# Patient Record
Sex: Female | Born: 1960
Health system: Southern US, Community
[De-identification: ages and names within clinical notes are randomized; demographics above are authoritative.]

## PROBLEM LIST (undated history)

## (undated) DIAGNOSIS — R569 Unspecified convulsions: Secondary | ICD-10-CM

## (undated) DIAGNOSIS — M419 Scoliosis, unspecified: Secondary | ICD-10-CM

## (undated) DIAGNOSIS — I639 Cerebral infarction, unspecified: Secondary | ICD-10-CM

## (undated) DIAGNOSIS — T7840XA Allergy, unspecified, initial encounter: Secondary | ICD-10-CM

## (undated) DIAGNOSIS — E039 Hypothyroidism, unspecified: Secondary | ICD-10-CM

## (undated) DIAGNOSIS — S92901A Unspecified fracture of right foot, initial encounter for closed fracture: Secondary | ICD-10-CM

## (undated) DIAGNOSIS — F329 Major depressive disorder, single episode, unspecified: Secondary | ICD-10-CM

## (undated) DIAGNOSIS — R011 Cardiac murmur, unspecified: Secondary | ICD-10-CM

## (undated) DIAGNOSIS — I77810 Thoracic aortic ectasia: Secondary | ICD-10-CM

## (undated) DIAGNOSIS — F419 Anxiety disorder, unspecified: Secondary | ICD-10-CM

## (undated) DIAGNOSIS — F32A Depression, unspecified: Secondary | ICD-10-CM

## (undated) DIAGNOSIS — G8929 Other chronic pain: Secondary | ICD-10-CM

## (undated) DIAGNOSIS — M1990 Unspecified osteoarthritis, unspecified site: Secondary | ICD-10-CM

## (undated) DIAGNOSIS — J45909 Unspecified asthma, uncomplicated: Secondary | ICD-10-CM

## (undated) DIAGNOSIS — E7800 Pure hypercholesterolemia, unspecified: Secondary | ICD-10-CM

## (undated) DIAGNOSIS — I10 Essential (primary) hypertension: Secondary | ICD-10-CM

## (undated) DIAGNOSIS — IMO0002 Reserved for concepts with insufficient information to code with codable children: Secondary | ICD-10-CM

## (undated) DIAGNOSIS — M719 Bursopathy, unspecified: Secondary | ICD-10-CM

## (undated) DIAGNOSIS — N289 Disorder of kidney and ureter, unspecified: Secondary | ICD-10-CM

## (undated) DIAGNOSIS — M549 Dorsalgia, unspecified: Secondary | ICD-10-CM

## (undated) DIAGNOSIS — D649 Anemia, unspecified: Secondary | ICD-10-CM

## (undated) DIAGNOSIS — I209 Angina pectoris, unspecified: Secondary | ICD-10-CM

## (undated) HISTORY — DX: Unspecified osteoarthritis, unspecified site: M19.90

## (undated) HISTORY — DX: Bursopathy, unspecified: M71.9

## (undated) HISTORY — DX: Cardiac murmur, unspecified: R01.1

## (undated) HISTORY — DX: Anxiety disorder, unspecified: F41.9

## (undated) HISTORY — PX: ABDOMINAL HYSTERECTOMY: SHX81

## (undated) HISTORY — DX: Allergy, unspecified, initial encounter: T78.40XA

## (undated) HISTORY — DX: Anemia, unspecified: D64.9

## (undated) HISTORY — DX: Thoracic aortic ectasia: I77.810

## (undated) HISTORY — PX: HAND SURGERY: SHX662

## (undated) HISTORY — PX: OTHER SURGICAL HISTORY: SHX169

---

## 1998-02-15 ENCOUNTER — Encounter: Payer: Self-pay | Admitting: Emergency Medicine

## 1998-02-15 ENCOUNTER — Emergency Department (HOSPITAL_COMMUNITY): Admission: EM | Admit: 1998-02-15 | Discharge: 1998-02-15 | Payer: Self-pay | Admitting: Emergency Medicine

## 1998-03-14 ENCOUNTER — Emergency Department (HOSPITAL_COMMUNITY): Admission: EM | Admit: 1998-03-14 | Discharge: 1998-03-14 | Payer: Self-pay | Admitting: Emergency Medicine

## 1998-03-18 ENCOUNTER — Emergency Department (HOSPITAL_COMMUNITY): Admission: EM | Admit: 1998-03-18 | Discharge: 1998-03-18 | Payer: Self-pay

## 1998-08-25 ENCOUNTER — Emergency Department (HOSPITAL_COMMUNITY): Admission: EM | Admit: 1998-08-25 | Discharge: 1998-08-25 | Payer: Self-pay | Admitting: Emergency Medicine

## 1998-08-25 ENCOUNTER — Encounter: Payer: Self-pay | Admitting: Emergency Medicine

## 1998-11-10 ENCOUNTER — Ambulatory Visit (HOSPITAL_COMMUNITY): Admission: RE | Admit: 1998-11-10 | Discharge: 1998-11-10 | Payer: Self-pay | Admitting: Gastroenterology

## 1998-11-16 ENCOUNTER — Other Ambulatory Visit: Admission: RE | Admit: 1998-11-16 | Discharge: 1998-11-16 | Payer: Self-pay | Admitting: *Deleted

## 1999-02-05 ENCOUNTER — Encounter: Payer: Self-pay | Admitting: Emergency Medicine

## 1999-02-05 ENCOUNTER — Emergency Department (HOSPITAL_COMMUNITY): Admission: EM | Admit: 1999-02-05 | Discharge: 1999-02-05 | Payer: Self-pay | Admitting: Emergency Medicine

## 1999-12-17 ENCOUNTER — Other Ambulatory Visit: Admission: RE | Admit: 1999-12-17 | Discharge: 1999-12-17 | Payer: Self-pay | Admitting: Obstetrics

## 1999-12-17 ENCOUNTER — Encounter (HOSPITAL_COMMUNITY): Admission: AD | Admit: 1999-12-17 | Discharge: 2000-03-16 | Payer: Self-pay | Admitting: Obstetrics

## 1999-12-18 ENCOUNTER — Other Ambulatory Visit: Admission: RE | Admit: 1999-12-18 | Discharge: 1999-12-18 | Payer: Self-pay | Admitting: Obstetrics

## 1999-12-18 ENCOUNTER — Encounter (INDEPENDENT_AMBULATORY_CARE_PROVIDER_SITE_OTHER): Payer: Self-pay | Admitting: Specialist

## 1999-12-20 ENCOUNTER — Encounter: Payer: Self-pay | Admitting: Obstetrics

## 1999-12-20 ENCOUNTER — Ambulatory Visit (HOSPITAL_COMMUNITY): Admission: RE | Admit: 1999-12-20 | Discharge: 1999-12-20 | Payer: Self-pay | Admitting: Obstetrics

## 2000-03-17 ENCOUNTER — Encounter (HOSPITAL_COMMUNITY): Admission: AD | Admit: 2000-03-17 | Discharge: 2000-06-15 | Payer: Self-pay | Admitting: Obstetrics

## 2000-07-29 ENCOUNTER — Emergency Department (HOSPITAL_COMMUNITY): Admission: EM | Admit: 2000-07-29 | Discharge: 2000-07-29 | Payer: Self-pay | Admitting: Emergency Medicine

## 2000-07-29 ENCOUNTER — Encounter: Payer: Self-pay | Admitting: Emergency Medicine

## 2000-11-11 ENCOUNTER — Encounter: Payer: Self-pay | Admitting: Family Medicine

## 2000-11-11 ENCOUNTER — Encounter: Admission: RE | Admit: 2000-11-11 | Discharge: 2000-11-11 | Payer: Self-pay | Admitting: Family Medicine

## 2000-12-24 ENCOUNTER — Encounter: Admission: RE | Admit: 2000-12-24 | Discharge: 2001-02-06 | Payer: Self-pay | Admitting: Orthopedic Surgery

## 2001-01-05 ENCOUNTER — Other Ambulatory Visit: Admission: RE | Admit: 2001-01-05 | Discharge: 2001-01-05 | Payer: Self-pay | Admitting: Obstetrics and Gynecology

## 2001-02-06 ENCOUNTER — Encounter: Payer: Self-pay | Admitting: *Deleted

## 2001-02-06 ENCOUNTER — Ambulatory Visit (HOSPITAL_COMMUNITY): Admission: RE | Admit: 2001-02-06 | Discharge: 2001-02-06 | Payer: Self-pay | Admitting: *Deleted

## 2001-03-05 ENCOUNTER — Encounter: Payer: Self-pay | Admitting: Emergency Medicine

## 2001-03-05 ENCOUNTER — Emergency Department (HOSPITAL_COMMUNITY): Admission: EM | Admit: 2001-03-05 | Discharge: 2001-03-05 | Payer: Self-pay | Admitting: Emergency Medicine

## 2001-09-25 ENCOUNTER — Emergency Department (HOSPITAL_COMMUNITY): Admission: EM | Admit: 2001-09-25 | Discharge: 2001-09-26 | Payer: Self-pay | Admitting: Emergency Medicine

## 2001-11-20 ENCOUNTER — Emergency Department (HOSPITAL_COMMUNITY): Admission: EM | Admit: 2001-11-20 | Discharge: 2001-11-20 | Payer: Self-pay | Admitting: Emergency Medicine

## 2001-11-26 ENCOUNTER — Emergency Department (HOSPITAL_COMMUNITY): Admission: EM | Admit: 2001-11-26 | Discharge: 2001-11-26 | Payer: Self-pay | Admitting: Emergency Medicine

## 2001-12-10 ENCOUNTER — Encounter: Admission: RE | Admit: 2001-12-10 | Discharge: 2001-12-23 | Payer: Self-pay | Admitting: Orthopedic Surgery

## 2002-01-23 ENCOUNTER — Encounter: Payer: Self-pay | Admitting: Emergency Medicine

## 2002-01-23 ENCOUNTER — Emergency Department (HOSPITAL_COMMUNITY): Admission: EM | Admit: 2002-01-23 | Discharge: 2002-01-24 | Payer: Self-pay | Admitting: Emergency Medicine

## 2002-02-04 ENCOUNTER — Other Ambulatory Visit: Admission: RE | Admit: 2002-02-04 | Discharge: 2002-02-04 | Payer: Self-pay | Admitting: Obstetrics and Gynecology

## 2002-03-04 ENCOUNTER — Encounter: Payer: Self-pay | Admitting: Internal Medicine

## 2002-03-04 ENCOUNTER — Ambulatory Visit (HOSPITAL_COMMUNITY): Admission: RE | Admit: 2002-03-04 | Discharge: 2002-03-04 | Payer: Self-pay | Admitting: Internal Medicine

## 2002-04-05 ENCOUNTER — Emergency Department (HOSPITAL_COMMUNITY): Admission: EM | Admit: 2002-04-05 | Discharge: 2002-04-06 | Payer: Self-pay | Admitting: Emergency Medicine

## 2002-04-07 ENCOUNTER — Inpatient Hospital Stay (HOSPITAL_COMMUNITY): Admission: AD | Admit: 2002-04-07 | Discharge: 2002-04-07 | Payer: Self-pay | Admitting: Obstetrics and Gynecology

## 2002-04-09 ENCOUNTER — Emergency Department (HOSPITAL_COMMUNITY): Admission: EM | Admit: 2002-04-09 | Discharge: 2002-04-09 | Payer: Self-pay | Admitting: Emergency Medicine

## 2002-04-12 ENCOUNTER — Ambulatory Visit (HOSPITAL_COMMUNITY): Admission: RE | Admit: 2002-04-12 | Discharge: 2002-04-12 | Payer: Self-pay | Admitting: Obstetrics and Gynecology

## 2002-04-12 ENCOUNTER — Encounter: Payer: Self-pay | Admitting: Obstetrics and Gynecology

## 2002-05-11 ENCOUNTER — Encounter (INDEPENDENT_AMBULATORY_CARE_PROVIDER_SITE_OTHER): Payer: Self-pay | Admitting: *Deleted

## 2002-05-11 ENCOUNTER — Inpatient Hospital Stay (HOSPITAL_COMMUNITY): Admission: RE | Admit: 2002-05-11 | Discharge: 2002-05-14 | Payer: Self-pay | Admitting: Obstetrics and Gynecology

## 2002-05-11 ENCOUNTER — Encounter: Payer: Self-pay | Admitting: Obstetrics and Gynecology

## 2002-08-24 ENCOUNTER — Ambulatory Visit (HOSPITAL_COMMUNITY): Admission: RE | Admit: 2002-08-24 | Discharge: 2002-08-24 | Payer: Self-pay | Admitting: Obstetrics and Gynecology

## 2002-08-24 ENCOUNTER — Encounter: Payer: Self-pay | Admitting: Obstetrics and Gynecology

## 2003-06-02 ENCOUNTER — Other Ambulatory Visit: Admission: RE | Admit: 2003-06-02 | Discharge: 2003-06-02 | Payer: Self-pay | Admitting: Obstetrics and Gynecology

## 2008-09-29 ENCOUNTER — Emergency Department (HOSPITAL_COMMUNITY): Admission: EM | Admit: 2008-09-29 | Discharge: 2008-09-29 | Payer: Self-pay | Admitting: Emergency Medicine

## 2008-10-10 ENCOUNTER — Emergency Department (HOSPITAL_COMMUNITY): Admission: EM | Admit: 2008-10-10 | Discharge: 2008-10-10 | Payer: Self-pay | Admitting: Family Medicine

## 2009-02-18 ENCOUNTER — Emergency Department (HOSPITAL_COMMUNITY): Admission: EM | Admit: 2009-02-18 | Discharge: 2009-02-18 | Payer: Self-pay | Admitting: Family Medicine

## 2010-07-08 LAB — GLUCOSE, CAPILLARY

## 2010-08-17 NOTE — Op Note (Signed)
NAME:  Lori Jones, Lori Jones                         ACCOUNT NO.:  [id number]   MEDICAL RECORD NO.:  [id number]                   PATIENT TYPE:  OBV   LOCATION:  9303                                 [facility]   PHYSICIAN:  Hal Morales, M.D.             DATE OF BIRTH:  December 31, 1960   DATE OF PROCEDURE:  05/11/2002  DATE OF DISCHARGE:                                 OPERATIVE REPORT   PREOPERATIVE DIAGNOSES:  1. Uterine fibroids.  2. Menorrhagia.  3. Pelvic pain.  4. Anemia.  5. Bilateral pelvic kidneys.   POSTOPERATIVE DIAGNOSES:  1. Uterine fibroids.  2. Pelvic pain.  3. Menorrhagia.  4. Anemia.  5. Endometriosis.  6. Bilateral pelvic kidneys.   OPERATION:  Laparoscopy and total abdominal hysterectomy after placement of  ureteral stents, then removal of ureteral stents.   SURGEON:  Hal Morales, M.D.   FIRST ASSISTANT:  Elmira J. Lowell Guitar, P.A.   UROLOGIC CONSULTANT:  Lindaann Slough, M.D.   ANESTHESIA:  General orotracheal.   ESTIMATED BLOOD LOSS:  400 mL.   COMPLICATIONS:  None.   FINDINGS:  The uterus was enlarged to approximately 14 weeks size with  multiple myomata.  The ovaries were normal sized bilaterally but contained  powder burn lesions consistent with endometriosis.  The anterior cul-de-sac  likewise contained powder burn lesions consistent with endometriosis.   DESCRIPTION OF PROCEDURE:  The patient was taken to the operating room after  appropriate identification and placed on the operating table.  While she was  awake she was placed in the modified lithotomy position because of a history  of significant back pain.  She was then placed under general anesthesia and  the abdomen, perineum, and vagina prepped with multiple layers of Betadine.  The perineum was then draped as a sterile field and Dr. Su Grand undertook  cystoscopy and cystoscopic placement of ureteral catheters.  Once the  ureteral catheters had been placed a Foley catheter was  placed and a Hulka  tenaculum placed on the cervix.  The abdomen and perineum were then  re-draped in an LAVH drape and subumbilical injection of 0.25% Marcaine as  well as suprapubic injection on either side of midline undertaken for a  total of 10 mL.  A subumbilical incision was made and a Veress cannula  placed through that incision into the peritoneal cavity.  A pneumoperitoneum  was created with 3 liters of CO2.  The Veress cannula was removed and the  laparoscopic trocar placed through that incision into the peritoneal cavity  and the laparoscope placed through the trocar sleeve.  A suprapubic incision  was made to the left of midline and laparoscopic probe trocar placed through  that incision into the peritoneal cavity under direct visualization.  The  above-noted findings were made and documented.  The decision was made to  proceed with abdominal hysterectomy.  The laparoscope and trocar were  removed under  direct visualization as the CO2 was allowed to escape and the  subumbilical incision closed with a subcuticular suture of 3-0 Vicryl.   The patient's legs were put down and she was placed in the supine position  and re-draped, then a suprapubic incision encompassing the left suprapubic  incision from laparoscopy was made and the abdomen opened in layers.  The  peritoneum was entered.  A self-retaining O'Connor-O'Sullivan retractor was  placed in the incision and the bowel packed cephalad.  The left round  ligament was identified, suture ligated, then incised and that incision  taken on the anterior leaf of the broad ligament.  The uteroovarian ligament  was isolated, clamped, cut, and suture ligated.  A similar procedure was  carried out with the round ligament and the uteroovarian ligaments on the  right side.  The anterior peritoneum overlying the anterior uterus and  cervix was then incised to meet on either side and the bladder deflected off  the anterior cervix with a  combination of blunt and sharp dissection.  The  uterine arteries, parametrial, and paracervical tissues were then  successively clamped, cut, and suture ligated.  The uterus and fibroids were  then excised from the cervix and removed from the operative field.  The  paracervical tissues were then clamped, cut, and suture ligated down to the  level of the uterosacral ligaments where the sutures were held.  The vaginal  angles were then clamped, cut, and suture ligated and the cervix removed  from the operative field.  The vaginal angle sutures were likewise held.  The remainder of the vaginal cuff was closed with a figure-of-eight suture,  with all sutures to this point being 0 Vicryl.  Copious irrigation was  carried out and a hemostatic suture placed in the right vaginal cuff area to  allow for adequate hemostasis.  Copious irrigation was carried out and all  instruments removed from the peritoneal cavity as well as sponges.  The  powder burn lesions on the right and left ovaries were then cauterized.  The  abdominal peritoneum was then closed with a running suture of 2-0 Vicryl.  The rectus muscles were made hemostatic with Bovie cautery and irrigated.  The rectus fascia was closed with running sutures of 0 Vicryl from each apex  to the midline and tied in the midline.  The subcutaneous tissue was  irrigated and made hemostatic with Bovie cautery.  Skin staples were applied  to the skin incision.  A sterile dressing was applied.   SPECIMENS TO PATHOLOGY:  Uterus with fibroids and cervix as a separate  specimen.  Also, prior to removing the instruments from the peritoneal  cavity at laparoscopy, a peritoneal biopsy for endometriosis had been  obtained and that was likewise sent to pathology.                                               Hal Morales, M.D.    VPH/MEDQ  D:  05/11/2002  T:  05/11/2002  Job:  [id number]

## 2010-08-17 NOTE — Op Note (Signed)
   NAME:  Lori Jones, Lori Jones                         ACCOUNT NO.:  [id number]   MEDICAL RECORD NO.:  [id number]                   PATIENT TYPE:  OBV   LOCATION:  9303                                 [facility]   PHYSICIAN:  Lindaann Slough, M.D.               DATE OF BIRTH:  [birth year]   DATE OF PROCEDURE:  05/11/2002  DATE OF DISCHARGE:                                 OPERATIVE REPORT   PREOPERATIVE DIAGNOSIS:  Bilateral pelvic kidneys.   POSTOPERATIVE DIAGNOSIS:  Bilateral pelvic kidneys.   PROCEDURE:  Cystoscopy, bilateral retrograde pyelogram and insertion of  ureteral catheters.   SURGEON:  Lindaann Slough, M.D.   ANESTHESIA:  General anesthesia.   INDICATIONS FOR PROCEDURE:  The patient is a 50 year old female who is  scheduled for laparoscopic assisted vaginal hysterectomy.  She was found on  CT scan to have bilateral pelvic kidneys.  She is scheduled for cystoscopy,  retrogram pyelogram and insertion of bilateral ureteral catheters.   DESCRIPTION OF PROCEDURE:  Under general anesthesia, the patient was prepped  and draped and placed in the dorsal lithotomy position.  A #23 cystoscope  was inserted in the bladder.  The bladder mucosa is normal.  There is no  stone or tumor in the bladder.  The ureteral orifices are in normal position  and shape with clear efflux.  A cone tipped catheter was then passed through  the left ureteral orifice and contrast was injected through the ureteral  catheter.  The ureter is normal and the renal pelvis is in the bony pelvis  at the SI joint.  The cone tipped catheter was then removed.  A #6 French  ureteral catheter was passed through the cystoscope and into the left ureter  up into the renal pelvis.  Then the same procedure was done on the right  side.  The renal pelvis on the right side is in the lower position in the  pelvis.  The cone tipped catheter was removed.  A #6 French ureteral  catheter was passed through the cystoscope and  into the ureter.  The  cystoscope was then removed.  A #16 French Foley catheter was then inserted  in the bladder.   The patient was then left in the dorsal lithotomy position for the procedure  by Dr. Pennie Rushing.                                               Lindaann Slough, M.D.    MN/MEDQ  D:  05/11/2002  T:  05/11/2002  Job:  [id number]

## 2010-08-17 NOTE — Discharge Summary (Signed)
NAME:  Lori Jones, Lori Jones                         ACCOUNT NO.:  [id number]   MEDICAL RECORD NO.:  [id number]                   PATIENT TYPE:  INP   LOCATION:  9303                                 [facility]   PHYSICIAN:  Hal Morales, M.D.             DATE OF BIRTH:  Mar 04, 1961   DATE OF ADMISSION:  05/11/2002  DATE OF DISCHARGE:  05/14/2002                                 DISCHARGE SUMMARY   DISCHARGE DIAGNOSES:  1. Menorrhagia.  2. History of anemia (hemoglobin as low as 6.0).  3. Fibroid uterus.  4. Uterine enlargement out of proportion to size of fibroids.  5. Pelvic kidneys.  6. Endometriosis.   OPERATION:  On the date of admission the patient underwent laparoscopy, a  total abdominal hysterectomy, ureteral catheterization with stent placement,  and peritoneal biopsy, tolerating all procedures well.  The patient was  found to have a fibroid uterus approximately 14 week size weighing 413 grams  accompanied by pelvic implants which were consistent with endometriosis;  normal-appearing tubes and ovaries; and patent ureters.   HISTORY OF PRESENT ILLNESS:  The patient is a 50 year old African-American  female para 1-0-0-1 with a history of uterine fibroids who presents for  hysterectomy because of increasing menorrhagia, severe dysmenorrhea, and  anemia with hemoglobin as low as 6.0.  Please see the patient's dictated  History and Physical Examination for details.   PHYSICAL EXAMINATION:  VITAL SIGNS:  Blood pressure 110/80, weight 179.5,  height 5 feet 8 inches tall.  GENERAL:  Within normal limits; however, do note that abdomen showed  positive bowel sounds.  It was soft with diffuse tenderness but no guarding,  rebound, or organomegaly.  PELVIC:  EG/BUS was within normal limits.  Vagina was rugous.  Cervix was  nontender without lesions.  Uterus was 12-14 week size, tender, and mobile.  Adnexa without tenderness or masses.  Rectovaginal exam was without  tenderness  or masses.   HOSPITAL COURSE:  On the date of admission the patient underwent the  aforementioned procedures, tolerating them all well.  The patient's  postoperative course was marked by a maximum temperature of 100.8 degrees on  postoperative day #1; however, this quickly defervesced.  The patient also  complained of left lower extremity numbness; however, the patient does have  a history of degenerative disk disease in her lumbar spine and has had this  same complaint/symptom since being diagnosed with this problem.  By  postoperative day #3 the patient had resumed bowel and bladder function as  well as a defervescence of her fever, and was therefore deemed ready for  discharge home.  Postoperative hemoglobin 10.2 (preoperative hemoglobin  13.1).   DISCHARGE MEDICATIONS:  1. Iron 325 mg one tablet twice daily for six weeks.  2. Colace 100 mg one tablet twice daily until bowel movements are regular.  3. Ibuprofen 600 mg one tablet with food q.6h. for five days then  as needed     for pain.  4. Tylox one to two tablets q.4-6h. as needed for pain.  5. Phenergan 12.5 mg one tablet q.6h. as needed for nausea.    FOLLOW-UP:  1. The patient is scheduled for a staple removal at [facility]     on April 16, 2002 at 10:30 a.m.  2. She has a six weeks postoperative exam with Dr. Pennie Rushing on June 23, 2002     at 2:30 p.m.   DISCHARGE INSTRUCTIONS:  1. The patient was given a copy of [facility] postoperative     instruction sheet.  2. She was further advised to avoid driving for two weeks, heavy lifting for     four weeks, intercourse for six weeks.  3. Diet was without restrictions.   FINAL PATHOLOGY:  Peritoneal biopsy:  Findings consistent with  endometriosis; no evidence of malignancy.  Uterus excision:  Benign  endometrium without hyperplasia or evidence of malignancy.  Cellular  leiomyoma at least 2 cm.  Benign leiomyomas with focal infarction and   fibrosis.  Serosa with no pathologic abnormalities.  Benign cervix with  nabothian cyst and mild cervicitis; no dysplasia identified.     Lori Jones.                    Hal Morales, M.D.    EJP/MEDQ  D:  06/04/2002  T:  06/05/2002  Job:  [id number]

## 2010-08-17 NOTE — H&P (Signed)
NAME:  Lori Jones, Lori Jones                         ACCOUNT NO.:  [id number]   MEDICAL RECORD NO.:  [id number]                   PATIENT TYPE:  AMB   LOCATION:  SDC                                  [facility]   PHYSICIAN:  Hal Morales, M.D.             DATE OF BIRTH:  Jul 27, 1960   DATE OF ADMISSION:  DATE OF DISCHARGE:                                HISTORY & PHYSICAL   HISTORY OF PRESENT ILLNESS:  This patient is a 50 year old, single, African-  American female, para 1-0-0-1 with a history of uterine fibroids who  presents for hysterectomy, because of increasing menorrhagia, severe  dysmenorrhea and anemia (hemoglobin as low as 6.0).  Over the past 2 years  the patient's menses have increased from 7 to 10 days; in which she changes  her super tampons plus a super pad every 45 minutes.  Additionally she  endures a 10/10 crampy pain (on a 10-point scale) which initially responded  to nonsteroidal antiinflammatory medications, but after a few months did  not.  In October of 2002 the patient had an endometrial biopsy which showed  benign, early secretory phase endometrium and in August 2003 a normal TSH.  The patient was given a trial of birth control pills for her symptoms, but  discontinued this due to weight gain.  In October 2003 Depo-Provera was  given to the patient to manage her heavy bleeding.  However, as expected it  caused intermittent spotting which finally culminated in a heavy vaginal  bleed with severe cramping.  As a result of this in January 2004, the  patient was seen at [facility] and was placed on  Provera 40 mg/day which stopped her bleeding and tramadol for pain.  An MRI  of the patient's pelvis April 12, 2002 revealed a globular enlargement of  her uterus with multiple intramural fibroids distorting the endometrium.  The fibroid measured up to 4 cm in diameter.  The patient also has bilateral  pelvic kidneys the more superior kidney  being positioned in the left side of  the false pelvis anterior to the lumbosacral junction and the left psoas  muscle.  The more inferior kidney is positioned posteriorly in the lower  right aspect of the pelvis between the enlarged uterus and the sacrum  extending toward the sciatic notch.   The patient has considered all of the medical along with surgical management  options made available to her and has decided to undergo a hysterectomy for  definitive management of her symptoms.   OBSTETRICAL HISTORY:  Gravida 1 para 1; patient had a cesarean section for  this pregnancy and also experienced postpartum depression.   GYNECOLOGICAL HISTORY:  Menarche 15 years ago.  This patient's menstrual  periods are monthly--see history of present illness.  She uses abstinence as  her method of contraception. She denies any history of sexually transmitted  diseases or abnormal Pap smears.  The patient had a normal Pap smear and a  normal mammogram in November 2003.   PAST MEDICAL HISTORY:  Degenerative disk disease in both neck and lower  back.  Depression, pelvic kidneys, ovarian cysts, asthma, epilepsy,  scoliosis (lumbar), and a positive ANA (no diagnosis, however, of lupus).   PAST SURGICAL HISTORY:  In 1984 cesarean section, 1987 D&C.  The patient had  a blood transfusion in 1987 secondary to menorrhagia.  She denies any  problems with anesthesia.   FAMILY HISTORY:  Family history is positive for heart disease, hypertension,  insulin-dependent diabetic, and depression.   SOCIAL HISTORY:  The patient is single.  She formerly worked as a Armed forces operational officer; however, she is currently out of work due to her neck and back  disability.   CURRENT MEDICATIONS:  1. Tramadol 50 mg daily.  2. Norflex 100 mg at bedtime.  3. Flexeril 10 mg 3 times daily as needed.  4. Provera 10 mg 4 tablets daily.  5. Paxil 37.5 mg daily.  6. Doxepin 50 mg daily.  7. Iron 1 tablet daily.  8. Tylenol No. 31-2  tablets q.4-6h. as needed.  9. Motrin 800 mg 1 tablet q.8h. with food as needed.   ALLERGIES:  [name] which causes rash and hallucinations and Imitrex give  palpitations with chest pain.   HABITS:  The patient does not use tobacco or alcohol.   REVIEW OF SYSTEMS:  The patient occasionally has headaches, occasional  allergies.  Otherwise they are negative except as mentioned in history of  present illness.   PHYSICAL EXAMINATION:  VITAL SIGNS:  Blood pressure is 110/80.  Weight is  179-1/2.  Height 5 feet 8 inches tall.  NECK:  Supple without masses.  There is no thyromegaly or adenopathy.  HEART:  Regular rate and rhythm.  There are no murmurs.  LUNGS:  Clear to auscultation.  There are no wheezes, rales or rhonchi.  BACK:  No CVA tenderness.  ABDOMEN:  Bowel sounds are present.  It is soft with diffuse tenderness, but  no guarding, rebound or organomegaly.  EXTREMITIES:  Without clubbing, cyanosis, or edema.  PELVIC:  EGBUS is within normal limits.  Vagina is rugous.  Cervix is  nontender without lesions.  Uterus is 12-14 weeks size, tender, and mobile.  Adnexa without tenderness or masses.  RECTOVAGINAL:  Without tenderness or masses.   IMPRESSION:  1. Menorrhagia.  2. History of anemia, hemoglobin as low as 6.0.  3. Fibroid uterus.  4. Uterine enlargement out of proportion to size of fibroid.   DISPOSITION:  A long conversation was held with patient regarding her  options for management to include uterine artery embolization, Lupron-Depo  myomectomy and hysterectomy.  Additionally the risks and benefits of each  were reviewed and that since the a possibility of  adenomyosis exists a  myomectomy procedure would not necessarily alleviate her bleeding symptoms  or pain.  The patient has consented to undergo a laparoscopically assisted  vaginal hysterectomy and understands the implications for this procedure along with its risks include but are not limited to reaction to  anesthesia,  damage to adjacent organs, bleeding, infection, the possibility that  hysterectomy cannot be  done vaginally and, therefore, the need for an abdominal incision may be a  possibility.  The patient also agrees and understands that her ovaries may  need to be removed if they appear abnormal.  The patient is scheduled to  undergo a laparoscopically assisted vaginal hysterectomy at [facility], [place], on May 11, 2002 at 9:15 a.m.     Lori Jones.                    Hal Morales, M.D.    EJP/MEDQ  D:  05/06/2002  T:  05/06/2002  Job:  [id number]

## 2011-04-03 DIAGNOSIS — M629 Disorder of muscle, unspecified: Secondary | ICD-10-CM | POA: Diagnosis not present

## 2011-04-03 DIAGNOSIS — M545 Low back pain, unspecified: Secondary | ICD-10-CM | POA: Diagnosis not present

## 2011-04-03 DIAGNOSIS — M5030 Other cervical disc degeneration, unspecified cervical region: Secondary | ICD-10-CM | POA: Diagnosis not present

## 2011-04-03 DIAGNOSIS — M542 Cervicalgia: Secondary | ICD-10-CM | POA: Diagnosis not present

## 2011-04-03 DIAGNOSIS — M4120 Other idiopathic scoliosis, site unspecified: Secondary | ICD-10-CM | POA: Diagnosis not present

## 2011-04-03 DIAGNOSIS — M2420 Disorder of ligament, unspecified site: Secondary | ICD-10-CM | POA: Diagnosis not present

## 2011-04-03 DIAGNOSIS — R293 Abnormal posture: Secondary | ICD-10-CM | POA: Diagnosis not present

## 2011-04-03 DIAGNOSIS — IMO0001 Reserved for inherently not codable concepts without codable children: Secondary | ICD-10-CM | POA: Diagnosis not present

## 2011-04-03 DIAGNOSIS — F259 Schizoaffective disorder, unspecified: Secondary | ICD-10-CM | POA: Diagnosis not present

## 2011-04-03 DIAGNOSIS — M549 Dorsalgia, unspecified: Secondary | ICD-10-CM | POA: Diagnosis not present

## 2011-04-05 DIAGNOSIS — M545 Low back pain, unspecified: Secondary | ICD-10-CM | POA: Diagnosis not present

## 2011-04-05 DIAGNOSIS — M542 Cervicalgia: Secondary | ICD-10-CM | POA: Diagnosis not present

## 2011-04-05 DIAGNOSIS — M629 Disorder of muscle, unspecified: Secondary | ICD-10-CM | POA: Diagnosis not present

## 2011-04-05 DIAGNOSIS — R293 Abnormal posture: Secondary | ICD-10-CM | POA: Diagnosis not present

## 2011-04-05 DIAGNOSIS — M2420 Disorder of ligament, unspecified site: Secondary | ICD-10-CM | POA: Diagnosis not present

## 2011-04-05 DIAGNOSIS — M549 Dorsalgia, unspecified: Secondary | ICD-10-CM | POA: Diagnosis not present

## 2011-04-05 DIAGNOSIS — IMO0001 Reserved for inherently not codable concepts without codable children: Secondary | ICD-10-CM | POA: Diagnosis not present

## 2011-04-08 DIAGNOSIS — Z1231 Encounter for screening mammogram for malignant neoplasm of breast: Secondary | ICD-10-CM | POA: Diagnosis not present

## 2011-04-09 DIAGNOSIS — M629 Disorder of muscle, unspecified: Secondary | ICD-10-CM | POA: Diagnosis not present

## 2011-04-09 DIAGNOSIS — R293 Abnormal posture: Secondary | ICD-10-CM | POA: Diagnosis not present

## 2011-04-09 DIAGNOSIS — IMO0001 Reserved for inherently not codable concepts without codable children: Secondary | ICD-10-CM | POA: Diagnosis not present

## 2011-04-09 DIAGNOSIS — M2420 Disorder of ligament, unspecified site: Secondary | ICD-10-CM | POA: Diagnosis not present

## 2011-04-09 DIAGNOSIS — M542 Cervicalgia: Secondary | ICD-10-CM | POA: Diagnosis not present

## 2011-04-09 DIAGNOSIS — M549 Dorsalgia, unspecified: Secondary | ICD-10-CM | POA: Diagnosis not present

## 2011-04-09 DIAGNOSIS — M545 Low back pain, unspecified: Secondary | ICD-10-CM | POA: Diagnosis not present

## 2011-04-11 DIAGNOSIS — IMO0001 Reserved for inherently not codable concepts without codable children: Secondary | ICD-10-CM | POA: Diagnosis not present

## 2011-04-11 DIAGNOSIS — M545 Low back pain, unspecified: Secondary | ICD-10-CM | POA: Diagnosis not present

## 2011-04-11 DIAGNOSIS — M629 Disorder of muscle, unspecified: Secondary | ICD-10-CM | POA: Diagnosis not present

## 2011-04-11 DIAGNOSIS — M2420 Disorder of ligament, unspecified site: Secondary | ICD-10-CM | POA: Diagnosis not present

## 2011-04-11 DIAGNOSIS — R293 Abnormal posture: Secondary | ICD-10-CM | POA: Diagnosis not present

## 2011-04-11 DIAGNOSIS — M549 Dorsalgia, unspecified: Secondary | ICD-10-CM | POA: Diagnosis not present

## 2011-04-11 DIAGNOSIS — M542 Cervicalgia: Secondary | ICD-10-CM | POA: Diagnosis not present

## 2011-04-15 DIAGNOSIS — F259 Schizoaffective disorder, unspecified: Secondary | ICD-10-CM | POA: Diagnosis not present

## 2011-04-16 DIAGNOSIS — R293 Abnormal posture: Secondary | ICD-10-CM | POA: Diagnosis not present

## 2011-04-16 DIAGNOSIS — M542 Cervicalgia: Secondary | ICD-10-CM | POA: Diagnosis not present

## 2011-04-16 DIAGNOSIS — M545 Low back pain, unspecified: Secondary | ICD-10-CM | POA: Diagnosis not present

## 2011-04-16 DIAGNOSIS — M549 Dorsalgia, unspecified: Secondary | ICD-10-CM | POA: Diagnosis not present

## 2011-04-16 DIAGNOSIS — IMO0001 Reserved for inherently not codable concepts without codable children: Secondary | ICD-10-CM | POA: Diagnosis not present

## 2011-04-16 DIAGNOSIS — M629 Disorder of muscle, unspecified: Secondary | ICD-10-CM | POA: Diagnosis not present

## 2011-04-16 DIAGNOSIS — M2420 Disorder of ligament, unspecified site: Secondary | ICD-10-CM | POA: Diagnosis not present

## 2011-04-25 DIAGNOSIS — F259 Schizoaffective disorder, unspecified: Secondary | ICD-10-CM | POA: Diagnosis not present

## 2011-04-30 DIAGNOSIS — F259 Schizoaffective disorder, unspecified: Secondary | ICD-10-CM | POA: Diagnosis not present

## 2011-05-09 DIAGNOSIS — F259 Schizoaffective disorder, unspecified: Secondary | ICD-10-CM | POA: Diagnosis not present

## 2011-05-16 DIAGNOSIS — F259 Schizoaffective disorder, unspecified: Secondary | ICD-10-CM | POA: Diagnosis not present

## 2011-05-23 DIAGNOSIS — F259 Schizoaffective disorder, unspecified: Secondary | ICD-10-CM | POA: Diagnosis not present

## 2011-05-24 DIAGNOSIS — K5900 Constipation, unspecified: Secondary | ICD-10-CM | POA: Diagnosis not present

## 2011-05-24 DIAGNOSIS — E119 Type 2 diabetes mellitus without complications: Secondary | ICD-10-CM | POA: Diagnosis not present

## 2011-05-24 DIAGNOSIS — E039 Hypothyroidism, unspecified: Secondary | ICD-10-CM | POA: Diagnosis not present

## 2011-05-24 DIAGNOSIS — K529 Noninfective gastroenteritis and colitis, unspecified: Secondary | ICD-10-CM | POA: Diagnosis not present

## 2011-05-24 DIAGNOSIS — R109 Unspecified abdominal pain: Secondary | ICD-10-CM | POA: Diagnosis not present

## 2011-05-24 DIAGNOSIS — R112 Nausea with vomiting, unspecified: Secondary | ICD-10-CM | POA: Diagnosis not present

## 2011-05-27 DIAGNOSIS — F259 Schizoaffective disorder, unspecified: Secondary | ICD-10-CM | POA: Diagnosis not present

## 2011-05-27 DIAGNOSIS — I259 Chronic ischemic heart disease, unspecified: Secondary | ICD-10-CM | POA: Diagnosis not present

## 2011-05-27 DIAGNOSIS — E119 Type 2 diabetes mellitus without complications: Secondary | ICD-10-CM | POA: Diagnosis not present

## 2011-05-27 DIAGNOSIS — E785 Hyperlipidemia, unspecified: Secondary | ICD-10-CM | POA: Diagnosis not present

## 2011-05-27 DIAGNOSIS — E669 Obesity, unspecified: Secondary | ICD-10-CM | POA: Diagnosis not present

## 2011-05-27 DIAGNOSIS — E559 Vitamin D deficiency, unspecified: Secondary | ICD-10-CM | POA: Diagnosis not present

## 2011-05-27 DIAGNOSIS — I10 Essential (primary) hypertension: Secondary | ICD-10-CM | POA: Diagnosis not present

## 2011-05-28 DIAGNOSIS — R109 Unspecified abdominal pain: Secondary | ICD-10-CM | POA: Diagnosis not present

## 2011-05-28 DIAGNOSIS — I10 Essential (primary) hypertension: Secondary | ICD-10-CM | POA: Diagnosis not present

## 2011-05-28 DIAGNOSIS — E119 Type 2 diabetes mellitus without complications: Secondary | ICD-10-CM | POA: Diagnosis not present

## 2011-05-28 DIAGNOSIS — E785 Hyperlipidemia, unspecified: Secondary | ICD-10-CM | POA: Diagnosis not present

## 2011-06-10 DIAGNOSIS — F259 Schizoaffective disorder, unspecified: Secondary | ICD-10-CM | POA: Diagnosis not present

## 2011-06-13 DIAGNOSIS — F259 Schizoaffective disorder, unspecified: Secondary | ICD-10-CM | POA: Diagnosis not present

## 2011-06-17 DIAGNOSIS — F259 Schizoaffective disorder, unspecified: Secondary | ICD-10-CM | POA: Diagnosis not present

## 2011-06-19 DIAGNOSIS — M542 Cervicalgia: Secondary | ICD-10-CM | POA: Diagnosis not present

## 2011-06-19 DIAGNOSIS — M545 Low back pain, unspecified: Secondary | ICD-10-CM | POA: Diagnosis not present

## 2011-06-19 DIAGNOSIS — M5030 Other cervical disc degeneration, unspecified cervical region: Secondary | ICD-10-CM | POA: Diagnosis not present

## 2011-06-19 DIAGNOSIS — M5430 Sciatica, unspecified side: Secondary | ICD-10-CM | POA: Diagnosis not present

## 2011-06-19 DIAGNOSIS — M5412 Radiculopathy, cervical region: Secondary | ICD-10-CM | POA: Diagnosis not present

## 2011-06-26 DIAGNOSIS — F259 Schizoaffective disorder, unspecified: Secondary | ICD-10-CM | POA: Diagnosis not present

## 2011-07-05 DIAGNOSIS — M5137 Other intervertebral disc degeneration, lumbosacral region: Secondary | ICD-10-CM | POA: Diagnosis not present

## 2011-07-05 DIAGNOSIS — M4120 Other idiopathic scoliosis, site unspecified: Secondary | ICD-10-CM | POA: Diagnosis not present

## 2011-07-05 DIAGNOSIS — M545 Low back pain, unspecified: Secondary | ICD-10-CM | POA: Diagnosis not present

## 2011-07-05 DIAGNOSIS — M542 Cervicalgia: Secondary | ICD-10-CM | POA: Diagnosis not present

## 2011-07-05 DIAGNOSIS — M5430 Sciatica, unspecified side: Secondary | ICD-10-CM | POA: Diagnosis not present

## 2011-07-05 DIAGNOSIS — M5412 Radiculopathy, cervical region: Secondary | ICD-10-CM | POA: Diagnosis not present

## 2011-07-24 DIAGNOSIS — F259 Schizoaffective disorder, unspecified: Secondary | ICD-10-CM | POA: Diagnosis not present

## 2011-07-31 ENCOUNTER — Encounter (HOSPITAL_COMMUNITY): Payer: Self-pay | Admitting: *Deleted

## 2011-07-31 ENCOUNTER — Emergency Department (INDEPENDENT_AMBULATORY_CARE_PROVIDER_SITE_OTHER)
Admission: EM | Admit: 2011-07-31 | Discharge: 2011-07-31 | Disposition: A | Discharge status: Home / Self Care | Payer: Medicare Other | Source: Home / Self Care | Attending: Emergency Medicine | Admitting: Emergency Medicine

## 2011-07-31 DIAGNOSIS — M65849 Other synovitis and tenosynovitis, unspecified hand: Secondary | ICD-10-CM | POA: Diagnosis not present

## 2011-07-31 DIAGNOSIS — M65839 Other synovitis and tenosynovitis, unspecified forearm: Secondary | ICD-10-CM | POA: Diagnosis not present

## 2011-07-31 DIAGNOSIS — G589 Mononeuropathy, unspecified: Secondary | ICD-10-CM | POA: Diagnosis not present

## 2011-07-31 DIAGNOSIS — M779 Enthesopathy, unspecified: Secondary | ICD-10-CM

## 2011-07-31 DIAGNOSIS — G629 Polyneuropathy, unspecified: Secondary | ICD-10-CM

## 2011-07-31 HISTORY — DX: Disorder of kidney and ureter, unspecified: N28.9

## 2011-07-31 HISTORY — DX: Hypothyroidism, unspecified: E03.9

## 2011-07-31 HISTORY — DX: Pure hypercholesterolemia, unspecified: E78.00

## 2011-07-31 HISTORY — DX: Other chronic pain: G89.29

## 2011-07-31 HISTORY — DX: Scoliosis, unspecified: M41.9

## 2011-07-31 HISTORY — DX: Dorsalgia, unspecified: M54.9

## 2011-07-31 HISTORY — DX: Reserved for concepts with insufficient information to code with codable children: IMO0002

## 2011-07-31 MED ORDER — HYDROCODONE-ACETAMINOPHEN 5-325 MG PO TABS: 2.0000 | ORAL_TABLET | ORAL | Status: DC | PRN

## 2011-07-31 MED ORDER — FREESTYLE SYSTEM KIT: 1.0000 | PACK | Freq: Every day | Status: AC

## 2011-07-31 MED ORDER — PREDNISONE 20 MG PO TABS: ORAL_TABLET | ORAL | Status: AC

## 2011-07-31 MED ORDER — GLUCOSE BLOOD VI STRP: ORAL_STRIP | Status: AC

## 2011-07-31 MED ORDER — MELOXICAM 15 MG PO TABS: 15.0000 mg | ORAL_TABLET | Freq: Every day | ORAL | Status: AC

## 2011-07-31 NOTE — Discharge Instructions (Signed)
Keep track of your blood sugars. They will be elevated while you take the steriods. take the meloxicam as written. continue the ice and tramadol if needed for severe pain. Take your wrist out of the splint several times a day and move your wrist through all range of motion to prevent it from stiffening up.   Go to www.goodrx.com to look up your medications. This will give you a list of where you can find your prescriptions at the most affordable prices.   RESOURCE GUIDE  Chronic Pain Problems Contact [facility]  [phone] Patients need to be referred by their primary care doctor.  Insufficient Money for Medicine Contact [employer]:  call "211" or [facility] [phone].  No Primary Care Doctor Call [facility]  [phone] Other agencies that provide inexpensive medical care    [facility]  [phone]    [facility]  [phone]    [facility]  [phone]    [facility]  [phone] [address] [place] [address]    [facility]  [phone]    [facility]  [phone] [facility] [phone]   [address] [place] [address] [place], [place] [address]   [facility] Resources  [facility]     [employer]                          [facility] Dept. [address] [place]                       [address]      [address] [place] [address]   [phone] (After Hours)

## 2011-07-31 NOTE — ED Provider Notes (Signed)
History     CSN: [id number]  Arrival date & time 07/31/11  1814   First MD Initiated Contact with Patient 07/31/11 1816      Chief Complaint  Patient presents with  . Hand Pain    (Consider location/radiation/quality/duration/timing/severity/associated sxs/prior treatment) HPI Comments: Patient is a right-handed female who reports sharp, tingling, pain in her right index finger and thumb for the past month. Pain is worse with pinching movements, use of her hand. Not affected with wrist movements.  No alleviating factors. States it is waking her up at night. No redness, swelling, fevers. No recent injury to her hand or arm. States she was bitten by a dog on her right arm last year, but that seal the incident. She also has had a nontender "lump" at the radial aspect of her right wrist that has been present for "years". Was told that it was a cyst of some kind, but did not require intervention, as it was not bothersome to her. Patient is diabetic, but states she keeps her sugars under control. She denies any tingling, paresthesias, pins and needles sensations in the rest of her right hand, left hand, or feet.  ROS as noted in HPI. All other ROS negative.   Patient is a 51 y.o. female presenting with hand injury. The history is provided by the patient.  Hand Injury  The incident occurred more than 1 week ago. There was no injury mechanism. The symptoms are aggravated by movement, use and palpation.    Past Medical History  Diagnosis Date  . Diabetes mellitus   . Kidney disease   . Hypothyroid   . Hypercholesteremia   . Back pain, chronic   . Scoliosis   . Herniated disc     Past Surgical History  Procedure Date  . Abdominal hysterectomy   . Cesarean section     History reviewed. No pertinent family history.  History  Substance Use Topics  . Smoking status: Never Smoker   . Smokeless tobacco: Not on file  . Alcohol Use: No    OB History    Grav Para Term Preterm  Abortions TAB SAB Ect Mult Living                  Review of Systems  Allergies  [name] and Imitrex  Home Medications   Current Outpatient Rx  Name Route Sig Dispense Refill  . CARISOPRODOL 350 MG PO TABS Oral Take 350 mg by mouth 4 (four) times daily as needed.    Marland Kitchen LEVOTHYROXINE SODIUM 100 MCG PO TABS Oral Take 100 mcg by mouth daily.    Marland Kitchen METFORMIN HCL 1000 MG PO TABS Oral Take 1,000 mg by mouth 2 (two) times daily with a meal.    . SIMVASTATIN 10 MG PO TABS Oral Take 10 mg by mouth at bedtime.    . TRAMADOL HCL 50 MG PO TABS Oral Take 50 mg by mouth every 6 (six) hours as needed.    Marland Kitchen GLUCOSE BLOOD VI STRP  Check your sugar in the morning before you eat breakfast, and one hour after a meal. 100 each 12  . FREESTYLE SYSTEM KIT Does not apply 1 each by Does not apply route daily. Check glucose once in the morning before breakfast and 1 hour after a meal 1 each 0  . MELOXICAM 15 MG PO TABS Oral Take 1 tablet (15 mg total) by mouth daily. 14 tablet 0  . PREDNISONE 20 MG PO TABS  Take 3 tabs  po on first day, 2 tabs second day, 2 tabs third day, 1 tab fourth day, 1 tab 5th day. Take with food. 9 tablet 0    BP 116/77  Pulse 79  Temp(Src) 98.5 F (36.9 C) (Oral)  Resp 16  SpO2 95%  Physical Exam  Nursing note and vitals reviewed. Constitutional: She is oriented to person, place, and time. She appears well-developed and well-nourished. No distress.  HENT:  Head: Normocephalic and atraumatic.  Eyes: Conjunctivae and EOM are normal.  Neck: Normal range of motion.  Cardiovascular: Normal rate.   Pulmonary/Chest: Effort normal.  Abdominal: She exhibits no distension.  Musculoskeletal: Normal range of motion.       Hands:      Tenderness, hyperesthesias, see drawing. Tenderness along the flexor tendons of the index and thumb. No tenderness on the APL/EPL. .  Hand with intact motor strength 5/5 flexion / extension / FDP/ FDS  against resistance and 2-point discrimination intact at  5mm in all fingers. Finkelstein negative. Skin intact. No redness, rash. No signs of trauma. Nontender, large ganglion radial aspect of wrist.  Neurological: She is alert and oriented to person, place, and time.  Skin: Skin is warm and dry.  Psychiatric: She has a normal mood and affect. Her behavior is normal. Judgment and thought content normal.    ED Course  Procedures (including critical care time)  Labs Reviewed  GLUCOSE, CAPILLARY - Abnormal; Notable for the following:    Glucose-Capillary 128 (*)    All other components within normal limits  LAB REPORT - SCANNED   No results found.   1. Tendonitis   2. Neuropathy      MDM  Patient's pain is primarily in the index finger and thumb. She has a large, nontender ganglion the radial aspect of her right wrist. Suspect impingement from this. Does not appear to be diabetic neuropathy, given the localized area of her symptoms, and the absence of any other paresthesias in her hands or feet. No bony tenderness, history of trauma. Imaging not indicated. Placing in a radial gutter splint, home with NSAIDs, short course of prednisone. Advised her that this will elevate her blood sugars while taking it. Patient also does not have a working glucometer at home. Prescribing another one. She can use her tramadol for severe pain. Will refer her to hand on call. Patient agrees with plan.  Luiz Blare, MD 08/01/11 [phone]

## 2011-07-31 NOTE — ED Notes (Signed)
Pt  Has  Symptoms  Of  Tingling in  Her  r  Hand     X  1  Month  She  Reports occasonally  Has       Pain     -  She  Attributes  This  Pain   From  Previous  Dog bites  By  [employer] a  Year  Ago     -  She  Is  Awake  As  Well as  Alert and  oriented    Skin is  Warm  /  Dry

## 2011-07-31 NOTE — Progress Notes (Signed)
Orthopedic Tech Progress Note Patient Details:  [name] May 18, 1960 [id number]  Type of Splint: Rad Gutter Splint Location: right hand    Nikki Dom 07/31/2011, 9:47 PM

## 2011-08-01 ENCOUNTER — Encounter (HOSPITAL_COMMUNITY): Payer: Self-pay | Admitting: *Deleted

## 2011-08-01 ENCOUNTER — Emergency Department (HOSPITAL_COMMUNITY)
Admission: EM | Admit: 2011-08-01 | Discharge: 2011-08-01 | Disposition: A | Discharge status: Home / Self Care | Payer: Medicare Other | Source: Home / Self Care | Attending: Family Medicine | Admitting: Family Medicine

## 2011-08-01 DIAGNOSIS — M6740 Ganglion, unspecified site: Secondary | ICD-10-CM | POA: Diagnosis not present

## 2011-08-01 DIAGNOSIS — M67431 Ganglion, right wrist: Secondary | ICD-10-CM

## 2011-08-01 NOTE — Discharge Instructions (Signed)
See [place] as planned, wear splint for comfort.

## 2011-08-01 NOTE — ED Provider Notes (Signed)
History     CSN: [id number]  Arrival date & time 08/01/11  1036   First MD Initiated Contact with Patient 08/01/11 1310      Chief Complaint  Patient presents with  . Hand Pain    (Consider location/radiation/quality/duration/timing/severity/associated sxs/prior treatment) Patient is a 51 y.o. female presenting with hand pain. The history is provided by the patient.  Hand Pain This is a new problem. The current episode started yesterday. The problem has been gradually worsening (seen last eve and had right hand splinted, , unhappy with splint, immobilizing fingers, also got wet in shower.).    Past Medical History  Diagnosis Date  . Diabetes mellitus   . Kidney disease   . Hypothyroid   . Hypercholesteremia   . Back pain, chronic   . Scoliosis   . Herniated disc     Past Surgical History  Procedure Date  . Abdominal hysterectomy   . Cesarean section     History reviewed. No pertinent family history.  History  Substance Use Topics  . Smoking status: Never Smoker   . Smokeless tobacco: Not on file  . Alcohol Use: No    OB History    Grav Para Term Preterm Abortions TAB SAB Ect Mult Living                  Review of Systems  Constitutional: Negative.   Musculoskeletal: Negative.     Allergies  [name] and Imitrex  Home Medications   Current Outpatient Rx  Name Route Sig Dispense Refill  . CARISOPRODOL 350 MG PO TABS Oral Take 350 mg by mouth 4 (four) times daily as needed.    Marland Kitchen GLUCOSE BLOOD VI STRP  Check your sugar in the morning before you eat breakfast, and one hour after a meal. 100 each 12  . FREESTYLE SYSTEM KIT Does not apply 1 each by Does not apply route daily. Check glucose once in the morning before breakfast and 1 hour after a meal 1 each 0  . LEVOTHYROXINE SODIUM 100 MCG PO TABS Oral Take 100 mcg by mouth daily.    . MELOXICAM 15 MG PO TABS Oral Take 1 tablet (15 mg total) by mouth daily. 14 tablet 0  . METFORMIN HCL 1000 MG PO TABS Oral  Take 1,000 mg by mouth 2 (two) times daily with a meal.    . PREDNISONE 20 MG PO TABS  Take 3 tabs po on first day, 2 tabs second day, 2 tabs third day, 1 tab fourth day, 1 tab 5th day. Take with food. 9 tablet 0  . SIMVASTATIN 10 MG PO TABS Oral Take 10 mg by mouth at bedtime.    . TRAMADOL HCL 50 MG PO TABS Oral Take 50 mg by mouth every 6 (six) hours as needed.      BP 138/84  Pulse 97  Temp(Src) 98.1 F (36.7 C) (Oral)  Resp 18  SpO2 100%  Physical Exam  Nursing note and vitals reviewed. Constitutional: She appears well-developed and well-nourished.  Musculoskeletal:       Splint is wet and therefore removed, pain in thumb is resolved, rom is intact. Sensation nl, skin beginning maceration from wet splint.   Neurological: She is alert.    ED Course  Procedures (including critical care time)  Labs Reviewed - No data to display No results found.   1. Ganglion cyst of wrist, right       MDM  Sx improved with velcro thumb spica splint.  Linna Hoff, MD 08/01/11 1330

## 2011-08-01 NOTE — ED Notes (Signed)
Pt    Notified   Of     Her  Status     Waiting  For the  Dr

## 2011-08-01 NOTE — ED Notes (Signed)
MED  THUMB  SPICA  APPLIED   - OLD  SPLINT REMOVED  BY  DR  Artis Flock

## 2011-08-01 NOTE — ED Notes (Signed)
Pt  Seen  Yesterday     For   Cyst  r  Hand  Area      Had  Splint  Placed  Yesterday        By  Milon Dikes     -     Here  For recheck  Of the  Area      -        Cap  Refill is  Brisk         Skin  Color  Normal

## 2011-08-14 DIAGNOSIS — F259 Schizoaffective disorder, unspecified: Secondary | ICD-10-CM | POA: Diagnosis not present

## 2011-08-20 DIAGNOSIS — F259 Schizoaffective disorder, unspecified: Secondary | ICD-10-CM | POA: Diagnosis not present

## 2011-08-30 DIAGNOSIS — G5600 Carpal tunnel syndrome, unspecified upper limb: Secondary | ICD-10-CM | POA: Diagnosis not present

## 2011-08-30 DIAGNOSIS — M6530 Trigger finger, unspecified finger: Secondary | ICD-10-CM | POA: Diagnosis not present

## 2011-09-09 DIAGNOSIS — G5600 Carpal tunnel syndrome, unspecified upper limb: Secondary | ICD-10-CM | POA: Diagnosis not present

## 2011-09-09 DIAGNOSIS — R209 Unspecified disturbances of skin sensation: Secondary | ICD-10-CM | POA: Diagnosis not present

## 2011-09-19 DIAGNOSIS — R209 Unspecified disturbances of skin sensation: Secondary | ICD-10-CM | POA: Diagnosis not present

## 2011-10-31 DIAGNOSIS — R209 Unspecified disturbances of skin sensation: Secondary | ICD-10-CM | POA: Diagnosis not present

## 2011-10-31 DIAGNOSIS — G5600 Carpal tunnel syndrome, unspecified upper limb: Secondary | ICD-10-CM | POA: Diagnosis not present

## 2011-10-31 DIAGNOSIS — M6740 Ganglion, unspecified site: Secondary | ICD-10-CM | POA: Diagnosis not present

## 2011-11-07 DIAGNOSIS — E7800 Pure hypercholesterolemia, unspecified: Secondary | ICD-10-CM | POA: Diagnosis not present

## 2011-11-07 DIAGNOSIS — R7309 Other abnormal glucose: Secondary | ICD-10-CM | POA: Diagnosis not present

## 2011-11-07 DIAGNOSIS — E119 Type 2 diabetes mellitus without complications: Secondary | ICD-10-CM | POA: Diagnosis not present

## 2011-11-07 DIAGNOSIS — E039 Hypothyroidism, unspecified: Secondary | ICD-10-CM | POA: Diagnosis not present

## 2011-11-21 DIAGNOSIS — E7800 Pure hypercholesterolemia, unspecified: Secondary | ICD-10-CM | POA: Diagnosis not present

## 2011-11-21 DIAGNOSIS — R7309 Other abnormal glucose: Secondary | ICD-10-CM | POA: Diagnosis not present

## 2011-11-21 DIAGNOSIS — E039 Hypothyroidism, unspecified: Secondary | ICD-10-CM | POA: Diagnosis not present

## 2012-01-20 DIAGNOSIS — E7800 Pure hypercholesterolemia, unspecified: Secondary | ICD-10-CM | POA: Diagnosis not present

## 2012-01-20 DIAGNOSIS — E119 Type 2 diabetes mellitus without complications: Secondary | ICD-10-CM | POA: Diagnosis not present

## 2012-02-04 DIAGNOSIS — Z1231 Encounter for screening mammogram for malignant neoplasm of breast: Secondary | ICD-10-CM | POA: Diagnosis not present

## 2012-03-16 DIAGNOSIS — M6740 Ganglion, unspecified site: Secondary | ICD-10-CM | POA: Diagnosis not present

## 2012-03-16 DIAGNOSIS — M6749 Ganglion, multiple sites: Secondary | ICD-10-CM | POA: Diagnosis not present

## 2012-03-16 DIAGNOSIS — G5600 Carpal tunnel syndrome, unspecified upper limb: Secondary | ICD-10-CM | POA: Diagnosis not present

## 2012-03-16 DIAGNOSIS — M7130 Other bursal cyst, unspecified site: Secondary | ICD-10-CM | POA: Diagnosis not present

## 2012-03-26 DIAGNOSIS — E119 Type 2 diabetes mellitus without complications: Secondary | ICD-10-CM | POA: Diagnosis not present

## 2012-03-26 DIAGNOSIS — E039 Hypothyroidism, unspecified: Secondary | ICD-10-CM | POA: Diagnosis not present

## 2012-03-26 DIAGNOSIS — E7800 Pure hypercholesterolemia, unspecified: Secondary | ICD-10-CM | POA: Diagnosis not present

## 2012-04-03 ENCOUNTER — Ambulatory Visit: Payer: Medicare Other | Attending: Orthopedic Surgery | Admitting: Occupational Therapy

## 2012-04-03 DIAGNOSIS — M6281 Muscle weakness (generalized): Secondary | ICD-10-CM | POA: Insufficient documentation

## 2012-04-03 DIAGNOSIS — M25549 Pain in joints of unspecified hand: Secondary | ICD-10-CM | POA: Insufficient documentation

## 2012-04-03 DIAGNOSIS — M2569 Stiffness of other specified joint, not elsewhere classified: Secondary | ICD-10-CM | POA: Diagnosis not present

## 2012-04-03 DIAGNOSIS — IMO0001 Reserved for inherently not codable concepts without codable children: Secondary | ICD-10-CM | POA: Insufficient documentation

## 2012-04-06 DIAGNOSIS — E039 Hypothyroidism, unspecified: Secondary | ICD-10-CM | POA: Diagnosis not present

## 2012-04-06 DIAGNOSIS — E7800 Pure hypercholesterolemia, unspecified: Secondary | ICD-10-CM | POA: Diagnosis not present

## 2012-04-06 DIAGNOSIS — E119 Type 2 diabetes mellitus without complications: Secondary | ICD-10-CM | POA: Diagnosis not present

## 2012-04-06 DIAGNOSIS — R7309 Other abnormal glucose: Secondary | ICD-10-CM | POA: Diagnosis not present

## 2012-04-07 ENCOUNTER — Ambulatory Visit: Payer: Medicare Other | Admitting: Occupational Therapy

## 2012-04-08 ENCOUNTER — Ambulatory Visit: Payer: Medicare Other | Admitting: Occupational Therapy

## 2012-04-15 ENCOUNTER — Ambulatory Visit: Payer: Medicare Other | Admitting: Occupational Therapy

## 2012-04-16 ENCOUNTER — Ambulatory Visit: Payer: Medicare Other | Admitting: Occupational Therapy

## 2012-04-20 ENCOUNTER — Ambulatory Visit: Payer: Medicare Other | Admitting: Occupational Therapy

## 2012-04-22 ENCOUNTER — Ambulatory Visit: Payer: Medicare Other | Admitting: Occupational Therapy

## 2012-04-29 ENCOUNTER — Ambulatory Visit: Payer: Medicare Other | Admitting: Occupational Therapy

## 2012-05-01 ENCOUNTER — Ambulatory Visit: Payer: Medicare Other | Admitting: Occupational Therapy

## 2012-05-06 ENCOUNTER — Ambulatory Visit: Payer: Medicare Other | Attending: Orthopedic Surgery | Admitting: Occupational Therapy

## 2012-05-06 DIAGNOSIS — M2569 Stiffness of other specified joint, not elsewhere classified: Secondary | ICD-10-CM | POA: Diagnosis not present

## 2012-05-06 DIAGNOSIS — M6281 Muscle weakness (generalized): Secondary | ICD-10-CM | POA: Insufficient documentation

## 2012-05-06 DIAGNOSIS — M25549 Pain in joints of unspecified hand: Secondary | ICD-10-CM | POA: Insufficient documentation

## 2012-05-06 DIAGNOSIS — IMO0001 Reserved for inherently not codable concepts without codable children: Secondary | ICD-10-CM | POA: Diagnosis not present

## 2012-05-08 ENCOUNTER — Ambulatory Visit: Payer: Medicare Other | Admitting: Occupational Therapy

## 2012-05-13 ENCOUNTER — Encounter: Payer: Medicare Other | Admitting: Occupational Therapy

## 2012-05-15 ENCOUNTER — Ambulatory Visit: Payer: Medicare Other | Admitting: Occupational Therapy

## 2012-05-21 ENCOUNTER — Ambulatory Visit: Payer: Medicare Other | Admitting: Occupational Therapy

## 2012-05-27 ENCOUNTER — Ambulatory Visit: Payer: Medicare Other | Admitting: Occupational Therapy

## 2012-05-29 ENCOUNTER — Encounter: Payer: Medicare Other | Admitting: Occupational Therapy

## 2012-06-01 DIAGNOSIS — S6990XA Unspecified injury of unspecified wrist, hand and finger(s), initial encounter: Secondary | ICD-10-CM | POA: Diagnosis not present

## 2012-06-01 DIAGNOSIS — W010XXA Fall on same level from slipping, tripping and stumbling without subsequent striking against object, initial encounter: Secondary | ICD-10-CM | POA: Diagnosis not present

## 2012-06-01 DIAGNOSIS — R6889 Other general symptoms and signs: Secondary | ICD-10-CM | POA: Diagnosis not present

## 2012-06-01 DIAGNOSIS — IMO0002 Reserved for concepts with insufficient information to code with codable children: Secondary | ICD-10-CM | POA: Diagnosis not present

## 2012-06-01 DIAGNOSIS — M79609 Pain in unspecified limb: Secondary | ICD-10-CM | POA: Diagnosis not present

## 2012-06-01 DIAGNOSIS — M25549 Pain in joints of unspecified hand: Secondary | ICD-10-CM | POA: Diagnosis not present

## 2012-06-01 DIAGNOSIS — S59909A Unspecified injury of unspecified elbow, initial encounter: Secondary | ICD-10-CM | POA: Diagnosis not present

## 2012-06-01 DIAGNOSIS — S59919A Unspecified injury of unspecified forearm, initial encounter: Secondary | ICD-10-CM | POA: Diagnosis not present

## 2012-06-04 DIAGNOSIS — S5010XA Contusion of unspecified forearm, initial encounter: Secondary | ICD-10-CM | POA: Diagnosis not present

## 2012-06-04 DIAGNOSIS — E119 Type 2 diabetes mellitus without complications: Secondary | ICD-10-CM | POA: Diagnosis not present

## 2012-06-04 DIAGNOSIS — S20229A Contusion of unspecified back wall of thorax, initial encounter: Secondary | ICD-10-CM | POA: Diagnosis not present

## 2012-06-04 DIAGNOSIS — S40019A Contusion of unspecified shoulder, initial encounter: Secondary | ICD-10-CM | POA: Diagnosis not present

## 2012-06-18 DIAGNOSIS — E039 Hypothyroidism, unspecified: Secondary | ICD-10-CM | POA: Diagnosis not present

## 2012-06-18 DIAGNOSIS — S5010XA Contusion of unspecified forearm, initial encounter: Secondary | ICD-10-CM | POA: Diagnosis not present

## 2012-06-18 DIAGNOSIS — R7309 Other abnormal glucose: Secondary | ICD-10-CM | POA: Diagnosis not present

## 2012-06-18 DIAGNOSIS — S40019A Contusion of unspecified shoulder, initial encounter: Secondary | ICD-10-CM | POA: Diagnosis not present

## 2012-06-22 DIAGNOSIS — M6530 Trigger finger, unspecified finger: Secondary | ICD-10-CM | POA: Diagnosis not present

## 2012-07-06 DIAGNOSIS — M6530 Trigger finger, unspecified finger: Secondary | ICD-10-CM | POA: Diagnosis not present

## 2012-07-30 DIAGNOSIS — E7800 Pure hypercholesterolemia, unspecified: Secondary | ICD-10-CM | POA: Diagnosis not present

## 2012-07-30 DIAGNOSIS — E119 Type 2 diabetes mellitus without complications: Secondary | ICD-10-CM | POA: Diagnosis not present

## 2012-07-30 DIAGNOSIS — E039 Hypothyroidism, unspecified: Secondary | ICD-10-CM | POA: Diagnosis not present

## 2012-08-03 DIAGNOSIS — Z4789 Encounter for other orthopedic aftercare: Secondary | ICD-10-CM | POA: Diagnosis not present

## 2012-08-05 DIAGNOSIS — E119 Type 2 diabetes mellitus without complications: Secondary | ICD-10-CM | POA: Diagnosis not present

## 2012-08-05 DIAGNOSIS — E7800 Pure hypercholesterolemia, unspecified: Secondary | ICD-10-CM | POA: Diagnosis not present

## 2012-08-05 DIAGNOSIS — I10 Essential (primary) hypertension: Secondary | ICD-10-CM | POA: Diagnosis not present

## 2012-08-18 DIAGNOSIS — E669 Obesity, unspecified: Secondary | ICD-10-CM | POA: Diagnosis not present

## 2012-08-18 DIAGNOSIS — K8020 Calculus of gallbladder without cholecystitis without obstruction: Secondary | ICD-10-CM | POA: Diagnosis not present

## 2012-08-18 DIAGNOSIS — Z1211 Encounter for screening for malignant neoplasm of colon: Secondary | ICD-10-CM | POA: Diagnosis not present

## 2012-08-18 DIAGNOSIS — Z800 Family history of malignant neoplasm of digestive organs: Secondary | ICD-10-CM | POA: Diagnosis not present

## 2012-09-03 ENCOUNTER — Encounter (HOSPITAL_COMMUNITY): Payer: Self-pay | Admitting: Emergency Medicine

## 2012-09-03 ENCOUNTER — Emergency Department (HOSPITAL_COMMUNITY)
Admission: EM | Admit: 2012-09-03 | Discharge: 2012-09-03 | Disposition: A | Discharge status: Home / Self Care | Payer: Medicare Other | Attending: Emergency Medicine | Admitting: Emergency Medicine

## 2012-09-03 DIAGNOSIS — IMO0002 Reserved for concepts with insufficient information to code with codable children: Secondary | ICD-10-CM | POA: Insufficient documentation

## 2012-09-03 DIAGNOSIS — Z79899 Other long term (current) drug therapy: Secondary | ICD-10-CM | POA: Insufficient documentation

## 2012-09-03 DIAGNOSIS — T23201A Burn of second degree of right hand, unspecified site, initial encounter: Secondary | ICD-10-CM

## 2012-09-03 DIAGNOSIS — E039 Hypothyroidism, unspecified: Secondary | ICD-10-CM | POA: Insufficient documentation

## 2012-09-03 DIAGNOSIS — T23209A Burn of second degree of unspecified hand, unspecified site, initial encounter: Secondary | ICD-10-CM | POA: Insufficient documentation

## 2012-09-03 DIAGNOSIS — T23229A Burn of second degree of unspecified single finger (nail) except thumb, initial encounter: Secondary | ICD-10-CM | POA: Diagnosis not present

## 2012-09-03 DIAGNOSIS — Z8739 Personal history of other diseases of the musculoskeletal system and connective tissue: Secondary | ICD-10-CM | POA: Insufficient documentation

## 2012-09-03 DIAGNOSIS — Z87448 Personal history of other diseases of urinary system: Secondary | ICD-10-CM | POA: Diagnosis not present

## 2012-09-03 DIAGNOSIS — T310 Burns involving less than 10% of body surface: Secondary | ICD-10-CM | POA: Diagnosis not present

## 2012-09-03 DIAGNOSIS — G8929 Other chronic pain: Secondary | ICD-10-CM | POA: Diagnosis not present

## 2012-09-03 DIAGNOSIS — E7800 Pure hypercholesterolemia, unspecified: Secondary | ICD-10-CM | POA: Insufficient documentation

## 2012-09-03 DIAGNOSIS — Y9289 Other specified places as the place of occurrence of the external cause: Secondary | ICD-10-CM | POA: Insufficient documentation

## 2012-09-03 DIAGNOSIS — T23259A Burn of second degree of unspecified palm, initial encounter: Secondary | ICD-10-CM | POA: Diagnosis not present

## 2012-09-03 DIAGNOSIS — X19XXXA Contact with other heat and hot substances, initial encounter: Secondary | ICD-10-CM | POA: Insufficient documentation

## 2012-09-03 DIAGNOSIS — E119 Type 2 diabetes mellitus without complications: Secondary | ICD-10-CM | POA: Insufficient documentation

## 2012-09-03 DIAGNOSIS — Y9389 Activity, other specified: Secondary | ICD-10-CM | POA: Insufficient documentation

## 2012-09-03 MED ORDER — SILVER SULFADIAZINE 1 % EX CREA
TOPICAL_CREAM | Freq: Once | CUTANEOUS | Status: AC
  Administered 2012-09-03: 13:00:00 via TOPICAL
  Filled 2012-09-03: qty 85

## 2012-09-03 NOTE — ED Notes (Signed)
Patient requested ice pack.  Ice-pack given.

## 2012-09-03 NOTE — ED Notes (Signed)
Pt with right hand burn from lawnmower today; small blister noted

## 2012-09-03 NOTE — ED Notes (Signed)
Given ice pack at pt's request.

## 2012-09-03 NOTE — ED Provider Notes (Signed)
History     CSN: [id number]  Arrival date & time 09/03/12  1142   First MD Initiated Contact with Patient 09/03/12 1220      Chief Complaint  Patient presents with  . Burn    (Consider location/radiation/quality/duration/timing/severity/associated sxs/prior treatment) HPI Comments: Patient reports she reached underneath  [place] that was turned on yesterday and burned her hand on the motor.  Indicates pain over her thenar eminence, the ulnar side of her hand, and the distal fat pad of her index finger (palmar aspect).  Has used SOMA, tramadol, and ice with some improvement.  Pain is constant, 7/10, 10/10 at its worst.  Has noted intact blisters.  Has tried no other treatment.  Denies weakness or numbness of the hand, denies other injury, denies difficulty moving hand or fingers.  Denies fevers.   Patient is a 52 y.o. female presenting with burn. The history is provided by the patient.  Burn   Past Medical History  Diagnosis Date  . Diabetes mellitus   . Kidney disease   . Hypothyroid   . Hypercholesteremia   . Back pain, chronic   . Scoliosis   . Herniated disc     Past Surgical History  Procedure Laterality Date  . Abdominal hysterectomy    . Cesarean section      History reviewed. No pertinent family history.  History  Substance Use Topics  . Smoking status: Never Smoker   . Smokeless tobacco: Not on file  . Alcohol Use: No    OB History   Grav Para Term Preterm Abortions TAB SAB Ect Mult Living                  Review of Systems  Constitutional: Negative for fever and chills.  Skin: Positive for wound. Negative for color change and pallor.  Neurological: Negative for weakness and numbness.    Allergies  Imitrex and [name]  Home Medications   Current Outpatient Rx  Name  Route  Sig  Dispense  Refill  . Biotin 10 MG CAPS   Oral   Take 2 capsules by mouth at bedtime.         . carisoprodol (SOMA) 350 MG tablet   Oral   Take 350 mg by mouth 4  (four) times daily as needed for muscle spasms.          . fluticasone (FLONASE) 50 MCG/ACT nasal spray   Nasal   Place 2 sprays into the nose daily as needed for rhinitis or allergies.         Marland Kitchen levothyroxine (SYNTHROID, LEVOTHROID) 200 MCG tablet   Oral   Take 200 mcg by mouth daily before breakfast.         . simvastatin (ZOCOR) 10 MG tablet   Oral   Take 10 mg by mouth at bedtime.         . SitaGLIPtin-MetFORMIN HCl (JANUMET XR) [phone] MG TB24   Oral   Take 1 tablet by mouth at bedtime.         . traMADol (ULTRAM) 50 MG tablet   Oral   Take 50 mg by mouth every 6 (six) hours as needed for pain.            BP 151/92  Pulse 80  Temp(Src) 97.4 F (36.3 C) (Oral)  Resp 18  SpO2 98%  Physical Exam  Nursing note and vitals reviewed. Constitutional: She appears well-developed and well-nourished. No distress.  HENT:  Head: Normocephalic and atraumatic.  Neck: Neck supple.  Pulmonary/Chest: Effort normal.  Musculoskeletal:       Right wrist: Normal.       Right hand: She exhibits tenderness. She exhibits normal range of motion, no bony tenderness, normal capillary refill, no laceration and no swelling. Normal sensation noted. Normal strength noted.       Hands: Neurological: She is alert.  Skin: She is not diaphoretic.    ED Course  Procedures (including critical care time)  Labs Reviewed - No data to display No results found.   1. Partial thickness burn of hand, right, initial encounter     MDM  Pt with very small area of second degree burn of right hand (pt is right handed).  The burned area covers approximately 1-2 cm total.  Discussed burn care and follow up, return precautions with pt.  Strict return precautions given.  Pt advised to follow closely with PCP.  Pt verbalizes understanding and agrees with plan.         Trixie Dredge, PA-C 09/03/12 1344

## 2012-09-05 NOTE — ED Provider Notes (Signed)
Medical screening examination/treatment/procedure(s) were performed by non-physician practitioner and as supervising physician I was immediately available for consultation/collaboration.    Mehtaab Mayeda D Autry Prust, MD 09/05/12 0925 

## 2012-09-08 DIAGNOSIS — E119 Type 2 diabetes mellitus without complications: Secondary | ICD-10-CM | POA: Diagnosis not present

## 2012-09-08 DIAGNOSIS — H40029 Open angle with borderline findings, high risk, unspecified eye: Secondary | ICD-10-CM | POA: Diagnosis not present

## 2012-09-16 DIAGNOSIS — L089 Local infection of the skin and subcutaneous tissue, unspecified: Secondary | ICD-10-CM | POA: Diagnosis not present

## 2012-09-16 DIAGNOSIS — L658 Other specified nonscarring hair loss: Secondary | ICD-10-CM | POA: Diagnosis not present

## 2012-10-21 DIAGNOSIS — Z1211 Encounter for screening for malignant neoplasm of colon: Secondary | ICD-10-CM | POA: Diagnosis not present

## 2012-10-21 DIAGNOSIS — Z800 Family history of malignant neoplasm of digestive organs: Secondary | ICD-10-CM | POA: Diagnosis not present

## 2012-11-03 DIAGNOSIS — I10 Essential (primary) hypertension: Secondary | ICD-10-CM | POA: Diagnosis not present

## 2012-11-03 DIAGNOSIS — M6530 Trigger finger, unspecified finger: Secondary | ICD-10-CM | POA: Diagnosis not present

## 2012-11-03 DIAGNOSIS — E7800 Pure hypercholesterolemia, unspecified: Secondary | ICD-10-CM | POA: Diagnosis not present

## 2012-11-03 DIAGNOSIS — E119 Type 2 diabetes mellitus without complications: Secondary | ICD-10-CM | POA: Diagnosis not present

## 2012-11-05 ENCOUNTER — Emergency Department (HOSPITAL_COMMUNITY): Payer: Medicare Other

## 2012-11-05 ENCOUNTER — Encounter (HOSPITAL_COMMUNITY): Payer: Self-pay | Admitting: Emergency Medicine

## 2012-11-05 DIAGNOSIS — Z79899 Other long term (current) drug therapy: Secondary | ICD-10-CM | POA: Insufficient documentation

## 2012-11-05 DIAGNOSIS — F341 Dysthymic disorder: Secondary | ICD-10-CM | POA: Diagnosis not present

## 2012-11-05 DIAGNOSIS — G8929 Other chronic pain: Secondary | ICD-10-CM | POA: Insufficient documentation

## 2012-11-05 DIAGNOSIS — M549 Dorsalgia, unspecified: Secondary | ICD-10-CM | POA: Diagnosis not present

## 2012-11-05 DIAGNOSIS — Z8739 Personal history of other diseases of the musculoskeletal system and connective tissue: Secondary | ICD-10-CM | POA: Diagnosis not present

## 2012-11-05 DIAGNOSIS — E039 Hypothyroidism, unspecified: Secondary | ICD-10-CM | POA: Insufficient documentation

## 2012-11-05 DIAGNOSIS — Z87448 Personal history of other diseases of urinary system: Secondary | ICD-10-CM | POA: Diagnosis not present

## 2012-11-05 DIAGNOSIS — E119 Type 2 diabetes mellitus without complications: Secondary | ICD-10-CM | POA: Insufficient documentation

## 2012-11-05 DIAGNOSIS — F411 Generalized anxiety disorder: Secondary | ICD-10-CM | POA: Diagnosis not present

## 2012-11-05 DIAGNOSIS — E7800 Pure hypercholesterolemia, unspecified: Secondary | ICD-10-CM | POA: Diagnosis not present

## 2012-11-05 DIAGNOSIS — R079 Chest pain, unspecified: Secondary | ICD-10-CM | POA: Diagnosis not present

## 2012-11-05 DIAGNOSIS — R0789 Other chest pain: Secondary | ICD-10-CM | POA: Diagnosis not present

## 2012-11-05 LAB — BASIC METABOLIC PANEL
BUN: 17 mg/dL (ref 6–23)
CO2: 22 mEq/L (ref 19–32)
Chloride: 105 mEq/L (ref 96–112)
Creatinine, Ser: 0.9 mg/dL (ref 0.50–1.10)
GFR calc Af Amer: 83 mL/min — ABNORMAL LOW (ref >90)
Glucose, Bld: 125 mg/dL — ABNORMAL HIGH (ref 70–99)
Potassium: 3.7 mEq/L (ref 3.5–5.1)

## 2012-11-05 LAB — CBC
HCT: 37.4 % (ref 36.0–46.0)
Hemoglobin: 12.7 g/dL (ref 12.0–15.0)
MCH: 26.8 pg (ref 26.0–34.0)
MCHC: 34 g/dL (ref 30.0–36.0)
MCV: 78.9 fL (ref 78.0–100.0)
RDW: 13.4 % (ref 11.5–15.5)

## 2012-11-05 NOTE — ED Notes (Signed)
PT. REPORTS LEFT CHEST PAIN " TIGHT" WITH NAUSEA ONSET THIS AFTERNOON , DENIES SOB OR COUGH , NO EMESIS OR DIAPHORESIS.

## 2012-11-06 ENCOUNTER — Emergency Department (HOSPITAL_COMMUNITY)
Admission: EM | Admit: 2012-11-06 | Discharge: 2012-11-06 | Disposition: A | Discharge status: Home / Self Care | Payer: Medicare Other | Attending: Emergency Medicine | Admitting: Emergency Medicine

## 2012-11-06 DIAGNOSIS — R079 Chest pain, unspecified: Secondary | ICD-10-CM

## 2012-11-06 DIAGNOSIS — F419 Anxiety disorder, unspecified: Secondary | ICD-10-CM

## 2012-11-06 LAB — POCT I-STAT TROPONIN I

## 2012-11-06 MED ORDER — LORAZEPAM 1 MG PO TABS: 1.0000 mg | ORAL_TABLET | Freq: Three times a day (TID) | ORAL | Status: DC | PRN

## 2012-11-06 MED ORDER — LORAZEPAM 1 MG PO TABS
1.0000 mg | ORAL_TABLET | Freq: Once | ORAL | Status: AC
  Administered 2012-11-06: 1 mg via ORAL
  Filled 2012-11-06: qty 1

## 2012-11-06 NOTE — ED Provider Notes (Signed)
CSN: [id number]     Arrival date & time 11/05/12  2220 History     None    Chief Complaint  Patient presents with  . Chest Pain   HPI Lori Jones is a 52 y.o. female history of diabetes, hypercholesterolemia, anxiety and depression currently not being treated for anxiety or depression presents with left-sided chest pain it started at 2:00. She says this is intermittent, it lasts 30-40 minutes, it is felt as a tightness, they can be as bad as 8/10, no radiation, not associated with shortness of breath, diaphoresis, nausea, vomiting, abdominal pain.  No prior history of coronary artery disease. Patient says when she thinks about her pain and gets anxious it hurts worse. Patient currently has mild pain.   Past Medical History  Diagnosis Date  . Diabetes mellitus   . Kidney disease   . Hypothyroid   . Hypercholesteremia   . Back pain, chronic   . Scoliosis   . Herniated disc    Past Surgical History  Procedure Laterality Date  . Abdominal hysterectomy    . Cesarean section     No family history on file. History  Substance Use Topics  . Smoking status: Never Smoker   . Smokeless tobacco: Not on file  . Alcohol Use: No   OB History   Grav Para Term Preterm Abortions TAB SAB Ect Mult Living                 Review of Systems At least 10pt or greater review of systems completed and are negative except where specified in the HPI.  Allergies  [name] and Imitrex  Home Medications   Current Outpatient Rx  Name  Route  Sig  Dispense  Refill  . Biotin 10 MG CAPS   Oral   Take 1 capsule by mouth 2 (two) times daily.          Marland Kitchen levothyroxine (SYNTHROID, LEVOTHROID) 200 MCG tablet   Oral   Take 200 mcg by mouth daily before breakfast.         . simvastatin (ZOCOR) 10 MG tablet   Oral   Take 10 mg by mouth at bedtime.         . SitaGLIPtin-MetFORMIN HCl (JANUMET XR) [phone] MG TB24   Oral   Take 1 tablet by mouth at bedtime.         . traMADol (ULTRAM) 50 MG  tablet   Oral   Take 50 mg by mouth every 6 (six) hours as needed for pain.           BP 129/79  Pulse 71  Temp(Src) 98.3 F (36.8 C) (Oral)  Resp 16  SpO2 100% Physical Exam  Nursing notes reviewed.  Electronic medical record reviewed. VITAL SIGNS:   Filed Vitals:   11/06/12 0530 11/06/12 0545 11/06/12 0600 11/06/12 0615  BP: 131/90 122/83 136/84 136/84  Pulse: 59 66 62 64  Temp:      TempSrc:      Resp: 18 22 17 16   SpO2: 99% 97% 100% 100%   CONSTITUTIONAL: Awake, oriented, appears non-toxic, anxious HENT: Atraumatic, normocephalic, oral mucosa pink and moist, airway patent. Nares patent without drainage. External ears normal. EYES: Conjunctiva clear, EOMI, PERRLA NECK: Trachea midline, non-tender, supple CARDIOVASCULAR: Normal heart rate, Normal rhythm, No murmurs, rubs, gallops PULMONARY/CHEST: Clear to auscultation, no rhonchi, wheezes, or rales. Symmetrical breath sounds. Non-tender. ABDOMINAL: Non-distended, soft, non-tender - no rebound or guarding.  BS normal. NEUROLOGIC: Non-focal, moving all  four extremities, no gross sensory or motor deficits. EXTREMITIES: No clubbing, cyanosis, or edema SKIN: Warm, Dry, No erythema, No rash  ED Course   Procedures (including critical care time)  Date: 11/06/2012  Rate: 71  Rhythm: normal sinus rhythm  QRS Axis: normal  Intervals: normal  ST/T Wave abnormalities: normal  Conduction Disutrbances: none  Narrative Interpretation: No acute ischemia or infarction, normal sinus rhythm.    Date: 11/06/2012 at 0417  Rate: 70  Rhythm: normal sinus rhythm  QRS Axis: normal  Intervals: normal  ST/T Wave abnormalities: normal  Conduction Disutrbances: none  Narrative Interpretation: unremarkable, no ischemia or infarction. No change from earlier EKG     Labs Reviewed  BASIC METABOLIC PANEL - Abnormal; Notable for the following:    Glucose, Bld 125 (*)    GFR calc non Af Amer 72 (*)    GFR calc Af Amer 83 (*)    All  other components within normal limits  CBC  POCT I-STAT TROPONIN I  POCT I-STAT TROPONIN I   Dg Chest 2 View  11/05/2012   *RADIOLOGY REPORT*  Clinical Data: Chest pain.  CHEST - 2 VIEW  Comparison: Plain films thoracic spine 10/10/2008.  Findings: The lungs are clear.  Heart size is upper normal.  No pneumothorax or pleural effusion.  Thoracolumbar scoliosis again seen.  IMPRESSION: No acute finding.   Original Report Authenticated By: Holley Dexter, M.D.   1. Chest pain   2. Anxiety    Medications  LORazepam (ATIVAN) tablet 1 mg (1 mg Oral Given 11/06/12 0134)    MDM  Lori Jones is a 52 y.o. female presenting with chest pain it's been present since 2:00 this afternoon, it's waxing and waning in tightness, no associated symptoms or radiation, patient is diabetic however I do not think this is an atypical presentation of acute coronary syndrome. I do not think this patient is having a cardiac event at this time I do not think this is an anginal equivalent, she is PERC negative, do not think she has any intrathoracic emergencies at this time however we'll obtain 2 EKGs to troponins to rule patient out in the emergency department. Patient's first troponin is unremarkable, a second one performed almost 15 hours after the onset of chest pain is still negative. No change in EKG. Patient's pain is almost gone, this got better with Ativan and no other pain medicine. If the patient is having problems with her anxiety, previously she was on Lexapro however prescription ran out she has not seen her primary care physician. She is originally from [place], she then moved to [place] and has not seen a local primary care provider. Will [facility] the [facility] to followup within the next 3 days. Give her a small amount of Ativan to get her through until then, patient is told to return to the emergency department for any worsening symptoms.  Discussed with patient, she  understands and accepts the medical plan as it's been dictated, she agrees with it, I have answered her questions to her satisfaction.   Jones Skene, MD 11/06/12 [phone]

## 2012-11-06 NOTE — ED Notes (Signed)
Pt st's she had chest pain like she is having now in 2006/09/04 after her mother died.  St's she was told it was due to being upset.  Pt st's chest pain started earlier today and st's she has been under a lot of stress.  Skin warm and dry color appropriate. Cardiac monitor NSR at this time.

## 2012-12-29 DIAGNOSIS — Z0000 Encounter for general adult medical examination without abnormal findings: Secondary | ICD-10-CM | POA: Diagnosis not present

## 2013-01-07 DIAGNOSIS — F259 Schizoaffective disorder, unspecified: Secondary | ICD-10-CM | POA: Diagnosis not present

## 2013-02-02 DIAGNOSIS — E039 Hypothyroidism, unspecified: Secondary | ICD-10-CM | POA: Diagnosis not present

## 2013-02-02 DIAGNOSIS — I10 Essential (primary) hypertension: Secondary | ICD-10-CM | POA: Diagnosis not present

## 2013-02-02 DIAGNOSIS — E7800 Pure hypercholesterolemia, unspecified: Secondary | ICD-10-CM | POA: Diagnosis not present

## 2013-02-02 DIAGNOSIS — E119 Type 2 diabetes mellitus without complications: Secondary | ICD-10-CM | POA: Diagnosis not present

## 2013-02-03 DIAGNOSIS — Z1231 Encounter for screening mammogram for malignant neoplasm of breast: Secondary | ICD-10-CM | POA: Diagnosis not present

## 2013-02-08 DIAGNOSIS — M2010 Hallux valgus (acquired), unspecified foot: Secondary | ICD-10-CM | POA: Diagnosis not present

## 2013-02-08 DIAGNOSIS — B351 Tinea unguium: Secondary | ICD-10-CM | POA: Diagnosis not present

## 2013-02-08 DIAGNOSIS — M79609 Pain in unspecified limb: Secondary | ICD-10-CM | POA: Diagnosis not present

## 2013-02-15 DIAGNOSIS — M25579 Pain in unspecified ankle and joints of unspecified foot: Secondary | ICD-10-CM | POA: Diagnosis not present

## 2013-02-15 DIAGNOSIS — M12279 Villonodular synovitis (pigmented), unspecified ankle and foot: Secondary | ICD-10-CM | POA: Diagnosis not present

## 2013-02-15 DIAGNOSIS — M722 Plantar fascial fibromatosis: Secondary | ICD-10-CM | POA: Diagnosis not present

## 2013-02-18 DIAGNOSIS — Z124 Encounter for screening for malignant neoplasm of cervix: Secondary | ICD-10-CM | POA: Diagnosis not present

## 2013-02-18 DIAGNOSIS — R928 Other abnormal and inconclusive findings on diagnostic imaging of breast: Secondary | ICD-10-CM | POA: Diagnosis not present

## 2013-02-18 DIAGNOSIS — N63 Unspecified lump in unspecified breast: Secondary | ICD-10-CM | POA: Diagnosis not present

## 2013-02-19 DIAGNOSIS — F4310 Post-traumatic stress disorder, unspecified: Secondary | ICD-10-CM | POA: Diagnosis not present

## 2013-02-19 DIAGNOSIS — E119 Type 2 diabetes mellitus without complications: Secondary | ICD-10-CM | POA: Diagnosis not present

## 2013-02-19 DIAGNOSIS — J029 Acute pharyngitis, unspecified: Secondary | ICD-10-CM | POA: Diagnosis not present

## 2013-02-19 DIAGNOSIS — R070 Pain in throat: Secondary | ICD-10-CM | POA: Diagnosis not present

## 2013-02-23 ENCOUNTER — Other Ambulatory Visit: Payer: Self-pay | Admitting: Radiology

## 2013-02-23 DIAGNOSIS — M6530 Trigger finger, unspecified finger: Secondary | ICD-10-CM | POA: Diagnosis not present

## 2013-02-23 DIAGNOSIS — D249 Benign neoplasm of unspecified breast: Secondary | ICD-10-CM | POA: Diagnosis not present

## 2013-03-08 DIAGNOSIS — B351 Tinea unguium: Secondary | ICD-10-CM | POA: Diagnosis not present

## 2013-03-08 DIAGNOSIS — M79609 Pain in unspecified limb: Secondary | ICD-10-CM | POA: Diagnosis not present

## 2013-03-30 DIAGNOSIS — E785 Hyperlipidemia, unspecified: Secondary | ICD-10-CM | POA: Diagnosis not present

## 2013-03-30 DIAGNOSIS — I10 Essential (primary) hypertension: Secondary | ICD-10-CM | POA: Diagnosis not present

## 2013-03-30 DIAGNOSIS — F329 Major depressive disorder, single episode, unspecified: Secondary | ICD-10-CM | POA: Diagnosis not present

## 2013-03-30 DIAGNOSIS — F3289 Other specified depressive episodes: Secondary | ICD-10-CM | POA: Diagnosis not present

## 2013-03-30 DIAGNOSIS — F070 Personality change due to known physiological condition: Secondary | ICD-10-CM | POA: Diagnosis not present

## 2013-03-30 DIAGNOSIS — E119 Type 2 diabetes mellitus without complications: Secondary | ICD-10-CM | POA: Diagnosis not present

## 2013-03-30 DIAGNOSIS — E7800 Pure hypercholesterolemia, unspecified: Secondary | ICD-10-CM | POA: Diagnosis not present

## 2013-04-05 DIAGNOSIS — M79609 Pain in unspecified limb: Secondary | ICD-10-CM | POA: Diagnosis not present

## 2013-04-05 DIAGNOSIS — R070 Pain in throat: Secondary | ICD-10-CM | POA: Diagnosis not present

## 2013-04-05 DIAGNOSIS — B351 Tinea unguium: Secondary | ICD-10-CM | POA: Diagnosis not present

## 2013-04-05 DIAGNOSIS — H60399 Other infective otitis externa, unspecified ear: Secondary | ICD-10-CM | POA: Diagnosis not present

## 2013-04-07 DIAGNOSIS — M6530 Trigger finger, unspecified finger: Secondary | ICD-10-CM | POA: Diagnosis not present

## 2013-04-19 DIAGNOSIS — N760 Acute vaginitis: Secondary | ICD-10-CM | POA: Diagnosis not present

## 2013-04-23 DIAGNOSIS — Z113 Encounter for screening for infections with a predominantly sexual mode of transmission: Secondary | ICD-10-CM | POA: Diagnosis not present

## 2013-04-23 DIAGNOSIS — N952 Postmenopausal atrophic vaginitis: Secondary | ICD-10-CM | POA: Diagnosis not present

## 2013-04-23 DIAGNOSIS — N390 Urinary tract infection, site not specified: Secondary | ICD-10-CM | POA: Diagnosis not present

## 2013-04-28 DIAGNOSIS — A6004 Herpesviral vulvovaginitis: Secondary | ICD-10-CM | POA: Diagnosis not present

## 2013-05-07 DIAGNOSIS — M79609 Pain in unspecified limb: Secondary | ICD-10-CM | POA: Diagnosis not present

## 2013-05-07 DIAGNOSIS — B351 Tinea unguium: Secondary | ICD-10-CM | POA: Diagnosis not present

## 2013-05-12 DIAGNOSIS — E119 Type 2 diabetes mellitus without complications: Secondary | ICD-10-CM | POA: Diagnosis not present

## 2013-05-12 DIAGNOSIS — E7800 Pure hypercholesterolemia, unspecified: Secondary | ICD-10-CM | POA: Diagnosis not present

## 2013-05-12 DIAGNOSIS — I10 Essential (primary) hypertension: Secondary | ICD-10-CM | POA: Diagnosis not present

## 2013-05-26 DIAGNOSIS — R109 Unspecified abdominal pain: Secondary | ICD-10-CM | POA: Diagnosis not present

## 2013-07-08 ENCOUNTER — Ambulatory Visit (INDEPENDENT_AMBULATORY_CARE_PROVIDER_SITE_OTHER): Payer: Medicare Other | Admitting: Family Medicine

## 2013-07-08 VITALS — BP 137/68 | HR 86 | Temp 99.1°F | Wt 199.0 lb

## 2013-07-08 DIAGNOSIS — F259 Schizoaffective disorder, unspecified: Secondary | ICD-10-CM

## 2013-07-08 NOTE — Progress Notes (Signed)
Patient ID: Lori Jones, female   DOB: [birth year], 53 y.o.   MRN: [id number] Subjective:   CC: Patient presents today to establish care. Additionally, patient would like to discuss depression.  HPI:   Psych - Diagnosed when child with depression. At some point, diagnosed with schizoaffective disorder and anxiety. Has not seen psychiatrist in 2-3 years. She wants a female psychiatrist. Hospitalized 7 days in [place] due to [place]. No SI or HI since. Replaced suicidality with going to church, and that has been a positive change for her. Does not take medication, but ws on something in [place] and is unsure what it was.  Review of Systems - Per HPI. Additionally, left arm pain for 1 mo that is gone. Has part of abdomen that "sticks out" but is not doing that today.  PMH: - Allergies - Anemia 2004 - Arthritis - Chronic pain  -Diabetes -Schizoaffective disorder -Scoliosis -Pelvic kidney x 2  - had seen nephrologist in the past -C5-C6 supposed to have surgery due to inujury on the job - still has burning if sits up for long time -Murmur -Right hand dog bite and surgery  Prior providers: - Dr Johnsie Kindred (PCP) - [place] - Dr Henrietta Hoover, [place] - Dr Harmon Pier, [place] - Dr Collier Flowers  [facility], [place]  Surgeries: - Dec 2013 and Jan 2015 2 hand surgeries - c-section 1984 - Partial hysterectomy 2004 2/2 bleeding  Screenings/tests: - Colonoscopy: 03/2013 - Mammogram 03/2013 - Pap 04/2013 - DEXA 2002 - Tetanus shot 2010 - Cardiac stress test 2012 - Xrays 2014 - CT or MRI 2013  Medications - reviewed  FH: Reviewed  SH: - Female sexual partner - On disability - Forensic psychologist - Lives alone - Walks/Bikes but fears dog attacks after her hx. - Never smoker - No drugs - No alcohol     Objective:  Physical Exam BP 137/68  Pulse 86  Temp(Src) 99.1 F (37.3 C) (Oral)  Wt 199 lb (90.266 kg) GEN: NAD, pleasant HEENT: Atraumatic, normocephalic, neck  supple, EOMI, sclera clear, scar across bridge of nose CV: RRR, II/VI systolic murmur PULM: CTAB, normal effort ABD: Soft, nontender, nondistended, NABS, no organomegaly SKIN: No rash or cyanosis; warm and well-perfused; scar right hand EXTR: No lower extremity edema or calf tenderness PSYCH: Mood and affect euthymic, normal rate and volume of speech NEURO: Awake, alert, no focal deficits grossly, normal speech    Assessment:     Lori Jones is a 53 y.o. female with h/o anxiety and depression here to establish care.    Plan:   Schizoaffective disorder, anxiety: Stable, no SI/HI. PHQ-9 score 4 (minimal depression) "somewhat difficult." GAD score 6 (reports feeling afraid something awful will happen after attacked by 3 pit bulls - afraid to walk). - Will find local female psychiatrist list and refer patient to one of these. - Would like to restart medication and therapy. Patient will look up prior medication and call me with this inforamtion. - Requesting records from previous providers, as well ([facility]).   # Health Maintenance - Return for Health Maintenance visit. - Would like to be checked for herpes, though not sexually active in 10 years.  Follow-up: Follow up in when available for health maintenance visit.   [name], MD [place]

## 2013-07-08 NOTE — Patient Instructions (Signed)
Good to meet you to day.  Come back and see me when you are available for a health maintenance visit. In the meantime, I will try to find a psychiatrist for you to see and give you a call. Let me know what medication you used to be on and we can restart it since it worked well.  Best,  [name], MD

## 2013-07-10 ENCOUNTER — Encounter: Payer: Self-pay | Admitting: Family Medicine

## 2013-07-10 DIAGNOSIS — E089 Diabetes mellitus due to underlying condition without complications: Secondary | ICD-10-CM | POA: Insufficient documentation

## 2013-07-10 DIAGNOSIS — E119 Type 2 diabetes mellitus without complications: Secondary | ICD-10-CM | POA: Insufficient documentation

## 2013-07-10 DIAGNOSIS — F259 Schizoaffective disorder, unspecified: Secondary | ICD-10-CM | POA: Insufficient documentation

## 2013-07-10 DIAGNOSIS — R011 Cardiac murmur, unspecified: Secondary | ICD-10-CM | POA: Insufficient documentation

## 2013-07-10 NOTE — Assessment & Plan Note (Signed)
Stable, no SI/HI. PHQ-9 score 4 (minimal depression) "somewhat difficult." GAD score 6 (reports feeling afraid something awful will happen after attacked by 3 pit bulls - afraid to walk). - Will find local female psychiatrist list and refer patient to one of these. - Would like to restart medication and therapy. Patient will look up prior medication and call me with this inforamtion. - Requesting records from previous providers, as well ([facility]).

## 2013-07-30 DIAGNOSIS — Z5189 Encounter for other specified aftercare: Secondary | ICD-10-CM | POA: Diagnosis not present

## 2013-08-06 ENCOUNTER — Encounter: Payer: Self-pay | Admitting: Family Medicine

## 2013-08-06 ENCOUNTER — Ambulatory Visit (HOSPITAL_COMMUNITY)
Admission: RE | Admit: 2013-08-06 | Discharge: 2013-08-06 | Disposition: A | Discharge status: Home / Self Care | Payer: Medicare Other | Source: Ambulatory Visit | Attending: Family Medicine | Admitting: Family Medicine

## 2013-08-06 ENCOUNTER — Ambulatory Visit (INDEPENDENT_AMBULATORY_CARE_PROVIDER_SITE_OTHER): Payer: Medicare Other | Admitting: Family Medicine

## 2013-08-06 VITALS — BP 135/84 | HR 75 | Temp 98.6°F | Ht 67.5 in | Wt 197.0 lb

## 2013-08-06 DIAGNOSIS — E039 Hypothyroidism, unspecified: Secondary | ICD-10-CM | POA: Diagnosis not present

## 2013-08-06 DIAGNOSIS — R209 Unspecified disturbances of skin sensation: Secondary | ICD-10-CM | POA: Insufficient documentation

## 2013-08-06 DIAGNOSIS — R079 Chest pain, unspecified: Secondary | ICD-10-CM

## 2013-08-06 DIAGNOSIS — E119 Type 2 diabetes mellitus without complications: Secondary | ICD-10-CM

## 2013-08-06 DIAGNOSIS — F259 Schizoaffective disorder, unspecified: Secondary | ICD-10-CM

## 2013-08-06 LAB — LIPID PANEL
Cholesterol: 148 mg/dL (ref 0–200)
HDL: 40 mg/dL (ref >39)
LDL Cholesterol: 89 mg/dL (ref 0–99)
Total CHOL/HDL Ratio: 3.7 Ratio
Triglycerides: 94 mg/dL (ref <150)
VLDL: 19 mg/dL (ref 0–40)

## 2013-08-06 LAB — BASIC METABOLIC PANEL
BUN: 10 mg/dL (ref 6–23)
CALCIUM: 8.8 mg/dL (ref 8.4–10.5)
CO2: 22 meq/L (ref 19–32)
CREATININE: 0.75 mg/dL (ref 0.50–1.10)
Chloride: 107 mEq/L (ref 96–112)
GLUCOSE: 154 mg/dL — AB (ref 70–99)
Potassium: 4.2 mEq/L (ref 3.5–5.3)
SODIUM: 140 meq/L (ref 135–145)

## 2013-08-06 LAB — TSH: TSH: 3.568 u[IU]/mL (ref 0.350–4.500)

## 2013-08-06 LAB — POCT GLYCOSYLATED HEMOGLOBIN (HGB A1C): HEMOGLOBIN A1C: 7.5

## 2013-08-06 MED ORDER — METFORMIN HCL 500 MG PO TABS: 500.0000 mg | ORAL_TABLET | Freq: Two times a day (BID) | ORAL | Status: DC

## 2013-08-06 MED ORDER — SIMVASTATIN 20 MG PO TABS: 20.0000 mg | ORAL_TABLET | Freq: Every day | ORAL | Status: DC

## 2013-08-06 MED ORDER — LEVOTHYROXINE SODIUM 25 MCG PO TABS: 25.0000 ug | ORAL_TABLET | Freq: Every day | ORAL | Status: DC

## 2013-08-06 MED ORDER — ESCITALOPRAM OXALATE 10 MG PO TABS: 10.0000 mg | ORAL_TABLET | Freq: Every day | ORAL | Status: DC

## 2013-08-06 MED ORDER — LINAGLIPTIN 5 MG PO TABS: 5.0000 mg | ORAL_TABLET | Freq: Every day | ORAL | Status: DC

## 2013-08-06 NOTE — Assessment & Plan Note (Signed)
DDx with 53 year old diabetic patient includes CAD, though more likely msk or GI given stable vital signs. EKG 10/2012 with no signs of ischemia. No active chest pain. - EKG today>>stable  - Cardiology referral placed. - Return precautions reviewed.

## 2013-08-06 NOTE — Assessment & Plan Note (Signed)
No TSH in system. Does not complain of symptoms of thyroid disease. - Refilled synthroid. - TSH today

## 2013-08-06 NOTE — Patient Instructions (Signed)
We are getting labs today and I will call if any are NOT normal.  We are starting lexapro at 10mg  daily. Call if there are any complications or side effects with this. Follow up with me in 4 weeks to see how the lexapro is doing.  We refilled medications today as well.  I want you to see cardiology in 2-3 weeks.  [name], MD

## 2013-08-06 NOTE — Assessment & Plan Note (Signed)
No A1c in system. Updated medications pt is taking. - Refill metformin and tradjenta. - A1c today>>7.5. - Fasting lipid panel and refilled simvastatin. - Recheck A1c in 3 months after ensure compliance with these medications.

## 2013-08-06 NOTE — Progress Notes (Signed)
Patient ID: Lori Jones, female   DOB: 05/08/1960, 53 y.o.   MRN: [id number] Subjective:   CC: F/u psych and diabetes   HPI:   Schizoaffective disorder Patient reports previously being seen at "[facility]" which she did not like. She was previously taking ativan for anxiety along with lexapro. She needs both refilled. The last time she took lexapro was Sep 2013 and would like to restart it.  Diabetes Takes metformin 500mg  BID and tradjenta 5mg  daily. She needs refills on these. Does not report chest pain, dyspnea, polyuria, polydipsia, or other concerns.  Hypothyroidism Takes synthroid 26mcg daily. Needs refill. Does not complain of symptoms of thyroid disease.  Chest pain Pt reports chest and left hand pain and chest pressure 2 weeks ago that had been present for 1 month. She thought it was related to her neck due to previous neck issues. Denies dizziness, syncope, dyspnea, or nausea. Symptoms worsened with excitement. No longer has these symptoms. Saw a cardiologist (Dr Isaias Sakai) 3 years ago for heart murmur, per pt.  Review of Systems - Per HPI.  Meds: Reviewed. Pt reports she takes tramadol every other day for years, rx'ed by hand surgeon. Smoking status: Never smoker    Objective:  Physical Exam BP 135/84  Pulse 75  Temp(Src) 98.6 F (37 C) (Oral)  Ht 5' 7.5" (1.715 m)  Wt 197 lb (89.359 kg)  BMI 30.38 kg/m2 GEN: NAD HEENT: Atraumatic, normocephalic, neck supple, EOMI, sclera clear, PERRL CV: RRR, no murmurs, rubs, or gallops, 2+ bilateral radial pulses PULM: CTAB, normal effort ABD: Soft, nontender, nondistended, NABS, no organomegaly SKIN: No rash or cyanosis; warm and well-perfused EXTR: No lower extremity edema or calf tenderness PSYCH: Mood and affect euthymic, normal rate and volume of speech NEURO: Awake, alert, no focal deficits grossly, normal speech    Assessment:     Lori Jones is a 53 y.o. female with h/o schizoaffective disorder here for f/u and  discussion of chest pain.    Plan:     Schizoaffective disorder No longer on medication due to changing PCPs and running out. Also would like [place] to help her find a mental health provider. - Will restart lexapro 10mg  daily. Plan to increase to 20mg  daily in 1 week if ineffective. - Provide pt with mental health provider options.  - As pt has been off ativan since Sep 2013 and it is not a good long-term medication, discussed not restarting this. Pt amenable. - F/u in 1 month.  Diabetes No A1c in system. Updated medications pt is taking. - Refill metformin and tradjenta. - A1c today>>7.5. - Fasting lipid panel and refilled simvastatin. - Recheck A1c in 3 months after ensure compliance with these medications.  Hypothyroidism No TSH in system. Does not complain of symptoms of thyroid disease. - Refilled synthroid. - TSH today  Chest pain DDx with 53 year old diabetic patient includes CAD, though more likely msk or GI given stable vital signs. EKG 10/2012 with no signs of ischemia. No active chest pain. - EKG today>>stable  - Cardiology referral placed. - Return precautions reviewed.  Follow-up: Follow up in 1 month for f/u lexapro.   [name], MD [facility]

## 2013-08-06 NOTE — Assessment & Plan Note (Signed)
No longer on medication due to changing PCPs and running out. Also would like [place] to help her find a mental health provider. - Will restart lexapro 10mg  daily. Plan to increase to 20mg  daily in 1 week if ineffective. - Provide pt with mental health provider options.  - As pt has been off ativan since Sep 2013 and it is not a good long-term medication, discussed not restarting this. Pt amenable. - F/u in 1 month.

## 2013-08-24 ENCOUNTER — Telehealth (HOSPITAL_COMMUNITY): Payer: Self-pay | Admitting: Family Medicine

## 2013-08-24 NOTE — Telephone Encounter (Signed)
Lori Jones had asked me for suggestions for psychiatric providers. Please call her with the following:   1. I have spoken with our behavioral psychologist at clinic, Dr Gwenlyn Saran, who has a mood disorders clinic with a psychiatrist ~2 times monthly. She can possibly get patient in in around 1 month, but would need patient to call to discuss / see if she still has openings. Please provide her contact info.  2. The other option is [facility]. Contact info: [phone]  She can decide who she would like to see and let me know.  Thanks!  [name], MD

## 2013-08-24 NOTE — Telephone Encounter (Signed)
Pt informed and agreeable. Lori Jones

## 2013-08-25 ENCOUNTER — Telehealth: Payer: Self-pay | Admitting: Psychology

## 2013-08-25 NOTE — Telephone Encounter (Signed)
[name] called to schedule a [facility] appointment.  Scheduled first available for a new patient which was July 1st at 10:00.  She reported she was seen for seven years in [place] for mental health reasons.  Asked if she were able to get those records.  She said she would come in to sign a ROI so that we might have them for her initial appointment.  The Release of Information should be a request for records that includes behavioral health records.    I told her the following: -  If she is unable to make the appointment she needs to call me. -  If she misses the appointment without a phone call, I won't be able to schedule her back in my clinic. She voiced an understanding and was able to repeat the appointment date and time back to me.

## 2013-09-07 DIAGNOSIS — E119 Type 2 diabetes mellitus without complications: Secondary | ICD-10-CM | POA: Diagnosis not present

## 2013-09-07 DIAGNOSIS — W208XXA Other cause of strike by thrown, projected or falling object, initial encounter: Secondary | ICD-10-CM | POA: Diagnosis not present

## 2013-09-07 DIAGNOSIS — E785 Hyperlipidemia, unspecified: Secondary | ICD-10-CM | POA: Diagnosis not present

## 2013-09-07 DIAGNOSIS — Z888 Allergy status to other drugs, medicaments and biological substances status: Secondary | ICD-10-CM | POA: Diagnosis not present

## 2013-09-07 DIAGNOSIS — R55 Syncope and collapse: Secondary | ICD-10-CM | POA: Diagnosis not present

## 2013-09-07 DIAGNOSIS — Z79899 Other long term (current) drug therapy: Secondary | ICD-10-CM | POA: Diagnosis not present

## 2013-09-07 DIAGNOSIS — IMO0001 Reserved for inherently not codable concepts without codable children: Secondary | ICD-10-CM | POA: Diagnosis not present

## 2013-09-07 DIAGNOSIS — G44319 Acute post-traumatic headache, not intractable: Secondary | ICD-10-CM | POA: Diagnosis not present

## 2013-09-07 DIAGNOSIS — S0990XA Unspecified injury of head, initial encounter: Secondary | ICD-10-CM | POA: Diagnosis not present

## 2013-09-23 ENCOUNTER — Ambulatory Visit (INDEPENDENT_AMBULATORY_CARE_PROVIDER_SITE_OTHER): Payer: Medicare Other | Admitting: Cardiology

## 2013-09-23 ENCOUNTER — Encounter: Payer: Self-pay | Admitting: Cardiology

## 2013-09-23 VITALS — BP 120/70 | HR 89 | Ht 68.0 in | Wt 199.0 lb

## 2013-09-23 DIAGNOSIS — R079 Chest pain, unspecified: Secondary | ICD-10-CM | POA: Diagnosis not present

## 2013-09-23 NOTE — Addendum Note (Signed)
Addended by: Cristopher Estimable on: 09/23/2013 04:55 PM   Modules accepted: Orders

## 2013-09-23 NOTE — Patient Instructions (Signed)
1. We are scheduling A treadmill stress test to be done  On a day that is good for you  2. Your physician recommends that you schedule a follow-up appointment in: as needed

## 2013-09-23 NOTE — Progress Notes (Signed)
HPI The patient presents for evaluation of arm pain. She has no past cardiac history although there is a mention of a heart murmur.  She does also seem to describe a previous stress perfusion study. She has cardiovascular risk factors. She said that about a month ago she was having some left arm pain. This was somewhat constant. She described as a heaviness. However, she hasn't had this in about 2 weeks. She's been able to do such things as push a mower without having any discomfort. She does not describe any new shortness of breath, PND or orthopnea. He does not have palpitations, presyncope or syncope. She has had a chronic history of getting some chest discomfort when she is emotionally stressed but says that this has been a stable pattern. He doesn't describe associated symptoms such as nausea vomiting or diaphoresis.  Allergies  Allergen Reactions  . [name] [Clarithromycin] Palpitations and Other (See Comments)    Also causes Hallucinations  . Imitrex [Sumatriptan] Shortness Of Breath and Palpitations    Feels like having a heart attack.  Claudine Mouton [Canagliflozin-Metformin Hcl]     Current Outpatient Prescriptions  Medication Sig Dispense Refill  . aspirin 81 MG tablet Take 81 mg by mouth daily.      . Biotin 10 MG CAPS Take 1 capsule by mouth 2 (two) times daily.       Marland Kitchen escitalopram (LEXAPRO) 10 MG tablet Take 1 tablet (10 mg total) by mouth daily.  90 tablet  1  . levothyroxine (SYNTHROID, LEVOTHROID) 25 MCG tablet Take 1 tablet (25 mcg total) by mouth daily before breakfast.  90 tablet  1  . linagliptin (TRADJENTA) 5 MG TABS tablet Take 1 tablet (5 mg total) by mouth daily.  90 tablet  1  . metFORMIN (GLUCOPHAGE) 500 MG tablet Take 1 tablet (500 mg total) by mouth 2 (two) times daily with a meal.  180 tablet  1  . simvastatin (ZOCOR) 20 MG tablet Take 1 tablet (20 mg total) by mouth at bedtime.  90 tablet  1  . traMADol (ULTRAM) 50 MG tablet Take 50 mg by mouth every 6 (six) hours  as needed for pain.        No current facility-administered medications for this visit.    Past Medical History  Diagnosis Date  . Diabetes mellitus   . Kidney disease   . Hypothyroid   . Hypercholesteremia   . Back pain, chronic   . Scoliosis   . Herniated disc   . Allergy   . Anemia   . Anxiety   . Arthritis   . Heart murmur     Past Surgical History  Procedure Laterality Date  . Abdominal hysterectomy    . Cesarean section      Family History  Problem Relation Age of Onset  . Cancer Mother   . Depression Mother   . Diabetes Mother   . Heart disease Mother   . Hypertension Mother   . Hyperlipidemia Mother   . Thyroid disease Mother   . Cancer Father   . Sickle cell anemia Brother     History   Social History  . Marital Status: Single    Spouse Name: N/A    Number of Children: N/A  . Years of Education: N/A   Occupational History  . Not on file.   Social History Main Topics  . Smoking status: Never Smoker   . Smokeless tobacco: Not on file  . Alcohol Use: No  .  Drug Use: No  . Sexual Activity: Not on file   Other Topics Concern  . Not on file   Social History Narrative  . No narrative on file    ROS:  Positive for decreased hearing in the left ear, urine frequency, swelling and cramping in her legs, chronic back pain. Otherwise as stated in the HPI and negative for all other systems.  PHYSICAL EXAM BP 120/70  Pulse 89  Ht 5\' 8"  (5.003 m)  Wt 199 lb (90.266 kg)  BMI 30.26 kg/m2 GENERAL:  Well appearing HEENT:  Pupils equal round and reactive, fundi not visualized, oral mucosa unremarkable NECK:  No jugular venous distention, waveform within normal limits, carotid upstroke brisk and symmetric, no bruits, no thyromegaly LYMPHATICS:  No cervical, inguinal adenopathy LUNGS:  Clear to auscultation bilaterally BACK:  No CVA tenderness CHEST:  Unremarkable HEART:  PMI not displaced or sustained,S1 and S2 within normal limits, no S3, no S4, no  clicks, no rubs, no murmurs ABD:  Flat, positive bowel sounds normal in frequency in pitch, no bruits, no rebound, no guarding, no midline pulsatile mass, no hepatomegaly, no splenomegaly EXT:  2 plus pulses throughout, no edema, no cyanosis no clubbing SKIN:  No rashes no nodules NEURO:  Cranial nerves II through XII grossly intact, motor grossly intact throughout PSYCH:  Cognitively intact, oriented to person place and time  EKG:  Sinus rhythm, rate 89, axis within normal limits, intervals within normal limits, no acute ST-T wave changes. 09/23/2013  ASSESSMENT AND PLAN  ARM PAIN:  This is atypical but she does have risk factors. I will bring the patient back for a POET (Plain Old Exercise Test). This will allow me to screen for obstructive coronary disease, risk stratify and very importantly provide a prescription for exercise.  DYSLIPIDEMIA:  Her last LDL was 89 with an HDL of 40. I think this is reasonable with her history of diabetes and she will continue the meds as listed.  DIABETES:  Her A1c was 7.5. I will defer to Conni Slipper, MD

## 2013-09-24 ENCOUNTER — Telehealth (HOSPITAL_COMMUNITY): Payer: Self-pay

## 2013-09-24 DIAGNOSIS — M5090 Cervical disc disorder, unspecified, unspecified cervical region: Secondary | ICD-10-CM | POA: Diagnosis not present

## 2013-09-24 DIAGNOSIS — M4120 Other idiopathic scoliosis, site unspecified: Secondary | ICD-10-CM | POA: Diagnosis not present

## 2013-09-24 DIAGNOSIS — M5126 Other intervertebral disc displacement, lumbar region: Secondary | ICD-10-CM | POA: Diagnosis not present

## 2013-09-28 ENCOUNTER — Encounter (HOSPITAL_COMMUNITY): Payer: Medicare Other

## 2013-09-29 ENCOUNTER — Encounter: Payer: Self-pay | Admitting: Psychology

## 2013-09-29 ENCOUNTER — Ambulatory Visit (INDEPENDENT_AMBULATORY_CARE_PROVIDER_SITE_OTHER): Payer: Medicare Other | Admitting: Psychology

## 2013-09-29 DIAGNOSIS — F259 Schizoaffective disorder, unspecified: Secondary | ICD-10-CM | POA: Diagnosis not present
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
## 2013-09-29 NOTE — Assessment & Plan Note (Signed)
Nabiha is neatly groomed and appropriately dressed.  She maintains good eye contact and is cooperative and attentive.  Speech is normal in tone, rate and rhythm.  Mood is reported as good with a consistent affect.  Thought process is mildly circumstantial.  Denied suicidal or homicidal ideation.  Does not appear to be responding to any internal stimuli.  Able to maintain train of thought and concentrate on the questions.  Specifically: denies thought broadcasting and visual and auditory hallucinations that are NOT religious based.  Reports hearing God's voice.  Does believe she has paranoia.  Given history of traumatic experiences, would also put PTSD on the list.  Did not explore completely today.  Treatment team focused on symptom that appeared to cause the most concern (paranoia).  Per patient report, has never been treated pharmacologically with a medication that might help with this symptom.  Patient is interested in trying a medication.  Treatment discussed Latuda and Abilify - wanting to use a medicine that minimized possible metabolic effects.  Elected to start [place].  Wanted to start at 40 mg but patient expressed concern about side effects.  Will start at 20 mg.  OF NOTE:  Patient will be out of town when she should be followed in [facility].  Asked that she follow with PCP to check on tolerability, safety and efficacy as soon as patient's schedule allows.  Will follow in [place] after that.  Patient interested in therapy.  Medicare insurance may make this difficult.  Provided information to [facility] and [facility].  See patient instructions for further plan.

## 2013-09-29 NOTE — Patient Instructions (Signed)
It was so great meeting you today.  Thanks for coming in and sharing your story with [place].  Please schedule a follow-up for:  July 15th at 9:30. We talked about options to help with your most bothersome symptom.  Dr. Tammi Klippel recommended a medicine called Latuda.  This medicine should not make you feel worse in any way.  If it does, please call me at [phone].   You will need to take this medicine with an evening meal.  This medicine needs food in order for your body to use it.   Side effects MAY include headache, nausea and sense of restlessness.  This medicine is usually a really well tolerated medicine and we don't expect you to have trouble with it.   Take one pill (20 mg) for two weeks and then take two pills (40 mg).   You can go to [email or web address] and read all about it.  Therapy would be a really great option for you as well.  I will give you two handouts for you to explore.

## 2013-10-03 ENCOUNTER — Encounter: Payer: Self-pay | Admitting: Family Medicine

## 2013-10-05 DIAGNOSIS — M542 Cervicalgia: Secondary | ICD-10-CM | POA: Diagnosis not present

## 2013-10-06 ENCOUNTER — Ambulatory Visit (INDEPENDENT_AMBULATORY_CARE_PROVIDER_SITE_OTHER): Payer: Medicare Other | Admitting: Family Medicine

## 2013-10-06 ENCOUNTER — Encounter: Payer: Self-pay | Admitting: Family Medicine

## 2013-10-06 VITALS — BP 146/86 | HR 91 | Temp 98.3°F | Wt 196.5 lb

## 2013-10-06 DIAGNOSIS — F259 Schizoaffective disorder, unspecified: Secondary | ICD-10-CM

## 2013-10-06 DIAGNOSIS — R079 Chest pain, unspecified: Secondary | ICD-10-CM | POA: Diagnosis not present

## 2013-10-06 DIAGNOSIS — E785 Hyperlipidemia, unspecified: Secondary | ICD-10-CM

## 2013-10-06 NOTE — Assessment & Plan Note (Signed)
Actual cholesterol numbers are not bad, but risk is around 8%, signaling that patient with diabetes would benefit from high-intensity statin. -She is resistant to change at this time. -Will continue simvastatin for now. -Work on dietary changes. Gave patient general guidelines to improve diabetes and she is to follow up in one month.

## 2013-10-06 NOTE — Patient Instructions (Signed)
Your A1c is 7.5.  Follow up with me in 1 month to discuss this. In the meantime, continue your medications (metformin and tradjenta) and take a look at the diabetic diet below.  Call Dr Gwenlyn Saran to make a folllow up appointment. Strongly recommend starting latuda 20mg  daily. If you have worsening paranoia or visual or auditory hallucinations, seek immediate care.    Diet Recommendations for Diabetes   Starchy (carb) foods include: Bread, rice, pasta, potatoes, corn, crackers, bagels, muffins, all baked goods.  (Fruits, milk, and yogurt also have carbohydrate, but most of these foods will not spike your blood sugar as the starchy foods will.)  A few fruits do cause high blood sugars; use small portions of bananas (limit to 1/2 at a time), grapes, and most tropical fruits.    Protein foods include: Meat, fish, poultry, eggs, dairy foods, and beans such as pinto and kidney beans (beans also provide carbohydrate).   1. Eat at least 3 meals and 1-2 snacks per day. Never go more than 4-5 hours while awake without eating.  2. Limit starchy foods to TWO per meal and ONE per snack. ONE portion of a starchy  food is equal to the following:   - ONE slice of bread (or its equivalent, such as half of a hamburger bun).   - 1/2 cup of a "scoopable" starchy food such as potatoes or rice.   - 15 grams of carbohydrate as shown on food label.  3. Both lunch and dinner should include a protein food, a carb food, and vegetables.   - Obtain twice as many veg's as protein or carbohydrate foods for both lunch and dinner.   - Fresh or frozen veg's are best.   - Try to keep frozen veg's on hand for a quick vegetable serving.    4. Breakfast should always include protein.

## 2013-10-06 NOTE — Assessment & Plan Note (Signed)
Pain has resolved. Patient is following up with cardiology. -Stress test scheduled for tomorrow.

## 2013-10-06 NOTE — Assessment & Plan Note (Signed)
A1c 7.5, which is reportedly worse than prior 6.2. -Continue tradjenta and metformin. -diabetic diet provided. Encouraged patient to take note of what she eats, and how she can make changes.  -followup in one month for further discussion and planning.

## 2013-10-06 NOTE — Assessment & Plan Note (Addendum)
Per patient, well controlled. Her her report of paranoia, it is unclear if this is warranted by true events or not. She is very resistant to start latuda this time. She does seem functional at my visit with her. -Continue Lexapro 10 mg daily. -Encouraged her to start latuda per mood disorder clinic recommendations. -Patient is to call Dr. Gwenlyn Saran for scheduling close followup. -Follow up with me in one month or sooner as needed. -Encouraged her to call one of the 2 facilities recommended by Dr. Gwenlyn Saran for therapy. -return precautions reviewed.
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
## 2013-10-07 ENCOUNTER — Ambulatory Visit (HOSPITAL_COMMUNITY)
Admission: RE | Admit: 2013-10-07 | Discharge: 2013-10-07 | Disposition: A | Discharge status: Home / Self Care | Payer: Medicare Other | Source: Ambulatory Visit | Attending: Internal Medicine | Admitting: Internal Medicine

## 2013-10-07 DIAGNOSIS — R079 Chest pain, unspecified: Secondary | ICD-10-CM | POA: Diagnosis not present

## 2013-10-07 NOTE — Procedures (Signed)
Exercise Treadmill Test   Test  Exercise Tolerance Test Ordering MD: Marijo File, MD    Unique Test No: 1   Treadmill:  1  Indication for ETT: chest pain - rule out ischemia  Contraindication to ETT: No   Stress Modality: exercise - treadmill  Cardiac Imaging Performed: non   Protocol: standard Bruce - maximal  Max BP:  196/114  Max MPHR (bpm):  166 85% MPR (bpm):  141  MPHR obtained (bpm):  157 % MPHR obtained:  94  Reached 85% MPHR (min:sec):  5:15 Total Exercise Time (min-sec):  8  Workload in METS:  10. Borg Scale: 14  Reason ETT Terminated:  Right shoulder pain    ST Segment Analysis At Rest: NSR, no ST changes   With Exercise: No diagnostic ST changes  Other Information Arrhythmia:  No Angina during ETT:  absent (0) Quality of ETT:  diagnostic  ETT Interpretation:  normal - no evidence of ischemia by ST analysis  Comments: ETT with no chest pain; good exercise capacity; normal BP response; no ST changes; negative adequate ETT. Lori Jones

## 2013-10-07 NOTE — Telephone Encounter (Signed)
Encounter complete. 

## 2013-10-19 DIAGNOSIS — M542 Cervicalgia: Secondary | ICD-10-CM | POA: Diagnosis not present

## 2013-10-19 DIAGNOSIS — M5020 Other cervical disc displacement, unspecified cervical region: Secondary | ICD-10-CM | POA: Diagnosis not present

## 2013-10-28 DIAGNOSIS — M542 Cervicalgia: Secondary | ICD-10-CM | POA: Diagnosis not present

## 2013-11-02 DIAGNOSIS — M542 Cervicalgia: Secondary | ICD-10-CM | POA: Diagnosis not present

## 2013-11-04 ENCOUNTER — Encounter: Payer: Self-pay | Admitting: Family Medicine

## 2013-11-04 ENCOUNTER — Ambulatory Visit (INDEPENDENT_AMBULATORY_CARE_PROVIDER_SITE_OTHER): Payer: Medicare Other | Admitting: Family Medicine

## 2013-11-04 VITALS — BP 130/81 | HR 69 | Temp 98.3°F | Wt 195.0 lb

## 2013-11-04 DIAGNOSIS — E099 Drug or chemical induced diabetes mellitus without complications: Secondary | ICD-10-CM

## 2013-11-04 DIAGNOSIS — R109 Unspecified abdominal pain: Secondary | ICD-10-CM

## 2013-11-04 DIAGNOSIS — E139 Other specified diabetes mellitus without complications: Secondary | ICD-10-CM | POA: Diagnosis not present

## 2013-11-04 DIAGNOSIS — F259 Schizoaffective disorder, unspecified: Secondary | ICD-10-CM

## 2013-11-04 LAB — POCT GLYCOSYLATED HEMOGLOBIN (HGB A1C): HEMOGLOBIN A1C: 7.2

## 2013-11-04 MED ORDER — ESCITALOPRAM OXALATE 10 MG PO TABS: 10.0000 mg | ORAL_TABLET | Freq: Every day | ORAL | Status: DC

## 2013-11-04 NOTE — Patient Instructions (Signed)
Good to see you.  Diabetes Please follow up with me in 1.5 months to discuss diet. In the meantime, we discussed working on your diet by cutting back on sweets. See the general diabetic diet guideline below. Also, [facility] so you can add to your 30 min per day 5x per week walking schedule.  Schizoaffective disorder Continue lexapro and follow up as scheduled with Dr Gwenlyn Saran. If you have worsening paranoias or other symptoms, please seek immediate care.  Abdominal pain This may be irritable bowel syndrome. You can try pepto bismol, tums, or rolaids before eating cabbage if you know you are going to eat it, since this helped last night. If pain worsens or you have fevers, chills, unintentional weight loss, diarrhea, vomiting, or blood in your stool, seek immediate care.   Diet Recommendations for Diabetes   Starchy (carb) foods include: Bread, rice, pasta, potatoes, corn, crackers, bagels, muffins, all baked goods.  (Fruits, milk, and yogurt also have carbohydrate, but most of these foods will not spike your blood sugar as the starchy foods will.)  A few fruits do cause high blood sugars; use small portions of bananas (limit to 1/2 at a time), grapes, and most tropical fruits.    Protein foods include: Meat, fish, poultry, eggs, dairy foods, and beans such as pinto and kidney beans (beans also provide carbohydrate).   1. Eat at least 3 meals and 1-2 snacks per day. Never go more than 4-5 hours while awake without eating.  2. Limit starchy foods to TWO per meal and ONE per snack. ONE portion of a starchy  food is equal to the following:   - ONE slice of bread (or its equivalent, such as half of a hamburger bun).   - 1/2 cup of a "scoopable" starchy food such as potatoes or rice.   - 15 grams of carbohydrate as shown on food label.  3. Both lunch and dinner should include a protein food, a carb food, and vegetables.   - Obtain twice as many veg's as protein or carbohydrate  foods for both lunch and dinner.   - Fresh or frozen veg's are best.   - Try to keep frozen veg's on hand for a quick vegetable serving.    4. Breakfast should always include protein.    Irritable Bowel Syndrome Irritable bowel syndrome (IBS) is caused by a disturbance of normal bowel function and is a common digestive disorder. You may also hear this condition called spastic colon, mucous colitis, and irritable colon. There is no cure for IBS. However, symptoms often gradually improve or disappear with a good diet, stress management, and medicine. This condition usually appears in late adolescence or early adulthood. Women develop it twice as often as men. CAUSES  After food has been digested and absorbed in the small intestine, waste material is moved into the large intestine, or colon. In the colon, water and salts are absorbed from the undigested products coming from the small intestine. The remaining residue, or fecal material, is held for elimination. Under normal circumstances, gentle, rhythmic contractions of the bowel walls push the fecal material along the colon toward the rectum. In IBS, however, these contractions are irregular and poorly coordinated. The fecal material is either retained too long, resulting in constipation, or expelled too soon, producing diarrhea. SIGNS AND SYMPTOMS  The most common symptom of IBS is abdominal pain. It is often in the lower left side of the abdomen, but it may  occur anywhere in the abdomen. The pain comes from spasms of the bowel muscles happening too much and from the buildup of gas and fecal material in the colon. This pain:  Can range from sharp abdominal cramps to a dull, continuous ache.  Often worsens soon after eating.  Is often relieved by having a bowel movement or passing gas. Abdominal pain is usually accompanied by constipation, but it may also produce diarrhea. The diarrhea often occurs right after a meal or upon waking up in the morning.  The stools are often soft, watery, and flecked with mucus. Other symptoms of IBS include:  Bloating.  Loss of appetite.  Heartburn.  Backache.  Dull pain in the arms or shoulders.  Nausea.  Burping.  Vomiting.  Gas. IBS may also cause symptoms that are unrelated to the digestive system, such as:  Fatigue.  Headaches.  Anxiety.  Shortness of breath.  Trouble concentrating.  Dizziness. These symptoms tend to come and go. DIAGNOSIS  The symptoms of IBS may seem like symptoms of other, more serious digestive disorders. Your health care provider may want to perform tests to exclude these disorders.  TREATMENT Many medicines are available to help correct bowel function or relieve bowel spasms and abdominal pain. Among the medicines available are:  Laxatives for severe constipation and to help restore normal bowel habits.  Specific antidiarrheal medicines to treat severe or lasting diarrhea.  Antispasmodic agents to relieve intestinal cramps. Your health care provider may also decide to treat you with a mild tranquilizer or sedative during unusually stressful periods in your life. Your health care provider may also prescribe antidepressant medicine. The use of this medicine has been shown to reduce pain and other symptoms of IBS. Remember that if any medicine is prescribed for you, you should take it exactly as directed. Make sure your health care provider knows how well it worked for you. HOME CARE INSTRUCTIONS   Take all medicines as directed by your health care provider.  Avoid foods that are high in fat or oils, such as heavy cream, butter, frankfurters, sausage, and other fatty meats.  Avoid foods that make you go to the bathroom, such as fruit, fruit juice, and dairy products.  Cut out carbonated drinks, chewing gum, and "gassy" foods such as beans and cabbage. This may help relieve bloating and burping.  Eat foods with bran, and drink plenty of liquids with the  bran foods. This helps relieve constipation.  Keep track of what foods seem to bring on your symptoms.  Avoid emotionally charged situations or circumstances that produce anxiety.  Start or continue exercising.  Get plenty of rest and sleep. Document Released: 03/18/2005 Document Revised: 03/23/2013 Document Reviewed: 11/06/2007 [facility] Patient Information 2015 [place], [place]. This information is not intended to replace advice given to you by your health care provider. Make sure you discuss any questions you have with your health care provider.

## 2013-11-04 NOTE — Progress Notes (Signed)
Patient ID: Lori Jones, female   DOB: 25-Nov-1960, 53 y.o.   MRN: [id number] Subjective:   CC:Follow up diabetes and schizoaffective disorder. Also discuss abdominal pain.  HPI:   Abdominal pain Patient has had chronic abdominal pain that she has lately been noticing. This only occurs when she eats cabbage. It has been painful to the point of making her cry herself to sleep but is gone when she wakes. She has had some nausea but denies vomiting, diarrhea, bloody stool, constipation, fevers, chills. Pain is worse in upper abdomen and crampy. She tried pepto bismol last night which greatly helped. No undesired weight loss or night sweats. No dysuria.  Diabetes follow up Taking metformin BID and tradjenta at night. Complains of polyuria and polydypsia. Reports intermittent left foot pain but no numbness/tingling or vision changes. Denies chest pain or dyspnea. Checks feet occasionally and reports no foot ulcers or skin breakdown. Exercise: walks 30 min daily 5x/week. Diet: last 24 hours:  Breakfast - oatmeal, milk, boiled egg, toast 1 slice, 1 glass OJ Snack: Oatmeal cream pie Lunch: PB/jelly, Koolaid (1x/wek) Snack: Frosty from [employer] Dinner: Cabbage, rice, mac cheese, cornbread, water  Schizoaffective disorder Takes lexapro 10mg  daily Did not start latuda Denies AH/VH No further episodes of the paranoia of someone following her No worse depression in the last month. She does not wish to start latuda since she feels her symptoms are well controlled.   Review of Systems - Per HPI. Additionally, patient had stress test 7/7 due to report at last visit of chest pain. She has not had any further episodes of CP or dyspnea.  SH: Diet reviewed above    Objective:  Physical Exam BP 130/81  Pulse 69  Temp(Src) 98.3 F (36.8 C) (Oral)  Wt 195 lb (88.451 kg) GEN: NAD CV: RRR, no m/r/g PULM: CTAB, normal effort, no wheezes or crackles ABD: Mild LLQ and epigastric tenderness, no rebound  or guarding, no CVA tenderness EXTR: No LE edema PSYCH: Mood and affect mildly flat though euthymic, no SI/HI, no AH/VH, no paranoias reported today, goal-directed though, speech normal rate and volume    Assessment:     Lori Jones is a 53 y.o. female with h/o schizoaffective disorder and diabetes here for follow-up and with abdominal pain.    Plan:     Abdominal pain Likely indigestion, gas, or IBS with one food being the culprit (cabbage). Improved greatly with pepto bismol. No fevers, chills, vomiting, or diarrhea to raise concern for infection. Consider gastritis, ulcer, or hepatobiliary cause if symptoms do not resolve. - Recommended continuing pepto bismol, tums, or rolaids before eating cabbage if she cannot avoid cabbage. - F/u if worse or any red flag symptoms. If persistent, would check CMET, lipase, CBC.  Diabetes follow up A1c slightly improved to 7.2 from 7.5. For her age, we can still get better control. Patient identifies areas of her diet that she could improve. - [facility] Continue walking 30 min 5 days per week. - she had 6 carb foods yesterday. She plans to cut back, especially on the high sugar foods. - printed diabetic diet - Discuss diettitan at f/u in 1.5 mo. - Plans to call for diabetic ophthalmology visit and have them fax [place] - Mentioned burning foot pain. If worsens, we can trial gabapentin. - Diabetic foot exam at f/u if not done  Schizoaffective disorder follow up Appears well-controlled with no further episodes of paranoia that she admits to. I feel she  could have even better control on latuda which was recommended by mood disorders clinic (Drs Lesle Reek and Gwenlyn Saran) however she is resistant. - Refill lexapro today. - Has f/u with Dr Gwenlyn Saran 12th for Mood D/o clinic recs - Return precautions reviewed.  # Health Maintenance: Not discussed.  - Prior episode of CP: resolved. F/u stress test performed last month.  Follow-up: Follow up in 1.5 months  for f/u of diet control.   [name], MD [place]

## 2013-11-05 DIAGNOSIS — R109 Unspecified abdominal pain: Secondary | ICD-10-CM | POA: Insufficient documentation

## 2013-11-05 DIAGNOSIS — M542 Cervicalgia: Secondary | ICD-10-CM | POA: Diagnosis not present

## 2013-11-05 NOTE — Assessment & Plan Note (Signed)
Appears well-controlled with no further episodes of paranoia that she admits to. I feel she could have even better control on latuda which was recommended by mood disorders clinic (Drs Lesle Reek and Gwenlyn Saran) however she is resistant. - Refill lexapro today. - Has f/u with Dr Gwenlyn Saran 12th for Mood D/o clinic recs - Return precautions reviewed.

## 2013-11-05 NOTE — Assessment & Plan Note (Signed)
Likely indigestion, gas, or IBS with one food being the culprit (cabbage). Improved greatly with pepto bismol. No fevers, chills, vomiting, or diarrhea to raise concern for infection. Consider gastritis, ulcer, or hepatobiliary cause if symptoms do not resolve. - Recommended continuing pepto bismol, tums, or rolaids before eating cabbage if she cannot avoid cabbage. - F/u if worse or any red flag symptoms. If persistent, would check CMET, lipase, CBC.

## 2013-11-05 NOTE — Assessment & Plan Note (Signed)
A1c slightly improved to 7.2 from 7.5. For her age, we can still get better control. Patient identifies areas of her diet that she could improve. - [facility] Continue walking 30 min 5 days per week. - she had 6 carb foods yesterday. She plans to cut back, especially on the high sugar foods. - printed diabetic diet - Discuss diettitan at f/u in 1.5 mo. - Plans to call for diabetic ophthalmology visit and have them fax [place] - Mentioned burning foot pain. If worsens, we can trial gabapentin. - Diabetic foot exam at f/u if not done

## 2013-11-09 DIAGNOSIS — M542 Cervicalgia: Secondary | ICD-10-CM | POA: Diagnosis not present

## 2013-11-09 DIAGNOSIS — G894 Chronic pain syndrome: Secondary | ICD-10-CM | POA: Diagnosis not present

## 2013-11-09 DIAGNOSIS — Z79899 Other long term (current) drug therapy: Secondary | ICD-10-CM | POA: Diagnosis not present

## 2013-11-09 DIAGNOSIS — M545 Low back pain, unspecified: Secondary | ICD-10-CM | POA: Diagnosis not present

## 2013-11-10 ENCOUNTER — Ambulatory Visit (INDEPENDENT_AMBULATORY_CARE_PROVIDER_SITE_OTHER): Payer: Medicare Other | Admitting: Psychology

## 2013-11-10 DIAGNOSIS — F259 Schizoaffective disorder, unspecified: Secondary | ICD-10-CM
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
## 2013-11-10 NOTE — Patient Instructions (Signed)
It was good to see you today Lori Jones.  I am glad you are doing well on the medicine.  Dr. Darene Lamer can continue to work with you on your medications.  If you or she ever feels like you need our help in the future, we would be happy to see you.  Therapy could be very helpful for you.  Two resources that might be helpful include:   [facility] [place]:  [facility]:  [phone]

## 2013-11-10 NOTE — Assessment & Plan Note (Addendum)
Mood report is euthymic.  Affect is consistent.  Less circumstantial today.  Treatment team has concerns that there is paranoid ideation that interferes with relationships however, the patient does not share this view nor does she wish any changes in her medication.    At this point, Lori Jones can follow with Dr. Darene Lamer unless something else surfaces.  Therapy resources were reviewed.  She was confused last time as to whether she should call or they would call her.  Told her she should call to schedule an appointment.  May need a referral from Dr. Darene Lamer but they can request that.    See patient instructions for further plan.    30 minutes of face to face contact / coordination of care.

## 2013-11-11 DIAGNOSIS — M542 Cervicalgia: Secondary | ICD-10-CM | POA: Diagnosis not present

## 2013-11-18 DIAGNOSIS — M542 Cervicalgia: Secondary | ICD-10-CM | POA: Diagnosis not present

## 2013-12-07 DIAGNOSIS — M5030 Other cervical disc degeneration, unspecified cervical region: Secondary | ICD-10-CM | POA: Diagnosis not present

## 2013-12-07 DIAGNOSIS — M545 Low back pain, unspecified: Secondary | ICD-10-CM | POA: Diagnosis not present

## 2013-12-07 DIAGNOSIS — M47812 Spondylosis without myelopathy or radiculopathy, cervical region: Secondary | ICD-10-CM | POA: Diagnosis not present

## 2013-12-07 DIAGNOSIS — Z79899 Other long term (current) drug therapy: Secondary | ICD-10-CM | POA: Diagnosis not present

## 2013-12-07 DIAGNOSIS — G894 Chronic pain syndrome: Secondary | ICD-10-CM | POA: Diagnosis not present

## 2013-12-07 DIAGNOSIS — M79609 Pain in unspecified limb: Secondary | ICD-10-CM | POA: Diagnosis not present

## 2013-12-17 ENCOUNTER — Encounter: Payer: Self-pay | Admitting: Family Medicine

## 2013-12-17 ENCOUNTER — Ambulatory Visit (INDEPENDENT_AMBULATORY_CARE_PROVIDER_SITE_OTHER): Payer: Medicare Other | Admitting: Family Medicine

## 2013-12-17 VITALS — BP 133/86 | HR 83 | Temp 98.4°F | Wt 191.0 lb

## 2013-12-17 DIAGNOSIS — R109 Unspecified abdominal pain: Secondary | ICD-10-CM | POA: Diagnosis not present

## 2013-12-17 DIAGNOSIS — F259 Schizoaffective disorder, unspecified: Secondary | ICD-10-CM | POA: Diagnosis not present

## 2013-12-17 DIAGNOSIS — M25539 Pain in unspecified wrist: Secondary | ICD-10-CM | POA: Diagnosis not present

## 2013-12-17 DIAGNOSIS — M25532 Pain in left wrist: Secondary | ICD-10-CM | POA: Insufficient documentation

## 2013-12-17 LAB — COMPREHENSIVE METABOLIC PANEL
ALBUMIN: 4.3 g/dL (ref 3.5–5.2)
ALT: 28 U/L (ref 0–35)
AST: 25 U/L (ref 0–37)
Alkaline Phosphatase: 75 U/L (ref 39–117)
BUN: 13 mg/dL (ref 6–23)
CALCIUM: 9.5 mg/dL (ref 8.4–10.5)
CHLORIDE: 107 meq/L (ref 96–112)
CO2: 25 meq/L (ref 19–32)
Creat: 0.83 mg/dL (ref 0.50–1.10)
Glucose, Bld: 122 mg/dL — ABNORMAL HIGH (ref 70–99)
POTASSIUM: 4 meq/L (ref 3.5–5.3)
Sodium: 141 mEq/L (ref 135–145)
TOTAL PROTEIN: 6.8 g/dL (ref 6.0–8.3)
Total Bilirubin: 0.8 mg/dL (ref 0.2–1.2)

## 2013-12-17 LAB — CBC
HCT: 37.3 % (ref 36.0–46.0)
Hemoglobin: 12.5 g/dL (ref 12.0–15.0)
MCH: 25.4 pg — AB (ref 26.0–34.0)
MCHC: 33.5 g/dL (ref 30.0–36.0)
MCV: 75.7 fL — ABNORMAL LOW (ref 78.0–100.0)
PLATELETS: 232 10*3/uL (ref 150–400)
RBC: 4.93 MIL/uL (ref 3.87–5.11)
RDW: 14.1 % (ref 11.5–15.5)
WBC: 5.1 10*3/uL (ref 4.0–10.5)

## 2013-12-17 LAB — LIPASE: LIPASE: 20 U/L (ref 0–75)
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
## 2013-12-17 NOTE — Assessment & Plan Note (Signed)
Persistent, worse with fatty foods, told she had gallstone int he past. Afebrile with normal vitals. "Knot" in stomach occurs when she sits up and is likely diastasis recti. - CBC, CMET, Lipase today. - Avoid fatty foods, continue pepto bismol which helps. - Return prn severe symptoms like fevers or severe abdominal pain. - Asked pt to bring paperwork with information from EGD done years ago.

## 2013-12-17 NOTE — Assessment & Plan Note (Signed)
Pt has not yet scheduled therapy. - Call to schedule therapy with Dr Gwenlyn Saran.
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
## 2014-01-19 ENCOUNTER — Ambulatory Visit (INDEPENDENT_AMBULATORY_CARE_PROVIDER_SITE_OTHER): Payer: Medicare Other | Admitting: Family Medicine

## 2014-01-19 ENCOUNTER — Encounter: Payer: Self-pay | Admitting: Family Medicine

## 2014-01-19 VITALS — BP 136/87 | HR 80 | Wt 193.0 lb

## 2014-01-19 DIAGNOSIS — R0789 Other chest pain: Secondary | ICD-10-CM

## 2014-01-19 DIAGNOSIS — E119 Type 2 diabetes mellitus without complications: Secondary | ICD-10-CM

## 2014-01-19 NOTE — Progress Notes (Signed)
Patient ID: Lori Jones, female   DOB: 1961/03/16, 53 y.o.   MRN: [id number] Subjective:   CC: Chest pain  HPI:   Chest pain Saturday she felt like she was having a stroke because she had chest, left neck, and left arm pain and chest started to get tight. She thinks this is related to anxiety because the more anxious she got, the worse she felt, and once she calmed herself down, the chest pain resolved (after 30 minutes). This morning at 2:30 AM she again had chest pain and feels this was due to her getting annoyed at herself. She calmed herself down again and felt better, so she went to bed. She has had similar chest pain in the past and had a stress test recently (July) that was normal. Denied diaphoresis, nausea, dyspnea, changes in vision, hearing, gait, speech, or current pain. She did feel slightly dizzy but again thinks it was her anxiety. She also takes simvastatin daily. For her anxiety, she takes lexapro but reports only taking it 2-3 times weekly. BP is always normal when she checks at [employer] (120/80), when she is calm.  Review of Systems - Per HPI.  Smoking status: Never smoker    Objective:  Physical Exam BP 136/87  Pulse 80  Wt 193 lb (87.544 kg) GEN: NAD, seated in exam room CV: RRR, no m/r/g heard today PULM: CTAB, normal effort ABD: S/NT/ND EXTR: No LE edema or calf tenderness CHEST: Mild reproducibility of chest pain on palpation PSYCH: Mood and affect euthymic today    Assessment:     Lori Jones is a 53 y.o. female with h/o schizoaffective disorder, anxiety and depression per patient, hypothyroidism, HLD, diabetes, and chest pain here for discussion of chest pain.    Plan:     # See problem list and after visit summary for problem-specific plans.  # Health Maintenance: Declined flu shot  Follow-up: Follow up in 1-2 months for f/u of chest pain if not resolved.   [name], MD [place]

## 2014-01-19 NOTE — Patient Instructions (Signed)
I think your chest pain is most likely related to anxiety. You had a normal stress test by Dr Percival Spanish. - Call Dr Gwenlyn Saran to follow up for therapy. - Follow up also with the psychiatrist she wanted you to see. - Take your lexapro as prescribed. - I am referring you for dietary counseling with Dr Jenne Campus. Call her for appointment. - Follow up with Dr Percival Spanish if you are still concerned about your chest pain. - Work on anxiety per above.  If you have worsened symptoms, trouble breathing, dizziness, fainting, or other concerns, seek immediate care.  [name], MD

## 2014-01-20 NOTE — Assessment & Plan Note (Addendum)
Very likely related to anxiety or MSK with reproducibility on palpation. She has had negative stress test July 2015. Sees cardiology. BP per pt usually normal.  - Discussed working on anxiety. Patient would like to see therapist. Recommended she call Dr Gwenlyn Saran again and follow up with provider Dr Gwenlyn Saran recommended. - Increase lexapro to daily. - Call to f/u with Dr Percival Spanish (cardiology). - Referral placed for dietary education to work on reducing cardiovascular risk and asked pt to call Dr Jenne Campus. Continue statin. BP controlled at home, likely white coat HTN today. - Return precautions reviewed.

## 2014-01-28 NOTE — Addendum Note (Signed)
Addended by: Conni Slipper T on: 01/28/2014 08:27 AM   Modules accepted: Orders

## 2014-01-30 ENCOUNTER — Other Ambulatory Visit: Payer: Self-pay | Admitting: Family Medicine

## 2014-02-01 ENCOUNTER — Ambulatory Visit (INDEPENDENT_AMBULATORY_CARE_PROVIDER_SITE_OTHER): Payer: Medicare Other | Admitting: Family Medicine

## 2014-02-01 ENCOUNTER — Encounter: Payer: Self-pay | Admitting: Family Medicine

## 2014-02-01 VITALS — Ht 68.0 in | Wt 193.9 lb

## 2014-02-01 DIAGNOSIS — E119 Type 2 diabetes mellitus without complications: Secondary | ICD-10-CM

## 2014-02-01 DIAGNOSIS — E669 Obesity, unspecified: Secondary | ICD-10-CM | POA: Diagnosis not present
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
## 2014-02-01 NOTE — Patient Instructions (Addendum)
  -   You can make an appt with our PharmD Dr. Valentina Lucks to go over the use of your meter.  - I would strongly recommend that you start checking at least your fasting blood sugar.    Diet Recommendations for Diabetes  Starchy (carb) foods include: Bread, rice, pasta, potatoes, corn, crackers, bagels, muffins, all baked goods.  (Fruits, milk, and yogurt also have carbohydrate, but most of these foods will not spike your blood sugar as the starchy foods will.)  A few fruits do cause high blood sugars; use small portions of bananas (limit to 1/2 at a time), grapes, and most tropical fruits, and watermelon.    Protein foods include: Meat, fish, poultry, eggs, dairy foods, and beans such as pinto and kidney beans (beans also provide carbohydrate).   1. Eat at least 3 meals and 1-2 snacks per day. Never go more than 4-5 hours while awake without eating. Eat breakfast within the first hour of getting up.   2. Limit starchy foods to TWO per meal and ONE per snack. ONE portion of a starchy  food is equal to the following:   - ONE slice of bread (or its equivalent, such as half of a hamburger bun).   - 1/2 cup of a "scoopable" starchy food such as potatoes or rice.   - 15 grams of carbohydrate as shown on food label.  3. Include at every meal: a protein food, a carb food, and vegetables and/or fruit.   - Obtain twice as many veg's as protein or carbohydrate foods for both lunch and dinner.   - Fresh or frozen veg's are best.   - Try to keep frozen veg's on hand for a quick vegetable serving.    - If you use canned soup, ADD frozen veg's for a more nutritious soup.  Choose the soup with the least sodium you can find.   4. Check blood sugars daily, at least fasting.  Record your blood sugars on form provided.   5. Minimize o.j. to no more than 4 oz at a time.    - If you have Qs, call Dr. Jenne Campus at [phone].

## 2014-02-03 ENCOUNTER — Encounter: Payer: Self-pay | Admitting: Pharmacist

## 2014-02-03 ENCOUNTER — Ambulatory Visit (INDEPENDENT_AMBULATORY_CARE_PROVIDER_SITE_OTHER): Payer: Medicare Other | Admitting: Pharmacist

## 2014-02-03 VITALS — BP 134/87 | HR 59 | Ht 68.0 in | Wt 195.5 lb

## 2014-02-03 DIAGNOSIS — E089 Diabetes mellitus due to underlying condition without complications: Secondary | ICD-10-CM | POA: Diagnosis not present

## 2014-02-03 MED ORDER — METFORMIN HCL 500 MG PO TABS: 1000.0000 mg | ORAL_TABLET | Freq: Two times a day (BID) | ORAL | Status: DC

## 2014-02-03 NOTE — Assessment & Plan Note (Addendum)
Patient is meeting goals for blood glucose control at this time. Reports adherence with medication.  Denies hypoglycemic events and is able to verbalize appropriate hypoglycemia management plan.  Provided detailed education on current drug therapy and discussed short-term goals including blood sugar and weight.  No change in drug therapy at this time. Next A1C anticipated at next follow up January 2016.  Written patient instructions provided.  Follow up in Pharmacist Clinic Visit early 2016.   Total time in face to face counseling 15 minutes.  Patient seen with Angela Burke, PharmD Candidate and Hassie Bruce,  PharmD Resident.   Agree

## 2014-02-03 NOTE — Progress Notes (Signed)
S:    Patient arrives walking without assistance with a good mood.  Presents for diabetes currently well controlled on metformin and Trajenta.  Patient has taken medication for several years.  Patient is well controlled on current regimen with recent A1cs of 7.5 and 7.1, however, presents for education and to further lower A1c and control diabetes by modifying her diet and increasing exercise.   O:  Lab Results  Component Value Date   HGBA1C 7.2 11/04/2013    home CBG readings consistently between 80-150  A/P: Patient is meeting goals for blood glucose control at this time. Reports adherence with medication.  Denies hypoglycemic events and is able to verbalize appropriate hypoglycemia management plan.  Provided detailed education on current drug therapy and discussed short-term goals including blood sugar and weight.  No change in drug therapy at this time. Next A1C anticipated at next follow up January 2016.  Written patient instructions provided.  Follow up in Pharmacist Clinic Visit early 2016.   Total time in face to face counseling 15 minutes.  Patient seen with Angela Burke, PharmD Candidate and Hassie Bruce,  PharmD Resident.

## 2014-02-03 NOTE — Patient Instructions (Addendum)
Thank you for visiting [place]!  Increase your exercise by walking and biking more.  Diet and exercise will greatly contribute to helping your blood sugars and A1c.  No adjustment of your medications are necessary at this time. Continue taking Metformin 1000mg  twice daily AND Trajenta 5mg  daily.

## 2014-02-03 NOTE — Progress Notes (Signed)
Patient ID: Lori Jones, female   DOB: 1960/04/03, 53 y.o.   MRN: [id number] Reviewed: Agree with Dr. Graylin Shiver documentation and management.

## 2014-02-13 ENCOUNTER — Other Ambulatory Visit: Payer: Self-pay | Admitting: Family Medicine

## 2014-02-28 DIAGNOSIS — Z01419 Encounter for gynecological examination (general) (routine) without abnormal findings: Secondary | ICD-10-CM | POA: Diagnosis not present

## 2014-03-01 DIAGNOSIS — Z1231 Encounter for screening mammogram for malignant neoplasm of breast: Secondary | ICD-10-CM | POA: Diagnosis not present

## 2014-03-15 ENCOUNTER — Encounter: Payer: Self-pay | Admitting: Family Medicine

## 2014-03-15 ENCOUNTER — Ambulatory Visit (INDEPENDENT_AMBULATORY_CARE_PROVIDER_SITE_OTHER): Payer: Medicare Other | Admitting: Family Medicine

## 2014-03-15 VITALS — Ht 68.0 in | Wt 195.9 lb

## 2014-03-15 DIAGNOSIS — E669 Obesity, unspecified: Secondary | ICD-10-CM

## 2014-03-15 DIAGNOSIS — E089 Diabetes mellitus due to underlying condition without complications: Secondary | ICD-10-CM | POA: Diagnosis not present

## 2014-03-15 NOTE — Patient Instructions (Signed)
-   LOOK FOR YOUR GLUCOMETER TODAY!  START CHECKING YOUR FASTING BLOOD SUGARS AGAIN TOMORROW.    - Check and record your fasting blood sugars each day.    Same goals as before:  Diet Recommendations for Diabetes   Starchy (carb) foods include: Bread, rice, pasta, potatoes, corn, crackers, bagels, muffins, all baked goods.  (Fruits, milk, and yogurt also have carbohydrate, but most of these foods will not spike your blood sugar as the starchy foods will.)  A few fruits do cause high blood sugars; use small portions of bananas (limit to 1/2 at a time), grapes, watermelon, and most tropical fruits.    Protein foods include: Meat, fish, poultry, eggs, dairy foods, and beans such as pinto and kidney beans (beans also provide carbohydrate).   1. Eat at least 3 meals and 1-2 snacks per day. Never go more than 4-5 hours while awake without eating. Eat breakfast within the first hour of getting up.   2. Limit starchy foods to TWO per meal and ONE per snack. ONE portion of a starchy  food is equal to the following:   - ONE slice of bread (or its equivalent, such as half of a hamburger bun).   - 1/2 cup of a "scoopable" starchy food such as potatoes or rice.   - 15 grams of carbohydrate as shown on food label.  3. Include at every lunch and dinner: a protein food, a carb food, and vegetables.   - Obtain twice as many veg's as protein or carbohydrate foods for both lunch and dinner.   - Fresh or frozen veg's are best.   - Try to keep frozen veg's on hand for a quick vegetable serving.      - Exercise goal:  At least 30 min 4 X wk.

## 2014-03-15 NOTE — Progress Notes (Signed)
Medical Nutrition Therapy:  Appt start time: 1100 end time:  1200.  Assessment:  Primary concerns today: Weight management and Blood sugar control.   Learning Readiness: Change in progress.  Barriers to learning/adherence to lifestyle change: financial constraints.  Yuette has misplaced her glucometer, so has not checked her BG since visiting her daughter over Th'giving.  Prior to that, they were in the 140s.  Cheryal said her stress levels are high, possibly helping to elevate BG.  She is dealing with some conflicts at her church.    She has been trying to eat more veg's and is walking 30 min ~3 X wk.  Mckynna is only taking 500 mg Metformin bid, not the 1000 as prescribed.  She did not understand that she should be taking TWO pills each time.  I recommended she make an appt with Dr. Dianah Field to discuss this as well as her desire to discontinue simvastatin.  I told her her cholesterol is currently low probably B/C she is taking a statin, but that it is also reasonable to try to lower the dose, and eventually get off the med IF she can control her lipids without it.    24-hr recall:  (Up at 8 AM) B (10 AM)-  1 pkt inst flavored oatmeal, 1 boiled egg, 1 slc toast, 1/2 tsp jelly, 3 oz frt juice Snk ( AM)-  water L (4 PM)-  2 oz [place], 1/2 c dressing, 1/2 c mashed pot's, 1 tbsp gravy, 1/2 c grn beans, water Snk ( PM)-   D (8 PM)-  3 oz [place], 1/2 c dressing, 1/2 c mashed pot's, 1 tbsp gravy, 1/2 c grn beans, water Snk ( PM)-  1/2 c ice cream Typical day? Yes.   except that lunch is usually ~12 to 2 PM, and dinner no later than 7.  Gets dessert ~5-6 X wk.    Progress Towards Goal(s):  In progress.   Nutritional Diagnosis:  NI-5.8.3 Inappropriate intake of types of carbohydrates (specify):   As related to orange juice and inadequate vegetables.  As evidenced by daily 1 cup of juice and small amts of vegetables only once a day.    Intervention:  Nutrition education.  Handouts given during  visit include:  AVS  BG log   Demonstrated degree of understanding via:  Teach Back   Monitoring/Evaluation:  Dietary intake, exercise, BG, and body weight in 6 week(s).

## 2014-03-21 ENCOUNTER — Ambulatory Visit (INDEPENDENT_AMBULATORY_CARE_PROVIDER_SITE_OTHER): Payer: Medicare Other | Admitting: Family Medicine

## 2014-03-21 VITALS — BP 154/93 | HR 86 | Temp 98.8°F | Ht 68.0 in | Wt 195.8 lb

## 2014-03-21 DIAGNOSIS — E089 Diabetes mellitus due to underlying condition without complications: Secondary | ICD-10-CM

## 2014-03-21 MED ORDER — METFORMIN HCL 500 MG PO TABS: 500.0000 mg | ORAL_TABLET | Freq: Two times a day (BID) | ORAL | Status: DC

## 2014-03-21 NOTE — Patient Instructions (Signed)
Good to see you.  For your metformin, keep taking 1 tablet twice daily. Follow up in January for your diabetes.  For your cholesterol, take 2 tablets of the simvastatin daily. When you run out, let me know and I will represcribe the higher strength medication.  Work on diet and exercise regularly.  Best,  [name], MD  DASH Eating Plan DASH stands for "Dietary Approaches to Stop Hypertension." The DASH eating plan is a healthy eating plan that has been shown to reduce high blood pressure (hypertension). Additional health benefits may include reducing the risk of type 2 diabetes mellitus, heart disease, and stroke. The DASH eating plan may also help with weight loss. WHAT DO I NEED TO KNOW ABOUT THE DASH EATING PLAN? For the DASH eating plan, you will follow these general guidelines:  Choose foods with a percent daily value for sodium of less than 5% (as listed on the food label).  Use salt-free seasonings or herbs instead of table salt or sea salt.  Check with your health care provider or pharmacist before using salt substitutes.  Eat lower-sodium products, often labeled as "lower sodium" or "no salt added."  Eat fresh foods.  Eat more vegetables, fruits, and low-fat dairy products.  Choose whole grains. Look for the word "whole" as the first word in the ingredient list.  Choose fish and skinless chicken or [place] more often than red meat. Limit fish, poultry, and meat to 6 oz (170 g) each day.  Limit sweets, desserts, sugars, and sugary drinks.  Choose heart-healthy fats.  Limit cheese to 1 oz (28 g) per day.  Eat more home-cooked food and less restaurant, buffet, and fast food.  Limit fried foods.  Cook foods using methods other than frying.  Limit canned vegetables. If you do use them, rinse them well to decrease the sodium.  When eating at a restaurant, ask that your food be prepared with less salt, or no salt if possible. WHAT FOODS CAN I EAT? Seek  help from a dietitian for individual calorie needs. Grains Whole grain or whole wheat bread. Brown rice. Whole grain or whole wheat pasta. Quinoa, bulgur, and whole grain cereals. Low-sodium cereals. Corn or whole wheat flour tortillas. Whole grain cornbread. Whole grain crackers. Low-sodium crackers. Vegetables Fresh or frozen vegetables (raw, steamed, roasted, or grilled). Low-sodium or reduced-sodium tomato and vegetable juices. Low-sodium or reduced-sodium tomato sauce and paste. Low-sodium or reduced-sodium canned vegetables.  Fruits All fresh, canned (in natural juice), or frozen fruits. Meat and Other Protein Products Ground beef (85% or leaner), grass-fed beef, or beef trimmed of fat. Skinless chicken or [place]. Ground chicken or [place]. Pork trimmed of fat. All fish and seafood. Eggs. Dried beans, peas, or lentils. Unsalted nuts and seeds. Unsalted canned beans. Dairy Low-fat dairy products, such as skim or 1% milk, 2% or reduced-fat cheeses, low-fat ricotta or cottage cheese, or plain low-fat yogurt. Low-sodium or reduced-sodium cheeses. Fats and Oils Tub margarines without trans fats. Light or reduced-fat mayonnaise and salad dressings (reduced sodium). Avocado. Safflower, olive, or canola oils. Natural peanut or almond butter. Other Unsalted popcorn and pretzels. The items listed above may not be a complete list of recommended foods or beverages. Contact your dietitian for more options. WHAT FOODS ARE NOT RECOMMENDED? Grains White bread. White pasta. White rice. Refined cornbread. Bagels and croissants. Crackers that contain trans fat. Vegetables Creamed or fried vegetables. Vegetables in a cheese sauce. Regular canned vegetables. Regular canned tomato sauce and paste. Regular tomato and  vegetable juices. Fruits Dried fruits. Canned fruit in light or heavy syrup. Fruit juice. Meat and Other Protein Products Fatty cuts of meat. Ribs, chicken wings, bacon, sausage, bologna, salami,  chitterlings, fatback, hot dogs, bratwurst, and packaged luncheon meats. Salted nuts and seeds. Canned beans with salt. Dairy Whole or 2% milk, cream, half-and-half, and cream cheese. Whole-fat or sweetened yogurt. Full-fat cheeses or blue cheese. Nondairy creamers and whipped toppings. Processed cheese, cheese spreads, or cheese curds. Condiments Onion and garlic salt, seasoned salt, table salt, and sea salt. Canned and packaged gravies. Worcestershire sauce. Tartar sauce. Barbecue sauce. Teriyaki sauce. Soy sauce, including reduced sodium. Steak sauce. Fish sauce. Oyster sauce. Cocktail sauce. Horseradish. Ketchup and mustard. Meat flavorings and tenderizers. Bouillon cubes. Hot sauce. Tabasco sauce. Marinades. Taco seasonings. Relishes. Fats and Oils Butter, stick margarine, lard, shortening, ghee, and bacon fat. Coconut, palm kernel, or palm oils. Regular salad dressings. Other Pickles and olives. Salted popcorn and pretzels. The items listed above may not be a complete list of foods and beverages to avoid. Contact your dietitian for more information. WHERE CAN I FIND MORE INFORMATION? National Heart, Lung, and Blood Institute: travelstabloid.com Document Released: 03/07/2011 Document Revised: 08/02/2013 Document Reviewed: 01/20/2013 [facility] Patient Information 2015 [place], [place]. This information is not intended to replace advice given to you by your health care provider. Make sure you discuss any questions you have with your health care provider.

## 2014-03-21 NOTE — Assessment & Plan Note (Signed)
Reviewed A1c and medications today.  - Switched medication to metformin 500mg  BID. - F/u in January for A1c. - Pt wants to work on diet and exercise. DASH diet printed. - Simvastatin increase to 40 mg daily and will switch to high intensity statin once finishes these 90 tabs.

## 2014-03-21 NOTE — Progress Notes (Signed)
Patient ID: Lori Jones, female   DOB: Jan 08, 1961, 53 y.o.   MRN: [id number] Subjective:   CC: Medication review  HPI:   Patient presents today for medication review. She was seen in nutrition clinic and it was found she was taking metformin 500mg  BID but 1000mg  BID was written. She reports taking 500mg  BID for years and most recent A1c was 7.2. She would like to remain on this dose for now. She does not want to check A1c today.  Reviewed other medications as well. All others are as listed.  Review of Systems - Per HPI.     Objective:  Physical Exam BP 154/93 mmHg  Pulse 86  Temp(Src) 98.8 F (37.1 C) (Oral)  Ht 5\' 8"  (1.727 m)  Wt 195 lb 12.8 oz (88.814 kg)  BMI 29.78 kg/m2 GEN: NAD PSYCH: Mood and affect euthymic SKIN: No rash or cyanosis    Assessment:     Lori Jones is a 53 y.o. female here for medication review.    Plan:     # See problem list and after visit summary for problem-specific plans.   # Health Maintenance: Not discussed   Follow-up: Follow up in 1 month for DM f/u.   [name], MD [place]

## 2014-04-25 ENCOUNTER — Ambulatory Visit: Payer: Medicare Other | Admitting: Family Medicine

## 2014-05-03 ENCOUNTER — Encounter: Payer: Self-pay | Admitting: Family Medicine

## 2014-05-03 ENCOUNTER — Ambulatory Visit (INDEPENDENT_AMBULATORY_CARE_PROVIDER_SITE_OTHER): Payer: Medicare Other | Admitting: Family Medicine

## 2014-05-03 VITALS — Ht 68.0 in | Wt 194.5 lb

## 2014-05-03 DIAGNOSIS — E089 Diabetes mellitus due to underlying condition without complications: Secondary | ICD-10-CM
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
## 2014-05-03 NOTE — Patient Instructions (Addendum)
Diet Recommendations for Diabetes   Starchy (carb) foods include: Bread, rice, pasta, potatoes, corn, crackers, bagels, muffins, all baked goods. (Fruits, milk, and yogurt also have carbohydrate, but most of these foods will not spike your blood sugar as the starchy foods will.) A few fruits do cause high blood sugars; use small portions of bananas (limit to 1/2 at a time), grapes, watermelon, and most tropical fruits.   Protein foods include: Meat, fish, poultry, eggs, dairy foods, and beans such as pinto and kidney beans (beans also provide carbohydrate).   1. Eat at least 3 meals and 1-2 snacks per day. Never go more than 4-5 hours while awake without eating. Eat breakfast within the first hour of getting up.  2. Limit starchy foods to TWO per meal and ONE per snack. ONE portion of a starchy food is equal to the following:  - ONE slice of bread (or its equivalent, such as half of a hamburger bun).  - 1/2 cup of a "scoopable" starchy food such as potatoes or rice.  - 15 grams of carbohydrate as shown on food label.  3. Include at every lunch and dinner: a protein food, a carb food, and vegetables.  - Obtain twice as many veg's as protein or carbohydrate foods for both lunch and dinner.  - Fresh or frozen veg's are best.  - Try to keep frozen veg's on hand for a quick vegetable serving.  - What did you learn from yesterday's [employer] meal?  - It wasn't good for me.    - It didn't taste great.   - Too many carb's at one time. (Restaurants often offer a lot of carb choices b/c these tend to be the cheap foods.)  - Felt dissatisfied when finished the meal.    - What else did you learn about your appetite yesterday?  - I need to eat more frequently during the day, i.e., eat every 4-5 hours.    Lori Jones clear of buffets/all-you-can-eat.   - I need to allow enough time for a good breakfast.   - I need to  meet my own needs before taking care of someone else.

## 2014-05-05 ENCOUNTER — Other Ambulatory Visit: Payer: Self-pay | Admitting: Family Medicine

## 2014-05-23 DIAGNOSIS — N760 Acute vaginitis: Secondary | ICD-10-CM | POA: Diagnosis not present

## 2014-05-25 DIAGNOSIS — E119 Type 2 diabetes mellitus without complications: Secondary | ICD-10-CM | POA: Diagnosis not present

## 2014-05-25 DIAGNOSIS — H4011X1 Primary open-angle glaucoma, mild stage: Secondary | ICD-10-CM | POA: Diagnosis not present

## 2014-05-25 DIAGNOSIS — H25813 Combined forms of age-related cataract, bilateral: Secondary | ICD-10-CM | POA: Diagnosis not present

## 2014-06-06 ENCOUNTER — Ambulatory Visit (INDEPENDENT_AMBULATORY_CARE_PROVIDER_SITE_OTHER): Payer: Medicare Other | Admitting: Family Medicine

## 2014-06-06 ENCOUNTER — Encounter: Payer: Self-pay | Admitting: Family Medicine

## 2014-06-06 VITALS — Ht 68.0 in | Wt 198.1 lb

## 2014-06-06 DIAGNOSIS — E089 Diabetes mellitus due to underlying condition without complications: Secondary | ICD-10-CM | POA: Diagnosis not present

## 2014-06-06 DIAGNOSIS — E669 Obesity, unspecified: Secondary | ICD-10-CM

## 2014-06-06 NOTE — Patient Instructions (Signed)
Diet Recommendations for Diabetes   Starchy (carb) foods include: Bread, rice, pasta, potatoes, corn, crackers, bagels, muffins, all baked goods.  (Fruits, milk, and yogurt also have carbohydrate, but most of these foods will not spike your blood sugar as the starchy foods will.)  A few fruits do cause high blood sugars; use small portions of bananas (limit to 1/2 at a time), grapes, watermelon, and most tropical fruits.    Protein foods include: Meat, fish, poultry, eggs, dairy foods, and beans such as pinto and kidney beans (beans also provide carbohydrate).   1. Eat at least 3 meals and 1-2 snacks per day. Never go more than 4-5 hours while awake without eating. Eat breakfast within the first hour of getting up.   2. Limit starchy foods to TWO per meal and ONE per snack. ONE portion of a starchy  food is equal to the following:   - ONE slice of bread (or its equivalent, such as half of a hamburger bun).   - 1/2 cup of a "scoopable" starchy food such as potatoes or rice.   - 15 grams of carbohydrate as shown on food label.  3. Include at every meal: a protein food, a carb food, and vegetables and/or fruit.   - Obtain twice as many veg's as protein or carbohydrate foods for both lunch and dinner.   - Fresh or frozen veg's are best.   - Try to keep frozen veg's on hand for a quick vegetable serving.     - PLEASE PAY CLOSER ATTENTION TO LIMITING CARB'S AT MEALS, AND TO GETTING VEG'S 2 X DAY.

## 2014-06-06 NOTE — Progress Notes (Signed)
Medical Nutrition Therapy:  Appt start time: 1000 end time:  1100.  Assessment:  Primary concerns today: Weight management and Blood sugar control. (BMI >30, H70.2; DM w/out complica, O37.8)  [place] has been walking ~30 minutes 5 X wk for the past couple of weeks.  She said she is feeling much better since starting the walking, and her BG have been in better control.   FBG today:  148; most have been under 160, although before starting she had some around 200.  Although Lamyiah feels her clothes are fitting better, her weight has not gone down, and yesterday's intake suggests she is not carefully limiting dietary carb.  We reviewed her recommendations again, and emphasized the importance of limiting carb's at each meal/snack.  Sh is hoping to join the [employer] to be able to do water exercise in addn to her walking.    24-hr recall suggests intake of ~1315 kcal:  (Up at 7 AM; to church at 8; had water & med's before church)    Kcal Carb, g       Carb portions B (9:30 AM)-  2 1/2 pancakes, 1 scrmld egg, 1 patty [place] sausage, 1 c choc 1% milk 410 56(+12 from milk) 4 Snk ( AM)-   L (2 PM)-  1 hot dog, 1 tbsp chili, must, relish, 2 slc bread, water   400 30(+)   2 Snk (5 PM)-  1 small bag chips, water       160 15   1 D ( PM)-  2 c Sugar Smacks & Froot Loops, 1 c 1% milk    345 58   4 Snk ( PM)-   Typical day? No. Usually has a real meal for dinner; usually gets veg's once a day.     Progress Towards Goal(s):  In progress.   Nutritional Diagnosis:  NI-5.8.2 Excessive carbohydrate intake As related to pancakes, syrup, cereal.  As evidenced by yesterday's intake of >60 g of carb at 2 of 3 meals.    Intervention:  Nutrition education.  Handouts given during visit include:  AVS  Demonstrated degree of understanding via:  Teach Back   Monitoring/Evaluation:  Dietary intake, exercise, BG, and body weight in 5 week(s).

## 2014-06-07 ENCOUNTER — Ambulatory Visit (INDEPENDENT_AMBULATORY_CARE_PROVIDER_SITE_OTHER): Payer: Medicare Other | Admitting: Family Medicine

## 2014-06-07 ENCOUNTER — Encounter: Payer: Self-pay | Admitting: Family Medicine

## 2014-06-07 VITALS — BP 131/89 | HR 75 | Temp 98.3°F | Ht 68.0 in | Wt 198.0 lb

## 2014-06-07 DIAGNOSIS — E089 Diabetes mellitus due to underlying condition without complications: Secondary | ICD-10-CM | POA: Diagnosis present

## 2014-06-07 DIAGNOSIS — F259 Schizoaffective disorder, unspecified: Secondary | ICD-10-CM

## 2014-06-07 DIAGNOSIS — E785 Hyperlipidemia, unspecified: Secondary | ICD-10-CM | POA: Diagnosis not present

## 2014-06-07 LAB — POCT GLYCOSYLATED HEMOGLOBIN (HGB A1C): Hemoglobin A1C: 8

## 2014-06-07 MED ORDER — METFORMIN HCL 1000 MG PO TABS: 1000.0000 mg | ORAL_TABLET | Freq: Two times a day (BID) | ORAL | Status: DC

## 2014-06-07 MED ORDER — ATORVASTATIN CALCIUM 40 MG PO TABS: 40.0000 mg | ORAL_TABLET | Freq: Every day | ORAL | Status: DC

## 2014-06-07 NOTE — Assessment & Plan Note (Signed)
Discussed importance of taking medication daily. Patient is amenable. - Increase Lexapro to daily. - At follow up, if it seems paranoias are continuing to worsen, we'll rediscuss Latuda which patient had declined in the past.

## 2014-06-07 NOTE — Progress Notes (Signed)
Patient ID: Lori Jones, female   DOB: 28-Aug-1960, 54 y.o.   MRN: [id number] Subjective:   CC: Follow-up diabetes  HPI:   Follow-up diabetes A1c 8 today up from 7.2.  Meds: Take metformin 500mg  BID, tradjenta 5mg  daily. Has not missed any doses. Dec and Jan were stressful months more than normal so blood sugars went up to 200s. Has not had any lows under 60 Symptoms: No symptoms of hyperglycemia (blurred vision, polyuria or polydypsia); no symptoms of hypoglycemia (anxious, diaphoresis) and no blood sugars <60. Occ mild chest pain when she gets upset. Saw Dr Lori Jones 08/2013, stress test per pt normal (cannot find record).  BP well controlled Heart - lipids checked 07/2013, aspirin 81mg , simvastatin. Would benefit from incr to high intensity and is amenable. Kidneys - Cr 0.83 11/2013 Eyes - checked 1 week ago. No issues per patient. F/u 1 year. Exercise: plans to now that weather better. Has been wakling 4x/week. Plans on Walking 5-6d/week. 8min/walk. Diet: Saw sykes yesterday. For stress level, Started walking, riding bike, today blood sugar 138. Left church that was making her stressed. Stress-free now.  Hyperlipidemia Patient is amenable to switching to high intensity statin. She still has some simvastatin and will continue this until it runs out.  Schizoaffective disorder A few months ago, patient reports feeling increasingly paranoid that people at church were talking about her. She has been taking Lexapro but only 3 days per week. She is not taking daily, because she feels like leaving the church and getting away from stressor has improved symptoms enough. She does endorse having some paranoias that people were following her at that time.  Review of Systems - Per HPI.  PMH - Schizoaffective disorder, DM, heart murmur, hypothyroidism, HLD, abdominal pain     Objective:  Physical Exam BP 131/89 mmHg  Pulse 75  Temp(Src) 98.3 F (36.8 C) (Oral)  Ht 5\' 8"  (1.727 m)  Wt 198 lb  (89.812 kg)  BMI 30.11 kg/m2 GEN: NAD CV: RRR, no m/r/g, no JVD EXTR: No LE edema or calf tenderness PULM: CTAB, normal effort PSYCH: Mood and affect euthymic, prior paranoias about being followed and church members talking about her, none now.  Assessment:     Lori Jones is a 54 y.o. female here for f/u DM, HLD, schizoaffective d/o.    Plan:     # See problem list and after visit summary for problem-specific plans.   # Health Maintenance: Not discussed  Follow-up: Follow up in 3 months for DM and schizoaffective d/o f/u.   Lori Sinclair, MD [facility]

## 2014-06-07 NOTE — Assessment & Plan Note (Signed)
Patient would benefit from high-intensity statin. -Switch from simvastatin to atorvastatin 40 mg daily once simvastatin runs out.

## 2014-06-07 NOTE — Patient Instructions (Signed)
Increase metformin to 1000mg  twice daily. Call me if you have any side effects with this. Finish your simvastatin 40 mg nightly. When done with this, start atorvastatin 40mg  nightly.  Take lexapro daily. Let me know if feeling paranoias worse than usual so we can increase medication.  Continue working ons tress relief.  Follow up in 3 months (early June).  Best,  [name], MD

## 2014-06-07 NOTE — Assessment & Plan Note (Signed)
A1c worsened from 7.2-8. Some contribution from increased stress level. - Work on stress relief. Increasing exercises a great idea. -Recently met with Dr. Jenne Campus for dietary guidance. - We'll go ahead and increase from simvastatin to atorvastatin $RemoveBeforeD'40mg'JvMemxKNfIEDZE$  daily once patient runs out of her simvastatin. -increase metformin to $RemoveBefo'1000mg'mBliCdNcxaD$  BID (1000am and 500pm until run out of current tabs, then 1000bid). - Follow-up A1c in 3 months. Diabetic foot exam at that time. - Had been seen in June 2015 for occasional mild chest pain with a reportedly normal stress test. If chest pain worsenes, we will have her see dr Warren Lacy again.

## 2014-06-21 ENCOUNTER — Other Ambulatory Visit: Payer: Self-pay | Admitting: Family Medicine

## 2014-06-29 ENCOUNTER — Encounter: Payer: Self-pay | Admitting: Family Medicine

## 2014-07-12 ENCOUNTER — Ambulatory Visit (INDEPENDENT_AMBULATORY_CARE_PROVIDER_SITE_OTHER): Payer: Medicare Other | Admitting: Family Medicine

## 2014-07-12 ENCOUNTER — Encounter: Payer: Self-pay | Admitting: Family Medicine

## 2014-07-12 VITALS — Ht 68.0 in | Wt 195.9 lb

## 2014-07-12 DIAGNOSIS — E669 Obesity, unspecified: Secondary | ICD-10-CM

## 2014-07-12 DIAGNOSIS — E089 Diabetes mellitus due to underlying condition without complications: Secondary | ICD-10-CM

## 2014-07-12 NOTE — Patient Instructions (Signed)
Diet Recommendations for Diabetes   Starchy (carb) foods include: Bread, rice, pasta, potatoes, corn, crackers, bagels, muffins, all baked goods. (Fruits, milk, and yogurt also have carbohydrate, but most of these foods will not spike your blood sugar as the starchy foods will.) A few fruits do cause high blood sugars; use small portions of bananas (limit to 1/2 at a time), grapes, watermelon, and most tropical fruits.   Protein foods include: Meat, fish, poultry, eggs, dairy foods, and beans such as pinto and kidney beans (beans also provide carbohydrate).   1. Eat at least 3 meals and 1-2 snacks per day. Never go more than 4-5 hours while awake without eating. Eat breakfast within the first hour of getting up.  2. Limit starchy foods to TWO per meal and ONE per snack. ONE portion of a starchy food is equal to the following:  - ONE slice of bread (or its equivalent, such as half of a hamburger bun).  - 1/2 cup of a "scoopable" starchy food such as potatoes or rice.  - 15 grams of carbohydrate as shown on food label.  3. Include at every meal: a protein food, a carb food, and vegetables and/or fruit.  - Obtain twice as many veg's as protein or carbohydrate foods for both lunch and dinner.  - Fresh or frozen veg's are best.  - Try to keep frozen veg's on hand for a quick vegetable serving.   - FOR THE NEXT WEEK OR TWO, MEASURE YOUR CARB FOODS SO YOU KNOW EXACTLY WHAT ONE PORTION IS.   - ALSO, READ THE FOOD LABELS ON CARB YOU USE, SO YOU KNOW WHAT IS ONE PORTION.

## 2014-07-12 NOTE — Progress Notes (Signed)
Medical Nutrition Therapy:  Appt start time: 1000 end time:  1100.  Assessment:  Primary concerns today: Weight management and Blood sugar control. (BMI >30, T36.4; DM w/out complica, W80.3)  Lori Jones took herself off her Tradjenta last Thursday, thinking that her abdominal pain was related to that.  She plans to make an appt with Lori Jones to discuss.    She has still been walking (with a partner now) 40-50 min 3-4 X wk.  It is clear, however, from yesterday's intake that she is not limiting or tracking carb intake.  We reviewed al the sources of CHO in her foods yesterday, and she was very surprised to see that she'd consumed more than 6 carb portions at dinner.    FBG have been running 109-119, with occasional higher ones, up to 150 one time.  She are checking daily.    24-hr recall suggests intake of >13 carb portions:  (Up at 9 AM)            Carb portions B (10 AM)-  1 pkt flvrd inst oatmeal  2  1 slc toast    1 1/2 tsp jelly    -- 1 boiled egg     Snk (12 PM)-  1 small bag Cheetos   1 L (1 PM)-  1 pb&j sandwich   2 Snk ( PM)-  --- D ( PM)-  1 c baked beans (48 g)  3 1 c brn rice    2 4 oz chx 1 roll     1 1 1/2 tbsp candied swt pot's  <1 Snk ( PM)-  4 Lorna Doone cookies  1  Progress Towards Goal(s):  In progress.   Nutritional Diagnosis:  NI-5.8.2 Excessive carbohydrate intake As related to meals.  As evidenced by yesterday's intake of >100 g of carb at dinner.    Intervention:  Nutrition education.  Handouts given during visit include:  AVS   Copy of yesterday's intake along with breakdown of carb portions  Demonstrated degree of understanding via:  Teach Back   Monitoring/Evaluation:  Dietary intake, exercise, BG, and body weight in 6 week(s).  No appts available sooner.

## 2014-07-13 ENCOUNTER — Ambulatory Visit (INDEPENDENT_AMBULATORY_CARE_PROVIDER_SITE_OTHER): Payer: Medicare Other | Admitting: Family Medicine

## 2014-07-13 ENCOUNTER — Encounter: Payer: Self-pay | Admitting: Family Medicine

## 2014-07-13 VITALS — BP 124/78 | HR 75 | Temp 98.7°F | Ht 68.0 in | Wt 196.6 lb

## 2014-07-13 DIAGNOSIS — R0789 Other chest pain: Secondary | ICD-10-CM | POA: Diagnosis not present

## 2014-07-13 NOTE — Patient Instructions (Signed)
Thank you for coming in, today!  Everything sounds okay, today. I think your pain is probably due to the muscles in your chest wall. I would recommend low-dose ibuprofen, for now -- 200 or 400 mg, two or three times per day. If you have any questions or concerns, call us or come back right away. If things get worse or if you have sweating, lightheadedness, worse pain / pressure in your chest, and so on, go to the emergency room, right away.  Come back to see Dr. Dianah Field in a couple of weeks to talk about your diabetes medicines. Please feel free to call with any questions or concerns at any time, at [phone]. --Dr. Venetia Maxon

## 2014-07-13 NOTE — Progress Notes (Signed)
   Subjective:    Patient ID: Lori Jones, female    DOB: Feb 02, 1961, 54 y.o.   MRN: [id number]  HPI: Pt presents to clinic for SDA for chest pains for about 5 days. Pain is described as a 3/10 currently and when it started, but 5/10 last night. Pain is described as a soreness and a "pulling" sensation at times, worse with movement, on her left side. She has not had sweating or lightheadedness, headaches, or LE swelling. Rest does make her feel some better. She has not taken anything for pain, and reports compliance with her regular medications; she did stop her Trajenda last week due to abdominal discomfort with it. She denies heavy lifting or new exertion. She has no sick contacts. She denies fevers / chills, nausea / vomiting, abdominal pain, currently.  Of note, pt has had similar pain in the past and states it happens "when she gets worried," but this is not as bad and she does not have any specific "worries" right now. Her mother's birthday was on 09-Aug-2023 (she passed away in 2006-07-30), and she feels like "maybe subconsciously" this might have bothered her.  Review of Systems: As above.     Objective:   Physical Exam BP 124/78 mmHg  Pulse 75  Temp(Src) 98.7 F (37.1 C) (Oral)  Ht 5\' 8"  (1.727 m)  Wt 196 lb 9.6 oz (89.177 kg)  BMI 29.90 kg/m2 Gen: adult female in NAD, very pleasant HEENT: [place]/AT, EOMI, PERRLA, MMM, TM's clear bilaterally  Nasal mucosae and posterior oropharynx clear Neck: supple, normal ROM, no lymphadenopathy Cardio: RRR, no murmur appreciated Chest wall: reproducibly tender to deep parasternal palpation, especially in the left Pulm: CTAB, no wheezes, normal WOB Abd: soft, nontender, BS+ Ext: warm, well-perfused, no LE edema     Assessment & Plan:  53yo female with history / exam consistent with musculoskeletal chest pain - defer imaging, labs, or EKG given duration and description of pain with no true cardiac-type symptoms and frank reproducibility on exam -  recommended low-dose ibuprofen, 200-400 mg BID or TID for 1-2 weeks, then PRN - recommended rest, gentle arm stretches, etc, as needed - counseled on frank red flags that would prompt immediate return to clinic or presentation to the ED (diaphoresis, frank SOB, chest pressure, LE swelling, etc) - f/u with PCP Dr. Jule Economy as needed, otherwise  Note FYI to Dr. Darene Lamer; strongly recommended close f/u for further discussion of DM medications  Emmaline Kluver, MD PGY-3, [facility] 07/13/2014, 1:37 PM

## 2014-07-26 ENCOUNTER — Encounter: Payer: Self-pay | Admitting: Family Medicine

## 2014-07-26 ENCOUNTER — Ambulatory Visit (INDEPENDENT_AMBULATORY_CARE_PROVIDER_SITE_OTHER): Payer: Medicare Other | Admitting: Family Medicine

## 2014-07-26 VITALS — BP 124/79 | HR 86 | Temp 98.2°F | Ht 68.0 in | Wt 194.8 lb

## 2014-07-26 DIAGNOSIS — R1010 Upper abdominal pain, unspecified: Secondary | ICD-10-CM

## 2014-07-26 DIAGNOSIS — R0789 Other chest pain: Secondary | ICD-10-CM | POA: Diagnosis not present

## 2014-07-26 MED ORDER — OMEPRAZOLE 40 MG PO CPDR: 40.0000 mg | DELAYED_RELEASE_CAPSULE | Freq: Every day | ORAL | Status: DC

## 2014-07-26 NOTE — Assessment & Plan Note (Signed)
Likely MSK, resolved.

## 2014-07-26 NOTE — Assessment & Plan Note (Signed)
Intermittent epigastric abdominal pain possibly related to reported h/o cholecystitis. Abdomen nonacute, VSS. Frustrated and does not want to keep needing pepto bismol. Prior to starting metfomin. Also consider gastroparesis. - Referral to GI placed due to persistent symptoms. Consider EGD. - In the meantime, continue to avoid fatty foods and prescribed PPI.

## 2014-07-26 NOTE — Progress Notes (Signed)
Patient ID: Lori Jones, female   DOB: 1961/02/10, 54 y.o.   MRN: [id number] Subjective:   CC: Follow-up  HPI:   Patient was seen in clinic 07/13/2014 for chest pain that was thought to be musculoskeletal. Treated with low-dose ibuprofen twice a day for 1-2 weeks. States that this pain is resolved. She thinks it was due to lifting something very heavy.  Patient is also here for persistent crampy abdominal pain she has had for about 2 years. She states that it is intermittent, last week progressed to every night. It is an upper abdomen and is 9/10. Seen for this Sept, persistent, worse with fatty foods, h/o gallstones in the past. CMET, CBC, and lipase were normal in Sept. She also mentions that EGD done years ago. Pain does not feel like reflux that she has had in the past. She denies vaginal discharge, dysuria, or pelvic pain. She has had some diarrhea that resolved 3 days ago. She denies nausea or vomiting, burning epigastric pain, burping, bad taste, fevers, chills, medication change, or relationship to food. She has not had any obvious weight change or night sweats.    Review of Systems - Per HPI.   PMH - diabetes, heart murmur, HLD, schizoaffective disorder, hypothyroidism    Objective:  Physical Exam BP 124/79 mmHg  Pulse 86  Temp(Src) 98.2 F (36.8 C) (Oral)  Ht 5\' 8"  (1.727 m)  Wt 194 lb 12.8 oz (88.361 kg)  BMI 29.63 kg/m2 GEN: NAD CV: RRR, no m/r/g PULM: CTAB, normal effort ABD: S/NT/ND, NABS    Assessment:     Lori Jones is a 54 y.o. female here for follow-up of abdominal pain.    Plan:     # See problem list and after visit summary for problem-specific plans. - Follow-up in June for schizoaffective disorder and diabetes follow-up  Follow-up: Follow up when necessary.   [name], MD [place]

## 2014-07-27 NOTE — Progress Notes (Signed)
I was the preceptor on the day of this visit.   Avamarie Crossley MD  

## 2014-08-04 DIAGNOSIS — R1033 Periumbilical pain: Secondary | ICD-10-CM | POA: Diagnosis not present

## 2014-08-04 DIAGNOSIS — K8080 Other cholelithiasis without obstruction: Secondary | ICD-10-CM | POA: Diagnosis not present

## 2014-08-04 DIAGNOSIS — K435 Parastomal hernia without obstruction or  gangrene: Secondary | ICD-10-CM | POA: Diagnosis not present

## 2014-08-04 DIAGNOSIS — K436 Other and unspecified ventral hernia with obstruction, without gangrene: Secondary | ICD-10-CM | POA: Diagnosis not present

## 2014-08-04 DIAGNOSIS — K219 Gastro-esophageal reflux disease without esophagitis: Secondary | ICD-10-CM | POA: Diagnosis not present

## 2014-08-05 ENCOUNTER — Other Ambulatory Visit: Payer: Self-pay | Admitting: Gastroenterology

## 2014-08-05 DIAGNOSIS — R1033 Periumbilical pain: Secondary | ICD-10-CM

## 2014-08-08 ENCOUNTER — Other Ambulatory Visit: Payer: Self-pay | Admitting: Family Medicine

## 2014-08-10 ENCOUNTER — Encounter (HOSPITAL_COMMUNITY): Payer: Self-pay

## 2014-08-10 ENCOUNTER — Ambulatory Visit (HOSPITAL_COMMUNITY)
Admission: RE | Admit: 2014-08-10 | Discharge: 2014-08-10 | Disposition: A | Discharge status: Home / Self Care | Payer: Medicare Other | Source: Ambulatory Visit | Attending: Gastroenterology | Admitting: Gastroenterology

## 2014-08-10 DIAGNOSIS — R110 Nausea: Secondary | ICD-10-CM | POA: Diagnosis not present

## 2014-08-10 DIAGNOSIS — R197 Diarrhea, unspecified: Secondary | ICD-10-CM | POA: Insufficient documentation

## 2014-08-10 DIAGNOSIS — K439 Ventral hernia without obstruction or gangrene: Secondary | ICD-10-CM | POA: Diagnosis not present

## 2014-08-10 DIAGNOSIS — R1033 Periumbilical pain: Secondary | ICD-10-CM | POA: Insufficient documentation

## 2014-08-10 MED ORDER — IOHEXOL 300 MG/ML  SOLN
100.0000 mL | Freq: Once | INTRAMUSCULAR | Status: AC | PRN
  Administered 2014-08-10: 100 mL via INTRAVENOUS

## 2014-08-10 NOTE — Telephone Encounter (Signed)
Patient has an appt on 09-05-14. Jazmin Hartsell,CMA

## 2014-08-10 NOTE — Telephone Encounter (Signed)
Please notify patient that she is overdue for thyroid follow up to make sure she is on the right dose. Thank you,  [name], MD

## 2014-08-14 ENCOUNTER — Encounter: Payer: Self-pay | Admitting: Family Medicine

## 2014-08-14 DIAGNOSIS — K439 Ventral hernia without obstruction or gangrene: Secondary | ICD-10-CM | POA: Insufficient documentation

## 2014-08-22 ENCOUNTER — Ambulatory Visit (INDEPENDENT_AMBULATORY_CARE_PROVIDER_SITE_OTHER): Payer: Medicare Other | Admitting: Family Medicine

## 2014-08-22 ENCOUNTER — Encounter: Payer: Self-pay | Admitting: Family Medicine

## 2014-08-22 VITALS — Ht 68.0 in | Wt 189.8 lb

## 2014-08-22 DIAGNOSIS — E089 Diabetes mellitus due to underlying condition without complications: Secondary | ICD-10-CM

## 2014-08-22 DIAGNOSIS — E669 Obesity, unspecified: Secondary | ICD-10-CM | POA: Diagnosis not present

## 2014-08-22 NOTE — Patient Instructions (Signed)
-   Your blood glucose control is great!  Keep up the careful choices.  Most importantly:  ANY time you have a starchy food, check out the label to know how much of that food = 1 starch portion.  Limit your starches to two per meal and one per snack.   - Keep up the great job walking, and get back to it as you're able following surgery!  - We can follow-up with a nutrition appt after your surgery.  Call for appt at your convenience:  [phone].

## 2014-08-22 NOTE — Progress Notes (Signed)
Medical Nutrition Therapy:  Appt start time: 1000 end time:  1100.  Assessment:  Primary concerns today: Weight management and Blood sugar control. (BMI >30, L85.9; DM w/out complica, M93.1)  Lori Jones will be having a cholecystectomy and hernia repair; not sure of date yet.  She meets with her surgeon on Friday.   FBG have been running low-100s to 110.  She has been avoiding a lot of foods b/c of her gall bladder problems.  FBG this AM was 118 (b'day cake and late dinner last night?). Lori Jones has been walking 2.4-3 miles (~1 hr) 3-4 X wk and additional 40 min 1-3 X wk.    24-hr recall:  (Up at 7 AM) Went to 7:45 AM church service B (9:30 AM)-  1 scrmbld egg, 1/2 c grits, 1/4 tsp butter, 1 sausage biscuit, 1/2 tsp jelly, water Snk ( AM)-  --- L (2:30 PM)-  1 oz [place], 1 oz Am cheese, 10 Ritz crackers, water Snk (6 PM)-  small pc cake (b'day party) Went to 4 PM church service D (7:30 PM)-  4 oz  breaded chx brst (no skin eaten), 3/4 c baked beans, 1 c mixed veg's, 2 tbsp mac&chs Snk ( PM)-  --- Typical day? No. Usually has bread with lunch; dinner was a birthday party.    Progress Towards Goal(s):  In progress.   Nutritional Diagnosis:  Progress noted on NI-5.8.2 Excessive carbohydrate intake As related to meals.  As evidenced by yesterday's intake of carb intake within recommended limits except for dinner of ~60 g (and cake).    Intervention:  Nutrition education.  Handouts given during visit include:  AVS   Demonstrated degree of understanding via:  Teach Back   Monitoring/Evaluation:  Dietary intake, exercise, BG, and body weight prn.  Marland Kitchen

## 2014-08-26 ENCOUNTER — Other Ambulatory Visit: Payer: Self-pay | Admitting: Surgery

## 2014-08-26 DIAGNOSIS — K8020 Calculus of gallbladder without cholecystitis without obstruction: Secondary | ICD-10-CM | POA: Diagnosis not present

## 2014-08-26 DIAGNOSIS — K439 Ventral hernia without obstruction or gangrene: Secondary | ICD-10-CM | POA: Diagnosis not present

## 2014-08-30 ENCOUNTER — Encounter: Payer: Self-pay | Admitting: Family Medicine

## 2014-08-30 ENCOUNTER — Ambulatory Visit (INDEPENDENT_AMBULATORY_CARE_PROVIDER_SITE_OTHER): Payer: Medicare Other | Admitting: Family Medicine

## 2014-08-30 VITALS — BP 140/84 | HR 75 | Temp 98.1°F | Ht 68.0 in | Wt 191.0 lb

## 2014-08-30 DIAGNOSIS — F259 Schizoaffective disorder, unspecified: Secondary | ICD-10-CM | POA: Diagnosis not present

## 2014-08-30 DIAGNOSIS — E089 Diabetes mellitus due to underlying condition without complications: Secondary | ICD-10-CM | POA: Diagnosis not present

## 2014-08-30 DIAGNOSIS — I10 Essential (primary) hypertension: Secondary | ICD-10-CM

## 2014-08-30 DIAGNOSIS — R221 Localized swelling, mass and lump, neck: Secondary | ICD-10-CM

## 2014-08-30 DIAGNOSIS — K432 Incisional hernia without obstruction or gangrene: Secondary | ICD-10-CM

## 2014-08-30 DIAGNOSIS — R109 Unspecified abdominal pain: Secondary | ICD-10-CM

## 2014-08-30 LAB — POCT GLYCOSYLATED HEMOGLOBIN (HGB A1C): Hemoglobin A1C: 7

## 2014-08-30 MED ORDER — LISINOPRIL 5 MG PO TABS: 5.0000 mg | ORAL_TABLET | Freq: Every day | ORAL | Status: DC

## 2014-08-30 MED ORDER — LISINOPRIL 2.5 MG PO TABS: 2.5000 mg | ORAL_TABLET | Freq: Every day | ORAL | Status: DC

## 2014-08-30 NOTE — Patient Instructions (Addendum)
  Continue taking medications daily. I encourage you to start Lexapro at a half tablet every day. Let me know which medications need refills. Start taking lisinopril 1 tablet every day for blood pressure control and kidney protection due to diabetes. Please come back for a lab visit in 2-4 weeks to recheck kidney function and lipids. Follow-up in 3 months for next diabetes checkup and to meet your new doctor. Follow-up as needed.  [name], MD   Diet Recommendations for Diabetes   Starchy (carb) foods include: Bread, rice, pasta, potatoes, corn, crackers, bagels, muffins, all baked goods.  (Fruits, milk, and yogurt also have carbohydrate, but most of these foods will not spike your blood sugar as the starchy foods will.)  A few fruits do cause high blood sugars; use small portions of bananas (limit to 1/2 at a time), grapes, and most tropical fruits.    Protein foods include: Meat, fish, poultry, eggs, dairy foods, and beans such as pinto and kidney beans (beans also provide carbohydrate).   1. Eat at least 3 meals and 1-2 snacks per day. Never go more than 4-5 hours while awake without eating.  2. Limit starchy foods to TWO per meal and ONE per snack. ONE portion of a starchy  food is equal to the following:   - ONE slice of bread (or its equivalent, such as half of a hamburger bun).   - 1/2 cup of a "scoopable" starchy food such as potatoes or rice.   - 15 grams of carbohydrate as shown on food label.  3. Both lunch and dinner should include a protein food, a carb food, and vegetables.   - Obtain twice as many veg's as protein or carbohydrate foods for both lunch and dinner.   - Fresh or frozen veg's are best.   - Try to keep frozen veg's on hand for a quick vegetable serving.    4. Breakfast should always include protein.

## 2014-08-30 NOTE — Progress Notes (Addendum)
Patient ID: Lori Jones, female   DOB: 10/01/1960, 53 y.o.   MRN: [id number] Subjective:   CC: Follow up  HPI:   Schizoaffective disorder Went to a support session at church, and has been exercising and eating well, which she states makes her mentally feel good. Again refers to pitbull attack a few years ago as cause of all her symptoms. Takes lexapro 1-2 days / week because feels good. Denies AH/VH. Denies SI/HI or depressive symptoms.  F/u Diabetes Mellitus Medications: metformin, tradjenta. Denies side effects. Blood sugar control: 86-135 fasting. Sx of hypoglycemia: no anxiety or diaphoresis. Sx of hyperglycemia: no polyuria, polydypsia, headaches, or blurred vision. End-organ symptoms: No CP/dyspnea. Voiding well.  Heart: Is pt taking aspirin, statin, ACE/ARB: yes to aspirin and atorvastatin. No ACE/ARB. Hesitant to start another medication. Lipids last checked 07/2013.  Kidneys: Creatinine last checked Sep 2015. Feet: Due for check Eyes: Diabetic eye exam 2 mo ago Dr Groat. Per patient, normal  Chronic abdominal pain Referral last visit made to GI to consider EGD, GI saw patient and performed CT scan instead. Consistent with 3 small hernias. Patient has also met with surgery who plans to repair her hernia and cholecystectomy. She has been avoiding greasy foods and feels much better. She again alludes to EGD 7 years ago that showed gallstones.  Review of Systems - Per HPI. Also, pt states small nodules anterior neck have been there since she can remember and have never grown/changed.  PMH - ventral hernia, hypothyroidism, schizoaffective disorder, left wrist pain, hyperlipidemia, heart murmur, diabetes, chronic abdominal pain    Objective:  Physical Exam BP 140/84 mmHg  Pulse 75  Temp(Src) 98.1 F (36.7 C) (Oral)  Ht 5' 8" (1.727 m)  Wt 191 lb (86.637 kg)  BMI 29.05 kg/m2 GEN: NAD Cardiovascular: Regular rate and rhythm, 2/6 systolic murmur, 2+ bilateral radial and dorsalis  pedis pulses Pulmonary: Clear to auscultation bilaterally, normal effort Abdomen: Soft, nontender, nondistended Extremities: No lower extremity edema or calf tenderness Diabetic foot exam with no deformity, erythema, or swelling. 2+ bilateral dorsalis pedis pulses. Monofilament testing normal except decreased sensation left heel and right heel and lateral plantar foot Psych: No depressive symptoms/SI/HI/AH or VH Neuro: Awake, alert, no focal deficits, normal speech and gait HEENT: AT/[place], sclera clear, EOMI, no thyromegaly; superficial 3mm nodule left side of thyroid and 1-2mm nodule right inferior side of thyroid, nontender with no erythema.  Assessment:     Lori Jones is a 53 y.o. female here for f/u diabetes and schizoaffective d/o.    Plan:     # See problem list and after visit summary for problem-specific plans. -Patient to check if she needs refills on anything and let me know through pharmacy.  # Health Maintenance: Not discussed  Follow-up: Follow up in 1-2 mo for f/u schizoaffective d/o and 3 mo for f/u DM.    T , MD [facility]   

## 2014-08-30 NOTE — Assessment & Plan Note (Signed)
Evaluated by GI and surgery, thought due to history of gallstones. No fevers, chills, or severe symptoms. Symptoms have greatly resolved with decrease in fried foods -Surgery planning cholecystectomy by [facility]

## 2014-08-30 NOTE — Assessment & Plan Note (Signed)
Well-controlled at this time per patient, with minimal symptoms and no AH or VH. However, again discussed importance of daily medication.   -Urged complaince. Take 1/2 tab daily if this will make patient more likely to take daily. -Continue eating right, exercising, and finding supports to help her stay positive -F/u every 1-2 months; if paranoias are worsening, rediscuss Latuda which patient has declined in the past

## 2014-08-30 NOTE — Assessment & Plan Note (Signed)
Seen on CT scan 07/2014. Patient would like to get this repaired electively with no signs/symptoms of incarceration. Has met with surgery. -Planning for surgery with [facility]

## 2014-08-30 NOTE — Assessment & Plan Note (Signed)
Bilateral tiny masses<51mm on each side of anterior neck in area of thyroid. No change since birth per pt. No warmth/tenderness one xam.  -Consider thyroid US in future, especially if any change/growth. -Monitor for now. Pt voiced understanding.

## 2014-08-30 NOTE — Assessment & Plan Note (Addendum)
Diabetes control improved with A1c 7 today, from 8 three mo ago. Last creatinine normal on 11/2013. -Medications: continue metformin and tradjenta -Heart:  Continue aspirin, atorvastatin, and start lisinopril 2.5mg  daily. Patient was amenable to this low dose. Potential side effects discussed. Diet/exercise discussed (continue walking daily and watching what you're eating). -lipid panel when she returns in 2-4 weeks for BMET -Kidneys: BMET in 2-4 weeks due to starting lisinopril. -Feet: Repeat foot exam today normal except mild decreased sensation bilateral heels and right lateral dorsum of foot. Recommended pt check feet regularly. -Eyes: Annual Diabetic eye exam, reportedly 2 mo ago with Dr Katy Fitch and normal -Follow-up: 3 mo for next A1c.

## 2014-09-02 ENCOUNTER — Other Ambulatory Visit: Payer: Self-pay | Admitting: *Deleted

## 2014-09-02 DIAGNOSIS — E119 Type 2 diabetes mellitus without complications: Secondary | ICD-10-CM

## 2014-09-04 MED ORDER — LINAGLIPTIN 5 MG PO TABS: 5.0000 mg | ORAL_TABLET | Freq: Every day | ORAL | Status: DC

## 2014-09-05 ENCOUNTER — Ambulatory Visit: Payer: Medicare Other | Admitting: Family Medicine

## 2014-09-05 NOTE — Patient Instructions (Addendum)
WREATHA STURGEON  09/05/2014   Your procedure is scheduled on: Thursday 09/08/14  Report to [facility] Main  Entrance and follow signs to               [place] at 07:00 AM.  Call this number if you have problems the morning of surgery [phone]   Remember: ONLY 1 PERSON MAY GO WITH YOU TO SHORT STAY TO GET  READY MORNING OF [place].  Do not eat food or drink liquids :After Midnight.    Take these medicines the morning of surgery with A SIP OF WATER: levothyroxine (synthroid)                               You may not have any metal on your body including hair pins and              piercings  Do not wear jewelry, make-up, lotions, powders or perfumes, deodorant             Do not wear nail polish.  Do not shave  48 hours prior to surgery.                Do not bring valuables to the hospital. [place].  Contacts, dentures or bridgework may not be worn into surgery.  Leave suitcase in the car. After surgery it may be brought to your room.     _____________________________________________________________________           [facility] - Preparing for Surgery Before surgery, you can play an important role.  Because skin is not sterile, your skin needs to be as free of germs as possible.  You can reduce the number of germs on your skin by washing with CHG (chlorahexidine gluconate) soap before surgery.  CHG is an antiseptic cleaner which kills germs and bonds with the skin to continue killing germs even after washing. Please DO NOT use if you have an allergy to CHG or antibacterial soaps.  If your skin becomes reddened/irritated stop using the CHG and inform your nurse when you arrive at Short Stay. Do not shave (including legs and underarms) for at least 48 hours prior to the first CHG shower.  You may shave your face/neck. Please follow these instructions carefully:  1.  Shower with CHG Soap the night  before surgery and the  morning of Surgery.  2.  If you choose to wash your hair, wash your hair first as usual with your  normal  shampoo.  3.  After you shampoo, rinse your hair and body thoroughly to remove the  shampoo.                            4.  Use CHG as you would any other liquid soap.  You can apply chg directly  to the skin and wash                       Gently with a scrungie or clean washcloth.  5.  Apply the CHG Soap to your body ONLY FROM THE NECK DOWN.   Do not use on face/ open  Wound or open sores. Avoid contact with eyes, ears mouth and genitals (private parts).                       Wash face,  Genitals (private parts) with your normal soap.             6.  Wash thoroughly, paying special attention to the area where your surgery  will be performed.  7.  Thoroughly rinse your body with warm water from the neck down.  8.  DO NOT shower/wash with your normal soap after using and rinsing off  the CHG Soap.                9.  Pat yourself dry with a clean towel.            10.  Wear clean pajamas.            11.  Place clean sheets on your bed the night of your first shower and do not  sleep with pets. Day of Surgery : Do not apply any lotions/deodorants the morning of surgery.  Please wear clean clothes to the hospital/surgery center.  FAILURE TO FOLLOW THESE INSTRUCTIONS MAY RESULT IN THE CANCELLATION OF YOUR SURGERY PATIENT SIGNATURE_________________________________  NURSE SIGNATURE__________________________________  ________________________________________________________________________

## 2014-09-05 NOTE — Progress Notes (Signed)
Stress test 10/07/13 on EPIC, CT abd 08/10/14 on EPIC, EKG 09/23/13 with interpretation on LOV note 09/23/13 on EPIC

## 2014-09-06 ENCOUNTER — Encounter (HOSPITAL_COMMUNITY)
Admission: RE | Admit: 2014-09-06 | Discharge: 2014-09-06 | Disposition: A | Discharge status: Home / Self Care | Payer: Medicare Other | Source: Ambulatory Visit | Attending: Surgery | Admitting: Surgery

## 2014-09-06 ENCOUNTER — Encounter (HOSPITAL_COMMUNITY): Payer: Self-pay

## 2014-09-06 DIAGNOSIS — Z79899 Other long term (current) drug therapy: Secondary | ICD-10-CM | POA: Diagnosis not present

## 2014-09-06 DIAGNOSIS — Z8673 Personal history of transient ischemic attack (TIA), and cerebral infarction without residual deficits: Secondary | ICD-10-CM | POA: Diagnosis not present

## 2014-09-06 DIAGNOSIS — K8010 Calculus of gallbladder with chronic cholecystitis without obstruction: Secondary | ICD-10-CM | POA: Diagnosis not present

## 2014-09-06 DIAGNOSIS — E119 Type 2 diabetes mellitus without complications: Secondary | ICD-10-CM | POA: Diagnosis not present

## 2014-09-06 DIAGNOSIS — R569 Unspecified convulsions: Secondary | ICD-10-CM | POA: Diagnosis not present

## 2014-09-06 DIAGNOSIS — D649 Anemia, unspecified: Secondary | ICD-10-CM | POA: Diagnosis not present

## 2014-09-06 DIAGNOSIS — F419 Anxiety disorder, unspecified: Secondary | ICD-10-CM | POA: Diagnosis not present

## 2014-09-06 DIAGNOSIS — K436 Other and unspecified ventral hernia with obstruction, without gangrene: Secondary | ICD-10-CM | POA: Diagnosis not present

## 2014-09-06 DIAGNOSIS — I10 Essential (primary) hypertension: Secondary | ICD-10-CM | POA: Diagnosis not present

## 2014-09-06 DIAGNOSIS — M1990 Unspecified osteoarthritis, unspecified site: Secondary | ICD-10-CM | POA: Diagnosis not present

## 2014-09-06 DIAGNOSIS — E079 Disorder of thyroid, unspecified: Secondary | ICD-10-CM | POA: Diagnosis not present

## 2014-09-06 DIAGNOSIS — J45909 Unspecified asthma, uncomplicated: Secondary | ICD-10-CM | POA: Diagnosis not present

## 2014-09-06 DIAGNOSIS — R1013 Epigastric pain: Secondary | ICD-10-CM | POA: Diagnosis present

## 2014-09-06 DIAGNOSIS — F329 Major depressive disorder, single episode, unspecified: Secondary | ICD-10-CM | POA: Diagnosis not present

## 2014-09-06 DIAGNOSIS — N289 Disorder of kidney and ureter, unspecified: Secondary | ICD-10-CM | POA: Diagnosis not present

## 2014-09-06 HISTORY — DX: Unspecified convulsions: R56.9

## 2014-09-06 HISTORY — DX: Major depressive disorder, single episode, unspecified: F32.9

## 2014-09-06 HISTORY — DX: Unspecified asthma, uncomplicated: J45.909

## 2014-09-06 HISTORY — DX: Essential (primary) hypertension: I10

## 2014-09-06 HISTORY — DX: Depression, unspecified: F32.A

## 2014-09-06 HISTORY — DX: Cerebral infarction, unspecified: I63.9

## 2014-09-06 HISTORY — DX: Angina pectoris, unspecified: I20.9

## 2014-09-06 LAB — CBC
HCT: 37.9 % (ref 36.0–46.0)
Hemoglobin: 11.9 g/dL — ABNORMAL LOW (ref 12.0–15.0)
MCH: 25.1 pg — ABNORMAL LOW (ref 26.0–34.0)
MCHC: 31.4 g/dL (ref 30.0–36.0)
MCV: 80 fL (ref 78.0–100.0)
Platelets: 214 10*3/uL (ref 150–400)
RBC: 4.74 MIL/uL (ref 3.87–5.11)
RDW: 12.8 % (ref 11.5–15.5)
WBC: 4 10*3/uL (ref 4.0–10.5)

## 2014-09-06 LAB — BASIC METABOLIC PANEL
Anion gap: 9 (ref 5–15)
BUN: 10 mg/dL (ref 6–20)
CALCIUM: 9.1 mg/dL (ref 8.9–10.3)
CHLORIDE: 107 mmol/L (ref 101–111)
CO2: 24 mmol/L (ref 22–32)
Creatinine, Ser: 0.73 mg/dL (ref 0.44–1.00)
GFR calc Af Amer: >60 mL/min (ref >60)
GLUCOSE: 112 mg/dL — AB (ref 65–99)
POTASSIUM: 3.8 mmol/L (ref 3.5–5.1)
SODIUM: 140 mmol/L (ref 135–145)

## 2014-09-07 ENCOUNTER — Other Ambulatory Visit: Payer: Medicare Other

## 2014-09-07 DIAGNOSIS — I10 Essential (primary) hypertension: Secondary | ICD-10-CM

## 2014-09-07 DIAGNOSIS — E089 Diabetes mellitus due to underlying condition without complications: Secondary | ICD-10-CM

## 2014-09-07 LAB — LIPID PANEL
CHOL/HDL RATIO: 3.3 ratio
Cholesterol: 113 mg/dL (ref 0–200)
HDL: 34 mg/dL — AB (ref >=46)
LDL Cholesterol: 68 mg/dL (ref 0–99)
Triglycerides: 57 mg/dL (ref <150)
VLDL: 11 mg/dL (ref 0–40)

## 2014-09-07 LAB — BASIC METABOLIC PANEL
BUN: 13 mg/dL (ref 6–23)
CHLORIDE: 107 meq/L (ref 96–112)
CO2: 24 meq/L (ref 19–32)
CREATININE: 0.76 mg/dL (ref 0.50–1.10)
Calcium: 9.1 mg/dL (ref 8.4–10.5)
Glucose, Bld: 104 mg/dL — ABNORMAL HIGH (ref 70–99)
Potassium: 3.9 mEq/L (ref 3.5–5.3)
Sodium: 140 mEq/L (ref 135–145)

## 2014-09-07 NOTE — H&P (Signed)
Lori Jones 08/26/2014 10:43 AM Location: [facility] Patient #: [id number] DOB: 1960/05/08 Single / Language: [name] / Race: Black or African American Female  History of Present Illness (Kerriann Kamphuis A. Ninfa Linden MD; 08/26/2014 10:57 AM) Patient words: gallbladder.  The patient is a 54 year old female who presents for evaluation of gall stones. This is a pleasant female referred by Dr. Collene Mares for evaluation of symptomatic cholelithiasis as well as a ventral hernia. She has known for at least 6-7 years that she has had gallstones. She is only recently become symptomatic with epigastric abdominal pain, pain in the right upper quadrant, and periumbilical discomfort after fatty meals. She has had nausea but no vomiting. The pain can be severe at times. Because of her discomfort, a CAT scan was performed which also shows several small ventral hernias above the umbilicus with incarcerated omentum. Today, she is actually well and without any discomfort. She has no history of jaundice. She moved her bowels well.   Other Problems Marjean Donna, CMA; 08/26/2014 10:43 AM) Anxiety Disorder Arthritis Back Pain Chest pain Diabetes Mellitus Heart murmur Thyroid Disease Transfusion history  Past Surgical History Marjean Donna, CMA; 08/26/2014 10:43 AM) Cesarean Section - 1 Hysterectomy (not due to cancer) - Partial  Diagnostic Studies History Marjean Donna, CMA; 08/26/2014 10:43 AM) Colonoscopy 1-5 years ago Mammogram within last year Pap Smear 1-5 years ago  Allergies Marjean Donna, CMA; 08/26/2014 10:45 AM) [name] *MACROLIDES* Imitrex *MIGRAINE PRODUCTS*  Medication History (Sonya Bynum, CMA; 08/26/2014 10:45 AM) Atorvastatin Calcium (40MG  Tablet, Oral) Active. Escitalopram Oxalate (10MG  Tablet, Oral) Active. Levothyroxine Sodium (25MCG Tablet, Oral) Active. MetFORMIN HCl (1000MG  Tablet, Oral) Active. Promethazine HCl (25MG  Tablet, Oral) Active. Accu-Chek Aviva  Plus (In Vitro) Active. Omeprazole (40MG  Capsule DR, Oral) Active. Simvastatin (20MG  Tablet, Oral) Active. Tradjenta (5MG  Tablet, Oral) Active. Medications Reconciled  Social History Marjean Donna, CMA; 08/26/2014 10:43 AM) No alcohol use No caffeine use No drug use Tobacco use Never smoker.  Family History Marjean Donna, [place]; 08/26/2014 10:43 AM) Arthritis Mother. Colon Cancer Father. Depression Brother, Mother. Diabetes Mellitus Mother. Heart Disease Mother. Heart disease in female family member before age 17 Hypertension Mother. Malignant Neoplasm Of Pancreas Mother.  Pregnancy / Birth History Marjean Donna, [place]; 08/26/2014 10:43 AM) Age at menarche 51 years. Gravida 1 Maternal age 22-25 Para 1  Review of Systems ([place]; 08/26/2014 10:43 AM) General Not Present- Appetite Loss, Chills, Fatigue, Fever, Night Sweats, Weight Gain and Weight Loss. Skin Not Present- Change in Wart/Mole, Dryness, Hives, Jaundice, New Lesions, Non-Healing Wounds, Rash and Ulcer. HEENT Present- Wears glasses/contact lenses. Not Present- Earache, Hearing Loss, Hoarseness, Nose Bleed, Oral Ulcers, Ringing in the Ears, Seasonal Allergies, Sinus Pain, Sore Throat, Visual Disturbances and Yellow Eyes. Respiratory Not Present- Bloody sputum, Chronic Cough, Difficulty Breathing, Snoring and Wheezing. Breast Present- Breast Mass. Not Present- Breast Pain, Nipple Discharge and Skin Changes. Cardiovascular Not Present- Chest Pain, Difficulty Breathing Lying Down, Leg Cramps, Palpitations, Rapid Heart Rate, Shortness of Breath and Swelling of Extremities. Gastrointestinal Present- Abdominal Pain and Nausea. Not Present- Bloating, Bloody Stool, Change in Bowel Habits, Chronic diarrhea, Constipation, Difficulty Swallowing, Excessive gas, Gets full quickly at meals, Hemorrhoids, Indigestion, Rectal Pain and Vomiting. Female Genitourinary Not Present- Frequency, Nocturia, Painful Urination,  Pelvic Pain and Urgency. Musculoskeletal Present- Back Pain. Not Present- Joint Pain, Joint Stiffness, Muscle Pain, Muscle Weakness and Swelling of Extremities. Neurological Not Present- Decreased Memory, Fainting, Headaches, Numbness, Seizures, Tingling, Tremor, Trouble walking and Weakness. Psychiatric Present- Anxiety. Not Present- Bipolar,  Change in Sleep Pattern, Depression, Fearful and Frequent crying. Endocrine Present- Hair Changes and Hot flashes. Not Present- Cold Intolerance, Excessive Hunger, Heat Intolerance and New Diabetes. Hematology Not Present- Easy Bruising, Excessive bleeding, Gland problems, HIV and Persistent Infections.   Vitals (Sonya Bynum CMA; 08/26/2014 10:44 AM) 08/26/2014 10:44 AM Weight: 192 lb Height: 68in Body Surface Area: 2.04 m Body Mass Index: 29.19 kg/m Temp.: 56F(Temporal)  Pulse: 74 (Regular)  BP: 128/74 (Sitting, Left Arm, Standard)    Physical Exam ([place] Throckmorton A. Ninfa Linden MD; 08/26/2014 10:58 AM) General Mental Status-Alert. General Appearance-Consistent with stated age. Hydration-Well hydrated. Voice-Normal.  Head and Neck Head-normocephalic, atraumatic with no lesions or palpable masses.  Eye Eyeball - Bilateral-Extraocular movements intact. Sclera/Conjunctiva - Bilateral-No scleral icterus.  Chest and Lung Exam Chest and lung exam reveals -quiet, even and easy respiratory effort with no use of accessory muscles and on auscultation, normal breath sounds, no adventitious sounds and normal vocal resonance. Inspection Chest Wall - Normal. Back - normal.  Cardiovascular Cardiovascular examination reveals -on palpation PMI is normal in location and amplitude, no palpable S3 or S4. Normal cardiac borders., normal heart sounds, regular rate and rhythm with no murmurs, carotid auscultation reveals no bruits and normal pedal pulses bilaterally.  Abdomen Inspection Inspection of the abdomen reveals - No Hernias.  Skin - Scar - no surgical scars. Palpation/Percussion Palpation and Percussion of the abdomen reveal - Soft, Non Tender, No Rebound tenderness, No Rigidity (guarding) and No hepatosplenomegaly. Auscultation Auscultation of the abdomen reveals - Bowel sounds normal. Note: I can feel the small incarcerated hernias in the upper midline which are nontender   Neurologic Neurologic evaluation reveals -alert and oriented x 3 with no impairment of recent or remote memory. Mental Status-Normal.  Musculoskeletal Normal Exam - Left-Upper Extremity Strength Normal and Lower Extremity Strength Normal. Normal Exam - Right-Upper Extremity Strength Normal, Lower Extremity Weakness.    Assessment & Plan (Elly Haffey A. Ninfa Linden MD; 08/26/2014 11:02 AM) SYMPTOMATIC CHOLELITHIASIS (574.20  K80.20) Impression: Laparoscopic cholecystectomy is recommended. I discussed this with her in detail. I discussed the risk of surgery which includes but is not limited to bleeding, infection, injury to surrounding structures, the need to convert to an open procedure, bile duct injury, bile leak, DVT, cardiopulmonary issues, postoperative recovery, etc. I discussed the hernias with her in detail as well. I can repair these primarily at the time of her surgery would not be able to use mesh for risk of contamination. She understands and wishes to proceed with a laparoscopic cholecystectomy as well as the ventral hernia repair Current Plans  Pt Education - Pamphlet Given - Laparoscopic Gallbladder Surgery: discussed with patient and provided information. VENTRAL HERNIA (553.20  K43.9)    Signed by Harl Bowie, MD (08/26/2014 11:02 AM)

## 2014-09-07 NOTE — Progress Notes (Signed)
Bmp and flp done today [facility] Jenayah Antu

## 2014-09-08 ENCOUNTER — Observation Stay (HOSPITAL_COMMUNITY)
Admission: RE | Admit: 2014-09-08 | Discharge: 2014-09-09 | Disposition: A | Discharge status: Home / Self Care | Payer: Medicare Other | Source: Ambulatory Visit | Attending: Surgery | Admitting: Surgery

## 2014-09-08 ENCOUNTER — Ambulatory Visit (HOSPITAL_COMMUNITY): Payer: Medicare Other | Admitting: Certified Registered"

## 2014-09-08 ENCOUNTER — Encounter (HOSPITAL_COMMUNITY): Payer: Self-pay | Admitting: Anesthesiology

## 2014-09-08 ENCOUNTER — Encounter (HOSPITAL_COMMUNITY)
Admission: RE | Disposition: A | Discharge status: Home / Self Care | Payer: Self-pay | Source: Ambulatory Visit | Attending: Surgery

## 2014-09-08 DIAGNOSIS — F419 Anxiety disorder, unspecified: Secondary | ICD-10-CM | POA: Insufficient documentation

## 2014-09-08 DIAGNOSIS — R569 Unspecified convulsions: Secondary | ICD-10-CM | POA: Insufficient documentation

## 2014-09-08 DIAGNOSIS — M1990 Unspecified osteoarthritis, unspecified site: Secondary | ICD-10-CM | POA: Insufficient documentation

## 2014-09-08 DIAGNOSIS — Z8673 Personal history of transient ischemic attack (TIA), and cerebral infarction without residual deficits: Secondary | ICD-10-CM | POA: Insufficient documentation

## 2014-09-08 DIAGNOSIS — D649 Anemia, unspecified: Secondary | ICD-10-CM | POA: Insufficient documentation

## 2014-09-08 DIAGNOSIS — E119 Type 2 diabetes mellitus without complications: Secondary | ICD-10-CM | POA: Insufficient documentation

## 2014-09-08 DIAGNOSIS — K8020 Calculus of gallbladder without cholecystitis without obstruction: Secondary | ICD-10-CM | POA: Diagnosis not present

## 2014-09-08 DIAGNOSIS — E079 Disorder of thyroid, unspecified: Secondary | ICD-10-CM | POA: Insufficient documentation

## 2014-09-08 DIAGNOSIS — F329 Major depressive disorder, single episode, unspecified: Secondary | ICD-10-CM | POA: Insufficient documentation

## 2014-09-08 DIAGNOSIS — N289 Disorder of kidney and ureter, unspecified: Secondary | ICD-10-CM | POA: Insufficient documentation

## 2014-09-08 DIAGNOSIS — K436 Other and unspecified ventral hernia with obstruction, without gangrene: Secondary | ICD-10-CM | POA: Insufficient documentation

## 2014-09-08 DIAGNOSIS — Z79899 Other long term (current) drug therapy: Secondary | ICD-10-CM | POA: Insufficient documentation

## 2014-09-08 DIAGNOSIS — K8010 Calculus of gallbladder with chronic cholecystitis without obstruction: Principal | ICD-10-CM | POA: Insufficient documentation

## 2014-09-08 DIAGNOSIS — J45909 Unspecified asthma, uncomplicated: Secondary | ICD-10-CM | POA: Insufficient documentation

## 2014-09-08 DIAGNOSIS — I10 Essential (primary) hypertension: Secondary | ICD-10-CM | POA: Insufficient documentation

## 2014-09-08 HISTORY — PX: CHOLECYSTECTOMY: SHX55

## 2014-09-08 HISTORY — PX: VENTRAL HERNIA REPAIR: SHX424

## 2014-09-08 LAB — GLUCOSE, CAPILLARY
GLUCOSE-CAPILLARY: 149 mg/dL — AB (ref 65–99)
GLUCOSE-CAPILLARY: 153 mg/dL — AB (ref 65–99)
GLUCOSE-CAPILLARY: 182 mg/dL — AB (ref 65–99)
GLUCOSE-CAPILLARY: 168 mg/dL — AB (ref 65–99)
Glucose-Capillary: 103 mg/dL — ABNORMAL HIGH (ref 65–99)
Glucose-Capillary: 117 mg/dL — ABNORMAL HIGH (ref 65–99)

## 2014-09-08 SURGERY — LAPAROSCOPIC CHOLECYSTECTOMY
Anesthesia: General | Site: Abdomen

## 2014-09-08 MED ORDER — NEOSTIGMINE METHYLSULFATE 10 MG/10ML IV SOLN
INTRAVENOUS | Status: DC | PRN
  Administered 2014-09-08: 1 mg via INTRAVENOUS
  Administered 2014-09-08: 4 mg via INTRAVENOUS

## 2014-09-08 MED ORDER — BUPIVACAINE HCL (PF) 0.5 % IJ SOLN
INTRAMUSCULAR | Status: DC | PRN
  Administered 2014-09-08: 30 mL

## 2014-09-08 MED ORDER — PROPOFOL 10 MG/ML IV BOLUS
INTRAVENOUS | Status: DC | PRN
  Administered 2014-09-08: 200 mg via INTRAVENOUS

## 2014-09-08 MED ORDER — INSULIN ASPART 100 UNIT/ML ~~LOC~~ SOLN
0.0000 [IU] | Freq: Three times a day (TID) | SUBCUTANEOUS | Status: DC
  Administered 2014-09-08: 3 [IU] via SUBCUTANEOUS

## 2014-09-08 MED ORDER — LACTATED RINGERS IV SOLN
INTRAVENOUS | Status: DC
  Administered 2014-09-08: 09:00:00 via INTRAVENOUS
  Administered 2014-09-08: 1000 mL via INTRAVENOUS
  Administered 2014-09-08: 11:00:00 via INTRAVENOUS

## 2014-09-08 MED ORDER — FENTANYL CITRATE (PF) 250 MCG/5ML IJ SOLN
INTRAMUSCULAR | Status: AC
  Filled 2014-09-08: qty 5

## 2014-09-08 MED ORDER — DEXAMETHASONE SODIUM PHOSPHATE 4 MG/ML IJ SOLN
INTRAMUSCULAR | Status: DC | PRN
  Administered 2014-09-08: 10 mg via INTRAVENOUS

## 2014-09-08 MED ORDER — HYDROCODONE-ACETAMINOPHEN 5-325 MG PO TABS
1.0000 | ORAL_TABLET | ORAL | Status: DC | PRN
  Administered 2014-09-08 – 2014-09-09 (×2): 1 via ORAL
  Filled 2014-09-08 (×2): qty 1

## 2014-09-08 MED ORDER — DEXAMETHASONE SODIUM PHOSPHATE 10 MG/ML IJ SOLN
INTRAMUSCULAR | Status: AC
  Filled 2014-09-08: qty 1

## 2014-09-08 MED ORDER — IBUPROFEN 200 MG PO TABS: 600.0000 mg | ORAL_TABLET | Freq: Four times a day (QID) | ORAL | Status: DC | PRN

## 2014-09-08 MED ORDER — CEFAZOLIN SODIUM-DEXTROSE 2-3 GM-% IV SOLR
2.0000 g | INTRAVENOUS | Status: AC
  Administered 2014-09-08: 2 g via INTRAVENOUS

## 2014-09-08 MED ORDER — POTASSIUM CHLORIDE IN NACL 20-0.9 MEQ/L-% IV SOLN
INTRAVENOUS | Status: DC
  Administered 2014-09-08: 15:00:00 via INTRAVENOUS
  Administered 2014-09-09: 1000 mL via INTRAVENOUS
  Filled 2014-09-08 (×3): qty 1000

## 2014-09-08 MED ORDER — PROPOFOL 10 MG/ML IV BOLUS
INTRAVENOUS | Status: AC
  Filled 2014-09-08: qty 20

## 2014-09-08 MED ORDER — HYDROMORPHONE HCL 1 MG/ML IJ SOLN
INTRAMUSCULAR | Status: AC
  Filled 2014-09-08: qty 1

## 2014-09-08 MED ORDER — MENTHOL 3 MG MT LOZG
1.0000 | LOZENGE | OROMUCOSAL | Status: DC | PRN
  Filled 2014-09-08: qty 9

## 2014-09-08 MED ORDER — NEOSTIGMINE METHYLSULFATE 10 MG/10ML IV SOLN
INTRAVENOUS | Status: AC
  Filled 2014-09-08: qty 1

## 2014-09-08 MED ORDER — ONDANSETRON HCL 4 MG PO TABS: 4.0000 mg | ORAL_TABLET | Freq: Four times a day (QID) | ORAL | Status: DC | PRN

## 2014-09-08 MED ORDER — CETYLPYRIDINIUM CHLORIDE 0.05 % MT LIQD
7.0000 mL | Freq: Two times a day (BID) | OROMUCOSAL | Status: DC
  Administered 2014-09-08: 7 mL via OROMUCOSAL

## 2014-09-08 MED ORDER — LIDOCAINE HCL (CARDIAC) 20 MG/ML IV SOLN
INTRAVENOUS | Status: DC | PRN
  Administered 2014-09-08: 50 mg via INTRAVENOUS

## 2014-09-08 MED ORDER — ONDANSETRON HCL 4 MG/2ML IJ SOLN: 4.0000 mg | Freq: Four times a day (QID) | INTRAMUSCULAR | Status: DC | PRN

## 2014-09-08 MED ORDER — LIDOCAINE HCL (CARDIAC) 20 MG/ML IV SOLN
INTRAVENOUS | Status: AC
  Filled 2014-09-08: qty 5

## 2014-09-08 MED ORDER — MIDAZOLAM HCL 5 MG/5ML IJ SOLN
INTRAMUSCULAR | Status: DC | PRN
  Administered 2014-09-08: 2 mg via INTRAVENOUS

## 2014-09-08 MED ORDER — MIDAZOLAM HCL 2 MG/2ML IJ SOLN
INTRAMUSCULAR | Status: AC
  Filled 2014-09-08: qty 2

## 2014-09-08 MED ORDER — ONDANSETRON HCL 4 MG/2ML IJ SOLN
INTRAMUSCULAR | Status: DC | PRN
  Administered 2014-09-08: 4 mg via INTRAVENOUS

## 2014-09-08 MED ORDER — ENOXAPARIN SODIUM 40 MG/0.4ML ~~LOC~~ SOLN
40.0000 mg | SUBCUTANEOUS | Status: DC
  Administered 2014-09-09: 40 mg via SUBCUTANEOUS
  Filled 2014-09-08 (×2): qty 0.4

## 2014-09-08 MED ORDER — GLYCOPYRROLATE 0.2 MG/ML IJ SOLN
INTRAMUSCULAR | Status: AC
  Filled 2014-09-08: qty 1

## 2014-09-08 MED ORDER — ROCURONIUM BROMIDE 100 MG/10ML IV SOLN
INTRAVENOUS | Status: DC | PRN
  Administered 2014-09-08: 20 mg via INTRAVENOUS
  Administered 2014-09-08: 30 mg via INTRAVENOUS

## 2014-09-08 MED ORDER — FENTANYL CITRATE (PF) 100 MCG/2ML IJ SOLN
INTRAMUSCULAR | Status: DC | PRN
  Administered 2014-09-08 (×2): 50 ug via INTRAVENOUS

## 2014-09-08 MED ORDER — BUPIVACAINE HCL (PF) 0.5 % IJ SOLN
INTRAMUSCULAR | Status: AC
  Filled 2014-09-08: qty 30

## 2014-09-08 MED ORDER — INSULIN ASPART 100 UNIT/ML ~~LOC~~ SOLN: 0.0000 [IU] | Freq: Every day | SUBCUTANEOUS | Status: DC

## 2014-09-08 MED ORDER — HYDROMORPHONE HCL 1 MG/ML IJ SOLN
0.2500 mg | INTRAMUSCULAR | Status: DC | PRN
  Administered 2014-09-08 (×2): 0.5 mg via INTRAVENOUS

## 2014-09-08 MED ORDER — GLYCOPYRROLATE 0.2 MG/ML IJ SOLN
INTRAMUSCULAR | Status: DC | PRN
  Administered 2014-09-08: .2 mg via INTRAVENOUS
  Administered 2014-09-08: 0.6 mg via INTRAVENOUS

## 2014-09-08 MED ORDER — ONDANSETRON HCL 4 MG/2ML IJ SOLN
INTRAMUSCULAR | Status: AC
  Filled 2014-09-08: qty 2

## 2014-09-08 MED ORDER — MORPHINE SULFATE 2 MG/ML IJ SOLN: 1.0000 mg | INTRAMUSCULAR | Status: DC | PRN

## 2014-09-08 MED ORDER — LABETALOL HCL 5 MG/ML IV SOLN
INTRAVENOUS | Status: DC | PRN
  Administered 2014-09-08 (×2): 5 mg via INTRAVENOUS

## 2014-09-08 MED ORDER — CEFAZOLIN SODIUM-DEXTROSE 2-3 GM-% IV SOLR
INTRAVENOUS | Status: AC
  Filled 2014-09-08: qty 50

## 2014-09-08 SURGICAL SUPPLY — 31 items
APPLIER CLIP 5 13 M/L LIGAMAX5 (MISCELLANEOUS) ×2
BANDAGE ADH SHEER 1  50/CT (GAUZE/BANDAGES/DRESSINGS) IMPLANT
BENZOIN TINCTURE PRP APPL 2/3 (GAUZE/BANDAGES/DRESSINGS) IMPLANT
CHLORAPREP W/TINT 26ML (MISCELLANEOUS) ×2 IMPLANT
CLIP APPLIE 5 13 M/L LIGAMAX5 (MISCELLANEOUS) ×1 IMPLANT
COVER MAYO STAND STRL (DRAPES) IMPLANT
DECANTER SPIKE VIAL GLASS SM (MISCELLANEOUS) IMPLANT
DRAPE C-ARM 42X120 X-RAY (DRAPES) IMPLANT
DRAPE LAPAROSCOPIC ABDOMINAL (DRAPES) ×2 IMPLANT
DRAPE UTILITY XL STRL (DRAPES) ×2 IMPLANT
ELECT PENCIL ROCKER SW 15FT (MISCELLANEOUS) ×2 IMPLANT
ELECT REM PT RETURN 9FT ADLT (ELECTROSURGICAL) ×2
ELECTRODE REM PT RTRN 9FT ADLT (ELECTROSURGICAL) ×1 IMPLANT
GLOVE SURG SIGNA 7.5 PF LTX (GLOVE) ×2 IMPLANT
GOWN STRL REUS W/TWL XL LVL3 (GOWN DISPOSABLE) ×4 IMPLANT
HEMOSTAT SURGICEL 4X8 (HEMOSTASIS) IMPLANT
KIT BASIN OR (CUSTOM PROCEDURE TRAY) ×2 IMPLANT
LIQUID BAND (GAUZE/BANDAGES/DRESSINGS) ×2 IMPLANT
PEN SKIN MARKING BROAD (MISCELLANEOUS) ×2 IMPLANT
POUCH SPECIMEN RETRIEVAL 10MM (ENDOMECHANICALS) ×2 IMPLANT
SET CHOLANGIOGRAPH MIX (MISCELLANEOUS) IMPLANT
SET IRRIG TUBING LAPAROSCOPIC (IRRIGATION / IRRIGATOR) ×2 IMPLANT
STRIP CLOSURE SKIN 1/2X4 (GAUZE/BANDAGES/DRESSINGS) ×2 IMPLANT
SUT MNCRL AB 4-0 PS2 18 (SUTURE) ×4 IMPLANT
SUT PROLENE 1 CT 1 30 (SUTURE) ×2 IMPLANT
TOWEL OR 17X26 10 PK STRL BLUE (TOWEL DISPOSABLE) ×2 IMPLANT
TOWEL OR NON WOVEN STRL DISP B (DISPOSABLE) ×2 IMPLANT
TRAY LAPAROSCOPIC (CUSTOM PROCEDURE TRAY) ×2 IMPLANT
TROCAR BLADELESS OPT 5 75 (ENDOMECHANICALS) ×2 IMPLANT
TROCAR SLEEVE XCEL 5X75 (ENDOMECHANICALS) ×4 IMPLANT
TROCAR XCEL BLUNT TIP 100MML (ENDOMECHANICALS) ×2 IMPLANT

## 2014-09-08 NOTE — Interval H&P Note (Signed)
History and Physical Interval Note:no change in H and P  09/08/2014 7:56 AM  Lori Jones  has presented today for surgery, with the diagnosis of Cholelithiasis  The various methods of treatment have been discussed with the patient and family. After consideration of risks, benefits and other options for treatment, the patient has consented to  Procedure(s): LAPAROSCOPIC CHOLECYSTECTOMY (N/A) [name] (N/A) as a surgical intervention .  The patient's history has been reviewed, patient examined, no change in status, stable for surgery.  I have reviewed the patient's chart and labs.  Questions were answered to the patient's satisfaction.     Gjon Letarte A

## 2014-09-08 NOTE — Anesthesia Postprocedure Evaluation (Signed)
  Anesthesia Post-op Note  Patient: Lori Jones  Procedure(s) Performed: Procedure(s) (LRB): LAPAROSCOPIC CHOLECYSTECTOMY (N/A) VENTRAL HERNIA REPAIR (N/A)  Patient Location: PACU  Anesthesia Type: General  Level of Consciousness: awake and alert   Airway and Oxygen Therapy: Patient Spontanous Breathing  Post-op Pain: mild  Post-op Assessment: Post-op Vital signs reviewed, Patient's Cardiovascular Status Stable, Respiratory Function Stable, Patent Airway and No signs of Nausea or vomiting  Last Vitals:  Filed Vitals:   09/08/14 1416  BP: 128/72  Pulse: 68  Temp: 36.4 C  Resp: 16    Post-op Vital Signs: stable   Complications: No apparent anesthesia complications

## 2014-09-08 NOTE — Anesthesia Procedure Notes (Addendum)
Date/Time: 09/08/2014 8:40 AM Performed by: Pilar Grammes Pre-anesthesia Checklist: Patient identified, Emergency Drugs available, Suction available, Patient being monitored and Timeout performed Patient Re-evaluated:Patient Re-evaluated prior to inductionOxygen Delivery Method: Circle system utilized Preoxygenation: Pre-oxygenation with 100% oxygen Intubation Type: IV induction Ventilation: Mask ventilation without difficulty Laryngoscope Size: Miller and 2 Grade View: Grade III Tube type: Oral Tube size: 7.5 mm Number of attempts: 1 Airway Equipment and Method: Patient positioned with wedge pillow Placement Confirmation: ETT inserted through vocal cords under direct vision,  positive ETCO2 and breath sounds checked- equal and bilateral Secured at: 23 cm   Procedure Name: Intubation Performed by: Pilar Grammes Pre-anesthesia Checklist: Patient identified, Emergency Drugs available, Suction available, Patient being monitored and Timeout performed Patient Re-evaluated:Patient Re-evaluated prior to inductionOxygen Delivery Method: Circle system utilized Preoxygenation: Pre-oxygenation with 100% oxygen Intubation Type: IV induction Ventilation: Mask ventilation without difficulty Laryngoscope Size: Miller and 3 Grade View: Grade III Tube type: Oral Tube size: 7.5 mm Number of attempts: 1 Airway Equipment and Method: Stylet Placement Confirmation: positive ETCO2,  ETT inserted through vocal cords under direct vision,  CO2 detector and breath sounds checked- equal and bilateral Tube secured with: Tape Dental Injury: Teeth and Oropharynx as per pre-operative assessment

## 2014-09-08 NOTE — Transfer of Care (Signed)
Immediate Anesthesia Transfer of Care Note  Patient: Lori Jones  Procedure(s) Performed: Procedure(s): LAPAROSCOPIC CHOLECYSTECTOMY (N/A) VENTRAL HERNIA REPAIR (N/A)  Patient Location: PACU  Anesthesia Type:General  Level of Consciousness: alert , oriented and patient cooperative  Airway & Oxygen Therapy: Patient connected to face mask oxygen  Post-op Assessment: Post -op Vital signs reviewed and stable and Patient moving all extremities  Post vital signs: stable  Last Vitals:  Filed Vitals:   09/08/14 0708  BP: 137/75  Pulse: 72  Temp: 36.6 C  Resp: 16    Complications: No apparent anesthesia complications

## 2014-09-08 NOTE — Anesthesia Preprocedure Evaluation (Signed)
Anesthesia Evaluation  Patient identified by MRN, date of birth, ID band Patient awake    Reviewed: Allergy & Precautions, NPO status , Patient's Chart, lab work & pertinent test results  Airway Mallampati: II  TM Distance: >3 FB Neck ROM: Full    Dental no notable dental hx.    Pulmonary asthma ,  breath sounds clear to auscultation  Pulmonary exam normal       Cardiovascular Exercise Tolerance: Good hypertension, Pt. on medications + angina Normal cardiovascular exam+ Valvular Problems/Murmurs Rhythm:Regular Rate:Normal     Neuro/Psych Seizures -,  PSYCHIATRIC DISORDERS Anxiety Depression CVA    GI/Hepatic negative GI ROS, Neg liver ROS,   Endo/Other  diabetes, Type 2, Oral Hypoglycemic AgentsHypothyroidism   Renal/GU Renal disease  negative genitourinary   Musculoskeletal  (+) Arthritis -,   Abdominal   Peds negative pediatric ROS (+)  Hematology  (+) anemia ,   Anesthesia Other Findings   Reproductive/Obstetrics negative OB ROS                             Anesthesia Physical Anesthesia Plan  ASA: III  Anesthesia Plan: General   Post-op Pain Management:    Induction: Intravenous  Airway Management Planned: Oral ETT  Additional Equipment:   Intra-op Plan:   Post-operative Plan: Extubation in OR  Informed Consent: I have reviewed the patients History and Physical, chart, labs and discussed the procedure including the risks, benefits and alternatives for the proposed anesthesia with the patient or authorized representative who has indicated his/her understanding and acceptance.   Dental advisory given  Plan Discussed with: CRNA  Anesthesia Plan Comments:         Anesthesia Quick Evaluation

## 2014-09-08 NOTE — Op Note (Signed)
NAMEGLADIE, GRAVETTE               ACCOUNT NO.:  [id number]  MEDICAL RECORD NO.:  [id number]  LOCATION:  4481                         FACILITY:  [facility]  PHYSICIAN:  Coralie Keens, M.D. DATE OF BIRTH:  May 17, 1960  DATE OF PROCEDURE:  09/08/2014 DATE OF DISCHARGE:                              OPERATIVE REPORT   PREOPERATIVE DIAGNOSES: 1. Symptomatic cholelithiasis. 2. Ventral hernia.  POSTOPERATIVE DIAGNOSES: 1. Symptomatic cholelithiasis. 2. Ventral hernia.  PROCEDURES: 1. Laparoscopic cholecystectomy. 2. Ventral hernia repair.  SURGEON:  Coralie Keens, M.D.  ANESTHESIA:  General and 0.5% Marcaine.  ESTIMATED BLOOD LOSS:  Minimal.  FINDINGS:  The patient was found to have a chronically scarred-appearing gallbladder consistent with chronic cholecystitis.  There was a small ventral hernia in the upper midline containing incarcerated omentum. The actual fascial defect was only approximately 1 cm in size.  It was repaired primarily.  PROCEDURE IN DETAIL:  The patient was brought to the operating room, identified as Lori Jones.  She was placed supine on the operating room table and general anesthesia was induced.  Her abdomen was then prepped and draped in usual sterile fashion.  I made a small incision just below the umbilicus, the previous scar with a scalpel.  I took this down to the fascia, which was then opened with a scalpel.  A hemostat was used to pass the peritoneal cavity under direct vision.  A 0 Vicryl pursestring suture was then placed around the fascial opening.  The Hasson port was placed through the opening and insufflation of the abdomen was begun.  The patient was found to have a small ventral hernia containing incarcerated omentum between the umbilicus and epigastrium. I was able to easily reduce the omentum from this area after I had my other trocars in place.  I placed a 5-mm port in the patient's epigastrium and two more in the right upper  quadrant under direct vision.  The gallbladder was then grasped and retracted above the liver bed.  It was found to be thick walled in appearance.  The cystic duct and cystic artery were easily dissected out and a critical window was achieved around both.  Each was clipped several times proximally, distally and transected with the laparoscopic scissors.  I then slowly dissected free the gallbladder from the liver bed with the electrocautery.  Once this was free from liver bed, I placed an Endosac and removed it through the incision at the umbilicus.  I then thoroughly irrigated the abdomen with normal saline.  Hemostasis appeared to be achieved.  I then closed the incision at the umbilicus with a 0 Vicryl suture.  All ports were removed and the abdomen was deflated.  I then made a small incision in the patient's upper midline with a scalpel and took this down to the hernia sac, which I was able to easily identified. I then elevated the hernia sac and transected with electrocautery.  I then closed the small fascial defect with a figure-of-eight #1 Prolene suture.  I then irrigated all incision with saline.  I anesthetized all incisions with Marcaine and then closed them all with 4-0 Monocryl subcuticular sutures.  Skin glue was then applied.  The patient  tolerated the procedure well.  All the counts were correct at the end of procedure.  The patient was then extubated in the operating room and taken in stable condition to recovery room.     Coralie Keens, M.D.     DB/MEDQ  D:  09/08/2014  T:  09/08/2014  Job:  [id number]

## 2014-09-08 NOTE — Op Note (Signed)
LAPAROSCOPIC CHOLECYSTECTOMY, VENTRAL HERNIA REPAIR  Procedure Note  Lori Jones 09/08/2014   Pre-op Diagnosis: Cholelithiasis     Post-op Diagnosis: same  Procedure(s): LAPAROSCOPIC CHOLECYSTECTOMY VENTRAL HERNIA REPAIR  Surgeon(s): Coralie Keens, MD  Anesthesia: General  Staff:  Circulator: Donneta Romberg, RN Scrub Person: Rockwell Germany, CST; Joesphine Bare, RN  Estimated Blood Loss: Minimal               Specimens: sent to path          [facility]   Date: 09/08/2014  Time: 9:23 AM

## 2014-09-09 ENCOUNTER — Encounter (HOSPITAL_COMMUNITY): Payer: Self-pay | Admitting: Surgery

## 2014-09-09 DIAGNOSIS — K8010 Calculus of gallbladder with chronic cholecystitis without obstruction: Secondary | ICD-10-CM | POA: Diagnosis not present

## 2014-09-09 LAB — GLUCOSE, CAPILLARY: GLUCOSE-CAPILLARY: 105 mg/dL — AB (ref 65–99)

## 2014-09-09 MED ORDER — HYDROCODONE-ACETAMINOPHEN 5-325 MG PO TABS: 1.0000 | ORAL_TABLET | ORAL | Status: DC | PRN

## 2014-09-09 NOTE — Discharge Summary (Signed)
Physician Discharge Summary  Patient ID: SABRINE PATCHEN MRN: [id number] DOB/AGE: Mar 28, 1961 54 y.o.  Admit date: 09/08/2014 Discharge date: 09/09/2014  Admission Diagnoses:  Discharge Diagnoses:  Active Problems:   Symptomatic cholelithiasis   Discharged Condition: good  Hospital Course: UNEVENTFUL POST OP RECOVERY  Consults: None  Significant Diagnostic Studies:   Treatments: surgery: LAP CHOLE, VENTRAL HERNIA REPAIR  Discharge Exam: Blood pressure 126/66, pulse 55, temperature 98 F (36.7 C), temperature source Oral, resp. rate 16, height 5' 8.5" (1.74 m), weight 85.446 kg (188 lb 6 oz), SpO2 100 %. General appearance: alert, cooperative and no distress Resp: clear to auscultation bilaterally Cardio: regular rate and rhythm, S1, S2 normal, no murmur, click, rub or gallop Incision/Wound:abd soft, incisions clean  Disposition: 01-Home or Self Care     Medication List    TAKE these medications        ACCU-CHEK AVIVA PLUS test strip  Generic drug:  glucose blood  USE AS DIRECTED TWICE DAILY     aspirin 81 MG tablet  Take 81 mg by mouth daily.     atorvastatin 40 MG tablet  Commonly known as:  LIPITOR  Take 1 tablet (40 mg total) by mouth daily.     Biotin 10 MG Caps  Take 2 capsules by mouth 2 (two) times daily.     escitalopram 10 MG tablet  Commonly known as:  LEXAPRO  Take 1 tablet (10 mg total) by mouth daily.     HYDROcodone-acetaminophen 5-325 MG per tablet  Commonly known as:  NORCO/VICODIN  Take 1-2 tablets by mouth every 4 (four) hours as needed for moderate pain.     levothyroxine 25 MCG tablet  Commonly known as:  SYNTHROID, LEVOTHROID  Take 1 tablet (25 mcg total) by mouth daily before breakfast. Overdue for thyroid follow up     linagliptin 5 MG Tabs tablet  Commonly known as:  TRADJENTA  Take 1 tablet (5 mg total) by mouth daily.     lisinopril 2.5 MG tablet  Commonly known as:  PRINIVIL,ZESTRIL  Take 1 tablet (2.5 mg total) by mouth  daily.     metFORMIN 1000 MG tablet  Commonly known as:  GLUCOPHAGE  Take 1 tablet (1,000 mg total) by mouth 2 (two) times daily with a meal.     omeprazole 40 MG capsule  Commonly known as:  PRILOSEC  Take 1 capsule (40 mg total) by mouth daily. For 2 weeks. Then re-evaluate.     promethazine 25 MG tablet  Commonly known as:  PHENERGAN  Take 25 mg by mouth every 6 (six) hours as needed for nausea or vomiting.     traMADol 50 MG tablet  Commonly known as:  ULTRAM  Take 50 mg by mouth every 6 (six) hours as needed for pain.           Follow-up Information    Follow up with [facility] A, MD. Schedule an appointment as soon as possible for a visit in 3 weeks.   Specialty:  General Surgery   Contact information:   [address] [place] [address] [phone]       Signed: Harl Bowie 09/09/2014, 7:41 AM

## 2014-09-09 NOTE — Progress Notes (Signed)
Patient ID: Lori Jones, female   DOB: 1960/09/26, 54 y.o.   MRN: [id number]  Doing well No complaints abd soft  Plan discharge

## 2014-09-09 NOTE — Care Management Note (Addendum)
Case Management Note  Patient Details  Name: Lori Jones MRN: [id number] Date of Birth: [birth year]  Subjective/Objective:      54 yo female admitted with cholelithiasis              Action/Plan: Patient will be dc'd home and requesting elevated toilet seat. DME has been ordered and [facility], [facility], has been made aware of order. Staff nurse aware that toilet seat will be delivered to patient room prior to discharge.  Expected Discharge Date:                  Expected Discharge Plan:  Home/Self Care  In-House Referral:     Discharge planning Services  CM Consult  Post Acute Care Choice:  Choice offered to:  Patient  DME Arranged:   elevated toilet seat DME Agency:   [facility]  HH Arranged:  [place] Agency:   Status of Service:  Completed, signed off  Medicare Important Message Given:  No Date Medicare IM Given:    Medicare IM give by:    Date Additional Medicare IM Given:    Additional Medicare Important Message give by:     If discussed at [facility] of Stay Meetings, dates discussed:    Additional Comments:  Scot Dock, RN 09/09/2014, 10:07 AM

## 2014-09-09 NOTE — Discharge Instructions (Signed)
[facility] ______[facility] LAPAROSCOPIC SURGERY: POST OP INSTRUCTIONS Always review your discharge instruction sheet given to you by the facility where your surgery was performed. IF YOU HAVE DISABILITY OR FAMILY LEAVE FORMS, YOU MUST BRING THEM TO THE OFFICE FOR PROCESSING.   DO NOT GIVE THEM TO YOUR DOCTOR.  1. A prescription for pain medication may be given to you upon discharge.  Take your pain medication as prescribed, if needed.  If narcotic pain medicine is not needed, then you may take acetaminophen (Tylenol) or ibuprofen (Advil) as needed. 2. Take your usually prescribed medications unless otherwise directed. 3. If you need a refill on your pain medication, please contact your pharmacy.  They will contact our office to request authorization. Prescriptions will not be filled after 5pm or on week-ends. 4. You should follow a light diet the first few days after arrival home, such as soup and crackers, etc.  Be sure to include lots of fluids daily. 5. Most patients will experience some swelling and bruising in the area of the incisions.  Ice packs will help.  Swelling and bruising can take several days to resolve.  6. It is common to experience some constipation if taking pain medication after surgery.  Increasing fluid intake and taking a stool softener (such as Colace) will usually help or prevent this problem from occurring.  A mild laxative (Milk of Magnesia or Miralax) should be taken according to package instructions if there are no bowel movements after 48 hours. 7. Unless discharge instructions indicate otherwise, you may remove your bandages 24-48 hours after surgery, and you may shower at that time.  You may have steri-strips (small skin tapes) in place directly over the incision.  These strips should be left on the skin for 7-10 days.  If your surgeon used skin glue on the incision, you may shower in 24 hours.  The glue will flake off over the next 2-3 weeks.  Any sutures or  staples will be removed at the office during your follow-up visit. 8. ACTIVITIES:  You may resume regular (light) daily activities beginning the next day--such as daily self-care, walking, climbing stairs--gradually increasing activities as tolerated.  You may have sexual intercourse when it is comfortable.  Refrain from any heavy lifting or straining until approved by your doctor. a. You may drive when you are no longer taking prescription pain medication, you can comfortably wear a seatbelt, and you can safely maneuver your car and apply brakes. b. RETURN TO WORK:  __________________________________________________________ 9. You should see your doctor in the office for a follow-up appointment approximately 2-3 weeks after your surgery.  Make sure that you call for this appointment within a day or two after you arrive home to insure a convenient appointment time. 10. OTHER INSTRUCTIONS: NO LIFTING MORE THAN 15 POUNDS FOR 2 WEEKS __________________________________________________________________________________________________________________________ __________________________________________________________________________________________________________________________ WHEN TO CALL YOUR DOCTOR: 1. Fever over 101.0 2. Inability to urinate 3. Continued bleeding from incision. 4. Increased pain, redness, or drainage from the incision. 5. Increasing abdominal pain  The clinic staff is available to answer your questions during regular business hours.  Please dont hesitate to call and ask to speak to one of the nurses for clinical concerns.  If you have a medical emergency, go to the nearest emergency room or call 911.  A surgeon from [facility] is always on call at the hospital. [address], [place], [place], [place]  [address] ? [address], [place], [place]   [address] [phone])  [phone] ? [phone] ? FAX [phone] Web site: [email or web address]

## 2014-09-12 ENCOUNTER — Telehealth: Payer: Self-pay | Admitting: Family Medicine

## 2014-09-12 NOTE — Telephone Encounter (Signed)
BMET stable showing no AKI.  Lipid panel with 10year ASCVD risk 16.2%. Reports compliance with atorvastatin 40mg  daily, which she was transitioned to from simvastatin 05/2014. Recheck in 3 months and consider increased dose if still suboptimally improved (50% improvement in LDL).  Will send letter stating this and encouraging compliance, diet/exercise regularly.  [name], MD

## 2014-09-13 ENCOUNTER — Other Ambulatory Visit: Payer: Medicare Other

## 2014-09-26 ENCOUNTER — Ambulatory Visit (INDEPENDENT_AMBULATORY_CARE_PROVIDER_SITE_OTHER): Payer: Medicare Other | Admitting: Family Medicine

## 2014-09-26 ENCOUNTER — Encounter: Payer: Self-pay | Admitting: Family Medicine

## 2014-09-26 VITALS — BP 136/83 | HR 72 | Temp 98.2°F | Ht 68.5 in | Wt 186.5 lb

## 2014-09-26 DIAGNOSIS — E089 Diabetes mellitus due to underlying condition without complications: Secondary | ICD-10-CM

## 2014-09-26 DIAGNOSIS — E785 Hyperlipidemia, unspecified: Secondary | ICD-10-CM

## 2014-09-26 DIAGNOSIS — K8020 Calculus of gallbladder without cholecystitis without obstruction: Secondary | ICD-10-CM | POA: Diagnosis present

## 2014-09-26 DIAGNOSIS — E039 Hypothyroidism, unspecified: Secondary | ICD-10-CM | POA: Diagnosis not present

## 2014-09-26 LAB — TSH: TSH: 2.909 u[IU]/mL (ref 0.350–4.500)

## 2014-09-26 NOTE — Assessment & Plan Note (Signed)
Hernia repair and cholecystectomy healing well with normal exam findings (dermabond over healing port site wounds). Abdominal exam unremarkable otherwise. - Can begin to walk - Avoid heavy lifting - Monitor for postop complications though would be unexpected at this point.

## 2014-09-26 NOTE — Assessment & Plan Note (Signed)
Hypothyroidism, TSH normal 07/2013. No symptoms of thyroid dysfunction. Weight stable. -TSH today. -Continue synthroid at current dose.

## 2014-09-26 NOTE — Progress Notes (Signed)
Patient ID: KILEY SOLIMINE, female   DOB: 06-Mar-1961, 54 y.o.   MRN: [id number] Subjective:   CC: F/u hernia repair/cholecystectomy, cholesterol, thyroid  HPI:   F/u hernia repair/cholecystectomy She states she is doing well, denies any issues with repair site (repaired 6/9). Is able to eat without nausea now. No fevers/chills. Also happy with results of hernia repair. Normal stooling.  F/u cholesterol She is taking atorvastatin 40mg  daily with no issues. She is also trying to decrease chocolate intake. She has not restarted daily walking since her surgery due to uncertainty about if she could. She denies chest pain or dyspnea.   DM/HTN Stopped taking lisinopril because worries it will make hair fall out and states BP is normal.   Review of Systems - Per HPI.   SH: Not started walking yet since surgery    Objective:  Physical Exam BP 136/83 mmHg  Pulse 72  Temp(Src) 98.2 F (36.8 C) (Oral)  Ht 5' 8.5" (1.74 m)  Wt 186 lb 8 oz (84.596 kg)  BMI 27.94 kg/m2 GEN: NAD ABD: S/NT/ND, port sites (5-6) well-healing with dermabond beginning to peel off, no induration, erythema, or exudate SKIN: No rash or cyanosis NEURO: Awake, alert, no focal deficits    Assessment:     AMEL KITCH is a 54 y.o. female here for f/u hernia repair/cholecystectomy and cholesterol/DM/HTN.    Plan:     # See problem list and after visit summary for problem-specific plans.   Follow-up: Follow up in 1 month for DM eval.   [name], MD [place]

## 2014-09-26 NOTE — Assessment & Plan Note (Signed)
10 year ASCVD risk elevated at last check 2-3 months after switching to atorvastatin from simvastatin. -Continue atorvastatin daily and recheck cholesterol Sep.

## 2014-09-26 NOTE — Assessment & Plan Note (Signed)
-  Continue watching food intake. Restart daily walking, not over-exerting self. -Encouraged taking lisinopril, which she has stopped due to concern of losing her hair. Declines, but willing to bring log of BPs to next visit. -Return August for A1c recheck.

## 2014-09-29 ENCOUNTER — Encounter: Payer: Self-pay | Admitting: Family Medicine

## 2014-11-01 ENCOUNTER — Encounter: Payer: Self-pay | Admitting: Family Medicine

## 2014-11-01 ENCOUNTER — Ambulatory Visit (INDEPENDENT_AMBULATORY_CARE_PROVIDER_SITE_OTHER): Payer: Medicare Other | Admitting: Family Medicine

## 2014-11-01 VITALS — Ht 68.0 in | Wt 183.4 lb

## 2014-11-01 DIAGNOSIS — E089 Diabetes mellitus due to underlying condition without complications: Secondary | ICD-10-CM | POA: Diagnosis not present

## 2014-11-01 DIAGNOSIS — E663 Overweight: Secondary | ICD-10-CM | POA: Diagnosis not present

## 2014-11-01 NOTE — Patient Instructions (Addendum)
-   Talk with your primary care physician about blood pressure med's and about the neck pain you have when walking.   - Continue to walk at least 3 X wk and to make good food choices, as you have been doing.   - High-fat meals will likely cause discomfort now that you don't have a gall bladder.    Reminder:  Starchy (carb) foods: Bread, rice, pasta, potatoes, corn, crackers, bagels, muffins, all baked goods.  (Fruits, milk, and yogurt also have carbohydrate, but most of these foods will not spike your blood sugar as the starchy foods will.)  A few fruits do cause high blood sugars; use small portions of bananas (limit to 1/2 at a time), grapes, watermelon, and most tropical fruits.    Protein foods: Meat, fish, poultry, eggs, dairy foods, and beans such as pinto and kidney beans (beans also provide carbohydrate).   - Limit starchy foods to TWO per meal and ONE per snack. ONE portion of a starchy  food is equal to the following:   - ONE slice of bread (or its equivalent, such as half of a hamburger bun).   - 1/2 cup of a "scoopable" starchy food such as potatoes or rice.   - 15 grams of carbohydrate as shown on food label.

## 2014-11-01 NOTE — Progress Notes (Signed)
Medical Nutrition Therapy:  Appt start time: 1600 end time:  1700.  Assessment:  Primary concerns today: Weight management and Blood sugar control. (BMI >30, R48.5; DM w/out complica, I62.7)  Lori Jones just returned from a family reunion in [place], where she was conscious of making healthy choices.  In fact, she is down 3 lb since her last appt.  She has limited eating out to limit fat intake.  She has been walking early 5 X wk, with a group of women, but she will be cutting down to 3 X wk b/c she realizes she needs more sleep.  At 1.5 mi, she always starts feeling neck pain, which requires slowing down on the way back, but it has not deterred her from walking.    FBG have been running around 100 most of the time.    24-hr recall suggests intake of ~970 kcal:  (Up at 8 AM) B (9 AM)-  1 pkt inst maple brown sug oatmeal, water  100 Snk ( AM)-   L (1 PM)-  1 c red beans, 1 c beans, water   480 Snk ( PM)-  water D (8 PM)-  1 [employer] fish filet sandw, water   390 Snk ( PM)-  water Typical day? No.  Was driving back from [place] yesterday, so ate on the road.  Usually getting about 1+ veg serving per day.    Progress Towards Goal(s):  In progress.   Nutritional Diagnosis:  Continued progress noted on NI-5.8.2 Excessive carbohydrate intake As related to meals.  As evidenced by markedly reduced carb intake; limited to 2 carb portions at most meals, as recommended.    Intervention:  Nutrition education.  Handouts given during visit include:  AVS   Demonstrated degree of understanding via:  Teach Back   Monitoring/Evaluation:  Dietary intake, exercise, BG, and body weight in in October 2016  .

## 2014-11-10 ENCOUNTER — Other Ambulatory Visit: Payer: Self-pay | Admitting: Family Medicine

## 2015-01-10 ENCOUNTER — Encounter: Payer: Self-pay | Admitting: Family Medicine

## 2015-01-10 ENCOUNTER — Ambulatory Visit (INDEPENDENT_AMBULATORY_CARE_PROVIDER_SITE_OTHER): Payer: Medicare Other | Admitting: Family Medicine

## 2015-01-10 VITALS — BP 127/83 | HR 75 | Ht 68.0 in | Wt 178.9 lb

## 2015-01-10 DIAGNOSIS — E089 Diabetes mellitus due to underlying condition without complications: Secondary | ICD-10-CM

## 2015-01-10 DIAGNOSIS — E663 Overweight: Secondary | ICD-10-CM | POA: Diagnosis not present

## 2015-01-10 NOTE — Patient Instructions (Signed)
GOALS: - Reminder:  Limit starches to 2 portions per meal.    - ONE slice of bread (or its equivalent, such as half of a hamburger bun).   - 1/2 cup of a "scoopable" starchy food such as potatoes or rice.   - 15 grams of carbohydrate as shown on food label.  - Obtain twice as many veg's as protein or carbohydrate foods for both lunch and dinner.  - Remember:  TASTE PREFERENCES ARE LEARNED.  This means that it will get easier to choose foods you know are good for you if you are exposed to them enough.   - Exercise:  Walk (or other exercise) at least 40 min 4 X wk.    - Aim for consistency in when you walk.    Starchy (carb) foods: Bread, rice, pasta, potatoes, corn, cereal, grits, crackers, bagels, muffins, all baked goods.  (Fruits, milk, and yogurt also have carbohydrate, but most of these foods will not spike your blood sugar as the starchy foods will.)  A few fruits do cause high blood sugars; use small portions of bananas (limit to 1/2 at a time), grapes, watermelon, oranges, and most tropical fruits.    - Call for a January appt about the end of November:  [phone].

## 2015-01-10 NOTE — Progress Notes (Signed)
Medical Nutrition Therapy:  Appt start time: 1000 end time:  1100.  Assessment:  Primary concerns today: Weight management and Blood sugar control. (BMI >30, G92.1; DM w/out complica, J94.1)  [name] is concerned that she is losing too much weight - mostly b/c of comments from friends - so she has started cutting back on her early morning walking.  She rode her bike 2 miles last week some, but has usually been walking ~40 min 3 times a week instead of the daily walking she was doing.  [name] did a 21-day fast (avoided juice, but could have protein shakes, no bread, snacks other than fruit, or sweets, or beef, but could have chx and fish after 3 PM) for church, which ended Sunday.  This was probably a driving force behind the [employer] meal she had yesterday.    We talked about all the reasons it would be good to continue to get exercise on most days of the week, irrespective of weight concerns.    FBG have been running around 80-105.    24-hr recall suggests intake of ~1120 kcal:  (Up at 7:30 AM) B (8:45 AM)-  1 pkt instant maple brn sugar oatmeal, 1 toast, 1 egg, water 320 Snk ( AM)-  water L ( PM)-  water Snk (2:30)-  1 c Gatorade D (5:30 PM)-  McD fish filet sandw, med ff's, ice cream, water   800 Snk ( PM)-  water Typical day? No.  Usually has not been eating bread or ff's recently.    Progress Towards Goal(s):  In progress.     Nutritional Diagnosis:  Continued progress noted on NI-5.8.2 Excessive carbohydrate intake As related to meals.  As evidenced by markedly reduced carb intake; limited to 2 carb portions at most meals, as recommended.    Intervention:  Nutrition education.  Handouts given during visit include:  AVS   Demonstrated degree of understanding via:  Teach Back   Monitoring/Evaluation:  Dietary intake, exercise, BG, and body weight in in January 2017  .  Patient will call for appt.

## 2015-01-23 ENCOUNTER — Ambulatory Visit: Payer: Medicare Other | Admitting: Family Medicine

## 2015-02-08 ENCOUNTER — Encounter: Payer: Self-pay | Admitting: Family Medicine

## 2015-02-08 ENCOUNTER — Ambulatory Visit (INDEPENDENT_AMBULATORY_CARE_PROVIDER_SITE_OTHER): Payer: Medicare Other | Admitting: Family Medicine

## 2015-02-08 VITALS — BP 122/75 | HR 67 | Temp 97.9°F | Ht 68.0 in | Wt 176.0 lb

## 2015-02-08 DIAGNOSIS — M549 Dorsalgia, unspecified: Secondary | ICD-10-CM | POA: Insufficient documentation

## 2015-02-08 DIAGNOSIS — E785 Hyperlipidemia, unspecified: Secondary | ICD-10-CM | POA: Diagnosis not present

## 2015-02-08 DIAGNOSIS — F259 Schizoaffective disorder, unspecified: Secondary | ICD-10-CM | POA: Diagnosis not present

## 2015-02-08 DIAGNOSIS — E089 Diabetes mellitus due to underlying condition without complications: Secondary | ICD-10-CM | POA: Diagnosis not present

## 2015-02-08 LAB — POCT GLYCOSYLATED HEMOGLOBIN (HGB A1C): HEMOGLOBIN A1C: 6

## 2015-02-08 MED ORDER — ESCITALOPRAM OXALATE 10 MG PO TABS: 10.0000 mg | ORAL_TABLET | Freq: Every day | ORAL | Status: DC

## 2015-02-08 MED ORDER — GLUCOSE BLOOD VI STRP: ORAL_STRIP | Status: DC

## 2015-02-08 MED ORDER — ATORVASTATIN CALCIUM 40 MG PO TABS: 40.0000 mg | ORAL_TABLET | Freq: Every day | ORAL | Status: DC

## 2015-02-08 NOTE — Progress Notes (Signed)
Subjective:    Patient ID: Lori Jones , female   DOB: [birth year] , 54 y.o..   MRN: [id number]  HPI  Lori Jones is here for a routine visit.  Diabetes:  Medications: Metformin, Tradjenta Medication compliance: compliant all of the time.   ROS: No polyuria, polydipsia, polyphagia, change in vision, numbness/tingling in extremities Diet/lifestyle modification: Patient has been working with Dr. Jenne Campus (nutritionist). She has been losing weight and eating healthier.  Blood sugar control: Checks fasting glucose in the morning; usually 95-102 Kidneys: Not on ACE because she thinks it causes hair loss Feet: Last exam was in June 2016 Eyes: Last eye exam was in March or April 2016  Schizoaffective Disorder:  Patient states that her mood is good. She denies any depression at this time. She does admit to feeling anxious at times when in a large group but she is able to calm herself down by closing her eyes and breathing. She has joined a new church which she loves and has provided much support. Of note, she has not taken her Prozac for about 2 months because her prescription ran out.   Hyperlipidemia: Chest pain on exertion: no   Leg claudication: no Medications (modifying factor): Lipitor Compliance- excellent  Right upper quadrant pain- no  Muscle aches- no Duration - Diagnosed last year in July of 2015 (a little over a year ago)   Review of Systems: Per HPI. All other systems reviewed and are negative.  Health Maintenance Due  Topic Date Due  . Hepatitis C Screening  [birth year]  . PNEUMOCOCCAL POLYSACCHARIDE VACCINE (1) 09/02/1961  . OPHTHALMOLOGY EXAM  09/02/1969  . URINE MICROALBUMIN  09/02/1969  . HIV Screening  09/03/1974  . TETANUS/TDAP  09/03/1978  . PAP SMEAR  09/02/1980  . MAMMOGRAM  09/02/2009  . COLONOSCOPY  09/02/2009  Pap smear scheduled for next month as well as mammogram  Not interested in flu shot  Past Medical History: Patient Active Problem List   Diagnosis Date Noted  . Mass of neck 08/30/2014  . Ventral hernia 08/14/2014  . Left wrist pain 12/17/2013  . Abdominal pain 11/05/2013  . HLD (hyperlipidemia) 10/06/2013  . Hypothyroidism 08/06/2013  . Schizoaffective disorder, unspecified type ([place]) 07/10/2013  . Diabetes mellitus due to underlying condition without complications ([place]) 00/17/4944  . Heart murmur 07/10/2013    Medications: reviewed and updated Current Outpatient Prescriptions  Medication Sig Dispense Refill  . ACCU-CHEK AVIVA PLUS test strip USE AS DIRECTED TWICE DAILY 100 each 3  . aspirin 81 MG tablet Take 81 mg by mouth daily.    Marland Kitchen atorvastatin (LIPITOR) 40 MG tablet Take 1 tablet (40 mg total) by mouth daily. 90 tablet 3  . Biotin 10 MG CAPS Take 2 capsules by mouth 2 (two) times daily.     Marland Kitchen escitalopram (LEXAPRO) 10 MG tablet Take 1 tablet (10 mg total) by mouth daily. 90 tablet 1  . levothyroxine (SYNTHROID, LEVOTHROID) 25 MCG tablet TAKE 1 TABLET BY MOUTH DAILY BEFORE BREAKFAST.(OVERDUE FOR THYROID FOLLOW UP) 90 tablet 0  . linagliptin (TRADJENTA) 5 MG TABS tablet Take 1 tablet (5 mg total) by mouth daily. 90 tablet 1  . lisinopril (PRINIVIL,ZESTRIL) 2.5 MG tablet Take 1 tablet (2.5 mg total) by mouth daily. 90 tablet 1  . metFORMIN (GLUCOPHAGE) 1000 MG tablet Take 1 tablet (1,000 mg total) by mouth 2 (two) times daily with a meal. 180 tablet 5  . traMADol (ULTRAM) 50 MG tablet Take 50 mg by mouth every  6 (six) hours as needed for pain.      No current facility-administered medications for this visit.    SHx:    reports that she has never smoked. She has never used smokeless tobacco.    Objective:   BP 122/75 mmHg  Pulse 67  Temp(Src) 97.9 F (36.6 C) (Oral)  Ht 5\' 8"  (1.727 m)  Wt 176 lb (79.833 kg)  BMI 26.77 kg/m2 Physical Exam  Gen: NAD, alert, cooperative with exam, well-appearing.  HEENT: NCAT, PERRL, clear conjunctiva, oropharynx clear, supple neck CV: RRR, good D3/O6, systolic murmur  present, no edema, capillary refill brisk  Resp: CTABL, no wheezes, non-labored Abd: Soft, non-tender, non-distended, BS present, no guarding or organomegaly Skin: no rashes, normal turgor  Psych: good insight, alert and oriented. No visual or auditory hallucinations  Diabetic Foot Exam - Simple   Simple Foot Form  Diabetic Foot exam was performed with the following findings:  Yes 02/08/2015  3:11 PM  Visual Inspection  No deformities, no ulcerations, no other skin breakdown bilaterally:  Yes  Sensation Testing  Intact to touch and monofilament testing bilaterally:  Yes  Pulse Check  Posterior Tibialis and Dorsalis pulse intact bilaterally:  Yes  Comments      Assessment & Plan:  Diabetes mellitus due to underlying condition without complications Controlled. Hgb A1c 6 today. Last eye exam in March or April of 2016 according to patient. Foot exam today normal. She has also lost 10 lbs since June 2016.  - Continue to follow with Dr. Jenne Campus for nutrition  - Continue Metformin and Tradjenta - Encouraged patient to take ACE inhibitor for renal protection, she declined - Will get microalbuminuria study today - Check A1C in 3 months  Schizoaffective disorder, unspecified type No visual or auditory hallucinations. No depression at this time. Some feelings of anxiety controlled with deep breathing. Has been off Lexapro for about 2 months because prescription ran out.  - Restart Lexapro  - Patient will continue to go to her church group which provides spiritual and emotional support   HLD (hyperlipidemia) Stable. Last lipid panel was in June 2016. LDL and cholesterol in June were normal. HDL on the lower side (34).  - Continue Lipitor, will refill medication today

## 2015-02-08 NOTE — Assessment & Plan Note (Signed)
Stable. Last lipid panel was in June 2016. LDL and cholesterol in June were normal. HDL on the lower side (34).  - Continue Lipitor, will refill medication today

## 2015-02-08 NOTE — Assessment & Plan Note (Signed)
Controlled. Hgb A1c 6 today. Last eye exam in March or April of 2016 according to patient. Foot exam today normal. She has also lost 10 lbs since June 2016.  - Continue to follow with Dr. Jenne Campus for nutrition  - Continue Metformin and Tradjenta - Encouraged patient to take ACE inhibitor for renal protection, she declined - Will get microalbuminuria study today - Check A1C in 3 months

## 2015-02-08 NOTE — Patient Instructions (Addendum)
Thank you for coming in today, it was so nice meeting you!  Today we discussed your diabetes and health maintence. I would like to see you in 3 months or sooner if needed. I have sent refill medications to your pharmacy, if you need anymore refills, please call the clinic. I have also put in a lab order for you to get your urine checked to make sure your kidneys are ok.   If you have any questions or concerns, please do not hesitate to call the office at [phone].  Sincerely,  Smitty Cords, MD

## 2015-02-08 NOTE — Assessment & Plan Note (Signed)
No visual or auditory hallucinations. No depression at this time. Some feelings of anxiety controlled with deep breathing. Has been off Lexapro for about 2 months because prescription ran out.  - Restart Lexapro  - Patient will continue to go to her church group which provides spiritual and emotional support

## 2015-02-09 LAB — MICROALBUMIN / CREATININE URINE RATIO
CREATININE, URINE: 291 mg/dL (ref 20–320)
Microalb Creat Ratio: 26 mcg/mg creat (ref <30)
Microalb, Ur: 7.6 mg/dL

## 2015-02-13 ENCOUNTER — Other Ambulatory Visit: Payer: Self-pay | Admitting: Family Medicine

## 2015-02-14 ENCOUNTER — Encounter: Payer: Self-pay | Admitting: Family Medicine

## 2015-03-02 DIAGNOSIS — Z124 Encounter for screening for malignant neoplasm of cervix: Secondary | ICD-10-CM | POA: Diagnosis not present

## 2015-03-02 DIAGNOSIS — Z6827 Body mass index (BMI) 27.0-27.9, adult: Secondary | ICD-10-CM | POA: Diagnosis not present

## 2015-03-07 DIAGNOSIS — Z1231 Encounter for screening mammogram for malignant neoplasm of breast: Secondary | ICD-10-CM | POA: Diagnosis not present

## 2015-03-11 ENCOUNTER — Other Ambulatory Visit: Payer: Self-pay | Admitting: Family Medicine

## 2015-03-17 ENCOUNTER — Other Ambulatory Visit: Payer: Self-pay | Admitting: Family Medicine

## 2015-04-03 ENCOUNTER — Other Ambulatory Visit: Payer: Self-pay | Admitting: Family Medicine

## 2015-04-05 MED ORDER — LINAGLIPTIN 5 MG PO TABS: ORAL_TABLET | ORAL | Status: DC

## 2015-04-18 DIAGNOSIS — N958 Other specified menopausal and perimenopausal disorders: Secondary | ICD-10-CM | POA: Diagnosis not present

## 2015-04-18 DIAGNOSIS — M8588 Other specified disorders of bone density and structure, other site: Secondary | ICD-10-CM | POA: Diagnosis not present

## 2015-05-12 DIAGNOSIS — S40861A Insect bite (nonvenomous) of right upper arm, initial encounter: Secondary | ICD-10-CM | POA: Diagnosis not present

## 2015-05-12 DIAGNOSIS — S40862A Insect bite (nonvenomous) of left upper arm, initial encounter: Secondary | ICD-10-CM | POA: Diagnosis not present

## 2015-05-12 DIAGNOSIS — W57XXXA Bitten or stung by nonvenomous insect and other nonvenomous arthropods, initial encounter: Secondary | ICD-10-CM | POA: Diagnosis not present

## 2015-05-12 DIAGNOSIS — S1096XA Insect bite of unspecified part of neck, initial encounter: Secondary | ICD-10-CM | POA: Diagnosis not present

## 2015-05-18 ENCOUNTER — Other Ambulatory Visit: Payer: Self-pay | Admitting: Family Medicine

## 2015-06-13 DIAGNOSIS — E119 Type 2 diabetes mellitus without complications: Secondary | ICD-10-CM | POA: Diagnosis not present

## 2015-06-13 DIAGNOSIS — H401131 Primary open-angle glaucoma, bilateral, mild stage: Secondary | ICD-10-CM | POA: Diagnosis not present

## 2015-06-13 DIAGNOSIS — H25813 Combined forms of age-related cataract, bilateral: Secondary | ICD-10-CM | POA: Diagnosis not present

## 2015-06-13 LAB — HM DIABETES EYE EXAM

## 2015-06-22 ENCOUNTER — Emergency Department (HOSPITAL_COMMUNITY)
Admission: EM | Admit: 2015-06-22 | Discharge: 2015-06-22 | Disposition: A | Discharge status: Home / Self Care | Payer: Medicare Other | Attending: Emergency Medicine | Admitting: Emergency Medicine

## 2015-06-22 ENCOUNTER — Encounter (HOSPITAL_COMMUNITY): Payer: Self-pay | Admitting: Emergency Medicine

## 2015-06-22 ENCOUNTER — Emergency Department (HOSPITAL_COMMUNITY): Payer: Medicare Other

## 2015-06-22 DIAGNOSIS — Z79899 Other long term (current) drug therapy: Secondary | ICD-10-CM | POA: Insufficient documentation

## 2015-06-22 DIAGNOSIS — M1990 Unspecified osteoarthritis, unspecified site: Secondary | ICD-10-CM | POA: Diagnosis not present

## 2015-06-22 DIAGNOSIS — I209 Angina pectoris, unspecified: Secondary | ICD-10-CM | POA: Insufficient documentation

## 2015-06-22 DIAGNOSIS — J45909 Unspecified asthma, uncomplicated: Secondary | ICD-10-CM | POA: Insufficient documentation

## 2015-06-22 DIAGNOSIS — F419 Anxiety disorder, unspecified: Secondary | ICD-10-CM | POA: Diagnosis not present

## 2015-06-22 DIAGNOSIS — G8929 Other chronic pain: Secondary | ICD-10-CM | POA: Diagnosis not present

## 2015-06-22 DIAGNOSIS — M419 Scoliosis, unspecified: Secondary | ICD-10-CM | POA: Diagnosis not present

## 2015-06-22 DIAGNOSIS — S99922A Unspecified injury of left foot, initial encounter: Secondary | ICD-10-CM | POA: Diagnosis not present

## 2015-06-22 DIAGNOSIS — Y998 Other external cause status: Secondary | ICD-10-CM | POA: Insufficient documentation

## 2015-06-22 DIAGNOSIS — Y9389 Activity, other specified: Secondary | ICD-10-CM | POA: Insufficient documentation

## 2015-06-22 DIAGNOSIS — S91332A Puncture wound without foreign body, left foot, initial encounter: Secondary | ICD-10-CM | POA: Insufficient documentation

## 2015-06-22 DIAGNOSIS — Z23 Encounter for immunization: Secondary | ICD-10-CM | POA: Diagnosis not present

## 2015-06-22 DIAGNOSIS — E7800 Pure hypercholesterolemia, unspecified: Secondary | ICD-10-CM | POA: Insufficient documentation

## 2015-06-22 DIAGNOSIS — Y9289 Other specified places as the place of occurrence of the external cause: Secondary | ICD-10-CM | POA: Insufficient documentation

## 2015-06-22 DIAGNOSIS — E039 Hypothyroidism, unspecified: Secondary | ICD-10-CM | POA: Insufficient documentation

## 2015-06-22 DIAGNOSIS — Z8673 Personal history of transient ischemic attack (TIA), and cerebral infarction without residual deficits: Secondary | ICD-10-CM | POA: Insufficient documentation

## 2015-06-22 DIAGNOSIS — Z7984 Long term (current) use of oral hypoglycemic drugs: Secondary | ICD-10-CM | POA: Insufficient documentation

## 2015-06-22 DIAGNOSIS — Z8742 Personal history of other diseases of the female genital tract: Secondary | ICD-10-CM | POA: Diagnosis not present

## 2015-06-22 DIAGNOSIS — Z7982 Long term (current) use of aspirin: Secondary | ICD-10-CM | POA: Insufficient documentation

## 2015-06-22 DIAGNOSIS — E119 Type 2 diabetes mellitus without complications: Secondary | ICD-10-CM | POA: Diagnosis not present

## 2015-06-22 DIAGNOSIS — I10 Essential (primary) hypertension: Secondary | ICD-10-CM | POA: Diagnosis not present

## 2015-06-22 DIAGNOSIS — R011 Cardiac murmur, unspecified: Secondary | ICD-10-CM | POA: Insufficient documentation

## 2015-06-22 DIAGNOSIS — Z862 Personal history of diseases of the blood and blood-forming organs and certain disorders involving the immune mechanism: Secondary | ICD-10-CM | POA: Insufficient documentation

## 2015-06-22 DIAGNOSIS — F329 Major depressive disorder, single episode, unspecified: Secondary | ICD-10-CM | POA: Diagnosis not present

## 2015-06-22 DIAGNOSIS — W450XXA Nail entering through skin, initial encounter: Secondary | ICD-10-CM | POA: Insufficient documentation

## 2015-06-22 MED ORDER — NAPROXEN 250 MG PO TABS
500.0000 mg | ORAL_TABLET | Freq: Once | ORAL | Status: AC
  Administered 2015-06-22: 500 mg via ORAL
  Filled 2015-06-22: qty 2

## 2015-06-22 MED ORDER — TETANUS-DIPHTH-ACELL PERTUSSIS 5-2.5-18.5 LF-MCG/0.5 IM SUSP
0.5000 mL | Freq: Once | INTRAMUSCULAR | Status: AC
  Administered 2015-06-22: 0.5 mL via INTRAMUSCULAR
  Filled 2015-06-22: qty 0.5

## 2015-06-22 MED ORDER — LEVOFLOXACIN 750 MG PO TABS
750.0000 mg | ORAL_TABLET | Freq: Once | ORAL | Status: AC
  Administered 2015-06-22: 750 mg via ORAL
  Filled 2015-06-22: qty 1

## 2015-06-22 MED ORDER — LEVOFLOXACIN 750 MG PO TABS: 750.0000 mg | ORAL_TABLET | Freq: Every day | ORAL | Status: DC

## 2015-06-22 MED ORDER — NAPROXEN 500 MG PO TABS: 500.0000 mg | ORAL_TABLET | Freq: Two times a day (BID) | ORAL | Status: DC

## 2015-06-22 NOTE — ED Provider Notes (Signed)
CSN: [id number]     Arrival date & time 06/22/15  1825 History  By signing my name below, I, Hansel Feinstein, attest that this documentation has been prepared under the direction and in the presence of Markeda Narvaez Y Haward Pope, [place]. Electronically Signed: Hansel Feinstein, ED Scribe. 06/22/2015. 7:16 PM.     Chief Complaint  Patient presents with  . Foot Injury   The history is provided by the patient. No language interpreter was used.   HPI Comments: Lori Jones is a 55 y.o. female with h/o DM, HTN who presents to the Emergency Department complaining of a puncture wound with controlled bleeding to the sole of her left foot that occurred at 5 PM. Pt states she stepped on a nail in the woods while hiking and it went through her boot and punctured her foot.  Pt states associated 2/10 pain. Pt denies taking OTC medications at home to improve symptoms. Pt takes qd ASA. Tdap out of date. She denies numbness, additional injuries.   Past Medical History  Diagnosis Date  . Diabetes mellitus   . Hypothyroid   . Hypercholesteremia   . Back pain, chronic   . Scoliosis   . Herniated disc   . Allergy   . Anemia   . Anxiety   . Arthritis   . Heart murmur   . Hypertension   . Anginal pain (HCC)     hx of CPs  . Stroke [facility])     hx of sun stroke while in college   . Asthma     hx of in childhood   . Depression   . Kidney disease     "pelvic kidney" bilat   . Seizures ([place])     hx of in childhood and again in college    Past Surgical History  Procedure Laterality Date  . Abdominal hysterectomy    . Cesarean section    . Hand surgery      dog bite  . Thumb surgery    . Cholecystectomy N/A 09/08/2014    Procedure: LAPAROSCOPIC CHOLECYSTECTOMY;  Surgeon: Coralie Keens, MD;  Location: [facility];  Service: General;  Laterality: N/A;  . Ventral hernia repair N/A 09/08/2014    Procedure: VENTRAL HERNIA REPAIR;  Surgeon: Coralie Keens, MD;  Location: [facility];  Service: General;  Laterality: N/A;   Family  History  Problem Relation Age of Onset  . Cancer Mother   . Depression Mother   . Diabetes Mother   . Heart disease Mother 52    Stents  . Hypertension Mother   . Hyperlipidemia Mother   . Thyroid disease Mother   . Cancer Father   . Sickle cell anemia Brother    Social History  Substance Use Topics  . Smoking status: Never Smoker   . Smokeless tobacco: Never Used  . Alcohol Use: No   OB History    No data available     Review of Systems  Skin: Positive for wound.  Neurological: Negative for numbness.  All other systems reviewed and are negative.  Allergies  [name]; Imitrex; and Canagliflozin  Home Medications   Prior to Admission medications   Medication Sig Start Date End Date Taking? Authorizing Provider  aspirin 81 MG tablet Take 81 mg by mouth daily.    Historical Provider, MD  atorvastatin (LIPITOR) 40 MG tablet Take 1 tablet (40 mg total) by mouth daily. 02/08/15   Carlyle Dolly, MD  Biotin 10 MG CAPS Take 2 capsules by mouth  2 (two) times daily.     Historical Provider, MD  escitalopram (LEXAPRO) 10 MG tablet Take 1 tablet (10 mg total) by mouth daily. 02/08/15   Carlyle Dolly, MD  glucose blood (ACCU-CHEK AVIVA PLUS) test strip Use in the morning to check fasting glucose 02/08/15   Carlyle Dolly, MD  levothyroxine (SYNTHROID, LEVOTHROID) 25 MCG tablet TAKE 1 TABLET BY MOUTH EVERY DAY BEFORE BREAKFAST, OVERDUE FOR THYROID FOLLOW UP 05/18/15   Carlyle Dolly, MD  linagliptin (TRADJENTA) 5 MG TABS tablet TAKE 1 TABLET(5 MG) BY MOUTH DAILY 04/05/15   Carlyle Dolly, MD  lisinopril (PRINIVIL,ZESTRIL) 2.5 MG tablet Take 1 tablet (2.5 mg total) by mouth daily. Patient not taking: Reported on 02/08/2015 08/30/14   [name], MD  metFORMIN (GLUCOPHAGE) 1000 MG tablet Take 1 tablet (1,000 mg total) by mouth 2 (two) times daily with a meal. 06/07/14   [name], MD  traMADol (ULTRAM) 50 MG tablet Take 50 mg by mouth every 6 (six)  hours as needed for pain.     Historical Provider, MD   BP 112/74 mmHg  Pulse 73  Temp(Src) 98 F (36.7 C)  Resp 18  SpO2 97% Physical Exam  Constitutional: She is oriented to person, place, and time. She appears well-developed and well-nourished.  HENT:  Head: Normocephalic and atraumatic.  Eyes: Conjunctivae and EOM are normal. Pupils are equal, round, and reactive to light.  Neck: Normal range of motion. Neck supple.  Cardiovascular: Normal rate.   Pulmonary/Chest: Effort normal. No respiratory distress.  Abdominal: She exhibits no distension.  Musculoskeletal: Normal range of motion.       Feet:  Neurological: She is alert and oriented to person, place, and time.  Skin: Skin is warm and dry.  Psychiatric: She has a normal mood and affect. Her behavior is normal.  Nursing note and vitals reviewed.   ED Course  Procedures (including critical care time) DIAGNOSTIC STUDIES: Oxygen Saturation is 97% on RA, normal by my interpretation.    COORDINATION OF CARE: 7:13 PM Discussed treatment plan with pt at bedside which includes XR antibiotics and pt agreed to plan.   Imaging Review Dg Foot Complete Left  06/22/2015  CLINICAL DATA:  Stepped on a nail today entering near the base of the fourth metatarsal. Prior injuries to the left foot. Initial encounter. EXAM: LEFT FOOT - COMPLETE 3+ VIEW COMPARISON:  02/18/2009 FINDINGS: No acute fracture, dislocation, or radiopaque foreign body is identified. Bone mineralization appears normal. A small plantar calcaneal enthesophyte is unchanged. IMPRESSION: No acute osseous abnormality or radiopaque foreign body identified. Electronically Signed   By: Logan Bores M.D.   On: 06/22/2015 19:55   I have personally reviewed and evaluated these images as part of my medical decision-making.   MDM   Final diagnoses:  Puncture wound of foot, left, initial encounter    Will obtain X-ray to r/o bony involvement. Exam is reassuring. Naproxen and  Tdap ordered. Will give first dose levaquin here with rx for four more days to cover for pseudomonas.   X-ray given. Area was thoroughly irrigated and dressed by myself. Pt sees Dr. Caralyn Guile for ortho. Instructed to f/u with him in clinic within one week. Rx given for four more days of levaquin. Naproxen for pain. ER return precautions given.  I personally performed the services described in this documentation, which was scribed in my presence. The recorded information has been reviewed and is accurate.   Anne Ng, PA-C  06/22/15 2057  Julianne Rice, MD 06/22/15 2223

## 2015-06-22 NOTE — ED Notes (Signed)
Pt reports she stepped on a nail around 5pm. The nail was sticking out of a tree stump and went through her shoe into the sole of her left foot. No active bleeding. Pt reports she is not up to date on her tetanus.

## 2015-06-22 NOTE — ED Notes (Signed)
PT will need a tetanus

## 2015-06-22 NOTE — ED Notes (Signed)
Patient transported to X-ray 

## 2015-06-22 NOTE — Discharge Instructions (Signed)
Your x-ray was normal. Take antibiotics as prescribed. You may take naproxen as needed for pain. Please call Dr. Angus Palms office for further evaluation as needed. Return to the ER for new or worsening symptoms.   Puncture Wound A puncture wound is an injury that is caused by a sharp, thin object that goes through (penetrates) your skin. Usually, a puncture wound does not leave a large opening in your skin, so it may not bleed a lot. However, when you get a puncture wound, dirt or other materials (foreign bodies) can be forced into your wound and break off inside. This increases the chance of infection, such as tetanus. CAUSES Puncture wounds are caused by any sharp, thin object that goes through your skin, such as:  Animal teeth, as with an animal bite.  Sharp, pointed objects, such as nails, splinters of glass, fishhooks, and needles. SYMPTOMS Symptoms of a puncture wound include:  Pain.  Bleeding.  Swelling.  Bruising.  Fluid leaking from the wound.  Numbness, tingling, or loss of function. DIAGNOSIS This condition is diagnosed with a medical history and physical exam. Your wound will be checked to see if it contains any foreign bodies. You may also have X-rays or other imaging tests. TREATMENT Treatment for a puncture wound depends on how serious the wound is. It also depends on whether the wound contains any foreign bodies. Treatment for all types of puncture wounds usually starts with:  Controlling the bleeding.  Washing out the wound with a germ-free (sterile) salt-water solution.  Checking the wound for foreign bodies. Treatment may also include:  Having the wound opened surgically to remove a foreign object.  Closing the wound with stitches (sutures) if it continues to bleed.  Covering the wound with antibiotic ointments and a bandage (dressing).  Receiving a tetanus shot.  Receiving a rabies vaccine. HOME CARE INSTRUCTIONS Medicines  Take or apply  over-the-counter and prescription medicines only as told by your health care provider.  If you were prescribed an antibiotic, take or apply it as told by your health care provider. Do not stop using the antibiotic even if your condition improves. Wound Care  There are many ways to close and cover a wound. For example, a wound can be covered with sutures, skin glue, or adhesive strips. Follow instructions from your health care provider about:  How to take care of your wound.  When and how you should change your dressing.  When you should remove your dressing.  Removing whatever was used to close your wound.  Keep the dressing dry as told by your health care provider. Do not take baths, swim, use a hot tub, or do anything that would put your wound underwater until your health care provider approves.  Clean the wound as told by your health care provider.  Do not scratch or pick at the wound.  Check your wound every day for signs of infection. Watch for:  Redness, swelling, or pain.  Fluid, blood, or pus. General Instructions  Raise (elevate) the injured area above the level of your heart while you are sitting or lying down.  If your puncture wound is in your foot, ask your health care provider if you need to avoid putting weight on your foot and for how long.  Keep all follow-up visits as told by your health care provider. This is important. SEEK MEDICAL CARE IF:  You received a tetanus shot and you have swelling, severe pain, redness, or bleeding at the injection site.  You  have a fever.  Your sutures come out.  You notice a bad smell coming from your wound or your dressing.  You notice something coming out of your wound, such as wood or glass.  Your pain is not controlled with medicine.  You have increased redness, swelling, or pain at the site of your wound.  You have fluid, blood, or pus coming from your wound.  You notice a change in the color of your skin near  your wound.  You need to change the dressing frequently due to fluid, blood, or pus draining from your wound.  You develop a new rash.  You develop numbness around your wound. SEEK IMMEDIATE MEDICAL CARE IF:  You develop severe swelling around your wound.  Your pain suddenly increases and is severe.  You develop painful skin lumps.  You have a red streak going away from your wound.  The wound is on your hand or foot and you cannot properly move a finger or toe.  The wound is on your hand or foot and you notice that your fingers or toes look pale or bluish.   This information is not intended to replace advice given to you by your health care provider. Make sure you discuss any questions you have with your health care provider.   Document Released: 12/26/2004 Document Revised: 12/07/2014 Document Reviewed: 05/11/2014 Elsevier Interactive Patient Education [employer].

## 2015-06-29 ENCOUNTER — Other Ambulatory Visit: Payer: Self-pay | Admitting: *Deleted

## 2015-06-29 MED ORDER — LINAGLIPTIN 5 MG PO TABS: ORAL_TABLET | ORAL | Status: DC

## 2015-06-29 NOTE — Telephone Encounter (Signed)
Called patient to inform her that she is overdue for a diabetic visit. Patient agreed to call the clinic and schedule an appointment ASAP. Will give 30 day supply of Tradjenta.

## 2015-07-07 ENCOUNTER — Encounter: Payer: Self-pay | Admitting: Family Medicine

## 2015-07-07 ENCOUNTER — Ambulatory Visit (INDEPENDENT_AMBULATORY_CARE_PROVIDER_SITE_OTHER): Payer: Medicare Other | Admitting: Family Medicine

## 2015-07-07 DIAGNOSIS — E089 Diabetes mellitus due to underlying condition without complications: Secondary | ICD-10-CM | POA: Diagnosis present

## 2015-07-07 LAB — POCT GLYCOSYLATED HEMOGLOBIN (HGB A1C): HEMOGLOBIN A1C: 5.8

## 2015-07-07 MED ORDER — ESCITALOPRAM OXALATE 10 MG PO TABS: 10.0000 mg | ORAL_TABLET | Freq: Every day | ORAL | Status: DC

## 2015-07-07 NOTE — Progress Notes (Signed)
   Subjective:    Patient ID: Lori Jones , female   DOB: 1961/02/07 , 55 y.o..   MRN: [id number]  HPI  Lori Jones is here for diabetes follow up.  CHRONIC DIABETES follow up  Disease Monitoring  Blood Sugar Ranges: Checks glucose daily and  says it is usually not over 100 in the mornings Polyuria: no   Visual problems: no   Medication Compliance: yes, very compliant Medication Side Effects: No. Takes Tradjenta 5 mg daily and Metformin 1000 mg BID Hypoglycemia symptoms: No   Preventitive Health Care  Foot Exam: performed on Nov 2016 and WNL   Diet pattern: Has seen the nutritionist (Dr. Jenne Campus) and has made significant improvement in her nutrition although has not seen her in awhile (since Oct 2016). Has an appointment coming up with Dr. Jenne Campus next week. Admits to trying to eat less carbohydrates.  Exercise: Walks 3-4 times a week  Review of Systems: Per HPI. All other systems reviewed and are negative.  Medications: reviewed and updated  Social Hx:   reports that she has never smoked. She has never used smokeless tobacco.    Objective:   BP 129/77 mmHg  Pulse 74  Temp(Src) 98 F (36.7 C) (Oral)  Wt 172 lb 1.6 oz (78.064 kg) Physical Exam  Gen: NAD, alert, cooperative with exam, well-appearing CV: RRR, good S1/S2, no murmur, no edema, capillary refill brisk  Resp: CTABL, no wheezes, non-labored Psych: good insight, normal mood and affect    Assessment & Plan:  Diabetes mellitus due to underlying condition without complications Controlled. Hgb A1C is down to 5.8 (techincally pre-diabetic now by definition). Has lost 4 lbs since November 2016.  - Discontinue Tradjenta today - Continue Metformin - Follow up with Dr. Jenne Campus for nutrition - Continued to encourage exercise at least 3 days/week - Explained to patient she no longer needs to check her glucose - Follow up in 3 months

## 2015-07-07 NOTE — Patient Instructions (Addendum)
Thank you for coming in today it was so nice to see you!  Congratulations are bringing your A1C down! Keep exercising and eating less sugar. You do not need to check your sugar anymore. You can stop taking Tradjenta :)  Hemoglobin A1C; 5.8  I would like to follow up with you in 3 months.  If you have any questions or concerns, please do not hesitate to call the office at [phone].  Sincerely,  Smitty Cords, MD

## 2015-07-08 ENCOUNTER — Encounter: Payer: Self-pay | Admitting: Family Medicine

## 2015-07-08 NOTE — Assessment & Plan Note (Signed)
Controlled. Hgb A1C is down to 5.8 (techincally pre-diabetic now by definition). Has lost 4 lbs since November 2016.  - Discontinue Tradjenta today - Continue Metformin - Follow up with Dr. Jenne Campus for nutrition - Continued to encourage exercise at least 3 days/week - Explained to patient she no longer needs to check her glucose - Follow up in 3 months

## 2015-07-10 ENCOUNTER — Encounter: Payer: Self-pay | Admitting: Family Medicine

## 2015-07-10 ENCOUNTER — Ambulatory Visit (INDEPENDENT_AMBULATORY_CARE_PROVIDER_SITE_OTHER): Payer: Medicare Other | Admitting: Family Medicine

## 2015-07-10 VITALS — Ht 68.0 in | Wt 174.7 lb

## 2015-07-10 DIAGNOSIS — E089 Diabetes mellitus due to underlying condition without complications: Secondary | ICD-10-CM

## 2015-07-10 DIAGNOSIS — E663 Overweight: Secondary | ICD-10-CM | POA: Diagnosis not present

## 2015-07-10 DIAGNOSIS — Z794 Long term (current) use of insulin: Secondary | ICD-10-CM

## 2015-07-10 NOTE — Patient Instructions (Signed)
Food Goals: 1. Include a vegetable at both lunch and dinner.    - Ideally, vegetables will be twice the volume of protein or starch foods.    - Consider for some meals:  An entree salad with some source of protein.  You can add some crackers/roll for some starch.     - Salads:  Work on incorporating more veg's to your lettuce, e.g., carrots, bell peppers, celery, cucumber (peeled), broccoli, any of the dark leafy greens.    - The greater variety of veg's you like, the easier it will be to meet your nutritional needs with a good veg intake.     - When learning to like new veg's, start small:  Use small amounts, cut into small pieces.     - Many people like roasted veg's (for example, Brussels sprouts, asparagus, beets) who would not eat those same veg's otherwise.  (Roast veg's sprayed with oil in a pan at ~400 degrees.  Check frequently so they don't burn.   2. Continue to keep starch/carb foods limited to no more than 2 portions each meal.  One portion of a carb food = 1 slice of bread or its equivalent; 1/2 cup of a scoopable carb food (rice, potatoes, pasta, corn); each 15 grams of total carb listed on the food label.   3. Exercise goal:  60 min walking 5 X wk AND 30 min bicycling at least twice a week.    - Document minutes of exercise on the kitchen calendar ([employer]!).    - Call for Nutrition follow-up (as needed) after your next appt with Dr. Juanito Doom.

## 2015-07-10 NOTE — Progress Notes (Signed)
Medical Nutrition Therapy:  Appt start time: 1100 end time:  1200.  Assessment:  Primary concerns today: Weight management and Blood sugar control. (BMI >30, [id number]; DM w/out complica, [id number])  Lori Jones is no longer checking BG, having had an A1C of 5.8 last week.  She has discontinued Tradjenta, but is still taking metformin.  She has been on a 40-day modified fast again, related to church activities, which ends this week, on Good Friday.  This restricts bread, sweets, and red meat.  She has found a church she is very happy with now, and her minister encourages exercise.    Lori Jones is walking 60 minutes at least 6 times a week, usually with a friend from church.  Lori Jones walks on her own on days her friend can't make it.  She said she feels very good right now, and attributes much of her current satisfaction with her consistent exercise as well as participation in this new church.    24-hr recall:  (Up at 5:30 AM; water first thing, then church at 7:30) B (9:30 AM)-  1/2 c [place] grits, 1 egg, water Snk ( AM)-  --- L (2:30 PM)-  1/2 c collards, 1/2 c corn, 1/2 c mac&chs, 1/4 c rice, water Snk ( PM)-  water D (7 PM)-  1 c Frosted Flks, 3/4 c 1% milk Snk ( PM)-  --- Typical day? No.  Usually has 4-5 peanut butter and crackers for lunch, and snacks on nuts.  Dinner is usually more what lunch was yesterday.  Also usually gets breakfast earlier, although yesterday was a pretty typical Sunday.     Progress Towards Goal(s):  In progress.     Nutritional Diagnosis:  Continued progress noted on NI-5.8.2 Excessive carbohydrate intake As related to meals.  As evidenced by markedly reduced carb intake; limited to 2 carb portions at most meals, as recommended.    Intervention:  Nutrition education.  Handouts given during visit include:  AVS   Demonstrated degree of understanding via:  Teach Back   Monitoring/Evaluation:  Dietary intake, exercise, BG, and body weight prn.

## 2015-07-18 ENCOUNTER — Encounter: Payer: Self-pay | Admitting: Family Medicine

## 2015-07-19 ENCOUNTER — Emergency Department (HOSPITAL_COMMUNITY)
Admission: EM | Admit: 2015-07-19 | Discharge: 2015-07-19 | Disposition: A | Discharge status: Home / Self Care | Payer: No Typology Code available for payment source | Attending: Emergency Medicine | Admitting: Emergency Medicine

## 2015-07-19 ENCOUNTER — Emergency Department (HOSPITAL_COMMUNITY): Payer: No Typology Code available for payment source

## 2015-07-19 ENCOUNTER — Encounter (HOSPITAL_COMMUNITY): Payer: Self-pay | Admitting: Emergency Medicine

## 2015-07-19 DIAGNOSIS — G8929 Other chronic pain: Secondary | ICD-10-CM | POA: Diagnosis not present

## 2015-07-19 DIAGNOSIS — J45909 Unspecified asthma, uncomplicated: Secondary | ICD-10-CM | POA: Insufficient documentation

## 2015-07-19 DIAGNOSIS — Z862 Personal history of diseases of the blood and blood-forming organs and certain disorders involving the immune mechanism: Secondary | ICD-10-CM | POA: Insufficient documentation

## 2015-07-19 DIAGNOSIS — E7800 Pure hypercholesterolemia, unspecified: Secondary | ICD-10-CM | POA: Diagnosis not present

## 2015-07-19 DIAGNOSIS — M1990 Unspecified osteoarthritis, unspecified site: Secondary | ICD-10-CM | POA: Insufficient documentation

## 2015-07-19 DIAGNOSIS — E119 Type 2 diabetes mellitus without complications: Secondary | ICD-10-CM | POA: Insufficient documentation

## 2015-07-19 DIAGNOSIS — F329 Major depressive disorder, single episode, unspecified: Secondary | ICD-10-CM | POA: Diagnosis not present

## 2015-07-19 DIAGNOSIS — Y998 Other external cause status: Secondary | ICD-10-CM | POA: Diagnosis not present

## 2015-07-19 DIAGNOSIS — R011 Cardiac murmur, unspecified: Secondary | ICD-10-CM | POA: Diagnosis not present

## 2015-07-19 DIAGNOSIS — R079 Chest pain, unspecified: Secondary | ICD-10-CM | POA: Diagnosis not present

## 2015-07-19 DIAGNOSIS — Y92410 Unspecified street and highway as the place of occurrence of the external cause: Secondary | ICD-10-CM | POA: Insufficient documentation

## 2015-07-19 DIAGNOSIS — Y9389 Activity, other specified: Secondary | ICD-10-CM | POA: Diagnosis not present

## 2015-07-19 DIAGNOSIS — F419 Anxiety disorder, unspecified: Secondary | ICD-10-CM | POA: Insufficient documentation

## 2015-07-19 DIAGNOSIS — Z8673 Personal history of transient ischemic attack (TIA), and cerebral infarction without residual deficits: Secondary | ICD-10-CM | POA: Diagnosis not present

## 2015-07-19 DIAGNOSIS — E039 Hypothyroidism, unspecified: Secondary | ICD-10-CM | POA: Insufficient documentation

## 2015-07-19 DIAGNOSIS — S3992XA Unspecified injury of lower back, initial encounter: Secondary | ICD-10-CM | POA: Insufficient documentation

## 2015-07-19 DIAGNOSIS — Z79899 Other long term (current) drug therapy: Secondary | ICD-10-CM | POA: Insufficient documentation

## 2015-07-19 DIAGNOSIS — I10 Essential (primary) hypertension: Secondary | ICD-10-CM | POA: Insufficient documentation

## 2015-07-19 DIAGNOSIS — S2190XA Unspecified open wound of unspecified part of thorax, initial encounter: Secondary | ICD-10-CM | POA: Diagnosis not present

## 2015-07-19 DIAGNOSIS — Z87448 Personal history of other diseases of urinary system: Secondary | ICD-10-CM | POA: Insufficient documentation

## 2015-07-19 DIAGNOSIS — M545 Low back pain: Secondary | ICD-10-CM

## 2015-07-19 DIAGNOSIS — Z7982 Long term (current) use of aspirin: Secondary | ICD-10-CM | POA: Diagnosis not present

## 2015-07-19 DIAGNOSIS — Z7984 Long term (current) use of oral hypoglycemic drugs: Secondary | ICD-10-CM | POA: Diagnosis not present

## 2015-07-19 MED ORDER — TRAMADOL HCL 50 MG PO TABS: 50.0000 mg | ORAL_TABLET | Freq: Four times a day (QID) | ORAL | Status: DC | PRN

## 2015-07-19 MED ORDER — CYCLOBENZAPRINE HCL 5 MG PO TABS: 5.0000 mg | ORAL_TABLET | Freq: Three times a day (TID) | ORAL | Status: DC | PRN
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
## 2015-07-19 NOTE — ED Notes (Signed)
Patient was alert, oriented and stable upon discharge. RN went over AVS and patient had no further questions.  
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
## 2015-07-19 NOTE — ED Notes (Signed)
MD at bedside. 
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
## 2015-07-19 NOTE — Progress Notes (Signed)
CSW met with patient at bedside. Patient was alert and oriented. There was no family present.  Patient confirms that she presents to [facility] due to fall. Patient states that she has an infant at home, and fell due to trying to climb over a baby gate. Patient denies falling often.  Patient states that she lives at home with her husband in [place], and that she completes her ADL's independently. Patient states that prior to falling she was able to complete her ADL's independently. Patient states that if rehab is needed she is not interested in facility. Patient informed CSW that she feels as though she has a good support system.  Patient states that she has no questions for CSW at this time.  Willette Brace [phone] ED CSW 07/19/2015 6:23 PM

## 2015-07-26 ENCOUNTER — Ambulatory Visit (INDEPENDENT_AMBULATORY_CARE_PROVIDER_SITE_OTHER): Payer: Medicare Other | Admitting: Family Medicine

## 2015-07-26 VITALS — BP 126/76 | HR 85 | Temp 98.9°F | Wt 172.9 lb

## 2015-07-26 DIAGNOSIS — Z043 Encounter for examination and observation following other accident: Secondary | ICD-10-CM

## 2015-07-26 DIAGNOSIS — Z041 Encounter for examination and observation following transport accident: Secondary | ICD-10-CM | POA: Insufficient documentation

## 2015-07-26 NOTE — Patient Instructions (Addendum)
Thank you for coming in today, it was so nice to see you!  Today we talked about your neck and back pain.   Please take Ibuprofen as needed, if that does not work, you can try taking Flexeril as prescribed.   I have ordered for you to get a neck and back XRAY. You can go to [facility] to have these done.   I'd like to see you back in July or sooner if needed if your pain does not resolve within the next couple weeks.  If you have any questions or concerns, please do not hesitate to call the office at [phone].  Sincerely,  Smitty Cords, MD  Lumbosacral Strain Lumbosacral strain is a strain of any of the parts that make up your lumbosacral vertebrae. Your lumbosacral vertebrae are the bones that make up the lower third of your backbone. Your lumbosacral vertebrae are held together by muscles and tough, fibrous tissue (ligaments).  CAUSES  A sudden blow to your back can cause lumbosacral strain. Also, anything that causes an excessive stretch of the muscles in the low back can cause this strain. This is typically seen when people exert themselves strenuously, fall, lift heavy objects, bend, or crouch repeatedly. RISK FACTORS  Physically demanding work.  Participation in pushing or pulling sports or sports that require a sudden twist of the back (tennis, golf, baseball).  Weight lifting.  Excessive lower back curvature.  Forward-tilted pelvis.  Weak back or abdominal muscles or both.  Tight hamstrings. SIGNS AND SYMPTOMS  Lumbosacral strain may cause pain in the area of your injury or pain that moves (radiates) down your leg.  DIAGNOSIS Your health care provider can often diagnose lumbosacral strain through a physical exam. In some cases, you may need tests such as X-ray exams.  TREATMENT  Treatment for your lower back injury depends on many factors that your clinician will have to evaluate. However, most treatment will include the use of anti-inflammatory  medicines. HOME CARE INSTRUCTIONS   Avoid hard physical activities (tennis, racquetball, waterskiing) if you are not in proper physical condition for it. This may aggravate or create problems.  If you have a back problem, avoid sports requiring sudden body movements. Swimming and walking are generally safer activities.  Maintain good posture.  Maintain a healthy weight.  For acute conditions, you may put ice on the injured area.  Put ice in a plastic bag.  Place a towel between your skin and the bag.  Leave the ice on for 20 minutes, 2-3 times a day.  When the low back starts healing, stretching and strengthening exercises may be recommended. SEEK MEDICAL CARE IF:  Your back pain is getting worse.  You experience severe back pain not relieved with medicines. SEEK IMMEDIATE MEDICAL CARE IF:   You have numbness, tingling, weakness, or problems with the use of your arms or legs.  There is a change in bowel or bladder control.  You have increasing pain in any area of the body, including your belly (abdomen).  You notice shortness of breath, dizziness, or feel faint.  You feel sick to your stomach (nauseous), are throwing up (vomiting), or become sweaty.  You notice discoloration of your toes or legs, or your feet get very cold. MAKE SURE YOU:   Understand these instructions.  Will watch your condition.  Will get help right away if you are not doing well or get worse.   This information is not intended to replace advice given to you  by your health care provider. Make sure you discuss any questions you have with your health care provider.   Document Released: 12/26/2004 Document Revised: 04/08/2014 Document Reviewed: 11/04/2012 Elsevier Interactive Patient Education [employer].
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
## 2015-07-28 ENCOUNTER — Other Ambulatory Visit: Payer: Medicare Other

## 2015-07-28 ENCOUNTER — Ambulatory Visit
Admission: RE | Admit: 2015-07-28 | Discharge: 2015-07-28 | Disposition: A | Discharge status: Home / Self Care | Payer: Medicare Other | Source: Ambulatory Visit | Attending: Family Medicine | Admitting: Family Medicine

## 2015-07-28 ENCOUNTER — Telehealth: Payer: Self-pay | Admitting: Family Medicine

## 2015-07-28 DIAGNOSIS — M542 Cervicalgia: Secondary | ICD-10-CM | POA: Diagnosis not present

## 2015-07-28 DIAGNOSIS — Z041 Encounter for examination and observation following transport accident: Secondary | ICD-10-CM

## 2015-07-28 DIAGNOSIS — M546 Pain in thoracic spine: Secondary | ICD-10-CM | POA: Diagnosis not present

## 2015-07-28 NOTE — Telephone Encounter (Signed)
Called patient, informed her there are no acute changes seen on her X-Rays. She will continue using heating pads for back pain relief and walking. She was appreciate of the call.

## 2015-08-16 ENCOUNTER — Other Ambulatory Visit: Payer: Self-pay | Admitting: Family Medicine

## 2015-09-04 ENCOUNTER — Other Ambulatory Visit: Payer: Self-pay | Admitting: *Deleted

## 2015-09-04 MED ORDER — METFORMIN HCL 1000 MG PO TABS: 1000.0000 mg | ORAL_TABLET | Freq: Two times a day (BID) | ORAL | Status: DC

## 2015-11-18 ENCOUNTER — Other Ambulatory Visit: Payer: Self-pay | Admitting: Family Medicine

## 2016-02-10 ENCOUNTER — Encounter (HOSPITAL_COMMUNITY): Payer: Self-pay | Admitting: Emergency Medicine

## 2016-02-10 ENCOUNTER — Emergency Department (HOSPITAL_COMMUNITY)
Admission: EM | Admit: 2016-02-10 | Discharge: 2016-02-10 | Disposition: A | Discharge status: Home / Self Care | Payer: Medicare Other | Attending: Emergency Medicine | Admitting: Emergency Medicine

## 2016-02-10 ENCOUNTER — Emergency Department (HOSPITAL_COMMUNITY): Payer: Medicare Other

## 2016-02-10 DIAGNOSIS — J45909 Unspecified asthma, uncomplicated: Secondary | ICD-10-CM | POA: Diagnosis not present

## 2016-02-10 DIAGNOSIS — Z7984 Long term (current) use of oral hypoglycemic drugs: Secondary | ICD-10-CM | POA: Diagnosis not present

## 2016-02-10 DIAGNOSIS — Y999 Unspecified external cause status: Secondary | ICD-10-CM | POA: Insufficient documentation

## 2016-02-10 DIAGNOSIS — R079 Chest pain, unspecified: Secondary | ICD-10-CM | POA: Diagnosis not present

## 2016-02-10 DIAGNOSIS — Y939 Activity, unspecified: Secondary | ICD-10-CM | POA: Insufficient documentation

## 2016-02-10 DIAGNOSIS — E119 Type 2 diabetes mellitus without complications: Secondary | ICD-10-CM | POA: Insufficient documentation

## 2016-02-10 DIAGNOSIS — Z79899 Other long term (current) drug therapy: Secondary | ICD-10-CM | POA: Insufficient documentation

## 2016-02-10 DIAGNOSIS — R059 Cough, unspecified: Secondary | ICD-10-CM

## 2016-02-10 DIAGNOSIS — I10 Essential (primary) hypertension: Secondary | ICD-10-CM | POA: Insufficient documentation

## 2016-02-10 DIAGNOSIS — S99912A Unspecified injury of left ankle, initial encounter: Secondary | ICD-10-CM

## 2016-02-10 DIAGNOSIS — Z7982 Long term (current) use of aspirin: Secondary | ICD-10-CM | POA: Insufficient documentation

## 2016-02-10 DIAGNOSIS — M25572 Pain in left ankle and joints of left foot: Secondary | ICD-10-CM | POA: Diagnosis not present

## 2016-02-10 DIAGNOSIS — R0981 Nasal congestion: Secondary | ICD-10-CM

## 2016-02-10 DIAGNOSIS — Y929 Unspecified place or not applicable: Secondary | ICD-10-CM | POA: Diagnosis not present

## 2016-02-10 DIAGNOSIS — Z8673 Personal history of transient ischemic attack (TIA), and cerebral infarction without residual deficits: Secondary | ICD-10-CM | POA: Insufficient documentation

## 2016-02-10 DIAGNOSIS — X509XXA Other and unspecified overexertion or strenuous movements or postures, initial encounter: Secondary | ICD-10-CM | POA: Diagnosis not present

## 2016-02-10 DIAGNOSIS — R0982 Postnasal drip: Secondary | ICD-10-CM | POA: Diagnosis not present

## 2016-02-10 DIAGNOSIS — E039 Hypothyroidism, unspecified: Secondary | ICD-10-CM | POA: Diagnosis not present

## 2016-02-10 DIAGNOSIS — R05 Cough: Secondary | ICD-10-CM

## 2016-02-10 DIAGNOSIS — R0789 Other chest pain: Secondary | ICD-10-CM | POA: Diagnosis not present

## 2016-02-10 LAB — BASIC METABOLIC PANEL
Anion gap: 11 (ref 5–15)
BUN: 13 mg/dL (ref 6–20)
CALCIUM: 9.5 mg/dL (ref 8.9–10.3)
CO2: 23 mmol/L (ref 22–32)
CREATININE: 0.84 mg/dL (ref 0.44–1.00)
Chloride: 107 mmol/L (ref 101–111)
GFR calc Af Amer: >60 mL/min (ref >60)
Glucose, Bld: 131 mg/dL — ABNORMAL HIGH (ref 65–99)
Potassium: 3.7 mmol/L (ref 3.5–5.1)
SODIUM: 141 mmol/L (ref 135–145)

## 2016-02-10 LAB — CBC
HCT: 37.6 % (ref 36.0–46.0)
Hemoglobin: 12.1 g/dL (ref 12.0–15.0)
MCH: 26.1 pg (ref 26.0–34.0)
MCHC: 32.2 g/dL (ref 30.0–36.0)
MCV: 81.2 fL (ref 78.0–100.0)
PLATELETS: 200 10*3/uL (ref 150–400)
RBC: 4.63 MIL/uL (ref 3.87–5.11)
RDW: 12.9 % (ref 11.5–15.5)
WBC: 7.6 10*3/uL (ref 4.0–10.5)

## 2016-02-10 LAB — I-STAT TROPONIN, ED: TROPONIN I, POC: 0 ng/mL (ref 0.00–0.08)

## 2016-02-10 MED ORDER — IBUPROFEN 400 MG PO TABS
600.0000 mg | ORAL_TABLET | Freq: Once | ORAL | Status: AC
  Administered 2016-02-10: 600 mg via ORAL
  Filled 2016-02-10: qty 1

## 2016-02-10 MED ORDER — FLUTICASONE PROPIONATE 50 MCG/ACT NA SUSP: 2.0000 | Freq: Every day | NASAL | 0 refills | Status: DC
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
## 2016-02-10 NOTE — Discharge Instructions (Signed)
Use flonase daily for a week.   Take motrin for pain.   Use ankle air cast for comfort but you may bear weight on it.   See your primary care doctor and orthopedic doctor  Return to ER if you have severe pain, chest pain, trouble breathing, fevers, severe ankle pain, unable to walk.

## 2016-02-10 NOTE — ED Notes (Signed)
Pt transported to xray via wheel chair

## 2016-02-10 NOTE — Progress Notes (Signed)
Orthopedic Tech Progress Note Patient Details:  Lori Jones 30-Dec-1960 [id number]  Ortho Devices Type of Ortho Device: Ankle Air splint Ortho Device/Splint Location: LLE Ortho Device/Splint Interventions: Ordered, Application   Braulio Bosch 02/10/2016, 9:28 PM

## 2016-02-10 NOTE — ED Triage Notes (Signed)
Two days of chest pressure, felt like something heavy on left side of chest. No radiation. Denies N/V. Denies SOB. Decided to seek care today because twisted ankle on a hickory nut and is having left ankle pain.

## 2016-02-14 NOTE — Progress Notes (Signed)
   [facility] Phone: [phone]   Date of Visit: 02/15/2016   HPI: Lori Jones is a 55 y.o. female presenting to clinic today for same day appointment. PCP: Carlyle Dolly, MD Concerns today include: sore throat, nasal congestion  - symptoms started 2 weeks ago  - sore throat, nasal congestion, post-nasal drip - had a cough but this is improving, productive at times with green phlegm. Had sinus pain which has now resolved.  - no shortness of breath or ear pain - no fevers  - washed her hair and went out  -used Advil cold and sinus but stopped after having chest discomfort after taking - used coricidin but as has not needed recently - was seen in the ED on 02/10/16 for these symptoms and was given Flonase which is helping - no sick contacts  - uses cloraseptic spray, lozenge which help with symptoms  ROS: See HPI.  [place]:  Hypothyroidism DM2  PHYSICAL EXAM: BP 138/80   Pulse 73   Temp 98.7 F (37.1 C) (Oral)   Wt 176 lb (79.8 kg)   SpO2 99%   BMI 26.76 kg/m  GEN: NAD HEENT: Atraumatic, normocephalic, neck supple, EOMI, sclera clear, oropharynx with mild erythema, no exudates noted. TMs normal bilaterally  Neck: no cervical lymphadenopathy CV: RRR, no murmurs, rubs, or gallops PULM: CTAB, normal effort ABD: Soft, nontender, nondistended, NABS, no organomegaly SKIN: No rash or cyanosis; warm and well-perfused EXTR: No lower extremity edema or calf tenderness PSYCH: Mood and affect euthymic, normal rate and volume of speech NEURO: Awake, alert, no focal deficits grossly, normal speech  ASSESSMENT/PLAN:  Viral URI with cough: with improving symptoms.  - continue symptomatic management but avoid Advil cold and sinus. Can use chlorasecptic spray/lozenge, flonase, tea with honey. - return precautions discussed  Smiley Houseman, MD PGY [place]

## 2016-02-15 ENCOUNTER — Encounter: Payer: Self-pay | Admitting: Internal Medicine

## 2016-02-15 ENCOUNTER — Ambulatory Visit (INDEPENDENT_AMBULATORY_CARE_PROVIDER_SITE_OTHER): Payer: Medicare Other | Admitting: Internal Medicine

## 2016-02-15 VITALS — BP 138/80 | HR 73 | Temp 98.7°F | Wt 176.0 lb

## 2016-02-15 DIAGNOSIS — E089 Diabetes mellitus due to underlying condition without complications: Secondary | ICD-10-CM | POA: Diagnosis not present

## 2016-02-15 DIAGNOSIS — J069 Acute upper respiratory infection, unspecified: Secondary | ICD-10-CM

## 2016-02-15 DIAGNOSIS — B9789 Other viral agents as the cause of diseases classified elsewhere: Secondary | ICD-10-CM | POA: Diagnosis not present

## 2016-02-15 LAB — POCT GLYCOSYLATED HEMOGLOBIN (HGB A1C): HEMOGLOBIN A1C: 6.6

## 2016-02-15 NOTE — Patient Instructions (Signed)
I am glad your symptoms are getting better. I think your symptoms are caused by a virus. You can continue to use the throat spray and lozenges as needed.   Please make an appointment with your PCP (Dr. Juanito Doom) for a wellness visit

## 2016-02-16 ENCOUNTER — Telehealth: Payer: Self-pay | Admitting: Family Medicine

## 2016-02-16 NOTE — Telephone Encounter (Signed)
Scheduled AWV 02/21/16 at 2:30p

## 2016-02-20 ENCOUNTER — Other Ambulatory Visit: Payer: Self-pay | Admitting: Family Medicine

## 2016-02-21 ENCOUNTER — Ambulatory Visit (INDEPENDENT_AMBULATORY_CARE_PROVIDER_SITE_OTHER): Payer: Medicare Other | Admitting: *Deleted

## 2016-02-21 ENCOUNTER — Encounter: Payer: Self-pay | Admitting: *Deleted

## 2016-02-21 VITALS — BP 113/69 | HR 61 | Temp 98.2°F | Ht 67.5 in | Wt 175.8 lb

## 2016-02-21 DIAGNOSIS — Z1159 Encounter for screening for other viral diseases: Secondary | ICD-10-CM | POA: Diagnosis not present

## 2016-02-21 DIAGNOSIS — Z0000 Encounter for general adult medical examination without abnormal findings: Secondary | ICD-10-CM | POA: Diagnosis not present

## 2016-02-21 DIAGNOSIS — Z114 Encounter for screening for human immunodeficiency virus [HIV]: Secondary | ICD-10-CM

## 2016-02-21 NOTE — Patient Instructions (Signed)
 Diabetes and Foot Care Diabetes may cause you to have problems because of poor blood supply (circulation) to your feet and legs. This may cause the skin on your feet to become thinner, break easier, and heal more slowly. Your skin may become dry, and the skin may peel and crack. You may also have nerve damage in your legs and feet causing decreased feeling in them. You may not notice minor injuries to your feet that could lead to infections or more serious problems. Taking care of your feet is one of the most important things you can do for yourself. Follow these instructions at home:  Wear shoes at all times, even in the house. Do not go barefoot. Bare feet are easily injured.  Check your feet daily for blisters, cuts, and redness. If you cannot see the bottom of your feet, use a mirror or ask someone for help.  Wash your feet with warm water (do not use hot water) and mild soap. Then pat your feet and the areas between your toes until they are completely dry. Do not soak your feet as this can dry your skin.  Apply a moisturizing lotion or petroleum jelly (that does not contain alcohol and is unscented) to the skin on your feet and to dry, brittle toenails. Do not apply lotion between your toes.  Trim your toenails straight across. Do not dig under them or around the cuticle. File the edges of your nails with an emery board or nail file.  Do not cut corns or calluses or try to remove them with medicine.  Wear clean socks or stockings every day. Make sure they are not too tight. Do not wear knee-high stockings since they may decrease blood flow to your legs.  Wear shoes that fit properly and have enough cushioning. To break in new shoes, wear them for just a few hours a day. This prevents you from injuring your feet. Always look in your shoes before you put them on to be sure there are no objects inside.  Do not cross your legs. This may decrease the blood flow to your feet.  If you find a  minor scrape, cut, or break in the skin on your feet, keep it and the skin around it clean and dry. These areas may be cleansed with mild soap and water. Do not cleanse the area with peroxide, alcohol, or iodine.  When you remove an adhesive bandage, be sure not to damage the skin around it.  If you have a wound, look at it several times a day to make sure it is healing.  Do not use heating pads or hot water bottles. They may burn your skin. If you have lost feeling in your feet or legs, you may not know it is happening until it is too late.  Make sure your health care provider performs a complete foot exam at least annually or more often if you have foot problems. Report any cuts, sores, or bruises to your health care provider immediately. Contact a health care provider if:  You have an injury that is not healing.  You have cuts or breaks in the skin.  You have an ingrown nail.  You notice redness on your legs or feet.  You feel burning or tingling in your legs or feet.  You have pain or cramps in your legs and feet.  Your legs or feet are numb.  Your feet always feel cold. Get help right away if:  There is   increasing redness, swelling, or pain in or around a wound.  There is a red line that goes up your leg.  Pus is coming from a wound.  You develop a fever or as directed by your health care provider.  You notice a bad smell coming from an ulcer or wound. This information is not intended to replace advice given to you by your health care provider. Make sure you discuss any questions you have with your health care provider. Document Released: 03/15/2000 Document Revised: 08/24/2015 Document Reviewed: 08/25/2012 Else[employer] Interactive Patient Education  2017 [place] Prevention in the Home Introduction Falls can cause injuries. They can happen to people of all ages. There are many things you can do to make your home safe and to help prevent falls. What can I do  on the outside of my home?  Regularly fix the edges of walkways and driveways and fix any cracks.  Remove anything that might make you trip as you walk through a door, such as a raised step or threshold.  Trim any bushes or trees on the path to your home.  Use bright outdoor lighting.  Clear any walking paths of anything that might make someone trip, such as rocks or tools.  Regularly check to see if handrails are loose or broken. Make sure that both sides of any steps have handrails.  Any raised decks and porches should have guardrails on the edges.  Have any leaves, snow, or ice cleared regularly.  Use sand or salt on walking paths during winter.  Clean up any spills in your garage right away. This includes oil or grease spills. What can I do in the bathroom?  Use night lights.  Install grab bars by the toilet and in the tub and shower. Do not use towel bars as grab bars.  Use non-skid mats or decals in the tub or shower.  If you need to sit down in the shower, use a plastic, non-slip stool.  Keep the floor dry. Clean up any water that spills on the floor as soon as it happens.  Remove soap buildup in the tub or shower regularly.  Attach bath mats securely with double-sided non-slip rug tape.  Do not have throw rugs and other things on the floor that can make you trip. What can I do in the bedroom?  Use night lights.  Make sure that you have a light by your bed that is easy to reach.  Do not use any sheets or blankets that are too big for your bed. They should not hang down onto the floor.  Have a firm chair that has side arms. You can use this for support while you get dressed.  Do not have throw rugs and other things on the floor that can make you trip. What can I do in the kitchen?  Clean up any spills right away.  Avoid walking on wet floors.  Keep items that you use a lot in easy-to-reach places.  If you need to reach something above you, use a strong  step stool that has a grab bar.  Keep electrical cords out of the way.  Do not use floor polish or wax that makes floors slippery. If you must use wax, use non-skid floor wax.  Do not have throw rugs and other things on the floor that can make you trip. What can I do with my stairs?  Do not leave any items on the stairs.  Make sure that  there are handrails on both sides of the stairs and use them. Fix handrails that are broken or loose. Make sure that handrails are as long as the stairways.  Check any carpeting to make sure that it is firmly attached to the stairs. Fix any carpet that is loose or worn.  Avoid having throw rugs at the top or bottom of the stairs. If you do have throw rugs, attach them to the floor with carpet tape.  Make sure that you have a light switch at the top of the stairs and the bottom of the stairs. If you do not have them, ask someone to add them for you. What else can I do to help prevent falls?  Wear shoes that:  Do not have high heels.  Have rubber bottoms.  Are comfortable and fit you well.  Are closed at the toe. Do not wear sandals.  If you use a stepladder:  Make sure that it is fully opened. Do not climb a closed stepladder.  Make sure that both sides of the stepladder are locked into place.  Ask someone to hold it for you, if possible.  Clearly mark and make sure that you can see:  Any grab bars or handrails.  First and last steps.  Where the edge of each step is.  Use tools that help you move around (mobility aids) if they are needed. These include:  Canes.  Walkers.  Scooters.  Crutches.  Turn on the lights when you go into a dark area. Replace any light bulbs as soon as they burn out.  Set up your furniture so you have a clear path. Avoid moving your furniture around.  If any of your floors are uneven, fix them.  If there are any pets around you, be aware of where they are.  Review your medicines with your doctor.  Some medicines can make you feel dizzy. This can increase your chance of falling. Ask your doctor what other things that you can do to help prevent falls. This information is not intended to replace advice given to you by your health care provider. Make sure you discuss any questions you have with your health care provider. Document Released: 01/12/2009 Document Revised: 08/24/2015 Document Reviewed: 04/22/2014  2017 [employer]  Health Maintenance, Female Introduction Adopting a healthy lifestyle and getting preventive care can go a long way to promote health and wellness. Talk with your health care provider about what schedule of regular examinations is right for you. This is a good chance for you to check in with your provider about disease prevention and staying healthy. In between checkups, there are plenty of things you can do on your own. Experts have done a lot of research about which lifestyle changes and preventive measures are most likely to keep you healthy. Ask your health care provider for more information. Weight and diet Eat a healthy diet  Be sure to include plenty of vegetables, fruits, low-fat dairy products, and lean protein.  Do not eat a lot of foods high in solid fats, added sugars, or salt.  Get regular exercise. This is one of the most important things you can do for your health.  Most adults should exercise for at least 150 minutes each week. The exercise should increase your heart rate and make you sweat (moderate-intensity exercise).  Most adults should also do strengthening exercises at least twice a week. This is in addition to the moderate-intensity exercise. Maintain a healthy weight  Body mass index (BMI)  is a measurement that can be used to identify possible weight problems. It estimates body fat based on height and weight. Your health care provider can help determine your BMI and help you achieve or maintain a healthy weight.  For females 36 years of age and  older:  A BMI below 18.5 is considered underweight.  A BMI of 18.5 to 24.9 is normal.  A BMI of 25 to 29.9 is considered overweight.  A BMI of 30 and above is considered obese. Watch levels of cholesterol and blood lipids  You should start having your blood tested for lipids and cholesterol at 56 years of age, then have this test every 5 years.  You may need to have your cholesterol levels checked more often if:  Your lipid or cholesterol levels are high.  You are older than 56 years of age.  You are at high risk for heart disease. Cancer screening Lung Cancer  Lung cancer screening is recommended for adults 62-23 years old who are at high risk for lung cancer because of a history of smoking.  A yearly low-dose CT scan of the lungs is recommended for people who:  Currently smoke.  Have quit within the past 15 years.  Have at least a 30-pack-year history of smoking. A pack year is smoking an average of one pack of cigarettes a day for 1 year.  Yearly screening should continue until it has been 15 years since you quit.  Yearly screening should stop if you develop a health problem that would prevent you from having lung cancer treatment. Breast Cancer  Practice breast self-awareness. This means understanding how your breasts normally appear and feel.  It also means doing regular breast self-exams. Let your health care provider know about any changes, no matter how small.  If you are in your 20s or 30s, you should have a clinical breast exam (CBE) by a health care provider every 1-3 years as part of a regular health exam.  If you are 63 or older, have a CBE every year. Also consider having a breast X-ray (mammogram) every year.  If you have a family history of breast cancer, talk to your health care provider about genetic screening.  If you are at high risk for breast cancer, talk to your health care provider about having an MRI and a mammogram every year.  Breast cancer  gene (BRCA) assessment is recommended for women who have family members with BRCA-related cancers. BRCA-related cancers include:  Breast.  Ovarian.  Tubal.  Peritoneal cancers.  Results of the assessment will determine the need for genetic counseling and BRCA1 and BRCA2 testing. Cervical Cancer  Your health care provider may recommend that you be screened regularly for cancer of the pelvic organs (ovaries, uterus, and vagina). This screening involves a pelvic examination, including checking for microscopic changes to the surface of your cervix (Pap test). You may be encouraged to have this screening done every 3 years, beginning at age 72.  For women ages 64-65, health care providers may recommend pelvic exams and Pap testing every 3 years, or they may recommend the Pap and pelvic exam, combined with testing for human papilloma virus (HPV), every 5 years. Some types of HPV increase your risk of cervical cancer. Testing for HPV may also be done on women of any age with unclear Pap test results.  Other health care providers may not recommend any screening for nonpregnant women who are considered low risk for pelvic cancer and who do not  have symptoms. Ask your health care provider if a screening pelvic exam is right for you.  If you have had past treatment for cervical cancer or a condition that could lead to cancer, you need Pap tests and screening for cancer for at least 20 years after your treatment. If Pap tests have been discontinued, your risk factors (such as having a new sexual partner) need to be reassessed to determine if screening should resume. Some women have medical problems that increase the chance of getting cervical cancer. In these cases, your health care provider may recommend more frequent screening and Pap tests. Colorectal Cancer  This type of cancer can be detected and often prevented.  Routine colorectal cancer screening usually begins at 56 years of age and continues  through 56 years of age.  Your health care provider may recommend screening at an earlier age if you have risk factors for colon cancer.  Your health care provider may also recommend using home test kits to check for hidden blood in the stool.  A small camera at the end of a tube can be used to examine your colon directly (sigmoidoscopy or colonoscopy). This is done to check for the earliest forms of colorectal cancer.  Routine screening usually begins at age 50.  Direct examination of the colon should be repeated every 5-10 years through 56 years of age. However, you may need to be screened more often if early forms of precancerous polyps or small growths are found. Skin Cancer  Check your skin from head to toe regularly.  Tell your health care provider about any new moles or changes in moles, especially if there is a change in a mole's shape or color.  Also tell your health care provider if you have a mole that is larger than the size of a pencil eraser.  Always use sunscreen. Apply sunscreen liberally and repeatedly throughout the day.  Protect yourself by wearing long sleeves, pants, a wide-brimmed hat, and sunglasses whenever you are outside. Heart disease, diabetes, and high blood pressure  High blood pressure causes heart disease and increases the risk of stroke. High blood pressure is more likely to develop in:  People who have blood pressure in the high end of the normal range (130-139/85-89 mm Hg).  People who are overweight or obese.  People who are African American.  If you are 18-39 years of age, have your blood pressure checked every 3-5 years. If you are 40 years of age or older, have your blood pressure checked every year. You should have your blood pressure measured twice-once when you are at a hospital or clinic, and once when you are not at a hospital or clinic. Record the average of the two measurements. To check your blood pressure when you are not at a hospital  or clinic, you can use:  An automated blood pressure machine at a pharmacy.  A home blood pressure monitor.  If you are between 55 years and 79 years old, ask your health care provider if you should take aspirin to prevent strokes.  Have regular diabetes screenings. This involves taking a blood sample to check your fasting blood sugar level.  If you are at a normal weight and have a low risk for diabetes, have this test once every three years after 56 years of age.  If you are overweight and have a high risk for diabetes, consider being tested at a younger age or more often. Preventing infection Hepatitis B  If you have   a higher risk for hepatitis B, you should be screened for this virus. You are considered at high risk for hepatitis B if:  You were born in a country where hepatitis B is common. Ask your health care provider which countries are considered high risk.  Your parents were born in a high-risk country, and you have not been immunized against hepatitis B (hepatitis B vaccine).  You have HIV or AIDS.  You use needles to inject street drugs.  You live with someone who has hepatitis B.  You have had sex with someone who has hepatitis B.  You get hemodialysis treatment.  You take certain medicines for conditions, including cancer, organ transplantation, and autoimmune conditions. Hepatitis C  Blood testing is recommended for:  Everyone born from [birth year] through 1966.  Anyone with known risk factors for hepatitis C. Sexually transmitted infections (STIs)  You should be screened for sexually transmitted infections (STIs) including gonorrhea and chlamydia if:  You are sexually active and are younger than 56 years of age.  You are older than 56 years of age and your health care provider tells you that you are at risk for this type of infection.  Your sexual activity has changed since you were last screened and you are at an increased risk for chlamydia or gonorrhea. Ask  your health care provider if you are at risk.  If you do not have HIV, but are at risk, it may be recommended that you take a prescription medicine daily to prevent HIV infection. This is called pre-exposure prophylaxis (PrEP). You are considered at risk if:  You are sexually active and do not regularly use condoms or know the HIV status of your partner(s).  You take drugs by injection.  You are sexually active with a partner who has HIV. Talk with your health care provider about whether you are at high risk of being infected with HIV. If you choose to begin PrEP, you should first be tested for HIV. You should then be tested every 3 months for as long as you are taking PrEP. Pregnancy  If you are premenopausal and you may become pregnant, ask your health care provider about preconception counseling.  If you may become pregnant, take 400 to 800 micrograms (mcg) of folic acid every day.  If you want to prevent pregnancy, talk to your health care provider about birth control (contraception). Osteoporosis and menopause  Osteoporosis is a disease in which the bones lose minerals and strength with aging. This can result in serious bone fractures. Your risk for osteoporosis can be identified using a bone density scan.  If you are 55 years of age or older, or if you are at risk for osteoporosis and fractures, ask your health care provider if you should be screened.  Ask your health care provider whether you should take a calcium or vitamin D supplement to lower your risk for osteoporosis.  Menopause may have certain physical symptoms and risks.  Hormone replacement therapy may reduce some of these symptoms and risks. Talk to your health care provider about whether hormone replacement therapy is right for you. Follow these instructions at home:  Schedule regular health, dental, and eye exams.  Stay current with your immunizations.  Do not use any tobacco products including cigarettes,  chewing tobacco, or electronic cigarettes.  If you are pregnant, do not drink alcohol.  If you are breastfeeding, limit how much and how often you drink alcohol.  Limit alcohol intake to no more than 1  drink per day for nonpregnant women. One drink equals 12 ounces of beer, 5 ounces of wine, or 1 ounces of hard liquor.  Do not use street drugs.  Do not share needles.  Ask your health care provider for help if you need support or information about quitting drugs.  Tell your health care provider if you often feel depressed.  Tell your health care provider if you have ever been abused or do not feel safe at home. This information is not intended to replace advice given to you by your health care provider. Make sure you discuss any questions you have with your health care provider. Document Released: 10/01/2010 Document Revised: 08/24/2015 Document Reviewed: 12/20/2014  2017 [employer]

## 2016-02-21 NOTE — Progress Notes (Signed)
Subjective:   Lori Jones is a 55 y.o. female who presents for an Initial Medicare Annual Wellness Visit.  Cardiac Risk Factors include: dyslipidemia     Objective:    Today's Vitals   02/21/16 1443 02/21/16 1444  BP:  113/69  Pulse:  61  Temp:  98.2 F (36.8 C)  TempSrc:  Oral  SpO2:  100%  Weight: 175 lb 12.8 oz (79.7 kg)   Height: 5' 7.5" (1.715 m)   PainSc:  3    Body mass index is 27.13 kg/m.   Current Medications (verified) Outpatient Encounter Prescriptions as of 02/21/2016  Medication Sig  . aspirin EC 81 MG tablet Take 81 mg by mouth daily.  Marland Kitchen atorvastatin (LIPITOR) 40 MG tablet Take 1 tablet (40 mg total) by mouth daily. (Patient taking differently: Take 40 mg by mouth at bedtime. )  . fluticasone (FLONASE) 50 MCG/ACT nasal spray Place 2 sprays into both nostrils daily.  Marland Kitchen ibuprofen (ADVIL,MOTRIN) 200 MG tablet Take 400 mg by mouth every 6 (six) hours as needed.  Marland Kitchen levothyroxine (SYNTHROID, LEVOTHROID) 25 MCG tablet TAKE 1 TABLET BY MOUTH EVERY DAY BEFORE BREAKFAST, OVERDUE FOR THYROID FOLLOW UP  . metFORMIN (GLUCOPHAGE) 500 MG tablet Take 500 mg by mouth 2 (two) times daily with a meal.   . cyclobenzaprine (FLEXERIL) 5 MG tablet Take 1 tablet (5 mg total) by mouth 3 (three) times daily as needed for muscle spasms. (Patient not taking: Reported on 02/21/2016)  . escitalopram (LEXAPRO) 10 MG tablet Take 1 tablet (10 mg total) by mouth daily. (Patient not taking: Reported on 02/21/2016)  . glucose blood (ACCU-CHEK AVIVA PLUS) test strip Use in the morning to check fasting glucose (Patient not taking: Reported on 02/21/2016)  . lisinopril (PRINIVIL,ZESTRIL) 2.5 MG tablet Take 1 tablet (2.5 mg total) by mouth daily. (Patient not taking: Reported on 02/21/2016)  . metFORMIN (GLUCOPHAGE) 1000 MG tablet Take 1 tablet (1,000 mg total) by mouth 2 (two) times daily with a meal. (Patient not taking: Reported on 02/21/2016)  . traMADol (ULTRAM) 50 MG tablet Take 1  tablet (50 mg total) by mouth every 6 (six) hours as needed. Reported on 07/07/2015 (Patient not taking: Reported on 02/21/2016)   No facility-administered encounter medications on file as of 02/21/2016.     Allergies (verified) [name] [clarithromycin]; Imitrex [sumatriptan]; and Canagliflozin   History: Past Medical History:  Diagnosis Date  . Allergy   . Anemia   . Anginal pain (HCC)    hx of CPs  . Anxiety   . Arthritis   . Asthma    hx of in childhood   . Back pain, chronic   . Depression   . Diabetes mellitus   . Heart murmur   . Herniated disc   . Hypercholesteremia   . Hypertension   . Hypothyroid   . Kidney disease    "pelvic kidney" bilat   . Scoliosis   . Seizures ([place])    hx of in childhood and again in college   . Stroke [facility])    hx of sun stroke while in college    Past Surgical History:  Procedure Laterality Date  . ABDOMINAL HYSTERECTOMY    . CESAREAN SECTION    . CHOLECYSTECTOMY N/A 09/08/2014   Procedure: LAPAROSCOPIC CHOLECYSTECTOMY;  Surgeon: Coralie Keens, MD;  Location: [facility];  Service: General;  Laterality: N/A;  . HAND SURGERY     dog bite  . Thumb surgery    . VENTRAL  HERNIA REPAIR N/A 09/08/2014   Procedure: VENTRAL HERNIA REPAIR;  Surgeon: Coralie Keens, MD;  Location: [facility];  Service: General;  Laterality: N/A;   Family History  Problem Relation Age of Onset  . Cancer Mother   . Depression Mother   . Diabetes Mother   . Heart disease Mother 37    Stents  . Hypertension Mother   . Hyperlipidemia Mother   . Thyroid disease Mother   . Cancer Father   . Sickle cell anemia Brother    Social History   Occupational History  . Not on file.   Social History Main Topics  . Smoking status: Never Smoker  . Smokeless tobacco: Never Used  . Alcohol use No  . Drug use: No  . Sexual activity: Not on file    Tobacco Counseling Counseling given: Yes Patient has never smoked and has no plans to start.   Activities of Daily  Living In your present state of health, do you have any difficulty performing the following activities: 02/21/2016  Hearing? Y  Vision? N  Difficulty concentrating or making decisions? N  Walking or climbing stairs? Y  Dressing or bathing? N  Doing errands, shopping? N  Preparing Food and eating ? N  Using the Toilet? N  In the past six months, have you accidently leaked urine? Y  Do you have problems with loss of bowel control? N  Managing your Medications? N  Managing your Finances? N  Housekeeping or managing your Housekeeping? N  Some recent data might be hidden   Home Safety:  My home has a working smoke alarm:  Yes X 1           My home throw rugs have been fastened down to the floor or removed:  No, but will fasten down with double-sided tape I have non-slip mats in the bathtub and shower:  No         All my home's stairs have railings or bannisters: One level home with 3 outside stairs with railings           My home's floors, stairs and hallways are free from clutter, wires and cords:  Yes     I wear seatbelts consistently:  Yes   Immunizations and Health Maintenance Immunization History  Administered Date(s) Administered  . Tdap 06/22/2015   Health Maintenance Due  Topic Date Due  . Hepatitis C Screening  [birth year]  . PNEUMOCOCCAL POLYSACCHARIDE VACCINE (1) 09/02/1961  . HIV Screening  09/03/1974  . PAP SMEAR  09/02/1980  . COLONOSCOPY  09/02/2009  . INFLUENZA VACCINE  10/31/2015  . FOOT EXAM  02/08/2016  Hep C and HIV drawn today Patient will receive flu vaccine and pneumovax at Dec 4th visit with PCP Patient states she had colonoscopy within last 5 years at Dr. Lorie Apley office Has appt for pap scheduled on 03/20/2016 Has appt for mammogram scheduled for 03/08/2016 Foot exam done today Diabetic Foot Exam - Simple   Simple Foot Form Diabetic Foot exam was performed with the following findings:  Yes 02/21/2016  3:20 PM  Visual Inspection See comments:   Yes Sensation Testing Intact to touch and monofilament testing bilaterally:  Yes Pulse Check Posterior Tibialis and Dorsalis pulse intact bilaterally:  Yes Comments Patient has calluses on side of 5th digit bilaterally, on first met head bilaterally and heel pads bilaterally. Discussed wearing foot wear that does not rub these area. Foot hygiene discussed in detail and literature given.  Hubbard Hartshorn, RN, BSN  Patient Care Team: Carlyle Dolly, MD as PCP - General (Family Medicine) [name], MD (Inactive) (Family Medicine) Kennith Center, RD as Dietitian (Family Medicine) Mariana Kaufman, RN as Case Manager Clent Jacks, MD as Consulting Physician (Ophthalmology) Juanita Craver, MD as Consulting Physician (Gastroenterology)  Indicate any recent Medical Services you may have received from other than [facility] providers in the past year (date may be approximate).     Assessment:   This is a routine wellness examination for Jamila.   Hearing/Vision screen  Hearing Screening   Method: Audiometry   _0  _1  _2  _3  _4  _5  _6  _7  _8   Right ear:   _9 Left ear:   Fail Fail Fail  Fail    Comments: Has not been able to detect sound in left ear since young childhood   Dietary issues and exercise activities discussed: Current Exercise Habits: Home exercise routine, Type of exercise: walking, Time (Minutes): 20, Frequency (Times/Week): 3, Weekly Exercise (Minutes/Week): 60, Intensity: Moderate, Exercise limited by: orthopedic condition(s) (left foot)  Goals    . Eat more fruits and vegetables (pt-stated)    . Exercise 150 minutes per week (moderate activity) (pt-stated)    . Weight (lb) < 165 lb (74.8 kg) (pt-stated)          6% weight loss      Depression Screen PHQ 2/9 Scores 02/21/2016 02/15/2016 07/07/2015 02/08/2015 09/26/2014 08/30/2014 07/26/2014  PHQ - 2 Score 0 0 0 0 0 0 0  PHQ- 9 Score - - - - - - -    Fall Risk Fall Risk   02/21/2016 07/07/2015 08/30/2014 07/26/2014 06/07/2014  Falls in the past year? Yes No No No No  Number falls in past yr: 2 or more - - - -  Injury with Fall? Yes - - - -  Risk Factor Category  High Fall Risk - - - -  Risk for fall due to : History of fall(s) - - - -  Follow up Education provided;Falls prevention discussed - - - -  TUG Test:  Done in 11 seconds. Patient used both hands to push out of chair and to sit back down. Walked with splint on left ankle due to recent ankle sprain after stepping on large hickory nut. Falls prevention discussed and literature given.   Cognitive Function: Mini-Cog  Passed with score 4/5  Screening Tests Health Maintenance  Topic Date Due  . Hepatitis C Screening  1960-04-15  . PNEUMOCOCCAL POLYSACCHARIDE VACCINE (1) 09/02/1961  . HIV Screening  09/03/1974  . PAP SMEAR  09/02/1980  . COLONOSCOPY  09/02/2009  . INFLUENZA VACCINE  10/31/2015  . FOOT EXAM  02/08/2016  . OPHTHALMOLOGY EXAM  06/12/2016  . HEMOGLOBIN A1C  08/14/2016  . MAMMOGRAM  03/06/2017  . TETANUS/TDAP  06/21/2025      Plan:   Hep C and HIV drawn today Patient will receive flu vaccine and pneumovax at Dec 4th visit with PCP Patient states she had colonoscopy within last 5 years at Dr. Lorie Apley office Has appt for pap scheduled on 03/20/2016 Has appt for mammogram scheduled for 03/08/2016 Foot exam done today   During the course of the visit, [place] was educated and counseled about the following appropriate screening and preventive services:   Vaccines to include Pneumoccal, Influenza, Td, Zostavax  Cardiovascular disease screening  Colorectal cancer screening  Bone density screening  Diabetes screening  Mammography/PAP  Nutrition counseling  Patient Instructions (the written plan)  were given to the patient.    Velora Heckler, RN   02/21/2016

## 2016-02-22 LAB — HIV ANTIBODY (ROUTINE TESTING W REFLEX): HIV 1&2 Ab, 4th Generation: NONREACTIVE

## 2016-02-22 LAB — HEPATITIS C ANTIBODY: HCV Ab: NEGATIVE

## 2016-03-01 ENCOUNTER — Encounter: Payer: Self-pay | Admitting: Family Medicine

## 2016-03-01 NOTE — Progress Notes (Signed)
Addendum: I have reviewed this visit and discussed with Lauren Ducatte, RN, BSN, and agree with her documentation 

## 2016-03-04 ENCOUNTER — Ambulatory Visit: Payer: Medicare Other | Admitting: Family Medicine

## 2016-03-04 ENCOUNTER — Other Ambulatory Visit: Payer: Self-pay | Admitting: Family Medicine

## 2016-03-04 DIAGNOSIS — E089 Diabetes mellitus due to underlying condition without complications: Secondary | ICD-10-CM

## 2016-03-06 ENCOUNTER — Encounter: Payer: Self-pay | Admitting: Family Medicine

## 2016-03-06 ENCOUNTER — Ambulatory Visit (INDEPENDENT_AMBULATORY_CARE_PROVIDER_SITE_OTHER): Payer: Medicare Other | Admitting: Family Medicine

## 2016-03-06 VITALS — BP 117/80 | HR 78 | Temp 98.4°F | Ht 67.5 in | Wt 177.0 lb

## 2016-03-06 DIAGNOSIS — K439 Ventral hernia without obstruction or gangrene: Secondary | ICD-10-CM | POA: Diagnosis not present

## 2016-03-06 DIAGNOSIS — L659 Nonscarring hair loss, unspecified: Secondary | ICD-10-CM | POA: Insufficient documentation

## 2016-03-06 DIAGNOSIS — E089 Diabetes mellitus due to underlying condition without complications: Secondary | ICD-10-CM | POA: Diagnosis not present

## 2016-03-06 DIAGNOSIS — E039 Hypothyroidism, unspecified: Secondary | ICD-10-CM | POA: Diagnosis present
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
## 2016-03-06 NOTE — Assessment & Plan Note (Signed)
Controlled. A1c goal less than 7. A1c today is at 6.6. Patient is very distressed about this because her A1c was previously 5.8. Discussed increasing her metformin to 1000 mg twice a day but the patient prefers not to at this time. -Continue metformin 500 mg twice a day, will increase metformin if A1c in 3 months is higher -Encouraged continued exercise at least 3 days a week -Follow-up in 3 months

## 2016-03-06 NOTE — Patient Instructions (Signed)
Thank you for coming in today, it was so nice to see you! Today we talked about:    Hair loss: Please take a daily multivitamin or a daily prenatal vitamin. We will also check her thyroid. You can come in to the clinic at any time and have your thyroid checked. Tell the front desk that you will need to see the lab.  Diabetes: Please continue taking the same metformin that you are taking I do recommend that we increase this if your A1c goes up. I will need to see you in 3 months for your diabetes  Hernia: I have placed referral for you to see your hernia surgeon again  Labs: All of your labwork looks normal :)   Please follow up in 3 months for your diabetes. You can schedule this appointment at the front desk before you leave or call the clinic.  Bring in all your medications or supplements to each appointment for review.   If we ordered any tests today, you will be notified via telephone of any abnormalities. If everything is normal you will get a letter in the mail.   If you have any questions or concerns, please do not hesitate to call the office at [phone]. You can also message me directly via MyChart.   Sincerely,  Smitty Cords, MD

## 2016-03-06 NOTE — Assessment & Plan Note (Addendum)
Unclear etiology. Differentials include hypothyroidism versus stress versus genetic predisposition versus vitamin deficiency.  -Discussed taking a multivitamin and considering biotin vitamins. -We'll check TSH today -Can consider starting Rogaine in the future if TSH is normal

## 2016-03-06 NOTE — Assessment & Plan Note (Signed)
Patient has had surgery on a ventral hernia in June 2016. She is having pain in the same area that she have the hernia repaired. Physical exam showing some outpouching of the ventral abdomen when she goes from a laying to sitting position. -Refer to surgery for further evaluation

## 2016-03-08 ENCOUNTER — Other Ambulatory Visit: Payer: Medicare Other

## 2016-03-08 DIAGNOSIS — E039 Hypothyroidism, unspecified: Secondary | ICD-10-CM | POA: Diagnosis not present

## 2016-03-08 DIAGNOSIS — Z1231 Encounter for screening mammogram for malignant neoplasm of breast: Secondary | ICD-10-CM | POA: Diagnosis not present

## 2016-03-08 LAB — TSH: TSH: 3.62 mIU/L

## 2016-03-11 ENCOUNTER — Telehealth: Payer: Self-pay | Admitting: Family Medicine

## 2016-03-11 NOTE — Telephone Encounter (Signed)
Called patient to let her know her TSH was normal. She was appreciative of the call. Told her she can schedule an appointment with me if she'd like to discuss her hair loss more.

## 2016-03-20 DIAGNOSIS — Z01419 Encounter for gynecological examination (general) (routine) without abnormal findings: Secondary | ICD-10-CM | POA: Diagnosis not present

## 2016-03-20 DIAGNOSIS — Z6827 Body mass index (BMI) 27.0-27.9, adult: Secondary | ICD-10-CM | POA: Diagnosis not present

## 2016-04-03 ENCOUNTER — Other Ambulatory Visit: Payer: Self-pay | Admitting: Surgery

## 2016-04-03 DIAGNOSIS — K432 Incisional hernia without obstruction or gangrene: Secondary | ICD-10-CM | POA: Diagnosis not present

## 2016-04-16 ENCOUNTER — Encounter (HOSPITAL_COMMUNITY): Payer: Self-pay

## 2016-04-16 ENCOUNTER — Encounter (HOSPITAL_COMMUNITY)
Admission: RE | Admit: 2016-04-16 | Discharge: 2016-04-16 | Disposition: A | Discharge status: Home / Self Care | Payer: Medicare Other | Source: Ambulatory Visit | Attending: Surgery | Admitting: Surgery

## 2016-04-16 ENCOUNTER — Encounter: Payer: Self-pay | Admitting: Family Medicine

## 2016-04-16 DIAGNOSIS — Z01818 Encounter for other preprocedural examination: Secondary | ICD-10-CM | POA: Insufficient documentation

## 2016-04-16 DIAGNOSIS — K432 Incisional hernia without obstruction or gangrene: Secondary | ICD-10-CM | POA: Insufficient documentation

## 2016-04-16 LAB — BASIC METABOLIC PANEL
ANION GAP: 7 (ref 5–15)
BUN: 9 mg/dL (ref 6–20)
CO2: 26 mmol/L (ref 22–32)
Calcium: 9.4 mg/dL (ref 8.9–10.3)
Chloride: 107 mmol/L (ref 101–111)
Creatinine, Ser: 0.77 mg/dL (ref 0.44–1.00)
GLUCOSE: 122 mg/dL — AB (ref 65–99)
POTASSIUM: 4.1 mmol/L (ref 3.5–5.1)
Sodium: 140 mmol/L (ref 135–145)

## 2016-04-16 LAB — CBC
HEMATOCRIT: 39.2 % (ref 36.0–46.0)
Hemoglobin: 12.6 g/dL (ref 12.0–15.0)
MCH: 26.4 pg (ref 26.0–34.0)
MCHC: 32.1 g/dL (ref 30.0–36.0)
MCV: 82.2 fL (ref 78.0–100.0)
Platelets: 222 10*3/uL (ref 150–400)
RBC: 4.77 MIL/uL (ref 3.87–5.11)
RDW: 13.2 % (ref 11.5–15.5)
WBC: 4.3 10*3/uL (ref 4.0–10.5)

## 2016-04-16 LAB — GLUCOSE, CAPILLARY: Glucose-Capillary: 110 mg/dL — ABNORMAL HIGH (ref 65–99)

## 2016-04-16 MED ORDER — CHLORHEXIDINE GLUCONATE CLOTH 2 % EX PADS: 6.0000 | MEDICATED_PAD | Freq: Once | CUTANEOUS | Status: DC

## 2016-04-16 NOTE — Pre-Procedure Instructions (Signed)
    Lori Jones  04/16/2016      [facility] - [place], [place] [address] [place] [place] [address] Phone: [phone] Fax: [phone]    Your procedure is scheduled on 04/18/16.  Report to [facility] Admitting at 530 A.M.  Call this number if you have problems the morning of surgery:  [phone]   Remember:  Do not eat food or drink liquids after midnight.  Take these medicines the morning of surgery with A SIP OF WATER ---lexapro,flonase,synthtroid,ultram   Do not wear jewelry, make-up or nail polish.  Do not wear lotions, powders, or perfumes, or deoderant.  Do not shave 48 hours prior to surgery.  Men may shave face and neck.  Do not bring valuables to the hospital.  [facility] is not responsible for any belongings or valuables.  Contacts, dentures or bridgework may not be worn into surgery.  Leave your suitcase in the car.  After surgery it may be brought to your room.  For patients admitted to the hospital, discharge time will be determined by your treatment team.  Patients discharged the day of surgery will not be allowed to drive home.   Name and phone number of your driver:    Special instructions:  Do not take any aspirin,anti-inflammatories,vitamins,or herbal supplements 5-7 days prior to surgery.  Please read over the following fact sheets that you were given.

## 2016-04-23 ENCOUNTER — Other Ambulatory Visit: Payer: Self-pay | Admitting: Surgery

## 2016-04-23 NOTE — H&P (Signed)
  Lori Jones 04/03/2016 10:56 AM Location: [facility] Patient #: [id number] DOB: [birth year] Single / Language: Lori Jones / Race: Black or African American Female   History of Present Illness (Lori Galeno A. Ninfa Linden MD; 04/03/2016 11:28 AM) Patient words: Lori Jones.  The patient is a 56 year old female who presents with an incisional Jones. This is a pleasant patient that I performed a laparoscopic cholecystectomy on in June 2016 as well as a primary repair of a small Jones above the umbilicus. The Jones has recurred. I was not able to use mesh at the time of her original surgery because of an infected gallbladder. She reports some discomfort but no obstructive symptoms.   Allergies Lori Jones, CMA; 04/03/2016 10:56 AM) Lori Jones *MACROLIDES*  Imitrex *MIGRAINE PRODUCTS*   Medication History (Lori Jones, CMA; 04/03/2016 10:57 AM) Fluticasone Propionate (50MCG/ACT Suspension, Nasal) Active. Atorvastatin Calcium (40MG  Tablet, Oral) Active. Levothyroxine Sodium (25MCG Tablet, Oral) Active. MetFORMIN HCl (1000MG  Tablet, Oral) Active. Accu-Chek Aviva Plus (In Vitro) Active. Aspirin (81MG  Tablet, Oral) Active. TraMADol HCl (50MG  Tablet, Oral) Active. Medications Reconciled  Lori Jones (Lori Jones CMA; 04/03/2016 10:56 AM) 04/03/2016 10:56 AM Weight: 181.2 lb Height: 68in Body Surface Area: 1.96 m Body Mass Index: 27.55 kg/m  Temp.: 98.80F(Oral)  Pulse: 64 (Regular)  BP: 112/80 (Sitting, Left Arm, Standard)       Physical Exam (Lori Jones A. Ninfa Linden MD; 04/03/2016 11:29 AM) The physical exam findings are as follows: Note:On abdominal exam, her incisions are well-healed. There is a small, easily reducible Jones at the old scar above the umbilicus. It is nontender. Lungs clear CV RRR Generally well in appearance Skin without rash or reddness Ext without edema    Assessment & Plan (Lori Jones A. Ninfa Linden MD; 04/03/2016 11:29 AM) Lori Jones  Jones (K43.2) Impression: I discussed the diagnosis with her. She is interested in proceeding with Jones repair with mesh. I recommend this open as an outpatient with a small piece of mesh. I discussed the surgical procedure with her in detail. I discussed the risks of surgery which include but is not limited to bleeding, infection, recurrence, etc. She understands and wished to proceed with surgery

## 2016-04-24 ENCOUNTER — Ambulatory Visit (HOSPITAL_COMMUNITY): Payer: Medicare Other | Admitting: Anesthesiology

## 2016-04-24 ENCOUNTER — Encounter (HOSPITAL_COMMUNITY)
Admission: RE | Disposition: A | Discharge status: Home / Self Care | Payer: Self-pay | Source: Ambulatory Visit | Attending: Surgery

## 2016-04-24 ENCOUNTER — Ambulatory Visit (HOSPITAL_COMMUNITY)
Admission: RE | Admit: 2016-04-24 | Discharge: 2016-04-24 | Disposition: A | Discharge status: Home / Self Care | Payer: Medicare Other | Source: Ambulatory Visit | Attending: Surgery | Admitting: Surgery

## 2016-04-24 ENCOUNTER — Encounter (HOSPITAL_COMMUNITY): Payer: Self-pay | Admitting: Anesthesiology

## 2016-04-24 DIAGNOSIS — Z7982 Long term (current) use of aspirin: Secondary | ICD-10-CM | POA: Insufficient documentation

## 2016-04-24 DIAGNOSIS — I10 Essential (primary) hypertension: Secondary | ICD-10-CM | POA: Diagnosis not present

## 2016-04-24 DIAGNOSIS — M549 Dorsalgia, unspecified: Secondary | ICD-10-CM | POA: Diagnosis not present

## 2016-04-24 DIAGNOSIS — E039 Hypothyroidism, unspecified: Secondary | ICD-10-CM | POA: Diagnosis not present

## 2016-04-24 DIAGNOSIS — K432 Incisional hernia without obstruction or gangrene: Secondary | ICD-10-CM | POA: Diagnosis not present

## 2016-04-24 DIAGNOSIS — Z79899 Other long term (current) drug therapy: Secondary | ICD-10-CM | POA: Insufficient documentation

## 2016-04-24 HISTORY — PX: INCISIONAL HERNIA REPAIR: SHX193

## 2016-04-24 HISTORY — PX: INSERTION OF MESH: SHX5868

## 2016-04-24 LAB — GLUCOSE, CAPILLARY
GLUCOSE-CAPILLARY: 95 mg/dL (ref 65–99)
Glucose-Capillary: 95 mg/dL (ref 65–99)

## 2016-04-24 SURGERY — REPAIR, HERNIA, INCISIONAL
Anesthesia: General | Site: Abdomen

## 2016-04-24 MED ORDER — HYDROCODONE-ACETAMINOPHEN 5-325 MG PO TABS: 1.0000 | ORAL_TABLET | ORAL | 0 refills | Status: DC | PRN

## 2016-04-24 MED ORDER — MIDAZOLAM HCL 2 MG/2ML IJ SOLN
INTRAMUSCULAR | Status: AC
  Filled 2016-04-24: qty 2

## 2016-04-24 MED ORDER — LIDOCAINE HCL (CARDIAC) 20 MG/ML IV SOLN
INTRAVENOUS | Status: DC | PRN
  Administered 2016-04-24: 100 mg via INTRAVENOUS

## 2016-04-24 MED ORDER — 0.9 % SODIUM CHLORIDE (POUR BTL) OPTIME
TOPICAL | Status: DC | PRN
  Administered 2016-04-24: 1000 mL

## 2016-04-24 MED ORDER — CHLORHEXIDINE GLUCONATE CLOTH 2 % EX PADS: 6.0000 | MEDICATED_PAD | Freq: Once | CUTANEOUS | Status: DC

## 2016-04-24 MED ORDER — BUPIVACAINE HCL (PF) 0.25 % IJ SOLN
INTRAMUSCULAR | Status: AC
  Filled 2016-04-24: qty 30

## 2016-04-24 MED ORDER — MIDAZOLAM HCL 5 MG/5ML IJ SOLN
INTRAMUSCULAR | Status: DC | PRN
  Administered 2016-04-24: 2 mg via INTRAVENOUS

## 2016-04-24 MED ORDER — FENTANYL CITRATE (PF) 100 MCG/2ML IJ SOLN
INTRAMUSCULAR | Status: AC
  Filled 2016-04-24: qty 2

## 2016-04-24 MED ORDER — LACTATED RINGERS IV SOLN
INTRAVENOUS | Status: DC
  Administered 2016-04-24: 12:00:00 via INTRAVENOUS

## 2016-04-24 MED ORDER — ONDANSETRON HCL 4 MG/2ML IJ SOLN
INTRAMUSCULAR | Status: DC | PRN
  Administered 2016-04-24: 4 mg via INTRAVENOUS

## 2016-04-24 MED ORDER — PROPOFOL 10 MG/ML IV BOLUS
INTRAVENOUS | Status: DC | PRN
  Administered 2016-04-24: 10 mg via INTRAVENOUS

## 2016-04-24 MED ORDER — PHENYLEPHRINE HCL 10 MG/ML IJ SOLN
INTRAMUSCULAR | Status: DC | PRN
  Administered 2016-04-24 (×3): 80 ug via INTRAVENOUS

## 2016-04-24 MED ORDER — CEFAZOLIN SODIUM-DEXTROSE 2-4 GM/100ML-% IV SOLN: 2.0000 g | INTRAVENOUS | Status: DC

## 2016-04-24 MED ORDER — BUPIVACAINE HCL (PF) 0.25 % IJ SOLN
INTRAMUSCULAR | Status: DC | PRN
  Administered 2016-04-24: 20 mL

## 2016-04-24 MED ORDER — FENTANYL CITRATE (PF) 100 MCG/2ML IJ SOLN
INTRAMUSCULAR | Status: DC | PRN
  Administered 2016-04-24 (×2): 50 ug via INTRAVENOUS

## 2016-04-24 MED ORDER — FENTANYL CITRATE (PF) 100 MCG/2ML IJ SOLN
25.0000 ug | INTRAMUSCULAR | Status: DC | PRN
  Administered 2016-04-24 (×4): 25 ug via INTRAVENOUS

## 2016-04-24 MED ORDER — CEFAZOLIN SODIUM-DEXTROSE 2-4 GM/100ML-% IV SOLN
2.0000 g | INTRAVENOUS | Status: AC
  Administered 2016-04-24: 2 g via INTRAVENOUS
  Filled 2016-04-24: qty 100

## 2016-04-24 SURGICAL SUPPLY — 29 items
CANISTER SUCTION 2500CC (MISCELLANEOUS) ×2 IMPLANT
CHLORAPREP W/TINT 26ML (MISCELLANEOUS) ×2 IMPLANT
COVER SURGICAL LIGHT HANDLE (MISCELLANEOUS) ×2 IMPLANT
DECANTER SPIKE VIAL GLASS SM (MISCELLANEOUS) ×2 IMPLANT
DERMABOND ADVANCED (GAUZE/BANDAGES/DRESSINGS) ×1
DERMABOND ADVANCED .7 DNX12 (GAUZE/BANDAGES/DRESSINGS) ×1 IMPLANT
DRAPE LAPAROSCOPIC ABDOMINAL (DRAPES) ×2 IMPLANT
DRAPE UTILITY XL STRL (DRAPES) ×4 IMPLANT
ELECT CAUTERY BLADE 6.4 (BLADE) ×2 IMPLANT
ELECT REM PT RETURN 9FT ADLT (ELECTROSURGICAL) ×2
ELECTRODE REM PT RTRN 9FT ADLT (ELECTROSURGICAL) ×1 IMPLANT
GLOVE SURG SIGNA 7.5 PF LTX (GLOVE) ×2 IMPLANT
GOWN STRL REUS W/ TWL LRG LVL3 (GOWN DISPOSABLE) ×2 IMPLANT
GOWN STRL REUS W/ TWL XL LVL3 (GOWN DISPOSABLE) ×1 IMPLANT
GOWN STRL REUS W/TWL LRG LVL3 (GOWN DISPOSABLE) ×2
GOWN STRL REUS W/TWL XL LVL3 (GOWN DISPOSABLE) ×1
KIT BASIN OR (CUSTOM PROCEDURE TRAY) ×2 IMPLANT
KIT ROOM TURNOVER OR (KITS) ×2 IMPLANT
MESH VENTRALEX ST 1-7/10 CRC S (Mesh General) ×2 IMPLANT
NEEDLE HYPO 25GX1X1/2 BEV (NEEDLE) ×2 IMPLANT
NS IRRIG 1000ML POUR BTL (IV SOLUTION) ×2 IMPLANT
PACK GENERAL/GYN (CUSTOM PROCEDURE TRAY) ×2 IMPLANT
PAD ARMBOARD 7.5X6 YLW CONV (MISCELLANEOUS) ×2 IMPLANT
SUT MNCRL AB 4-0 PS2 18 (SUTURE) ×2 IMPLANT
SUT NOVA NAB DX-16 0-1 5-0 T12 (SUTURE) ×2 IMPLANT
SUT VIC AB 3-0 SH 27 (SUTURE) ×1
SUT VIC AB 3-0 SH 27XBRD (SUTURE) ×1 IMPLANT
SYR CONTROL 10ML LL (SYRINGE) ×2 IMPLANT
TOWEL OR 17X24 6PK STRL BLUE (TOWEL DISPOSABLE) ×2 IMPLANT

## 2016-04-24 NOTE — Interval H&P Note (Signed)
History and Physical Interval Note: no change in H and P  04/24/2016 12:29 PM  Lori Jones  has presented today for surgery, with the diagnosis of incisional hernia  The various methods of treatment have been discussed with the patient and family. After consideration of risks, benefits and other options for treatment, the patient has consented to  Procedure(s): HERNIA REPAIR INCISIONAL (N/A) INSERTION OF MESH (N/A) as a surgical intervention .  The patient's history has been reviewed, patient examined, no change in status, stable for surgery.  I have reviewed the patient's chart and labs.  Questions were answered to the patient's satisfaction.     Alette Kataoka A

## 2016-04-24 NOTE — Anesthesia Procedure Notes (Signed)
Procedure Name: LMA Insertion Date/Time: 04/24/2016 1:44 PM Performed by: Shirlyn Goltz Pre-anesthesia Checklist: Emergency Drugs available, Patient identified, Suction available and Patient being monitored Patient Re-evaluated:Patient Re-evaluated prior to inductionOxygen Delivery Method: Circle system utilized Preoxygenation: Pre-oxygenation with 100% oxygen Intubation Type: IV induction Ventilation: Mask ventilation without difficulty LMA: LMA inserted LMA Size: 4.0 Number of attempts: 1 Placement Confirmation: positive ETCO2 and breath sounds checked- equal and bilateral Tube secured with: Tape Dental Injury: Teeth and Oropharynx as per pre-operative assessment

## 2016-04-24 NOTE — Discharge Instructions (Signed)
[facility] _______[facility]  UMBILICAL OR INGUINAL HERNIA REPAIR: POST OP INSTRUCTIONS  Always review your discharge instruction sheet given to you by the facility where your surgery was performed. IF YOU HAVE DISABILITY OR FAMILY LEAVE FORMS, YOU MUST BRING THEM TO THE OFFICE FOR PROCESSING.   DO NOT GIVE THEM TO YOUR DOCTOR.  1. A  prescription for pain medication may be given to you upon discharge.  Take your pain medication as prescribed, if needed.  If narcotic pain medicine is not needed, then you may take acetaminophen (Tylenol) or ibuprofen (Advil) as needed. 2. Take your usually prescribed medications unless otherwise directed. If you need a refill on your pain medication, please contact your pharmacy.  They will contact our office to request authorization. Prescriptions will not be filled after 5 pm or on week-ends. 3. You should follow a light diet the first 24 hours after arrival home, such as soup and crackers, etc.  Be sure to include lots of fluids daily.  Resume your normal diet the day after surgery. 4.Most patients will experience some swelling and bruising around the umbilicus or in the groin and scrotum.  Ice packs and reclining will help.  Swelling and bruising can take several days to resolve.  6. It is common to experience some constipation if taking pain medication after surgery.  Increasing fluid intake and taking a stool softener (such as Colace) will usually help or prevent this problem from occurring.  A mild laxative (Milk of Magnesia or Miralax) should be taken according to package directions if there are no bowel movements after 48 hours. 7. Unless discharge instructions indicate otherwise, you may remove your bandages 24-48 hours after surgery, and you may shower at that time.  You may have steri-strips (small skin tapes) in place directly over the incision.  These strips should be left on the skin for 7-10 days.  If your surgeon used skin glue on the  incision, you may shower in 24 hours.  The glue will flake off over the next 2-3 weeks.  Any sutures or staples will be removed at the office during your follow-up visit. 8. ACTIVITIES:  You may resume regular (light) daily activities beginning the next day--such as daily self-care, walking, climbing stairs--gradually increasing activities as tolerated.  You may have sexual intercourse when it is comfortable.  Refrain from any heavy lifting or straining until approved by your doctor.  a.You may drive when you are no longer taking prescription pain medication, you can comfortably wear a seatbelt, and you can safely maneuver your car and apply brakes. b.RETURN TO WORK:   _____________________________________________  9.You should see your doctor in the office for a follow-up appointment approximately 2-3 weeks after your surgery.  Make sure that you call for this appointment within a day or two after you arrive home to insure a convenient appointment time. 10.OTHER INSTRUCTIONS: __NO LIFTING MORE THAN 15 POUNDS FOR 4 WEEKS IBUPROFEN AND ICE PACK ALSO FOR PAIN_______________________    _____________________________________  WHEN TO CALL YOUR DOCTOR: 1. Fever over 101.0 2. Inability to urinate 3. Nausea and/or vomiting 4. Extreme swelling or bruising 5. Continued bleeding from incision. 6. Increased pain, redness, or drainage from the incision  The clinic staff is available to answer your questions during regular business hours.  Please dont hesitate to call and ask to speak to one of the nurses for clinical concerns.  If you have a medical emergency, go to the nearest emergency room or call 911.  A surgeon from [facility] is always on call at the hospital   [address] [place], [place], [place]  [address] ?  [address], [place], [place]   [address] [phone] ? [phone] ? FAX [phone] Web site: [email or web address]

## 2016-04-24 NOTE — Op Note (Signed)
HERNIA REPAIR INCISIONAL, INSERTION OF MESH  Procedure Note  Lori Jones 04/24/2016   Pre-op Diagnosis: Incisional Hernia     Post-op Diagnosis: same  Procedure(s): HERNIA REPAIR INCISIONAL INSERTION OF MESH (4.3 cm round ventral lite ST)  Surgeon(s): Coralie Keens, MD  Anesthesia: General  Staff:  Circulator: Tereasa Coop, RN Scrub Person: Mallie Snooks, RN; Jim Like Leggio  Estimated Blood Loss: Minimal               Findings: The patient was found to have a small fascial defect in the upper abdomen. It was approximately 1 cm in size. It was repaired with a 4.3 cm round ventral lite ST patch from [facility]  Procedure: The patient was brought to the upper abdomen to identify the correct patient. She was placed supine on the operating table and general anesthesia was induced. Her abdomen was then prepped and draped in the usual sterile fashion. I anesthetized skin above the umbilicus at the previous scar with Marcaine. I then made a longitudinal incision with the scalpel. I think this down to the hernia sac which was easily identified. All contents were reduced and I excised the redundant sac down to the fascia. I then cleared the surrounding fascia bluntly. I next brought a 4.3 cm ventral patch onto the field. I placed it to the fascia opening and pulled against the peritoneum with the stay ties. I then sewed the mesh in place circumferentially with #1 Novafil sutures. I then cut the stay ties and closed the fascia over the top of the mesh with a figure-of-eight #1 Novafil suture. I then anesthetized the fascia and skin further with Marcaine. I closed subjacent tissue with interrupted 3-0 Vicryl sutures and closed the skin with a running 4-0 Monocryl. Skin glue was then applied. The patient tolerated procedure well. All the counts were correct at the end of the procedure. The patient was then extubated in the operating room and taken in a stable condition to the recovery room.           Lori Jones A   Date: 04/24/2016  Time: 2:11 PM

## 2016-04-24 NOTE — Transfer of Care (Signed)
Immediate Anesthesia Transfer of Care Note  Patient: Lori Jones  Procedure(s) Performed: Procedure(s): HERNIA REPAIR INCISIONAL (N/A) INSERTION OF MESH (N/A)  Patient Location: PACU  Anesthesia Type:General  Level of Consciousness: awake, alert , oriented and patient cooperative  Airway & Oxygen Therapy: Patient Spontanous Breathing and Patient connected to face mask oxygen  Post-op Assessment: Report given to RN and Post -op Vital signs reviewed and stable  Post vital signs: Reviewed and stable  Last Vitals:  Vitals:   04/24/16 1151  BP: 135/76  Pulse: (!) 59  Resp: 18  Temp: 37 C    Last Pain:  Vitals:   04/24/16 1151  TempSrc: Oral         Complications: No apparent anesthesia complications

## 2016-04-24 NOTE — Anesthesia Preprocedure Evaluation (Addendum)
Anesthesia Evaluation  Patient identified by MRN, date of birth, ID band Patient awake    Reviewed: Allergy & Precautions, NPO status , Patient's Chart, lab work & pertinent test results  Airway Mallampati: II  TM Distance: >3 FB Neck ROM: Full    Dental no notable dental hx.    Pulmonary asthma ,    Pulmonary exam normal breath sounds clear to auscultation       Cardiovascular Exercise Tolerance: Good hypertension, Pt. on medications + angina Normal cardiovascular exam+ Valvular Problems/Murmurs  Rhythm:Regular Rate:Normal     Neuro/Psych Seizures -,  PSYCHIATRIC DISORDERS Anxiety Depression CVA    GI/Hepatic negative GI ROS, Neg liver ROS,   Endo/Other  diabetes, Type 2, Oral Hypoglycemic AgentsHypothyroidism   Renal/GU Renal disease  negative genitourinary   Musculoskeletal  (+) Arthritis ,   Abdominal   Peds negative pediatric ROS (+)  Hematology  (+) anemia ,   Anesthesia Other Findings Asthma as child No meds for HTN Fair dentition  Reproductive/Obstetrics negative OB ROS                            Anesthesia Physical  Anesthesia Plan  ASA: III  Anesthesia Plan: General   Post-op Pain Management:    Induction: Intravenous  Airway Management Planned: Oral ETT  Additional Equipment:   Intra-op Plan:   Post-operative Plan: Extubation in OR  Informed Consent: I have reviewed the patients History and Physical, chart, labs and discussed the procedure including the risks, benefits and alternatives for the proposed anesthesia with the patient or authorized representative who has indicated his/her understanding and acceptance.   Dental advisory given  Plan Discussed with: CRNA  Anesthesia Plan Comments:         Anesthesia Quick Evaluation

## 2016-04-24 NOTE — Anesthesia Postprocedure Evaluation (Signed)
Anesthesia Post Note  Patient: Lori Jones  Procedure(s) Performed: Procedure(s) (LRB): HERNIA REPAIR INCISIONAL (N/A) INSERTION OF MESH (N/A)  Patient location during evaluation: PACU Anesthesia Type: General Level of consciousness: awake and alert Pain management: pain level controlled Vital Signs Assessment: post-procedure vital signs reviewed and stable Respiratory status: spontaneous breathing, nonlabored ventilation, respiratory function stable and patient connected to nasal cannula oxygen Cardiovascular status: blood pressure returned to baseline and stable Postop Assessment: no signs of nausea or vomiting Anesthetic complications: no       Last Vitals:  Vitals:   04/24/16 1535 04/24/16 1545  BP: 113/73   Pulse: (!) 55 65  Resp: 10 14  Temp:      Last Pain:  Vitals:   04/24/16 1515  TempSrc:   PainSc: Asleep                 Lilyth Lawyer EDWARD

## 2016-04-25 ENCOUNTER — Encounter (HOSPITAL_COMMUNITY): Payer: Self-pay | Admitting: Surgery

## 2016-05-03 ENCOUNTER — Encounter (HOSPITAL_COMMUNITY): Payer: Self-pay

## 2016-05-03 ENCOUNTER — Emergency Department (HOSPITAL_COMMUNITY)
Admission: EM | Admit: 2016-05-03 | Discharge: 2016-05-04 | Disposition: A | Discharge status: Home / Self Care | Payer: Medicare Other | Attending: Emergency Medicine | Admitting: Emergency Medicine

## 2016-05-03 DIAGNOSIS — Z8673 Personal history of transient ischemic attack (TIA), and cerebral infarction without residual deficits: Secondary | ICD-10-CM | POA: Insufficient documentation

## 2016-05-03 DIAGNOSIS — Z7982 Long term (current) use of aspirin: Secondary | ICD-10-CM | POA: Diagnosis not present

## 2016-05-03 DIAGNOSIS — J45909 Unspecified asthma, uncomplicated: Secondary | ICD-10-CM | POA: Insufficient documentation

## 2016-05-03 DIAGNOSIS — L253 Unspecified contact dermatitis due to other chemical products: Secondary | ICD-10-CM | POA: Insufficient documentation

## 2016-05-03 DIAGNOSIS — E119 Type 2 diabetes mellitus without complications: Secondary | ICD-10-CM | POA: Diagnosis not present

## 2016-05-03 DIAGNOSIS — E039 Hypothyroidism, unspecified: Secondary | ICD-10-CM | POA: Insufficient documentation

## 2016-05-03 DIAGNOSIS — I10 Essential (primary) hypertension: Secondary | ICD-10-CM | POA: Diagnosis not present

## 2016-05-03 DIAGNOSIS — R21 Rash and other nonspecific skin eruption: Secondary | ICD-10-CM | POA: Diagnosis present

## 2016-05-03 DIAGNOSIS — Z7984 Long term (current) use of oral hypoglycemic drugs: Secondary | ICD-10-CM | POA: Insufficient documentation

## 2016-05-03 DIAGNOSIS — Z79899 Other long term (current) drug therapy: Secondary | ICD-10-CM | POA: Diagnosis not present

## 2016-05-03 LAB — CBC WITH DIFFERENTIAL/PLATELET
BASOS ABS: 0 10*3/uL (ref 0.0–0.1)
BASOS PCT: 0 %
EOS ABS: 0.5 10*3/uL (ref 0.0–0.7)
Eosinophils Relative: 9 %
HEMATOCRIT: 32.3 % — AB (ref 36.0–46.0)
HEMOGLOBIN: 10.5 g/dL — AB (ref 12.0–15.0)
Lymphocytes Relative: 53 %
Lymphs Abs: 2.8 10*3/uL (ref 0.7–4.0)
MCH: 26.4 pg (ref 26.0–34.0)
MCHC: 32.5 g/dL (ref 30.0–36.0)
MCV: 81.2 fL (ref 78.0–100.0)
Monocytes Absolute: 0.4 10*3/uL (ref 0.1–1.0)
Monocytes Relative: 7 %
NEUTROS ABS: 1.7 10*3/uL (ref 1.7–7.7)
NEUTROS PCT: 31 %
Platelets: 220 10*3/uL (ref 150–400)
RBC: 3.98 MIL/uL (ref 3.87–5.11)
RDW: 12.9 % (ref 11.5–15.5)
WBC: 5.3 10*3/uL (ref 4.0–10.5)

## 2016-05-03 LAB — COMPREHENSIVE METABOLIC PANEL
ALK PHOS: 69 U/L (ref 38–126)
ALT: 22 U/L (ref 14–54)
ANION GAP: 9 (ref 5–15)
AST: 23 U/L (ref 15–41)
Albumin: 3.7 g/dL (ref 3.5–5.0)
BUN: 12 mg/dL (ref 6–20)
CALCIUM: 8.9 mg/dL (ref 8.9–10.3)
CO2: 25 mmol/L (ref 22–32)
CREATININE: 0.71 mg/dL (ref 0.44–1.00)
Chloride: 105 mmol/L (ref 101–111)
GFR calc Af Amer: >60 mL/min (ref >60)
Glucose, Bld: 137 mg/dL — ABNORMAL HIGH (ref 65–99)
Potassium: 3.7 mmol/L (ref 3.5–5.1)
SODIUM: 139 mmol/L (ref 135–145)
Total Bilirubin: 0.5 mg/dL (ref 0.3–1.2)
Total Protein: 7.1 g/dL (ref 6.5–8.1)

## 2016-05-03 NOTE — ED Triage Notes (Addendum)
PT C/O A POSSIBLE WOUND INFECTION THE THE MID ABDOMEN. PT STS SHE IS S/P ABDOMINAL HERNIA REPAIR ON 1/24. PT STS SHE BROKE OUT IN A RASH FROM THE SKIN CLEANSER AFTER THE SURGERY, BUT IT SEEMS LIKE THE RASH IS GETTING WORSE. PT STS INCISION IS VERY SENSITIVE, RED, AND NOW HAS DRAINAGE. DENIES FEVER, N/V/D.
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
## 2016-05-15 ENCOUNTER — Ambulatory Visit (INDEPENDENT_AMBULATORY_CARE_PROVIDER_SITE_OTHER): Payer: Medicare Other | Admitting: Family Medicine

## 2016-05-15 ENCOUNTER — Encounter: Payer: Self-pay | Admitting: Family Medicine

## 2016-05-15 DIAGNOSIS — R21 Rash and other nonspecific skin eruption: Secondary | ICD-10-CM | POA: Insufficient documentation

## 2016-05-15 MED ORDER — PREDNISONE 10 MG PO TABS: ORAL_TABLET | ORAL | 0 refills | Status: DC

## 2016-05-15 NOTE — Patient Instructions (Signed)
Thank you for coming in today, it was so nice to see you! Today we talked about:    Rash: This is likely an allergic reaction to something  Take 2 pills of prednisone (20 mg daily )For the next 3 days, then take 1 pill (10 mg) daily until you see your surgeon  Continue using the cream if the pills are not taking the itch away   Reason to go to the hospital : Fever, chills, rash spreads to other places other than your abdomen, nausea, vomiting If you have any questions or concerns, please do not hesitate to call the office at [phone]. You can also message me directly via MyChart.   Sincerely,  Smitty Cords, MD

## 2016-05-15 NOTE — Progress Notes (Signed)
   Subjective:    Patient ID: Lori Jones , female   DOB: [birth year] , 56 y.o..   MRN: [id number]  HPI  Lori Jones is here for a same day visit for :  RASH  Location: abdomen    Course: She is status post abdominal hernia repair on January 24. Monday following the surgery she developed a rash to her abdomen that was burning and itching in nature. She went to the ED on 2/02 and was told to use hydrocortisone cream at that time.  Self-treated with: Hydrocortisone cream              Improvement with treatment: temporary  History Pruritis: yes  Tenderness: no  New medications/antibiotics: no  Tick/insect/pet exposure: no  Recent travel: no  New detergent, new clothing, or other topical exposure: no   Red Flags Feeling ill: no  Fever: no  Mouth lesions: no  Facial/tongue swelling/difficulty breathing:  no  Diabetic or immunocompromised: diabetic   Review of Systems: Per HPI. All other systems reviewed and are negative.   Social Hx:  reports that she has never smoked. She has never used smokeless tobacco.   Objective:   BP 128/72   Pulse 76   Temp 98.4 F (36.9 C) (Oral)   Ht 5\' 8"  (1.727 m)   Wt 180 lb 9.6 oz (81.9 kg)   SpO2 98%   BMI 27.46 kg/m  Physical Exam  Gen: NAD, alert, cooperative with exam, well-appearing Skin: central crusting where abdominal hernia repair was. Erythematous pruritic papules surrounding crusted area on abdomen, no papules anywhere else on body besides abdomen      Assessment & Plan:  Rash and nonspecific skin eruption Abdominal rash, exam most consistent with allergic reaction after ventral abdominal hernia repair. Unclear what exactly patient had an allergic reaction to. No signs of systemic infection. Is getting some temporary relief with Hydrocortisone ointment. Discussed patient with Dr. Nori Riis who also examined patient and the plan is as follows - Continue Hydrocortisone ointment PRN - Will give Prednisone 20 mg x 3 days  followed by Prednisone 10 mg daily until patient can follow up with her surgeon - Will forward this note to patient's surgeon   Smitty Cords, MD [place], PGY-2

## 2016-05-18 NOTE — Assessment & Plan Note (Addendum)
Abdominal rash, exam most consistent with allergic reaction after ventral abdominal hernia repair. Unclear what exactly patient had an allergic reaction to. No signs of systemic infection. Is getting some temporary relief with Hydrocortisone ointment. Discussed patient with Dr. Nori Riis who also examined patient and the plan is as follows - Continue Hydrocortisone ointment PRN - Will give Prednisone 20 mg x 3 days followed by Prednisone 10 mg daily until patient can follow up with her surgeon - Will forward this note to patient's surgeon

## 2016-05-21 DIAGNOSIS — N83291 Other ovarian cyst, right side: Secondary | ICD-10-CM | POA: Diagnosis not present

## 2016-05-24 ENCOUNTER — Other Ambulatory Visit: Payer: Self-pay | Admitting: Family Medicine

## 2016-05-29 ENCOUNTER — Encounter: Payer: Self-pay | Admitting: Student

## 2016-05-29 ENCOUNTER — Ambulatory Visit (INDEPENDENT_AMBULATORY_CARE_PROVIDER_SITE_OTHER): Payer: Medicare Other | Admitting: Student

## 2016-05-29 VITALS — BP 110/64 | HR 86 | Temp 97.9°F | Ht 68.0 in | Wt 179.2 lb

## 2016-05-29 DIAGNOSIS — R0981 Nasal congestion: Secondary | ICD-10-CM | POA: Diagnosis present

## 2016-05-29 DIAGNOSIS — B9789 Other viral agents as the cause of diseases classified elsewhere: Secondary | ICD-10-CM

## 2016-05-29 DIAGNOSIS — J069 Acute upper respiratory infection, unspecified: Secondary | ICD-10-CM

## 2016-05-29 MED ORDER — PSEUDOEPHEDRINE HCL ER 120 MG PO TB12: 120.0000 mg | ORAL_TABLET | Freq: Two times a day (BID) | ORAL | 0 refills | Status: AC

## 2016-05-29 NOTE — Patient Instructions (Addendum)
It appears that you have a viral upper respiratory infection (Common Cold).  Cold symptoms can last up to 2 weeks.    -Fill the prescription and start taking it for congestion - A tablespoonful of honey before bedtime is helpful for cough - Get plenty of rest and adequate hydration. - Consume warm fluids (soup or tea) to provide relief for a stuffy nose and to loosen phlegm. - For nasal stuffiness, try saline nasal spray or a Neti Pot. Eating warm liquids such as chicken soup or tea may also help with nasal congestion. - For sore throat pain relief: suck on throat lozenges, hard candy or popsicles; gargle with warm salt water (1/4 tsp. salt per 8 oz. of water); and eat soft, bland foods. - Eat a well-balanced diet. If you cannot, ensure you are getting enough nutrients by taking a daily multivitamin. - Avoid dairy products, as they can thicken phlegm. - Avoid alcohol, as it impairs your body's immune system.  CONTACT YOUR DOCTOR IF YOU EXPERIENCE ANY OF THE FOLLOWING: - High fever, chest pain, shortness of breath or  not able to keep down food or fluids.  - Cough that gets worse while other cold symptoms improve - Flare up of any chronic lung problem, such as asthma - Your symptoms persist longer than 2 weeks
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
## 2016-05-29 NOTE — Assessment & Plan Note (Signed)
History and exam suggestive for viral URTI. She has rhinorrhea, erythema of the turbinates and congestion in her nose. Mild tenderness to palpation over his frontal or maxillary sinuses bilaterally. However, she have symptoms only for 4 days and transformation test negative to treat for sinusitis with antibiotic. She also history of seasonal allergy and not using her Flonase as she is supposed to.  Lung exam normal. Overall, she appears well without respiratory distress.  -Gave prescription for pseudoephedrine 120 mg twice a day, #6 tablets -Recommended using her Flonase daily. Discussed about the appropriate use -Recommended conservative management such as adequate hydration and a tablespoonful of honey before bedtime for cough -Discussed return precautions including but not limited to shortness of breath or increased working of breathing, severe persistent cough,  persistent fever over 101F, not tolerating fluids by mouth or other symptoms concerning to here

## 2016-06-02 ENCOUNTER — Encounter (HOSPITAL_COMMUNITY): Payer: Self-pay

## 2016-06-02 ENCOUNTER — Emergency Department (HOSPITAL_COMMUNITY)
Admission: EM | Admit: 2016-06-02 | Discharge: 2016-06-02 | Disposition: A | Discharge status: Home / Self Care | Payer: Medicare Other | Attending: Emergency Medicine | Admitting: Emergency Medicine

## 2016-06-02 ENCOUNTER — Other Ambulatory Visit: Payer: Self-pay | Admitting: Family Medicine

## 2016-06-02 ENCOUNTER — Emergency Department (HOSPITAL_COMMUNITY): Payer: Medicare Other

## 2016-06-02 DIAGNOSIS — J0100 Acute maxillary sinusitis, unspecified: Secondary | ICD-10-CM | POA: Insufficient documentation

## 2016-06-02 DIAGNOSIS — J45909 Unspecified asthma, uncomplicated: Secondary | ICD-10-CM | POA: Diagnosis not present

## 2016-06-02 DIAGNOSIS — R0981 Nasal congestion: Secondary | ICD-10-CM | POA: Diagnosis present

## 2016-06-02 DIAGNOSIS — Z7984 Long term (current) use of oral hypoglycemic drugs: Secondary | ICD-10-CM | POA: Diagnosis not present

## 2016-06-02 DIAGNOSIS — E039 Hypothyroidism, unspecified: Secondary | ICD-10-CM | POA: Diagnosis not present

## 2016-06-02 DIAGNOSIS — I10 Essential (primary) hypertension: Secondary | ICD-10-CM | POA: Diagnosis not present

## 2016-06-02 DIAGNOSIS — Z7982 Long term (current) use of aspirin: Secondary | ICD-10-CM | POA: Insufficient documentation

## 2016-06-02 DIAGNOSIS — E119 Type 2 diabetes mellitus without complications: Secondary | ICD-10-CM | POA: Diagnosis not present

## 2016-06-02 DIAGNOSIS — J019 Acute sinusitis, unspecified: Secondary | ICD-10-CM | POA: Diagnosis not present

## 2016-06-02 DIAGNOSIS — J0110 Acute frontal sinusitis, unspecified: Secondary | ICD-10-CM | POA: Diagnosis not present

## 2016-06-02 DIAGNOSIS — E089 Diabetes mellitus due to underlying condition without complications: Secondary | ICD-10-CM

## 2016-06-02 DIAGNOSIS — R05 Cough: Secondary | ICD-10-CM | POA: Diagnosis not present

## 2016-06-02 MED ORDER — AMOXICILLIN-POT CLAVULANATE 875-125 MG PO TABS: 1.0000 | ORAL_TABLET | Freq: Two times a day (BID) | ORAL | 0 refills | Status: DC

## 2016-06-02 MED ORDER — GUAIFENESIN-CODEINE 100-10 MG/5ML PO SOLN: 5.0000 mL | Freq: Three times a day (TID) | ORAL | 0 refills | Status: DC | PRN

## 2016-06-02 MED ORDER — BENZONATATE 100 MG PO CAPS
100.0000 mg | ORAL_CAPSULE | Freq: Once | ORAL | Status: AC
  Administered 2016-06-02: 100 mg via ORAL
  Filled 2016-06-02: qty 1

## 2016-06-02 NOTE — Discharge Instructions (Signed)
Please take the antibiotics as prescribed. Stop taking the Sudafed. Continue her Flonase. Have given a cough medicine this will make her drowsy so do not drive with that. Saline flushes to clear out nasal cavity. Follow-up with primary care doctor. Return to the ED for symptoms worsen.
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
## 2016-06-02 NOTE — ED Triage Notes (Signed)
Pt states that she has been experiencing congestion, sinus pressure, and sinus headache since last Sunday. She was seen by her doctor and was given Sudafed, but she states that it is not helping. A&Ox4.

## 2016-06-04 ENCOUNTER — Telehealth: Payer: Self-pay | Admitting: Family Medicine

## 2016-06-04 NOTE — Telephone Encounter (Signed)
Needs refill on some type of cough medicine. She was given guaifenesin in the ED on Sunday but insurance didn't cover it.  She would like to have a cough medicine called in that will be covered by insurance.  [place]

## 2016-06-04 NOTE — Telephone Encounter (Signed)
Patient will need to contact her insurance company to see what they will cover.  RN does not have patient's pharmacy insurance on file.  Derl Barrow, RN
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
## 2016-06-14 ENCOUNTER — Ambulatory Visit (INDEPENDENT_AMBULATORY_CARE_PROVIDER_SITE_OTHER): Payer: Medicare Other | Admitting: Family Medicine

## 2016-06-14 ENCOUNTER — Encounter: Payer: Self-pay | Admitting: Family Medicine

## 2016-06-14 VITALS — BP 120/82 | HR 70 | Temp 97.7°F | Ht 68.0 in | Wt 178.6 lb

## 2016-06-14 DIAGNOSIS — H401131 Primary open-angle glaucoma, bilateral, mild stage: Secondary | ICD-10-CM | POA: Diagnosis not present

## 2016-06-14 DIAGNOSIS — E119 Type 2 diabetes mellitus without complications: Secondary | ICD-10-CM

## 2016-06-14 DIAGNOSIS — E089 Diabetes mellitus due to underlying condition without complications: Secondary | ICD-10-CM | POA: Diagnosis not present

## 2016-06-14 DIAGNOSIS — H25813 Combined forms of age-related cataract, bilateral: Secondary | ICD-10-CM | POA: Diagnosis not present

## 2016-06-14 LAB — POCT GLYCOSYLATED HEMOGLOBIN (HGB A1C): HEMOGLOBIN A1C: 6.5

## 2016-06-14 NOTE — Assessment & Plan Note (Addendum)
Controlled. A1c goal less than 7. A1c today 6.5. Getting her eye exam today at ophthalmologist. -Continue metformin 1000 mg twice a day -Encouraged exercise at least 3 times a week -Follow-up in 3 months

## 2016-06-14 NOTE — Patient Instructions (Addendum)
Thank you for coming in today, it was so nice to see you! Today we talked about:    Diabetes: You are doing great! Your A1C is 6.5. Keep up the good work and continue to increase your exercise.   I have filled out your handicap sticker for you.   Please follow up in 3-6 months or sooner if needed. You can schedule this appointment at the front desk before you leave or call the clinic.  Bring in all your medications or supplements to each appointment for review.   If you have any questions or concerns, please do not hesitate to call the office at [phone]. You can also message me directly via MyChart.   Sincerely,  Smitty Cords, MD

## 2016-07-10 ENCOUNTER — Encounter: Payer: Medicare Other | Admitting: Family Medicine

## 2016-08-20 ENCOUNTER — Other Ambulatory Visit: Payer: Self-pay | Admitting: Family Medicine

## 2016-08-30 ENCOUNTER — Other Ambulatory Visit: Payer: Self-pay | Admitting: Family Medicine

## 2016-08-30 DIAGNOSIS — E089 Diabetes mellitus due to underlying condition without complications: Secondary | ICD-10-CM

## 2016-10-04 ENCOUNTER — Observation Stay (HOSPITAL_COMMUNITY)
Admission: EM | Admit: 2016-10-04 | Discharge: 2016-10-05 | Disposition: A | Discharge status: Home / Self Care | Payer: Medicare Other | Attending: Family Medicine | Admitting: Family Medicine

## 2016-10-04 ENCOUNTER — Encounter (HOSPITAL_COMMUNITY): Payer: Self-pay

## 2016-10-04 ENCOUNTER — Emergency Department (HOSPITAL_COMMUNITY): Payer: Medicare Other

## 2016-10-04 DIAGNOSIS — J45909 Unspecified asthma, uncomplicated: Secondary | ICD-10-CM | POA: Insufficient documentation

## 2016-10-04 DIAGNOSIS — E119 Type 2 diabetes mellitus without complications: Secondary | ICD-10-CM | POA: Diagnosis not present

## 2016-10-04 DIAGNOSIS — Z7984 Long term (current) use of oral hypoglycemic drugs: Secondary | ICD-10-CM | POA: Diagnosis not present

## 2016-10-04 DIAGNOSIS — F329 Major depressive disorder, single episode, unspecified: Secondary | ICD-10-CM | POA: Diagnosis not present

## 2016-10-04 DIAGNOSIS — I10 Essential (primary) hypertension: Secondary | ICD-10-CM | POA: Diagnosis not present

## 2016-10-04 DIAGNOSIS — Z79899 Other long term (current) drug therapy: Secondary | ICD-10-CM | POA: Insufficient documentation

## 2016-10-04 DIAGNOSIS — R011 Cardiac murmur, unspecified: Secondary | ICD-10-CM | POA: Diagnosis not present

## 2016-10-04 DIAGNOSIS — Z8673 Personal history of transient ischemic attack (TIA), and cerebral infarction without residual deficits: Secondary | ICD-10-CM | POA: Diagnosis not present

## 2016-10-04 DIAGNOSIS — I200 Unstable angina: Secondary | ICD-10-CM

## 2016-10-04 DIAGNOSIS — R079 Chest pain, unspecified: Secondary | ICD-10-CM | POA: Diagnosis not present

## 2016-10-04 DIAGNOSIS — Z7982 Long term (current) use of aspirin: Secondary | ICD-10-CM | POA: Insufficient documentation

## 2016-10-04 DIAGNOSIS — E785 Hyperlipidemia, unspecified: Secondary | ICD-10-CM | POA: Insufficient documentation

## 2016-10-04 DIAGNOSIS — R0789 Other chest pain: Principal | ICD-10-CM | POA: Diagnosis present

## 2016-10-04 DIAGNOSIS — F419 Anxiety disorder, unspecified: Secondary | ICD-10-CM | POA: Diagnosis not present

## 2016-10-04 DIAGNOSIS — E039 Hypothyroidism, unspecified: Secondary | ICD-10-CM | POA: Diagnosis not present

## 2016-10-04 DIAGNOSIS — I700 Atherosclerosis of aorta: Secondary | ICD-10-CM | POA: Insufficient documentation

## 2016-10-04 LAB — BASIC METABOLIC PANEL
Anion gap: 9 (ref 5–15)
BUN: 14 mg/dL (ref 6–20)
CO2: 25 mmol/L (ref 22–32)
Calcium: 9.4 mg/dL (ref 8.9–10.3)
Chloride: 107 mmol/L (ref 101–111)
Creatinine, Ser: 0.78 mg/dL (ref 0.44–1.00)
GFR calc Af Amer: >60 mL/min (ref >60)
GLUCOSE: 108 mg/dL — AB (ref 65–99)
POTASSIUM: 3.5 mmol/L (ref 3.5–5.1)
Sodium: 141 mmol/L (ref 135–145)

## 2016-10-04 LAB — CBC
HEMATOCRIT: 37.9 % (ref 36.0–46.0)
Hemoglobin: 12.3 g/dL (ref 12.0–15.0)
MCH: 26.3 pg (ref 26.0–34.0)
MCHC: 32.5 g/dL (ref 30.0–36.0)
MCV: 81 fL (ref 78.0–100.0)
Platelets: 216 10*3/uL (ref 150–400)
RBC: 4.68 MIL/uL (ref 3.87–5.11)
RDW: 13.3 % (ref 11.5–15.5)
WBC: 4.7 10*3/uL (ref 4.0–10.5)

## 2016-10-04 LAB — D-DIMER, QUANTITATIVE: D-Dimer, Quant: 0.3 ug/mL-FEU (ref 0.00–0.50)

## 2016-10-04 LAB — I-STAT TROPONIN, ED: Troponin i, poc: 0 ng/mL (ref 0.00–0.08)

## 2016-10-04 MED ORDER — ATORVASTATIN CALCIUM 40 MG PO TABS
40.0000 mg | ORAL_TABLET | Freq: Every day | ORAL | Status: DC
  Administered 2016-10-05: 40 mg via ORAL
  Filled 2016-10-04: qty 1

## 2016-10-04 MED ORDER — ONDANSETRON HCL 4 MG/2ML IJ SOLN: 4.0000 mg | Freq: Four times a day (QID) | INTRAMUSCULAR | Status: DC | PRN

## 2016-10-04 MED ORDER — LEVOTHYROXINE SODIUM 25 MCG PO TABS
25.0000 ug | ORAL_TABLET | Freq: Every day | ORAL | Status: DC
  Administered 2016-10-05: 25 ug via ORAL
  Filled 2016-10-04: qty 1

## 2016-10-04 MED ORDER — ONDANSETRON HCL 4 MG PO TABS: 4.0000 mg | ORAL_TABLET | Freq: Four times a day (QID) | ORAL | Status: DC | PRN

## 2016-10-04 MED ORDER — ACETAMINOPHEN 325 MG PO TABS: 650.0000 mg | ORAL_TABLET | ORAL | Status: DC | PRN

## 2016-10-04 MED ORDER — INSULIN ASPART 100 UNIT/ML ~~LOC~~ SOLN: 0.0000 [IU] | Freq: Three times a day (TID) | SUBCUTANEOUS | Status: DC

## 2016-10-04 MED ORDER — ASPIRIN EC 81 MG PO TBEC
81.0000 mg | DELAYED_RELEASE_TABLET | Freq: Every day | ORAL | Status: DC
  Administered 2016-10-05: 81 mg via ORAL
  Filled 2016-10-04: qty 1

## 2016-10-04 MED ORDER — NITROGLYCERIN 0.4 MG SL SUBL
0.4000 mg | SUBLINGUAL_TABLET | SUBLINGUAL | Status: DC | PRN
  Administered 2016-10-04: 0.4 mg via SUBLINGUAL
  Filled 2016-10-04: qty 1

## 2016-10-04 MED ORDER — ASPIRIN 81 MG PO CHEW
243.0000 mg | CHEWABLE_TABLET | Freq: Once | ORAL | Status: AC
  Administered 2016-10-04: 243 mg via ORAL
  Filled 2016-10-04: qty 3

## 2016-10-04 MED ORDER — POLYETHYLENE GLYCOL 3350 17 G PO PACK: 17.0000 g | PACK | Freq: Every day | ORAL | Status: DC | PRN

## 2016-10-04 MED ORDER — ENOXAPARIN SODIUM 40 MG/0.4ML ~~LOC~~ SOLN
40.0000 mg | SUBCUTANEOUS | Status: DC
  Administered 2016-10-05: 40 mg via SUBCUTANEOUS
  Filled 2016-10-04: qty 0.4

## 2016-10-04 MED ORDER — SODIUM CHLORIDE 0.9% FLUSH
3.0000 mL | Freq: Two times a day (BID) | INTRAVENOUS | Status: DC
  Administered 2016-10-05: 3 mL via INTRAVENOUS

## 2016-10-04 NOTE — ED Notes (Signed)
[facility] called for transport. Attempted to give report to [facility] but nurse was busy. Instructed to call back for report.

## 2016-10-04 NOTE — ED Triage Notes (Signed)
Patient c/o intermittent mid chest pain x 1 week. Patient states the chest pain is worse today and c/o SOB. Patient states chest pain started when she was on an elevator a week ago and the elevator fell from the 10th floor to the basement. Patient states difficulty breathing.
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
## 2016-10-04 NOTE — ED Provider Notes (Signed)
Medical screening examination/treatment/procedure(s) were conducted as a shared visit with non-physician practitioner(s) and myself.  I personally evaluated the patient during the encounter.   EKG Interpretation  Date/Time:  Friday October 04 2016 17:20:14 EDT Ventricular Rate:  68 PR Interval:    QRS Duration: 92 QT Interval:  417 QTC Calculation: 444 R Axis:   40 Text Interpretation:  Sinus rhythm Abnormal R-wave progression, early transition No significant change since last tracing Confirmed by Lacretia Leigh ([id number]) on 10/04/2016 7:25:78 PM     56 year old female presents with exertional chest pain. Pain is also worse with stress and better with coming down and resting. EKG is unchanged. D-dimer negative. Troponin negative. Will admit for further management   Lacretia Leigh, MD 10/04/16 1931
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
## 2016-10-05 ENCOUNTER — Other Ambulatory Visit: Payer: Self-pay | Admitting: Family Medicine

## 2016-10-05 DIAGNOSIS — E119 Type 2 diabetes mellitus without complications: Secondary | ICD-10-CM

## 2016-10-05 DIAGNOSIS — R079 Chest pain, unspecified: Secondary | ICD-10-CM

## 2016-10-05 DIAGNOSIS — E782 Mixed hyperlipidemia: Secondary | ICD-10-CM | POA: Diagnosis not present

## 2016-10-05 DIAGNOSIS — R0789 Other chest pain: Secondary | ICD-10-CM | POA: Diagnosis not present

## 2016-10-05 DIAGNOSIS — I200 Unstable angina: Secondary | ICD-10-CM | POA: Diagnosis not present

## 2016-10-05 LAB — TROPONIN I: Troponin I: <0.03 ng/mL (ref <0.03)

## 2016-10-05 LAB — CBC
HCT: 37.2 % (ref 36.0–46.0)
Hemoglobin: 11.8 g/dL — ABNORMAL LOW (ref 12.0–15.0)
MCH: 26 pg (ref 26.0–34.0)
MCHC: 31.7 g/dL (ref 30.0–36.0)
MCV: 81.9 fL (ref 78.0–100.0)
PLATELETS: 181 10*3/uL (ref 150–400)
RBC: 4.54 MIL/uL (ref 3.87–5.11)
RDW: 13.2 % (ref 11.5–15.5)
WBC: 4.5 10*3/uL (ref 4.0–10.5)

## 2016-10-05 LAB — TSH: TSH: 3.448 u[IU]/mL (ref 0.350–4.500)

## 2016-10-05 LAB — LIPID PANEL
Cholesterol: 125 mg/dL (ref 0–200)
HDL: 43 mg/dL (ref >40)
LDL Cholesterol: 72 mg/dL (ref 0–99)
Total CHOL/HDL Ratio: 2.9 RATIO
Triglycerides: 50 mg/dL (ref <150)
VLDL: 10 mg/dL (ref 0–40)

## 2016-10-05 LAB — BASIC METABOLIC PANEL
Anion gap: 5 (ref 5–15)
BUN: 10 mg/dL (ref 6–20)
CALCIUM: 9.1 mg/dL (ref 8.9–10.3)
CHLORIDE: 110 mmol/L (ref 101–111)
CO2: 27 mmol/L (ref 22–32)
CREATININE: 0.8 mg/dL (ref 0.44–1.00)
GFR calc Af Amer: >60 mL/min (ref >60)
Glucose, Bld: 110 mg/dL — ABNORMAL HIGH (ref 65–99)
Potassium: 4.2 mmol/L (ref 3.5–5.1)
SODIUM: 142 mmol/L (ref 135–145)

## 2016-10-05 LAB — GLUCOSE, CAPILLARY
GLUCOSE-CAPILLARY: 179 mg/dL — AB (ref 65–99)
Glucose-Capillary: 112 mg/dL — ABNORMAL HIGH (ref 65–99)

## 2016-10-05 NOTE — Progress Notes (Signed)
Discharge instructions reviewed with pt. Pt has no questions at this time. IV d.c. Waiting on family to pick up pt.
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
## 2016-10-05 NOTE — Progress Notes (Signed)
Family Medicine Teaching Service Daily Progress Note Intern Pager: [phone]  Patient name: Lori Jones Medical record number: [id number] Date of birth: April 22, 1960 Age: 56 y.o. Gender: female  Primary Care Provider: Carlyle Dolly, MD Consultants: Cards Code Status: FULL  Pt Overview and Major Events to Date:  7/6 Admit to FPTS  Assessment and Plan: Lori Jones is a 56 y.o. female presenting with chest pain.  PMH is significant for T2DM, HTN, heart murmur, asthma, anemia, and hypothyroidism, presenting to the ED with chest pain for the past week.    Chest pain, rule out ACS. Labs unremarkable with d-dimer and I-stat troponin negative. Repeat troponins also neg x3. Initial EKG sinus rhythm with abnormal R wave progression and CXR negative for edema or consolidation. Received aspirin and SL nitroglycerin in ED which largely provided relief.  PE is less likely with Wells score 0, no shortness of breath, negative d-dimer, and no history of recent travel, immobility or DVT. Consider MSK etiology as it is reproducible with palpation and improved with Aleve. With history of anxiety and event with elevator would also consider related to panic attack. TSH and lipid panel WNL. Cardiology not recommending further work-up at this time. Chest pain now entirely resolved.  -Continue cardiac monitoring -Continue daily Aspirin 81  -Continue home statin  -SL Nitroglycerin 0.4 mg Q5 min PRN for chest pain  -Can resume home NSAIDs if needed -F/u A1C -Tylenol 650 mg Q6 PRN -Zofran 4 mg Q6 PRN  -Vitals per unit routine  -Appreciate cardiology recs - outpatient f/u if exertional chest pain or dyspnea develops -Likely d/c today  Hypothyroidism.  Stable. At home on Levothyroxine 25 mg daily.   -Continue home Synthroid   T2DM.  At home on Metformin. Per chart review, last Hgb A1c 06/14/2016 of 6.5%. CBG 112 this AM. -Hold home Metformin  -A1c pending   -sSSI -Monitor CBGs  HLD.  At home on  Lipitor 40 mg daily. Lipid panel WNL. -Continue home statin   Scoliosis. Notes she takes Tramadol at home for this but does not use anything stronger as it makes her sleepy/drowsy.   -Resume home Tramadol 50 mg Q6 PRN if necessary   History of pelvic kidney.  With normal function.  SCr 0.78 on admission. Remains at baseline at 0.8 this AM.  -Continue to monitor   Anxiety/depression.  Stable. Denies SI/HI on admission.  Does not take medications for this.  States she has removed herself from the environment that was triggering it and doing well off medications.   -Continue to monitor   FEN/GI: Heart diet, SLIV  Prophylaxis: Lovenox   Disposition: Possible discharge today  Subjective:  Patient reporting no chest pain today. Also denies shortness of breath or any complaints. Feels very well and says she would like to go home today  Objective: Temp:  [97.8 F (36.6 C)-98.3 F (36.8 C)] 97.8 F (36.6 C) (07/07 0848) Pulse Rate:  [59-74] 62 (07/07 0848) Resp:  [10-27] 16 (07/07 0616) BP: (104-140)/(67-91) 116/74 (07/07 0848) SpO2:  [97 %-100 %] 100 % (07/07 0616) Weight:  [175 lb (79.4 kg)-176 lb 9.6 oz (80.1 kg)] 176 lb (79.8 kg) (07/07 0616) Physical Exam: General: sitting up in bed in NAD Cardiovascular: RRR, no murmurs appreciated Respiratory: CTAB, no wheezes, normal WOB on RA Abdomen: soft, non-tender, non-distended, +BS Extremities: no LE edema Neuro: A&Ox4, no focal deficits Psych: appropriate mood and affect  Laboratory:  Recent Labs Lab 10/04/16 1745 10/05/16 0617  WBC 4.7 4.5  HGB 12.3 11.8*  HCT 37.9 37.2  PLT 216 181    Recent Labs Lab 10/04/16 1745 10/05/16 0617  NA 141 142  K 3.5 4.2  CL 107 110  CO2 25 27  BUN 14 10  CREATININE 0.78 0.80  CALCIUM 9.4 9.1  GLUCOSE 108* 110*    Imaging/Diagnostic Tests: Dg Chest 2 View  Result Date: 10/04/2016 CLINICAL DATA:  Chest pain.  Recent fall EXAM: CHEST  2 VIEW COMPARISON:  June 02, 2016  FINDINGS: Lungs are clear. Heart size and pulmonary vascularity are normal. No adenopathy. There is evidence of old rib trauma on the right. No pneumothorax. There is aortic atherosclerosis. There is midthoracic dextroscoliosis. IMPRESSION: No edema or consolidation. No evident pneumothorax. There is aortic atherosclerosis. Aortic Atherosclerosis (ICD10-I70.0). Electronically Signed   By: Lowella Grip III M.D.   On: 10/04/2016 18:09   Lori Mould, MD 10/05/2016, 9:16 AM PGY-3, [place] Intern pager: [phone], text pages welcome

## 2016-10-05 NOTE — Discharge Instructions (Signed)
Please continue to take all of your medications as you were prior to hospital admission.   It is very important to go to your hospital follow-up appointment with Dr. Lindell Noe at the [facility] on Friday, July 13 at 3:45PM.   If you develop chest pain or shortness of breath, please call our office or go to the emergency room.

## 2016-10-05 NOTE — Care Management Obs Status (Signed)
[place] NOTIFICATION   Patient Details  Name: Lori Jones MRN: [id number] Date of Birth: Mar 07, 1961   Medicare Observation Status Notification Given:  Yes    Dellie Catholic, RN 10/05/2016, 12:27 PM
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
## 2016-10-05 NOTE — Plan of Care (Signed)
Problem: Safety: Goal: Ability to remain free from injury will improve Outcome: Progressing Fall risk bundle remains in place. No injuries or falls this shift. Will continue to monitor.  Problem: Pain Managment: Goal: General experience of comfort will improve Outcome: Progressing Pt reports mild 2/10 cp. Pain does not radiate, VS stable, pt currently sleeping. Will continue to monitor and assess.

## 2016-10-05 NOTE — Care Management CC44 (Signed)
Condition Code 44 Documentation Completed  Patient Details  Name: Lori Jones MRN: [id number] Date of Birth: Mar 11, 1961   Condition Code 44 given:  Yes Patient signature on Condition Code 44 notice:  Yes Documentation of 2 MD's agreement:  Yes Code 44 added to claim:  Yes    Dellie Catholic, RN 10/05/2016, 12:28 PM

## 2016-10-06 LAB — HEMOGLOBIN A1C
HEMOGLOBIN A1C: 6.3 % — AB (ref 4.8–5.6)
Mean Plasma Glucose: 134 mg/dL

## 2016-10-07 NOTE — Consult Note (Signed)
           [facility] CM Primary Care Navigator  10/07/2016  Lori Jones 05-09-1960 [id number]   Attempt to see patientat the bedsideto identify possible discharge needs but she was alreadydischarged. Patient was discharged home over the weekend.   Noted that patient has an appointment scheduled on 7/13 at 3:45 pm(Family Medicine) for post hospital follow-up.  For questions, please contact:  Dannielle Huh, BSN, RN- [facility] Primary Care Navigator  Telephone: [phone] [place]

## 2016-10-11 ENCOUNTER — Ambulatory Visit (INDEPENDENT_AMBULATORY_CARE_PROVIDER_SITE_OTHER): Payer: Medicare Other | Admitting: Family Medicine

## 2016-10-11 ENCOUNTER — Encounter: Payer: Self-pay | Admitting: Family Medicine

## 2016-10-11 VITALS — BP 128/68 | HR 80 | Temp 98.0°F | Ht 67.5 in | Wt 176.4 lb

## 2016-10-11 DIAGNOSIS — M62838 Other muscle spasm: Secondary | ICD-10-CM

## 2016-10-11 DIAGNOSIS — I200 Unstable angina: Secondary | ICD-10-CM | POA: Diagnosis not present

## 2016-10-11 NOTE — Patient Instructions (Signed)
It was a pleasure to see you today! Thank you for choosing [facility] for your primary care. Lori Jones was seen for hospital follow up.   Our plans for today were:  Keep doing a great job with your medicines  Try heat for your back muscle spasm  Call us if you have worsening or new chest pain   Best,  Dr. Lindell Noe

## 2016-10-11 NOTE — Progress Notes (Deleted)
   CC: ***  HPI  CC, SH/smoking status, and VS noted  Objective: BP 128/68   Pulse 80   Temp 98 F (36.7 C) (Oral)   Ht 5' 7.5" (1.715 m)   Wt 176 lb 6.4 oz (80 kg)   SpO2 98%   BMI 27.22 kg/m  Gen: NAD, alert, cooperative, and pleasant.*** HEENT: NCAT, EOMI, PERRL CV: RRR, no murmur Resp: CTAB, no wheezes, non-labored Abd: SNTND, BS present, no guarding or organomegaly Ext: No edema, warm Neuro: Alert and oriented, Speech clear, No gross deficits  Assessment and plan:  No problem-specific Assessment & Plan notes found for this encounter.   No orders of the defined types were placed in this encounter.   No orders of the defined types were placed in this encounter.   Health Maintenance: ***  Ralene Ok, MD, PGY2 10/11/2016 4:42 PM

## 2016-10-11 NOTE — Progress Notes (Signed)
   CC: hosp follow up   HPI  Hospital follow up:  Admitted for - ACS r/o; chest pain found to be atypical, troponins negative.  Since discharge: patient has been feeling well. Denies CP today. No issues with access to meds.  Med rec completed.   Has noticed some intermittent back pain that is not worsened with activity. No known injury. Has tried NSAIDs. No radiation, no change in urination or sensation.   CC, SH/smoking status, and VS noted  Objective: BP 128/68   Pulse 80   Temp 98 F (36.7 C) (Oral)   Ht 5' 7.5" (1.715 m)   Wt 176 lb 6.4 oz (80 kg)   SpO2 98%   BMI 27.22 kg/m  Gen: NAD, alert, cooperative, and pleasant. HEENT: NCAT, EOMI, PERRL CV: RRR, no murmur Resp: CTAB, no wheezes, non-labored MSK: patient TTP over lumbar paraspinal region, no stepoffs, no spinal tenderness Ext: No edema, warm Neuro: Alert and oriented, Speech clear, No gross deficits  Assessment and plan:  Hospital follow up: doing well. Med rec completed, patient not using tramadol anymore. She feels well. No concerns.   Back pain: consistent with muscle spasm of paraspinal muscles. Continue PRN NSAIDs, try icy hot or heat patches and stretching.   Health Maintenance: follow up with PCP   Ralene Ok, MD, PGY2 10/15/2016 10:09 AM

## 2016-11-14 ENCOUNTER — Other Ambulatory Visit: Payer: Self-pay | Admitting: Family Medicine

## 2016-11-14 ENCOUNTER — Ambulatory Visit (INDEPENDENT_AMBULATORY_CARE_PROVIDER_SITE_OTHER): Payer: Medicare Other | Admitting: Family Medicine

## 2016-11-14 ENCOUNTER — Encounter: Payer: Self-pay | Admitting: Family Medicine

## 2016-11-14 VITALS — BP 122/60 | HR 76 | Temp 98.1°F | Ht 68.0 in | Wt 176.4 lb

## 2016-11-14 DIAGNOSIS — I200 Unstable angina: Secondary | ICD-10-CM | POA: Diagnosis not present

## 2016-11-14 DIAGNOSIS — M549 Dorsalgia, unspecified: Secondary | ICD-10-CM | POA: Diagnosis present

## 2016-11-14 MED ORDER — IBUPROFEN 800 MG PO TABS: 800.0000 mg | ORAL_TABLET | Freq: Two times a day (BID) | ORAL | 0 refills | Status: DC
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
## 2016-11-19 ENCOUNTER — Other Ambulatory Visit: Payer: Self-pay | Admitting: Family Medicine

## 2016-11-20 ENCOUNTER — Ambulatory Visit
Admission: RE | Admit: 2016-11-20 | Discharge: 2016-11-20 | Disposition: A | Discharge status: Home / Self Care | Payer: Medicare Other | Source: Ambulatory Visit | Attending: Family Medicine | Admitting: Family Medicine

## 2016-11-20 DIAGNOSIS — S3992XA Unspecified injury of lower back, initial encounter: Secondary | ICD-10-CM | POA: Diagnosis not present

## 2016-11-20 DIAGNOSIS — M47812 Spondylosis without myelopathy or radiculopathy, cervical region: Secondary | ICD-10-CM | POA: Diagnosis not present

## 2016-11-20 DIAGNOSIS — S299XXA Unspecified injury of thorax, initial encounter: Secondary | ICD-10-CM | POA: Diagnosis not present

## 2016-11-20 DIAGNOSIS — M549 Dorsalgia, unspecified: Secondary | ICD-10-CM

## 2016-11-20 DIAGNOSIS — M545 Low back pain: Secondary | ICD-10-CM | POA: Diagnosis not present

## 2016-12-06 ENCOUNTER — Encounter: Payer: Self-pay | Admitting: Family Medicine

## 2016-12-06 ENCOUNTER — Ambulatory Visit (INDEPENDENT_AMBULATORY_CARE_PROVIDER_SITE_OTHER): Payer: Medicare Other | Admitting: Family Medicine

## 2016-12-06 DIAGNOSIS — G8929 Other chronic pain: Secondary | ICD-10-CM

## 2016-12-06 DIAGNOSIS — E119 Type 2 diabetes mellitus without complications: Secondary | ICD-10-CM | POA: Diagnosis not present

## 2016-12-06 DIAGNOSIS — M549 Dorsalgia, unspecified: Secondary | ICD-10-CM | POA: Diagnosis not present

## 2016-12-06 DIAGNOSIS — E089 Diabetes mellitus due to underlying condition without complications: Secondary | ICD-10-CM

## 2016-12-06 DIAGNOSIS — I200 Unstable angina: Secondary | ICD-10-CM

## 2016-12-06 MED ORDER — ATORVASTATIN CALCIUM 40 MG PO TABS: 40.0000 mg | ORAL_TABLET | Freq: Every day | ORAL | 0 refills | Status: DC

## 2016-12-06 NOTE — Patient Instructions (Signed)
Thank you for coming in today, it was so nice to see you! Today we talked about:    Continue ibuprofen, heating pad, and massage for back pain  Please follow up at the end of October for A1c. You can schedule this appointment at the front desk before you leave or call the clinic.    If you have any questions or concerns, please do not hesitate to call the office at [phone]. You can also message me directly via MyChart.   Sincerely,  Smitty Cords, MD
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
## 2017-01-20 ENCOUNTER — Other Ambulatory Visit: Payer: Self-pay | Admitting: Family Medicine

## 2017-01-20 DIAGNOSIS — E089 Diabetes mellitus due to underlying condition without complications: Secondary | ICD-10-CM

## 2017-01-21 ENCOUNTER — Encounter: Payer: Self-pay | Admitting: Family Medicine

## 2017-01-24 ENCOUNTER — Ambulatory Visit: Payer: Medicare Other | Admitting: Family Medicine

## 2017-02-03 ENCOUNTER — Encounter: Payer: Self-pay | Admitting: Family Medicine

## 2017-02-03 ENCOUNTER — Ambulatory Visit (INDEPENDENT_AMBULATORY_CARE_PROVIDER_SITE_OTHER): Payer: Medicare HMO | Admitting: Family Medicine

## 2017-02-03 VITALS — BP 122/68 | HR 69 | Temp 98.3°F | Ht 68.5 in | Wt 177.6 lb

## 2017-02-03 DIAGNOSIS — I200 Unstable angina: Secondary | ICD-10-CM | POA: Diagnosis not present

## 2017-02-03 DIAGNOSIS — E089 Diabetes mellitus due to underlying condition without complications: Secondary | ICD-10-CM

## 2017-02-03 DIAGNOSIS — E119 Type 2 diabetes mellitus without complications: Secondary | ICD-10-CM | POA: Diagnosis not present

## 2017-02-03 LAB — POCT GLYCOSYLATED HEMOGLOBIN (HGB A1C): Hemoglobin A1C: 6.1
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
## 2017-02-03 NOTE — Patient Instructions (Addendum)
Results for Lori Jones, Lori Jones (MRN [id number]) as of 02/03/2017 10:31  Ref. Range 10/05/2016 67:67  BASIC METABOLIC PANEL Unknown Rpt (A)  Sodium Latest Ref Range: 135 - 145 mmol/L 142  Potassium Latest Ref Range: 3.5 - 5.1 mmol/L 4.2  Chloride Latest Ref Range: 101 - 111 mmol/L 110  CO2 Latest Ref Range: 22 - 32 mmol/L 27  Glucose Latest Ref Range: 65 - 99 mg/dL 110 (H)  BUN Latest Ref Range: 6 - 20 mg/dL 10  Creatinine Latest Ref Range: 0.44 - 1.00 mg/dL 0.80  Calcium Latest Ref Range: 8.9 - 10.3 mg/dL 9.1  Anion gap Latest Ref Range: 5 - 15  5  GFR, Est Non African American Latest Ref Range: >60 mL/min >60  GFR, Est African American Latest Ref Range: >60 mL/min >60  Troponin I Latest Ref Range: <0.03 ng/mL <0.03  WBC Latest Ref Range: 4.0 - 10.5 K/uL 4.5  RBC Latest Ref Range: 3.87 - 5.11 MIL/uL 4.54  Hemoglobin Latest Ref Range: 12.0 - 15.0 g/dL 11.8 (L)  HCT Latest Ref Range: 36.0 - 46.0 % 37.2  MCV Latest Ref Range: 78.0 - 100.0 fL 81.9  MCH Latest Ref Range: 26.0 - 34.0 pg 26.0  MCHC Latest Ref Range: 30.0 - 36.0 g/dL 31.7  RDW Latest Ref Range: 11.5 - 15.5 % 13.2  Platelets Latest Ref Range: 150 - 400 K/uL 181   Results for Lori Jones, Lori Jones (MRN [id number]) as of 02/03/2017 10:31  Ref. Range 02/03/2017 10:20  Hemoglobin A1C Unknown 6.1

## 2017-02-04 NOTE — Assessment & Plan Note (Addendum)
Patient technically has prediabetes now with an A1c of 6.1. She has been doing wonderful in terms of limiting her carbohydrate and sugar intake and exercising regularly. She has brought down her A1c at every visit. -Praised patient for bringing down her A1c and now being in prediabetic range -Continue metformin 1000 mg twice a day for now -Continue nutrition and exercise regimen -Patient would like to follow up in 3 months to recheck her A1c and if she is no longer prediabetic we can consider stopping her metformin

## 2017-02-12 ENCOUNTER — Other Ambulatory Visit: Payer: Self-pay | Admitting: Family Medicine

## 2017-02-15 ENCOUNTER — Other Ambulatory Visit: Payer: Self-pay | Admitting: Family Medicine

## 2017-03-21 DIAGNOSIS — Z1231 Encounter for screening mammogram for malignant neoplasm of breast: Secondary | ICD-10-CM | POA: Diagnosis not present

## 2017-04-21 ENCOUNTER — Encounter: Payer: Self-pay | Admitting: Family Medicine

## 2017-04-29 DIAGNOSIS — Z6828 Body mass index (BMI) 28.0-28.9, adult: Secondary | ICD-10-CM | POA: Diagnosis not present

## 2017-04-29 DIAGNOSIS — Z124 Encounter for screening for malignant neoplasm of cervix: Secondary | ICD-10-CM | POA: Diagnosis not present

## 2017-05-09 ENCOUNTER — Other Ambulatory Visit: Payer: Self-pay

## 2017-05-09 ENCOUNTER — Encounter: Payer: Self-pay | Admitting: Family Medicine

## 2017-05-09 ENCOUNTER — Ambulatory Visit (INDEPENDENT_AMBULATORY_CARE_PROVIDER_SITE_OTHER): Payer: Medicare HMO | Admitting: Family Medicine

## 2017-05-09 VITALS — BP 126/76 | HR 75 | Temp 98.1°F | Ht 69.0 in | Wt 181.8 lb

## 2017-05-09 DIAGNOSIS — E039 Hypothyroidism, unspecified: Secondary | ICD-10-CM

## 2017-05-09 DIAGNOSIS — E089 Diabetes mellitus due to underlying condition without complications: Secondary | ICD-10-CM | POA: Diagnosis not present

## 2017-05-09 LAB — POCT GLYCOSYLATED HEMOGLOBIN (HGB A1C): Hemoglobin A1C: 6.4

## 2017-05-09 MED ORDER — ATORVASTATIN CALCIUM 40 MG PO TABS: 40.0000 mg | ORAL_TABLET | Freq: Every day | ORAL | 0 refills | Status: DC

## 2017-05-09 MED ORDER — ASPIRIN EC 81 MG PO TBEC: 81.0000 mg | DELAYED_RELEASE_TABLET | Freq: Every day | ORAL | 0 refills | Status: AC

## 2017-05-09 MED ORDER — METFORMIN HCL 1000 MG PO TABS: ORAL_TABLET | ORAL | 0 refills | Status: DC

## 2017-05-09 NOTE — Patient Instructions (Addendum)
Thank you for coming in today, it was so nice to see you! Today we talked about:    Prediabetes: Your A1C did go up a little bit but we will recheck it in 3 months after your nutrition goes back to how you were eating before. Great job going to [employer], keep it up  We are going to check your thyroid hormone today  Your foot exam is normal today which is great news  Please follow up in 3 month for prediabetes and A1C check.   Bring in all your medications or supplements to each appointment for review.   If we ordered any tests today, you will be notified via telephone of any abnormalities. If everything is normal you will get a letter in the mail.   If you have any questions or concerns, please do not hesitate to call the office at [phone]. You can also message me directly via MyChart.   Sincerely,  Smitty Cords, MD
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
## 2017-05-10 LAB — TSH: TSH: 3.07 u[IU]/mL (ref 0.450–4.500)

## 2017-05-11 NOTE — Assessment & Plan Note (Signed)
A1c slightly increased today to 6.4.  Technically still prediabetic.  She notes that she has not followed a diabetic diet due to the holidays but she is determined to decrease her carbohydrate and sugar intake and continue to exercise regularly. -Continue metformin 1000 mg twice daily -Follow-up in 3 months

## 2017-05-12 ENCOUNTER — Encounter: Payer: Self-pay | Admitting: Family Medicine

## 2017-05-13 DIAGNOSIS — E119 Type 2 diabetes mellitus without complications: Secondary | ICD-10-CM | POA: Diagnosis not present

## 2017-05-13 DIAGNOSIS — H401131 Primary open-angle glaucoma, bilateral, mild stage: Secondary | ICD-10-CM | POA: Diagnosis not present

## 2017-05-13 DIAGNOSIS — H25813 Combined forms of age-related cataract, bilateral: Secondary | ICD-10-CM | POA: Diagnosis not present

## 2017-05-13 LAB — HM DIABETES EYE EXAM

## 2017-05-16 ENCOUNTER — Other Ambulatory Visit: Payer: Self-pay | Admitting: Family Medicine

## 2017-06-06 ENCOUNTER — Encounter: Payer: Self-pay | Admitting: Family Medicine

## 2017-06-09 ENCOUNTER — Encounter (HOSPITAL_COMMUNITY): Payer: Self-pay

## 2017-06-09 ENCOUNTER — Emergency Department (HOSPITAL_COMMUNITY)
Admission: EM | Admit: 2017-06-09 | Discharge: 2017-06-09 | Disposition: A | Discharge status: Home / Self Care | Payer: Medicare HMO | Attending: Emergency Medicine | Admitting: Emergency Medicine

## 2017-06-09 DIAGNOSIS — R51 Headache: Secondary | ICD-10-CM | POA: Insufficient documentation

## 2017-06-09 DIAGNOSIS — Z5321 Procedure and treatment not carried out due to patient leaving prior to being seen by health care provider: Secondary | ICD-10-CM | POA: Insufficient documentation

## 2017-06-09 NOTE — ED Notes (Signed)
Pt did not want to wait and chose to leave
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
## 2017-06-16 ENCOUNTER — Other Ambulatory Visit: Payer: Self-pay

## 2017-06-16 ENCOUNTER — Ambulatory Visit (INDEPENDENT_AMBULATORY_CARE_PROVIDER_SITE_OTHER): Payer: Medicare HMO | Admitting: Student

## 2017-06-16 ENCOUNTER — Encounter: Payer: Self-pay | Admitting: Student

## 2017-06-16 VITALS — BP 140/74 | HR 81 | Temp 98.2°F | Ht 69.0 in | Wt 184.8 lb

## 2017-06-16 DIAGNOSIS — S0990XA Unspecified injury of head, initial encounter: Secondary | ICD-10-CM | POA: Diagnosis not present

## 2017-06-16 NOTE — Patient Instructions (Addendum)
It was great seeing you today! We have addressed the following issues today  I am sorry about the injury.  I do not think you have concussion.  I also do not think we need to do any imaging at this point.  Continue taking ibuprofen as needed for headache.  Please come back and see [place] if you have worsening of your headache or other symptoms concerning to you. If we did any lab work today, and the results require attention, either me or my nurse will get in touch with you. If everything is normal, you will get a letter in mail and a message via . If you don't hear from [place] in two weeks, please give [place] a call. Otherwise, we look forward to seeing you again at your next visit. If you have any questions or concerns before then, please call the clinic at [phone].  Please bring all your medications to every doctors visit  Sign up for My Chart to have easy access to your labs results, and communication with your Primary care physician.    Please check-out at the front desk before leaving the clinic.    Take Care,   Dr. Cyndia Skeeters
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
## 2017-07-22 ENCOUNTER — Other Ambulatory Visit: Payer: Self-pay | Admitting: Family Medicine

## 2017-07-28 ENCOUNTER — Ambulatory Visit (INDEPENDENT_AMBULATORY_CARE_PROVIDER_SITE_OTHER): Payer: Medicare HMO | Admitting: Family Medicine

## 2017-07-28 ENCOUNTER — Other Ambulatory Visit: Payer: Self-pay

## 2017-07-28 ENCOUNTER — Encounter: Payer: Self-pay | Admitting: Family Medicine

## 2017-07-28 VITALS — BP 138/72 | HR 60 | Temp 98.3°F | Wt 178.0 lb

## 2017-07-28 DIAGNOSIS — S0990XD Unspecified injury of head, subsequent encounter: Secondary | ICD-10-CM | POA: Diagnosis not present
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
## 2017-07-30 ENCOUNTER — Ambulatory Visit: Payer: Medicare HMO | Admitting: Family Medicine

## 2017-08-14 ENCOUNTER — Other Ambulatory Visit: Payer: Self-pay | Admitting: Family Medicine

## 2017-08-27 NOTE — Progress Notes (Signed)
   Subjective:    Patient ID: Lori Jones , female   DOB: 01-Mar-1961 , 57 y.o..   MRN: [id number]  HPI  DELAYNEE Jones is here for  Chief Complaint  Patient presents with  . Diabetes    1. Chronic Diabetes Disease Monitoring  Blood Sugar Ranges: Does not check   Polyuria: no   Visual problems: no   Last hemoglobin A1C:  Lab Results  Component Value Date   HGBA1C 6.5 08/28/2017   Medication Compliance: yes, with Metformin  Medication Side Effects  Hypoglycemia: no   Preventitive Health Care  Eye Exam: Up-to-date, no retinopathy  Foot Exam: up to date  Diet pattern: She notes that she "lost track".  She has been eating the mostly whenever she wants.  She notes that she had a period of depression over losing some hair but she is back to her regular schedule now.  Exercise: Joined a gym with a friend with silver sneakers at the [facility]   Review of Systems: Per HPI.   Past Medical History: Patient Active Problem List   Diagnosis Date Noted  . Hair loss 03/06/2016  . HLD (hyperlipidemia) 10/06/2013  . Hypothyroidism 08/06/2013  . Schizoaffective disorder, unspecified type ([place]) 07/10/2013  . Diabetes mellitus due to underlying condition without complications ([place]) 45/80/9983  . Heart murmur 07/10/2013    Medications: reviewed and updated Current Outpatient Medications  Medication Sig Dispense Refill  . aspirin EC 81 MG tablet Take 1 tablet (81 mg total) by mouth daily. 90 tablet 0  . atorvastatin (LIPITOR) 40 MG tablet Take 1 tablet (40 mg total) by mouth daily. 90 tablet 0  . IBU 800 MG tablet TAKE 1 TABLET BY MOUTH TWICE DAILY 180 tablet 0  . levothyroxine (SYNTHROID, LEVOTHROID) 25 MCG tablet TAKE 1 TABLET BY MOUTH EVERY DAY BEFORE BREAKFAST, OVERDUE FOR THYROID FOLLOW UP 90 tablet 0  . metFORMIN (GLUCOPHAGE) 1000 MG tablet TAKE 1 TABLET BY MOUTH TWICE DAILY WITH A MEAL 180 tablet 0   No current facility-administered medications for this visit.     Social Hx:  reports that she has never smoked. She has never used smokeless tobacco.   Objective:   BP 128/70   Pulse 60   Temp 97.9 F (36.6 C) (Oral)   Wt 178 lb 12.8 oz (81.1 kg)   SpO2 99%   BMI 26.40 kg/m  Physical Exam  Gen: NAD, alert, cooperative with exam, well-appearing Cardiac: Regular rate and rhythm, normal S1/S2, no murmur, no edema, capillary refill brisk  Respiratory: Clear to auscultation bilaterally, no wheezes, non-labored breathing Psych: good insight, normal mood and affect  Assessment & Plan:  Diabetes mellitus due to underlying condition without complications Controlled.  A1c 6.5 today.  Goal A1c is less than 7 -Continue metformin 1000 mg twice daily -Can recheck A1c in 3 to 6 months -Discussed continuing walking, (patient's favorite exercise) and decreasing simple sugars and carbohydrates from diet  HLD (hyperlipidemia) History of hyperlipidemia that is well controlled on Lipitor 40 mg daily.  - Repeat lipid panel today -Continue atorvastatin 40 mg daily   Orders Placed This Encounter  Procedures  . Lipid panel  . CBC  . Basic Metabolic Panel  . HgB A1c   Smitty Cords, MD [place], PGY-3

## 2017-08-28 ENCOUNTER — Encounter: Payer: Self-pay | Admitting: Family Medicine

## 2017-08-28 ENCOUNTER — Other Ambulatory Visit: Payer: Self-pay

## 2017-08-28 ENCOUNTER — Ambulatory Visit (INDEPENDENT_AMBULATORY_CARE_PROVIDER_SITE_OTHER): Payer: Medicare HMO | Admitting: Family Medicine

## 2017-08-28 VITALS — BP 128/70 | HR 60 | Temp 97.9°F | Wt 178.8 lb

## 2017-08-28 DIAGNOSIS — E785 Hyperlipidemia, unspecified: Secondary | ICD-10-CM | POA: Diagnosis not present

## 2017-08-28 DIAGNOSIS — E089 Diabetes mellitus due to underlying condition without complications: Secondary | ICD-10-CM | POA: Diagnosis not present

## 2017-08-28 LAB — POCT GLYCOSYLATED HEMOGLOBIN (HGB A1C): HbA1c, POC (controlled diabetic range): 6.5 % (ref 0.0–7.0)

## 2017-08-28 NOTE — Patient Instructions (Signed)
Thank you for coming in today, it was so nice to see you! Today we talked about:    Diabetes; your A1c is 6.5 today, this is still very good.  Your goal A1c is to be below 7.  Continue taking metformin twice daily.  We will recheck your A1c in 3 to 6 months  We are going to check some blood work today  Please follow up in 3-6 months. You can schedule this appointment at the front desk before you leave or call the clinic.  Bring in all your medications or supplements to each appointment for review.   If we ordered any tests today, you will be notified via telephone of any abnormalities. If everything is normal you will get a letter in the mail.   If you have any questions or concerns, please do not hesitate to call the office at [phone]. You can also message me directly via MyChart.   Sincerely,  Smitty Cords, MD

## 2017-08-28 NOTE — Assessment & Plan Note (Signed)
Controlled.  A1c 6.5 today.  Goal A1c is less than 7 -Continue metformin 1000 mg twice daily -Can recheck A1c in 3 to 6 months -Discussed continuing walking, (patient's favorite exercise) and decreasing simple sugars and carbohydrates from diet

## 2017-08-28 NOTE — Assessment & Plan Note (Signed)
History of hyperlipidemia that is well controlled on Lipitor 40 mg daily.  - Repeat lipid panel today -Continue atorvastatin 40 mg daily

## 2017-08-29 LAB — BASIC METABOLIC PANEL
BUN / CREAT RATIO: 14 (ref 9–23)
BUN: 10 mg/dL (ref 6–24)
CO2: 22 mmol/L (ref 20–29)
CREATININE: 0.72 mg/dL (ref 0.57–1.00)
Calcium: 9.3 mg/dL (ref 8.7–10.2)
Chloride: 107 mmol/L — ABNORMAL HIGH (ref 96–106)
GFR calc Af Amer: 108 mL/min/{1.73_m2} (ref >59)
GFR, EST NON AFRICAN AMERICAN: 93 mL/min/{1.73_m2} (ref >59)
Glucose: 138 mg/dL — ABNORMAL HIGH (ref 65–99)
Potassium: 4 mmol/L (ref 3.5–5.2)
SODIUM: 142 mmol/L (ref 134–144)

## 2017-08-29 LAB — CBC
HEMATOCRIT: 38 % (ref 34.0–46.6)
HEMOGLOBIN: 12.5 g/dL (ref 11.1–15.9)
MCH: 26.8 pg (ref 26.6–33.0)
MCHC: 32.9 g/dL (ref 31.5–35.7)
MCV: 81 fL (ref 79–97)
Platelets: 224 10*3/uL (ref 150–450)
RBC: 4.67 x10E6/uL (ref 3.77–5.28)
RDW: 14.5 % (ref 12.3–15.4)
WBC: 4.1 10*3/uL (ref 3.4–10.8)

## 2017-08-29 LAB — LIPID PANEL
CHOLESTEROL TOTAL: 127 mg/dL (ref 100–199)
Chol/HDL Ratio: 2.8 ratio (ref 0.0–4.4)
HDL: 45 mg/dL (ref >39)
LDL Calculated: 69 mg/dL (ref 0–99)
TRIGLYCERIDES: 63 mg/dL (ref 0–149)
VLDL Cholesterol Cal: 13 mg/dL (ref 5–40)

## 2017-09-01 ENCOUNTER — Encounter: Payer: Self-pay | Admitting: Family Medicine

## 2017-09-17 ENCOUNTER — Ambulatory Visit (INDEPENDENT_AMBULATORY_CARE_PROVIDER_SITE_OTHER): Payer: Medicare HMO | Admitting: Family Medicine

## 2017-09-17 ENCOUNTER — Encounter: Payer: Self-pay | Admitting: Family Medicine

## 2017-09-17 ENCOUNTER — Other Ambulatory Visit: Payer: Self-pay

## 2017-09-17 VITALS — BP 110/70 | HR 61 | Temp 98.1°F | Wt 179.0 lb

## 2017-09-17 DIAGNOSIS — Z712 Person consulting for explanation of examination or test findings: Secondary | ICD-10-CM

## 2017-09-17 NOTE — Progress Notes (Signed)
   Subjective:    Patient ID: Lori Jones , female   DOB: [birth year] , 57 y.o..   MRN: [id number]  HPI  Lori Jones is here for  Chief Complaint  Patient presents with  . Results    1.  Patient wanting to go over her lab results today.  She notes that she got a letter in the mail receiving them she is confused by the numbers.  She wants to make sure that nothing is abnormal.  Some people have been asking her if she has cancer because she has been losing her hair and she is wondering what her white blood cell level is.  Social Hx:  reports that she has never smoked. She has never used smokeless tobacco.   Objective:   BP 110/70   Pulse 61   Temp 98.1 F (36.7 C) (Oral)   Wt 179 lb (81.2 kg)   SpO2 97%   BMI 26.43 kg/m  Physical Exam  Gen: NAD, alert, cooperative with exam, well-appearing  Assessment & Plan:   1. Encounter to discuss test results: Discussed lab results with patient.  She was concerned because her chloride was 107 and glucose was 138.  Discussed with patient that chloride is slightly elevated but is very and concerning for any pathology.  Discussed that a glucose of 138 is also not concerning at this time, we are currently treating her for diabetes.  Patient also worried that she has cancer and wants to know what her white blood cell level was, provided reassurance that her laboratory results do not indicate she has cancer.  She was appreciative of the discussion.  Asked patient to follow-up in 1 month for diabetes.  Smitty Cords, MD [place], PGY-3

## 2017-10-07 DIAGNOSIS — Z1211 Encounter for screening for malignant neoplasm of colon: Secondary | ICD-10-CM | POA: Diagnosis not present

## 2017-10-07 DIAGNOSIS — Z800 Family history of malignant neoplasm of digestive organs: Secondary | ICD-10-CM | POA: Diagnosis not present

## 2017-10-07 DIAGNOSIS — K429 Umbilical hernia without obstruction or gangrene: Secondary | ICD-10-CM | POA: Diagnosis not present

## 2017-10-20 ENCOUNTER — Other Ambulatory Visit: Payer: Self-pay | Admitting: Family Medicine

## 2017-10-20 DIAGNOSIS — E089 Diabetes mellitus due to underlying condition without complications: Secondary | ICD-10-CM

## 2017-10-20 NOTE — Telephone Encounter (Signed)
Patient needs a follow up appointment for her diabetes.  Please advise.

## 2017-10-22 NOTE — Telephone Encounter (Signed)
Contacted pt and schedule appointment for diabetes on 11/10/17 @10am . Katharina Caper, April D, CMA

## 2017-11-10 ENCOUNTER — Ambulatory Visit (INDEPENDENT_AMBULATORY_CARE_PROVIDER_SITE_OTHER): Payer: Medicare HMO | Admitting: Family Medicine

## 2017-11-10 ENCOUNTER — Other Ambulatory Visit: Payer: Self-pay

## 2017-11-10 ENCOUNTER — Encounter: Payer: Self-pay | Admitting: Family Medicine

## 2017-11-10 VITALS — BP 122/68 | HR 71 | Temp 97.8°F | Ht 69.0 in | Wt 180.0 lb

## 2017-11-10 DIAGNOSIS — E089 Diabetes mellitus due to underlying condition without complications: Secondary | ICD-10-CM | POA: Diagnosis not present

## 2017-11-10 DIAGNOSIS — E039 Hypothyroidism, unspecified: Secondary | ICD-10-CM | POA: Diagnosis not present

## 2017-11-10 DIAGNOSIS — E785 Hyperlipidemia, unspecified: Secondary | ICD-10-CM | POA: Diagnosis not present

## 2017-11-10 DIAGNOSIS — L659 Nonscarring hair loss, unspecified: Secondary | ICD-10-CM

## 2017-11-10 LAB — POCT GLYCOSYLATED HEMOGLOBIN (HGB A1C): HBA1C, POC (CONTROLLED DIABETIC RANGE): 6.5 % (ref 0.0–7.0)

## 2017-11-10 LAB — POCT UA - MICROALBUMIN
Creatinine, POC: 200 mg/dL
MICROALBUMIN (UR) POC: 80 mg/L

## 2017-11-10 MED ORDER — LEVOTHYROXINE SODIUM 25 MCG PO TABS: 25.0000 ug | ORAL_TABLET | Freq: Every day | ORAL | 3 refills | Status: DC

## 2017-11-10 MED ORDER — ATORVASTATIN CALCIUM 40 MG PO TABS: 40.0000 mg | ORAL_TABLET | Freq: Every day | ORAL | 3 refills | Status: DC

## 2017-11-10 MED ORDER — METFORMIN HCL 1000 MG PO TABS: 1000.0000 mg | ORAL_TABLET | Freq: Two times a day (BID) | ORAL | 3 refills | Status: DC

## 2017-11-10 NOTE — Progress Notes (Signed)
Subjective: Chief Complaint  Patient presents with  . Diabetes  . Alopecia     HPI: Lori Jones is a 57 y.o. presenting to clinic today to discuss the following:  1 DM Chronic Diabetes  Disease Monitoring  Blood Sugar Ranges: does not check  Polyuria: no   Visual problems: no   Last hemoglobin A1C:  Lab Results  Component Value Date   HGBA1C 6.5 11/10/2017    Medication Compliance: yes  Medication Side Effects  Hypoglycemia: no   Preventitive Health Care  Eye Exam: UTD  Foot Exam: UTD  Diet pattern: Has been eating "everything" due to stress, but notes that her stress is well controlled now and she will be adhering to a better diet  Exercise: Has not been exercising recently due to stress, states that she is planning to start walking again as stress has resolved.  2 Hair Loss Patient has a history of diffuse hair loss and thinning since 2017.  She has a history of hypothyroidism and is well-controlled on Synthroid 45mcg, last TSH 05/2017 was WNL.  Patient states that her hair has been gradually thinning since this time and that her daughter has also experienced similar symptoms.  Denies hair coming out in clumps, denies hair loss in mother or grandmother.  States that it has not changed drastically since it began in 2017.  Denies using any OTC treatments.  Denies fatigue, weight gain, heat or cold intolerance.   Health Maintenance: Urine microalbumin due, needs colonoscopy     ROS noted in HPI.   Past Medical, Surgical, Social, and Family History Reviewed & Updated per EMR.   Pertinent Historical Findings include:   Social History   Tobacco Use  Smoking Status Never Smoker  Smokeless Tobacco Never Used      Objective: BP 122/68   Pulse 71   Temp 97.8 F (36.6 C) (Oral)   Ht 5\' 9"  (1.753 m)   Wt 180 lb (81.6 kg)   SpO2 98%   BMI 26.58 kg/m  Vitals and nursing notes reviewed  Physical Exam  Constitutional: She appears well-developed and  well-nourished. No distress.  HENT:  Head: Normocephalic and atraumatic.  Neck: Normal range of motion. Neck supple. No thyromegaly present.  Cardiovascular: Normal rate and regular rhythm. Exam reveals no gallop and no friction rub.  No murmur heard. Pulmonary/Chest: Effort normal and breath sounds normal. No respiratory distress. She has no wheezes. She has no rales.  Abdominal: Soft. Bowel sounds are normal. There is no tenderness.  Musculoskeletal: She exhibits no edema.  Lymphadenopathy:    She has no cervical adenopathy.  Skin: Skin is warm and dry. No rash noted. She is not diaphoretic.     Results for orders placed or performed in visit on 11/10/17 (from the past 72 hour(s))  POCT UA - Microalbumin     Status: Abnormal   Collection Time: 11/10/17 12:00 AM  Result Value Ref Range   Microalbumin Ur, POC 80 mg/L   Creatinine, POC 200 mg/dL   Albumin/Creatinine Ratio, Urine, POC 30-300   HgB A1c     Status: None   Collection Time: 11/10/17 10:08 AM  Result Value Ref Range   Hemoglobin A1C     HbA1c POC (<> result, manual entry)     HbA1c, POC (prediabetic range)     HbA1c, POC (controlled diabetic range) 6.5 0.0 - 7.0 %    Assessment/Plan:  Hypothyroidism TSH normal 05/2017.  No symptoms of thyroid dysfunction.  Weight is stable, no fatigue. -Will repeat TSH in 6 months -Continue Synthroid at 53mcg QAM  Diabetes mellitus due to underlying condition without complications Well-controlled.  A1c 6.5 today, unchanged from previous.  Goal A1c <7. - Recheck A1c in 3-6 months - Continue Metformin 100mg  BID - Encouraged resuming exercise and healthy diet. - Urine microalbumin today, 30-300 - Consider starting low dose ACE at next visit for kidney protection in setting of DM -repeat Urine microalbumin in 6 months.  HLD (hyperlipidemia) Lipid panel WNL 07/2017.  -Cont Atorvastatin 40mg  QD -Repeat Lipid Panel in 1 year  Hair loss Unchanged.  Has been occurring since 2017.   TSH WNL 05/2017.  Likely secondary to stress and menopause. -Patient encouraged to try Rogaine OTC     PATIENT EDUCATION PROVIDED: See AVS    Diagnosis and plan along with any newly prescribed medication(s) were discussed in detail with this patient today. The patient verbalized understanding and agreed with the plan. Patient advised if symptoms worsen return to clinic or ER.   Health Maintainance: Urine microalbumin today, patient is scheduled for a colonoscopy   Orders Placed This Encounter  Procedures  . HgB A1c  . POCT UA - Microalbumin    Meds ordered this encounter  Medications  . atorvastatin (LIPITOR) 40 MG tablet    Sig: Take 1 tablet (40 mg total) by mouth daily.    Dispense:  90 tablet    Refill:  3  . levothyroxine (SYNTHROID, LEVOTHROID) 25 MCG tablet    Sig: Take 1 tablet (25 mcg total) by mouth daily before breakfast.    Dispense:  90 tablet    Refill:  3  . metFORMIN (GLUCOPHAGE) 1000 MG tablet    Sig: Take 1 tablet (1,000 mg total) by mouth 2 (two) times daily with a meal.    Dispense:  180 tablet    Refill:  [place], DO 11/10/2017, 12:28 PM PGY-1 [place]

## 2017-11-10 NOTE — Assessment & Plan Note (Signed)
Unchanged.  Has been occurring since 2017.  TSH WNL 05/2017.  Likely secondary to stress and menopause. -Patient encouraged to try Rogaine OTC

## 2017-11-10 NOTE — Assessment & Plan Note (Addendum)
Well-controlled.  A1c 6.5 today, unchanged from previous.  Goal A1c <7. - Recheck A1c in 3-6 months - Continue Metformin 100mg  BID - Encouraged resuming exercise and healthy diet. - Urine microalbumin today, 30-300 - Consider starting low dose ACE at next visit for kidney protection in setting of DM -repeat Urine microalbumin in 6 months.

## 2017-11-10 NOTE — Patient Instructions (Signed)
It was great to meet you!  I refilled your medications.  Please let me know if you need anything else!  We will get a sample of your urine today to check your kidneys since you are a diabetic.  If it is normal, you will get a letter in the mail in a few weeks.  Otherwise, I will call you.  Your A1c looks great today!  Keep up the great work exercising and eating well!  We will recheck your thyroid levels in 6 months.    Your hair loss is likely from stress and menopause.  You can try over-there-counter Rogaine to help with this.  Thanks again!  It was great to meet you!  Best, Dr. Sandi Carne

## 2017-11-10 NOTE — Assessment & Plan Note (Signed)
TSH normal 05/2017.  No symptoms of thyroid dysfunction.  Weight is stable, no fatigue. -Will repeat TSH in 6 months -Continue Synthroid at 68mcg QAM

## 2017-11-10 NOTE — Assessment & Plan Note (Signed)
Lipid panel WNL 07/2017.  -Cont Atorvastatin 40mg  QD -Repeat Lipid Panel in 1 year

## 2017-12-17 DIAGNOSIS — Z800 Family history of malignant neoplasm of digestive organs: Secondary | ICD-10-CM | POA: Diagnosis not present

## 2017-12-17 DIAGNOSIS — Z1211 Encounter for screening for malignant neoplasm of colon: Secondary | ICD-10-CM | POA: Diagnosis not present

## 2017-12-17 DIAGNOSIS — D120 Benign neoplasm of cecum: Secondary | ICD-10-CM | POA: Diagnosis not present

## 2017-12-17 DIAGNOSIS — K6389 Other specified diseases of intestine: Secondary | ICD-10-CM | POA: Diagnosis not present

## 2017-12-17 DIAGNOSIS — K635 Polyp of colon: Secondary | ICD-10-CM | POA: Diagnosis not present

## 2018-02-05 ENCOUNTER — Encounter: Payer: Self-pay | Admitting: Family Medicine

## 2018-02-19 DIAGNOSIS — N6459 Other signs and symptoms in breast: Secondary | ICD-10-CM | POA: Diagnosis not present

## 2018-02-25 DIAGNOSIS — M7061 Trochanteric bursitis, right hip: Secondary | ICD-10-CM | POA: Diagnosis not present

## 2018-02-25 DIAGNOSIS — M25551 Pain in right hip: Secondary | ICD-10-CM | POA: Diagnosis not present

## 2018-02-28 ENCOUNTER — Other Ambulatory Visit: Payer: Self-pay | Admitting: Family Medicine

## 2018-03-02 ENCOUNTER — Ambulatory Visit: Payer: Medicare HMO

## 2018-03-04 ENCOUNTER — Ambulatory Visit: Payer: Medicare HMO | Admitting: Family Medicine

## 2018-03-09 ENCOUNTER — Encounter (HOSPITAL_COMMUNITY): Payer: Self-pay | Admitting: *Deleted

## 2018-03-09 ENCOUNTER — Emergency Department (HOSPITAL_COMMUNITY): Payer: Medicare HMO

## 2018-03-09 ENCOUNTER — Emergency Department (HOSPITAL_COMMUNITY)
Admission: EM | Admit: 2018-03-09 | Discharge: 2018-03-09 | Disposition: A | Discharge status: Home / Self Care | Payer: Medicare HMO | Attending: Emergency Medicine | Admitting: Emergency Medicine

## 2018-03-09 DIAGNOSIS — R079 Chest pain, unspecified: Secondary | ICD-10-CM | POA: Diagnosis not present

## 2018-03-09 DIAGNOSIS — E119 Type 2 diabetes mellitus without complications: Secondary | ICD-10-CM | POA: Insufficient documentation

## 2018-03-09 DIAGNOSIS — I10 Essential (primary) hypertension: Secondary | ICD-10-CM | POA: Diagnosis not present

## 2018-03-09 DIAGNOSIS — R0789 Other chest pain: Secondary | ICD-10-CM | POA: Diagnosis present

## 2018-03-09 DIAGNOSIS — E039 Hypothyroidism, unspecified: Secondary | ICD-10-CM | POA: Diagnosis not present

## 2018-03-09 LAB — CBC
HCT: 41 % (ref 36.0–46.0)
Hemoglobin: 12.4 g/dL (ref 12.0–15.0)
MCH: 25.9 pg — ABNORMAL LOW (ref 26.0–34.0)
MCHC: 30.2 g/dL (ref 30.0–36.0)
MCV: 85.6 fL (ref 80.0–100.0)
Platelets: 271 10*3/uL (ref 150–400)
RBC: 4.79 MIL/uL (ref 3.87–5.11)
RDW: 12.6 % (ref 11.5–15.5)
WBC: 6.1 10*3/uL (ref 4.0–10.5)
nRBC: 0 % (ref 0.0–0.2)

## 2018-03-09 LAB — BASIC METABOLIC PANEL
Anion gap: 8 (ref 5–15)
BUN: 9 mg/dL (ref 6–20)
CO2: 25 mmol/L (ref 22–32)
Calcium: 9.1 mg/dL (ref 8.9–10.3)
Chloride: 107 mmol/L (ref 98–111)
Creatinine, Ser: 0.9 mg/dL (ref 0.44–1.00)
GFR calc Af Amer: >60 mL/min (ref >60)
GFR calc non Af Amer: >60 mL/min (ref >60)
Glucose, Bld: 156 mg/dL — ABNORMAL HIGH (ref 70–99)
Potassium: 3.9 mmol/L (ref 3.5–5.1)
Sodium: 140 mmol/L (ref 135–145)

## 2018-03-09 LAB — I-STAT TROPONIN, ED
Troponin i, poc: 0.01 ng/mL (ref 0.00–0.08)
Troponin i, poc: 0 ng/mL (ref 0.00–0.08)

## 2018-03-09 NOTE — ED Provider Notes (Signed)
Emergency Department Provider Note   I have reviewed the triage vital signs and the nursing notes.   HISTORY  Chief Complaint Chest Pain   HPI Lori Jones is a 57 y.o. female with multiple medical problems as documented below the presents to the emergency department today with left-sided chest pain.  No associate shortness of breath, nausea, vomiting, diaphoresis or lightheadedness.  States it is worse when she takes deep breath.  Worse when you push on it.  No productive cough or fevers. No other associated or modifying symptoms.    Past Medical History:  Diagnosis Date  . Allergy   . Anemia   . Anginal pain (HCC)    hx of CPs  . Anxiety   . Arthritis   . Asthma    hx of in childhood   . Back pain, chronic   . Depression   . Diabetes mellitus   . Heart murmur   . Herniated disc   . Hypercholesteremia   . Hypertension    no meds  . Hypothyroid   . Kidney disease    "pelvic kidney" bilat   . Scoliosis   . Seizures ([place])    hx of in childhood and again in college   . Stroke [facility])    hx of sun stroke while in college     Patient Active Problem List   Diagnosis Date Noted  . Hair loss 03/06/2016  . HLD (hyperlipidemia) 10/06/2013  . Hypothyroidism 08/06/2013  . Schizoaffective disorder, unspecified type ([place]) 07/10/2013  . Diabetes mellitus due to underlying condition without complications ([place]) 40/98/1191  . Heart murmur 07/10/2013    Past Surgical History:  Procedure Laterality Date  . ABDOMINAL HYSTERECTOMY    . CESAREAN SECTION    . CHOLECYSTECTOMY N/A 09/08/2014   Procedure: LAPAROSCOPIC CHOLECYSTECTOMY;  Surgeon: Coralie Keens, MD;  Location: [facility];  Service: General;  Laterality: N/A;  . HAND SURGERY     dog bite  . INCISIONAL HERNIA REPAIR N/A 04/24/2016   Procedure: HERNIA REPAIR INCISIONAL;  Surgeon: Coralie Keens, MD;  Location: [place];  Service: General;  Laterality: N/A;  . INSERTION OF MESH N/A 04/24/2016   Procedure: INSERTION OF  MESH;  Surgeon: Coralie Keens, MD;  Location: [place];  Service: General;  Laterality: N/A;  . Thumb surgery    . VENTRAL HERNIA REPAIR N/A 09/08/2014   Procedure: VENTRAL HERNIA REPAIR;  Surgeon: Coralie Keens, MD;  [facility];  Service: General;  Laterality: N/A;    Current Outpatient Rx  . Order #: [id number] Class: Normal  . Order #: [id number] Class: Normal  . Order #: [id number] Class: Normal  . Order #: [id number] Class: Normal  . Order #: [id number] Class: Normal    Allergies Clarithromycin; Sumatriptan; Canagliflozin; Canagliflozin-metformin hcl; Chlorhexidine; and Sumatriptan succinate  Family History  Problem Relation Age of Onset  . Cancer Mother   . Depression Mother   . Diabetes Mother   . Heart disease Mother 26       Stents  . Hypertension Mother   . Hyperlipidemia Mother   . Thyroid disease Mother   . Cancer Father   . Sickle cell anemia Brother     Social History Social History   Tobacco Use  . Smoking status: Never Smoker  . Smokeless tobacco: Never Used  Substance Use Topics  . Alcohol use: No    Alcohol/week: 0.0 standard drinks  . Drug use: No    Review of Systems  All other systems negative except as documented  in the HPI. All pertinent positives and negatives as reviewed in the HPI. ____________________________________________   PHYSICAL EXAM:  VITAL SIGNS: ED Triage Vitals  Enc Vitals Group     BP 03/09/18 2030 (!) 156/82     Pulse Rate 03/09/18 2030 64     Resp 03/09/18 2030 14     Temp --      Temp src --      SpO2 03/09/18 2030 100 %     Weight --      Height --      Head Circumference --      Peak Flow --      Pain Score 03/09/18 1802 5     Pain Loc --      Pain Edu? --      Excl. in [place]? --     Constitutional: Alert and oriented. Well appearing and in no acute distress. Eyes: Conjunctivae are normal. PERRL. EOMI. Head: Atraumatic. Nose: No congestion/rhinnorhea. Mouth/Throat: Mucous membranes are moist.  Oropharynx  non-erythematous. Neck: No stridor.  No meningeal signs.   Cardiovascular: Normal rate, regular rhythm. Good peripheral circulation. Grossly normal heart sounds.   Respiratory: Normal respiratory effort.  No retractions. Lungs CTAB. Gastrointestinal: Soft and nontender. No distention.  Musculoskeletal: No lower extremity tenderness nor edema. No gross deformities of extremities.  Chest tenderness reproducing her symptoms Neurologic:  Normal speech and language. No gross focal neurologic deficits are appreciated.  Skin:  Skin is warm, dry and intact. No rash noted.  ____________________________________________   LABS (all labs ordered are listed, but only abnormal results are displayed)  Labs Reviewed  BASIC METABOLIC PANEL - Abnormal; Notable for the following components:      Result Value   Glucose, Bld 156 (*)    All other components within normal limits  CBC - Abnormal; Notable for the following components:   MCH 25.9 (*)    All other components within normal limits  I-STAT TROPONIN, ED  I-STAT TROPONIN, ED   ____________________________________________  EKG   EKG Interpretation  Date/Time:  Monday March 09 2018 20:17:32 EST Ventricular Rate:  59 PR Interval:    QRS Duration: 96 QT Interval:  416 QTC Calculation: 413 R Axis:   48 Text Interpretation:  Sinus rhythm Abnormal R-wave progression, early transition Confirmed by Merrily Pew [phone]) on 03/09/2018 9:45:29 PM      ____________________________________________  RADIOLOGY  Dg Chest 2 View  Result Date: 03/09/2018 CLINICAL DATA:  57 year old female with chest pain for 3 days. EXAM: CHEST - 2 VIEW COMPARISON:  Chest radiograph 10/04/2016. FINDINGS: Lung volumes and mediastinal contours are within normal limits. Visualized tracheal air column is within normal limits. No pneumothorax, pleural effusion, pulmonary edema or confluent pulmonary opacity. Stable visualized osseous structures. Stable cholecystectomy  clips. Negative visible bowel gas pattern. IMPRESSION: No acute cardiopulmonary abnormality. Electronically Signed   By: Genevie Ann M.D.   On: 03/09/2018 18:54   ____________________________________________   PROCEDURES  Procedure(s) performed:   Procedures   ____________________________________________   INITIAL IMPRESSION / ASSESSMENT AND PLAN / ED COURSE  Atypical sounding chest pain with low hear score, delat troponins negative.  Doubt PE without respiratory symptoms.  No e/o pneumonia, dissection or PTX at this time.   Pertinent labs & imaging results that were available during my care of the patient were reviewed by me and considered in my medical decision making (see chart for details).  ____________________________________________  FINAL CLINICAL IMPRESSION(S) / ED DIAGNOSES  Final diagnoses:  Nonspecific chest  pain     MEDICATIONS GIVEN DURING THIS VISIT:  Medications - No data to display   NEW OUTPATIENT MEDICATIONS STARTED DURING THIS VISIT:  New Prescriptions   No medications on file    Note:  This note was prepared with assistance of Dragon voice recognition software. Occasional wrong-word or sound-a-like substitutions may have occurred due to the inherent limitations of voice recognition software.   Merrily Pew, MD 03/09/18 2146

## 2018-03-09 NOTE — ED Notes (Addendum)
Patient had someone harassing her Last week around [place].  When pain began.  Pt describes pain as pressure on left side non-radiating, about 4.5 - 5 of 10.  Pt does state the pain is somewhat less than it was last week.  Pain made worse by stress.  Denies SOB, N&V, and unusual sweating.  Lungs c,lear and equal no abnormal heart tones noted, no Bruites detected.

## 2018-03-09 NOTE — ED Triage Notes (Signed)
Pt in c/o chest pain that started on Friday, history of same but did not ever find a cause, denies shortness of breath or n/v, no distress on arrival, pain is constant

## 2018-03-10 DIAGNOSIS — Z888 Allergy status to other drugs, medicaments and biological substances status: Secondary | ICD-10-CM | POA: Diagnosis not present

## 2018-03-10 DIAGNOSIS — E785 Hyperlipidemia, unspecified: Secondary | ICD-10-CM | POA: Diagnosis not present

## 2018-03-10 DIAGNOSIS — Z7984 Long term (current) use of oral hypoglycemic drugs: Secondary | ICD-10-CM | POA: Diagnosis not present

## 2018-03-10 DIAGNOSIS — E119 Type 2 diabetes mellitus without complications: Secondary | ICD-10-CM | POA: Diagnosis not present

## 2018-03-10 DIAGNOSIS — Z833 Family history of diabetes mellitus: Secondary | ICD-10-CM | POA: Diagnosis not present

## 2018-03-10 DIAGNOSIS — Z8249 Family history of ischemic heart disease and other diseases of the circulatory system: Secondary | ICD-10-CM | POA: Diagnosis not present

## 2018-03-10 DIAGNOSIS — Z809 Family history of malignant neoplasm, unspecified: Secondary | ICD-10-CM | POA: Diagnosis not present

## 2018-03-10 DIAGNOSIS — E039 Hypothyroidism, unspecified: Secondary | ICD-10-CM | POA: Diagnosis not present

## 2018-03-10 DIAGNOSIS — Z881 Allergy status to other antibiotic agents status: Secondary | ICD-10-CM | POA: Diagnosis not present

## 2018-03-10 DIAGNOSIS — Z7982 Long term (current) use of aspirin: Secondary | ICD-10-CM | POA: Diagnosis not present

## 2018-03-17 ENCOUNTER — Ambulatory Visit (INDEPENDENT_AMBULATORY_CARE_PROVIDER_SITE_OTHER): Payer: Medicare HMO

## 2018-03-17 VITALS — BP 112/78 | HR 67 | Temp 98.5°F | Ht 69.0 in | Wt 184.0 lb

## 2018-03-17 DIAGNOSIS — Z0000 Encounter for general adult medical examination without abnormal findings: Secondary | ICD-10-CM | POA: Diagnosis not present

## 2018-03-17 NOTE — Progress Notes (Addendum)
Subjective:   Lori Jones is a 57 y.o. female who presents for Medicare Annual (Subsequent) preventive examination.  Review of Systems:  Physical assessment deferred to PCP.  Cardiac Risk Factors include: diabetes mellitus;sedentary lifestyle     Objective:     Vitals: BP 112/78   Pulse 67   Temp 98.5 F (36.9 C) (Oral)   Ht 5\' 9"  (1.753 m)   Wt 184 lb (83.5 kg)   SpO2 97%   BMI 27.17 kg/m   Body mass index is 27.17 kg/m.  Advanced Directives 03/17/2018 11/10/2017 09/17/2017 07/28/2017 11/14/2016 10/11/2016 10/04/2016  Does Patient Have a Medical Advance Directive? No No No No No No No  Would patient like information on creating a medical advance directive? Yes (MAU/Ambulatory/Procedural Areas - Information given) No - Patient declined No - Patient declined No - Patient declined No - Patient declined No - Patient declined No - Patient declined    Tobacco Social History   Tobacco Use  Smoking Status Never Smoker  Smokeless Tobacco Never Used     Counseling given: Yes   Clinical Intake:  Pre-visit preparation completed: Yes  Pain : 0-10 Pain Score: 4  Pain Location: Hip Pain Orientation: Right Pain Onset: 1 to 4 weeks ago     Nutritional Status: BMI 25 -29 Overweight Nutritional Risks: None Diabetes: Yes CBG done?: No Did pt. bring in CBG monitor from home?: No  How often do you need to have someone help you when you read instructions, pamphlets, or other written materials from your doctor or pharmacy?: 1 - Never What is the last grade level you completed in school?: college degree  Interpreter Needed?: No     Past Medical History:  Diagnosis Date  . Allergy   . Anemia   . Anginal pain (HCC)    hx of CPs  . Anxiety   . Arthritis   . Asthma    hx of in childhood   . Back pain, chronic   . Bursitis   . Depression   . Diabetes mellitus   . Heart murmur   . Herniated disc   . Hypercholesteremia   . Hypertension    no meds  . Hypothyroid     . Kidney disease    "pelvic kidney" bilat   . Scoliosis   . Seizures ([place])    hx of in childhood and again in college   . Stroke [facility])    hx of sun stroke while in college    Past Surgical History:  Procedure Laterality Date  . ABDOMINAL HYSTERECTOMY    . CESAREAN SECTION    . CHOLECYSTECTOMY N/A 09/08/2014   Procedure: LAPAROSCOPIC CHOLECYSTECTOMY;  Surgeon: Coralie Keens, MD;  [facility];  Service: General;  Laterality: N/A;  . HAND SURGERY     dog bite  . INCISIONAL HERNIA REPAIR N/A 04/24/2016   Procedure: HERNIA REPAIR INCISIONAL;  Surgeon: Coralie Keens, MD;  Location: [place];  Service: General;  Laterality: N/A;  . INSERTION OF MESH N/A 04/24/2016   Procedure: INSERTION OF MESH;  Surgeon: Coralie Keens, MD;  Location: [place];  Service: General;  Laterality: N/A;  . Thumb surgery    . VENTRAL HERNIA REPAIR N/A 09/08/2014   Procedure: VENTRAL HERNIA REPAIR;  Surgeon: Coralie Keens, MD;  [facility];  Service: General;  Laterality: N/A;   Family History  Problem Relation Age of Onset  . Cancer Mother   . Depression Mother   . Diabetes Mother   .  Heart disease Mother 22       Stents  . Hypertension Mother   . Hyperlipidemia Mother   . Thyroid disease Mother   . Cancer Father   . Sickle cell anemia Brother   . Diabetes Sister    Social History   Socioeconomic History  . Marital status: Single    Spouse name: Not on file  . Number of children: 1  . Years of education: 68  . Highest education level: Bachelor's degree (e.g., BA, AB, BS)  Occupational History  . Not on file  Social Needs  . Financial resource strain: Not very hard  . Food insecurity:    Worry: Sometimes true    Inability: Sometimes true  . Transportation needs:    Medical: No    Non-medical: No  Tobacco Use  . Smoking status: Never Smoker  . Smokeless tobacco: Never Used  Substance and Sexual Activity  . Alcohol use: No    Alcohol/week: 0.0 standard drinks  . Drug use: No   . Sexual activity: Not Currently  Lifestyle  . Physical activity:    Days per week: 0 days    Minutes per session: 0 min  . Stress: Not at all  Relationships  . Social connections:    Talks on phone: More than three times a week    Gets together: Twice a week    Attends religious service: More than 4 times per year    Active member of club or organization: No    Attends meetings of clubs or organizations: Never    Relationship status: Never married  Other Topics Concern  . Not on file  Social History Narrative   Lives alone, in a house. One level house, with two steps. Has hand rail at back. Has throw rugs. No grab bars in bathroom.    No pets.   Likes to walk and likes to read.      Likes green vegetables, eats chicken but not beef or pork. Does not like fruit. Drinks water, soda occasionally.      Wears seat belt in vehicle.     Outpatient Encounter Medications as of 03/17/2018  Medication Sig  . aspirin EC 81 MG tablet Take 1 tablet (81 mg total) by mouth daily.  Marland Kitchen atorvastatin (LIPITOR) 40 MG tablet Take 1 tablet (40 mg total) by mouth daily.  . IBU 800 MG tablet TAKE 1 TABLET BY MOUTH TWICE DAILY (Patient taking differently: Take 800 mg by mouth 2 (two) times daily. )  . levothyroxine (SYNTHROID, LEVOTHROID) 25 MCG tablet TAKE 1 TABLET BY MOUTH EVERY DAY BEFORE BREAKFAST (Patient taking differently: Take 25 mcg by mouth daily before breakfast. )  . metFORMIN (GLUCOPHAGE) 1000 MG tablet Take 1 tablet (1,000 mg total) by mouth 2 (two) times daily with a meal.   No facility-administered encounter medications on file as of 03/17/2018.     Activities of Daily Living In your present state of health, do you have any difficulty performing the following activities: 03/17/2018  Hearing? Y  Comment hearing loss in left ear; can't afford hearing aid  Vision? N  Comment wears reading glasses  Difficulty concentrating or making decisions? N  Walking or climbing stairs? N   Dressing or bathing? N  Doing errands, shopping? N  Preparing Food and eating ? Y  Using the Toilet? N  In the past six months, have you accidently leaked urine? Y  Do you have problems with loss of bowel control? N  Managing  your Medications? N  Managing your Finances? N  Housekeeping or managing your Housekeeping? N  Some recent data might be hidden    Patient Care Team: Cleophas Dunker, DO as PCP - General Kennith Center, RD as Dietitian (Family Medicine) Mariana Kaufman, RN as Case Manager Clent Jacks, MD as Consulting Physician (Ophthalmology) Juanita Craver, MD as Consulting Physician (Gastroenterology) Iran Planas, MD as Consulting Physician (Orthopedic Surgery) Coralie Keens, MD as Consulting Physician (General Surgery) Linda Hedges, DO as Consulting Physician (Obstetrics and Gynecology)    Assessment:   This is a routine wellness examination for [place].  Exercise Activities and Dietary recommendations Current Exercise Habits: The patient does not participate in regular exercise at present, Exercise limited by: orthopedic condition(s)  Goals      Patient Stated   . Eat more fruits and vegetables (pt-stated)    . Exercise 150 minutes per week (moderate activity) (pt-stated)    . Weight (lb) < 165 lb (74.8 kg) (pt-stated)     6% weight loss       Fall Risk Fall Risk  03/17/2018 12/06/2016 11/14/2016 10/11/2016 05/29/2016  Falls in the past year? 0 No No No No  Number falls in past yr: - - - - -  Injury with Fall? - - - - -  Risk Factor Category  - - - - -  Risk for fall due to : - - - - -  Follow up - - - - -   Is the patient's home free of loose throw rugs in walkways, pet beds, electrical cords, etc?   Yes       Grab bars in the bathroom? no      Handrails on the stairs?   yes      Adequate lighting?   yes   Depression Screen PHQ 2/9 Scores 03/17/2018 11/10/2017 09/17/2017 07/28/2017  PHQ - 2 Score 4 0 0 0  PHQ- 9 Score 8 - - -     Cognitive  Function MMSE - Mini Mental State Exam 03/17/2018  Orientation to time 5  Orientation to Place 5  Registration 3  Attention/ Calculation 5  Recall 3  Language- name 2 objects 2  Language- repeat 1  Language- follow 3 step command 3  Language- read & follow direction 1  Write a sentence 1  Copy design 1  Total score 30     6CIT Screen 03/17/2018  What Year? 0 points  What month? 0 points  What time? 0 points  Count back from 20 0 points  Months in reverse 0 points  Repeat phrase 0 points  Total Score 0    Immunization History  Administered Date(s) Administered  . Tdap 06/22/2015    Screening Tests Health Maintenance  Topic Date Due  . PAP SMEAR-Modifier  04/30/2018 (Originally 03/01/2018)  . INFLUENZA VACCINE  07/30/2018 (Originally 10/30/2017)  . PNEUMOCOCCAL POLYSACCHARIDE VACCINE AGE 42-64 HIGH RISK  03/18/2019 (Originally 09/02/1962)  . FOOT EXAM  05/09/2018  . HEMOGLOBIN A1C  05/13/2018  . OPHTHALMOLOGY EXAM  05/13/2018  . URINE MICROALBUMIN  11/11/2018  . MAMMOGRAM  03/22/2019  . TETANUS/TDAP  06/21/2025  . COLONOSCOPY  12/18/2027  . Hepatitis C Screening  Completed  . HIV Screening  Completed    Cancer Screenings: Lung: Low Dose CT Chest recommended if Age 75-80 years, 30 pack-year currently smoking OR have quit w/in 15years. Patient does not qualify. Breast:  Up to date on Mammogram? Yes   Up to date of Bone  Density/Dexa? Yes Colorectal: up to date   Additional Screenings: : Hepatitis C Screening: complete     Plan:  Patient declined both flu vaccine and pneumonia vaccine. Other heatlh maintenance is up to date. Patient has concerns with depression, denies SI/HI, and would like a diagnostic mammogram. Advised to discuss with PCP at upcoming appt on 03/20/18.   I have personally reviewed and noted the following in the patient's chart:   . Medical and social history . Use of alcohol, tobacco or illicit drugs  . Current medications and  supplements . Functional ability and status . Nutritional status . Physical activity . Advanced directives . List of other physicians . Hospitalizations, surgeries, and ER visits in previous 12 months . Vitals . Screenings to include cognitive, depression, and falls . Referrals and appointments  In addition, I have reviewed and discussed with patient certain preventive protocols, quality metrics, and best practice recommendations. A written personalized care plan for preventive services as well as general preventive health recommendations were provided to patient.     Esau Grew, RN  03/17/2018    I have reviewed this visit and agree with the documentation.  [place], DO 03/18/18

## 2018-03-17 NOTE — Patient Instructions (Addendum)
[name] , Thank you for taking time to come for your Medicare Wellness Visit. I appreciate your ongoing commitment to your health goals. Please review the following plan we discussed and let me know if I can assist you in the future.   Please keep your appointment with Dr. Sandi Carne on 03/20/18.  These are the goals we discussed: Goals      Patient Stated   . Eat more fruits and vegetables (pt-stated)    . Exercise 150 minutes per week (moderate activity) (pt-stated)    . Weight (lb) < 165 lb (74.8 kg) (pt-stated)     6% weight loss       This is a list of the screening recommended for you and due dates:  Health Maintenance  Topic Date Due  . Pap Smear  04/30/2018*  . Flu Shot  07/30/2018*  . Pneumococcal vaccine  03/18/2019*  . Complete foot exam   05/09/2018  . Hemoglobin A1C  05/13/2018  . Eye exam for diabetics  05/13/2018  . Urine Protein Check  11/11/2018  . Mammogram  03/22/2019  . Tetanus Vaccine  06/21/2025  . Colon Cancer Screening  12/18/2027  .  Hepatitis C: One time screening is recommended by Center for Disease Control  (CDC) for  adults born from [birth year] through 1966.   Completed  . HIV Screening  Completed  *Topic was postponed. The date shown is not the original due date.    Health Maintenance, Female Adopting a healthy lifestyle and getting preventive care can go a long way to promote health and wellness. Talk with your health care provider about what schedule of regular examinations is right for you. This is a good chance for you to check in with your provider about disease prevention and staying healthy. In between checkups, there are plenty of things you can do on your own. Experts have done a lot of research about which lifestyle changes and preventive measures are most likely to keep you healthy. Ask your health care provider for more information. Weight and diet Eat a healthy diet  Be sure to include plenty of vegetables, fruits, low-fat dairy  products, and lean protein.  Do not eat a lot of foods high in solid fats, added sugars, or salt.  Get regular exercise. This is one of the most important things you can do for your health. ? Most adults should exercise for at least 150 minutes each week. The exercise should increase your heart rate and make you sweat (moderate-intensity exercise). ? Most adults should also do strengthening exercises at least twice a week. This is in addition to the moderate-intensity exercise.  Maintain a healthy weight  Body mass index (BMI) is a measurement that can be used to identify possible weight problems. It estimates body fat based on height and weight. Your health care provider can help determine your BMI and help you achieve or maintain a healthy weight.  For females 60 years of age and older: ? A BMI below 18.5 is considered underweight. ? A BMI of 18.5 to 24.9 is normal. ? A BMI of 25 to 29.9 is considered overweight. ? A BMI of 30 and above is considered obese.  Watch levels of cholesterol and blood lipids  You should start having your blood tested for lipids and cholesterol at 58 years of age, then have this test every 5 years.  You may need to have your cholesterol levels checked more often if: ? Your lipid or cholesterol levels are high. ?  You are older than 58 years of age. ? You are at high risk for heart disease.  Cancer screening Lung Cancer  Lung cancer screening is recommended for adults 19-60 years old who are at high risk for lung cancer because of a history of smoking.  A yearly low-dose CT scan of the lungs is recommended for people who: ? Currently smoke. ? Have quit within the past 15 years. ? Have at least a 30-pack-year history of smoking. A pack year is smoking an average of one pack of cigarettes a day for 1 year.  Yearly screening should continue until it has been 15 years since you quit.  Yearly screening should stop if you develop a health problem that would  prevent you from having lung cancer treatment.  Breast Cancer  Practice breast self-awareness. This means understanding how your breasts normally appear and feel.  It also means doing regular breast self-exams. Let your health care provider know about any changes, no matter how small.  If you are in your 20s or 30s, you should have a clinical breast exam (CBE) by a health care provider every 1-3 years as part of a regular health exam.  If you are 73 or older, have a CBE every year. Also consider having a breast X-ray (mammogram) every year.  If you have a family history of breast cancer, talk to your health care provider about genetic screening.  If you are at high risk for breast cancer, talk to your health care provider about having an MRI and a mammogram every year.  Breast cancer gene (BRCA) assessment is recommended for women who have family members with BRCA-related cancers. BRCA-related cancers include: ? Breast. ? Ovarian. ? Tubal. ? Peritoneal cancers.  Results of the assessment will determine the need for genetic counseling and BRCA1 and BRCA2 testing.  Cervical Cancer Your health care provider may recommend that you be screened regularly for cancer of the pelvic organs (ovaries, uterus, and vagina). This screening involves a pelvic examination, including checking for microscopic changes to the surface of your cervix (Pap test). You may be encouraged to have this screening done every 3 years, beginning at age 91.  For women ages 66-65, health care providers may recommend pelvic exams and Pap testing every 3 years, or they may recommend the Pap and pelvic exam, combined with testing for human papilloma virus (HPV), every 5 years. Some types of HPV increase your risk of cervical cancer. Testing for HPV may also be done on women of any age with unclear Pap test results.  Other health care providers may not recommend any screening for nonpregnant women who are considered low risk  for pelvic cancer and who do not have symptoms. Ask your health care provider if a screening pelvic exam is right for you.  If you have had past treatment for cervical cancer or a condition that could lead to cancer, you need Pap tests and screening for cancer for at least 20 years after your treatment. If Pap tests have been discontinued, your risk factors (such as having a new sexual partner) need to be reassessed to determine if screening should resume. Some women have medical problems that increase the chance of getting cervical cancer. In these cases, your health care provider may recommend more frequent screening and Pap tests.  Colorectal Cancer  This type of cancer can be detected and often prevented.  Routine colorectal cancer screening usually begins at 58 years of age and continues through 58 years of  age.  Your health care provider may recommend screening at an earlier age if you have risk factors for colon cancer.  Your health care provider may also recommend using home test kits to check for hidden blood in the stool.  A small camera at the end of a tube can be used to examine your colon directly (sigmoidoscopy or colonoscopy). This is done to check for the earliest forms of colorectal cancer.  Routine screening usually begins at age 50.  Direct examination of the colon should be repeated every 5-10 years through 58 years of age. However, you may need to be screened more often if early forms of precancerous polyps or small growths are found.  Skin Cancer  Check your skin from head to toe regularly.  Tell your health care provider about any new moles or changes in moles, especially if there is a change in a mole's shape or color.  Also tell your health care provider if you have a mole that is larger than the size of a pencil eraser.  Always use sunscreen. Apply sunscreen liberally and repeatedly throughout the day.  Protect yourself by wearing long sleeves, pants, a  wide-brimmed hat, and sunglasses whenever you are outside.  Heart disease, diabetes, and high blood pressure  High blood pressure causes heart disease and increases the risk of stroke. High blood pressure is more likely to develop in: ? People who have blood pressure in the high end of the normal range (130-139/85-89 mm Hg). ? People who are overweight or obese. ? People who are African American.  If you are 18-39 years of age, have your blood pressure checked every 3-5 years. If you are 40 years of age or older, have your blood pressure checked every year. You should have your blood pressure measured twice-once when you are at a hospital or clinic, and once when you are not at a hospital or clinic. Record the average of the two measurements. To check your blood pressure when you are not at a hospital or clinic, you can use: ? An automated blood pressure machine at a pharmacy. ? A home blood pressure monitor.  If you are between 55 years and 79 years old, ask your health care provider if you should take aspirin to prevent strokes.  Have regular diabetes screenings. This involves taking a blood sample to check your fasting blood sugar level. ? If you are at a normal weight and have a low risk for diabetes, have this test once every three years after 58 years of age. ? If you are overweight and have a high risk for diabetes, consider being tested at a younger age or more often. Preventing infection Hepatitis B  If you have a higher risk for hepatitis B, you should be screened for this virus. You are considered at high risk for hepatitis B if: ? You were born in a country where hepatitis B is common. Ask your health care provider which countries are considered high risk. ? Your parents were born in a high-risk country, and you have not been immunized against hepatitis B (hepatitis B vaccine). ? You have HIV or AIDS. ? You use needles to inject street drugs. ? You live with someone who has  hepatitis B. ? You have had sex with someone who has hepatitis B. ? You get hemodialysis treatment. ? You take certain medicines for conditions, including cancer, organ transplantation, and autoimmune conditions.  Hepatitis C  Blood testing is recommended for: ? Everyone born from   1946 through 34. ? Anyone with known risk factors for hepatitis C.  Sexually transmitted infections (STIs)  You should be screened for sexually transmitted infections (STIs) including gonorrhea and chlamydia if: ? You are sexually active and are younger than 58 years of age. ? You are older than 58 years of age and your health care provider tells you that you are at risk for this type of infection. ? Your sexual activity has changed since you were last screened and you are at an increased risk for chlamydia or gonorrhea. Ask your health care provider if you are at risk.  If you do not have HIV, but are at risk, it may be recommended that you take a prescription medicine daily to prevent HIV infection. This is called pre-exposure prophylaxis (PrEP). You are considered at risk if: ? You are sexually active and do not regularly use condoms or know the HIV status of your partner(s). ? You take drugs by injection. ? You are sexually active with a partner who has HIV.  Talk with your health care provider about whether you are at high risk of being infected with HIV. If you choose to begin PrEP, you should first be tested for HIV. You should then be tested every 3 months for as long as you are taking PrEP. Pregnancy  If you are premenopausal and you may become pregnant, ask your health care provider about preconception counseling.  If you may become pregnant, take 400 to 800 micrograms (mcg) of folic acid every day.  If you want to prevent pregnancy, talk to your health care provider about birth control (contraception). Osteoporosis and menopause  Osteoporosis is a disease in which the bones lose minerals and  strength with aging. This can result in serious bone fractures. Your risk for osteoporosis can be identified using a bone density scan.  If you are 34 years of age or older, or if you are at risk for osteoporosis and fractures, ask your health care provider if you should be screened.  Ask your health care provider whether you should take a calcium or vitamin D supplement to lower your risk for osteoporosis.  Menopause may have certain physical symptoms and risks.  Hormone replacement therapy may reduce some of these symptoms and risks. Talk to your health care provider about whether hormone replacement therapy is right for you. Follow these instructions at home:  Schedule regular health, dental, and eye exams.  Stay current with your immunizations.  Do not use any tobacco products including cigarettes, chewing tobacco, or electronic cigarettes.  If you are pregnant, do not drink alcohol.  If you are breastfeeding, limit how much and how often you drink alcohol.  Limit alcohol intake to no more than 1 drink per day for nonpregnant women. One drink equals 12 ounces of beer, 5 ounces of wine, or 1 ounces of hard liquor.  Do not use street drugs.  Do not share needles.  Ask your health care provider for help if you need support or information about quitting drugs.  Tell your health care provider if you often feel depressed.  Tell your health care provider if you have ever been abused or do not feel safe at home. This information is not intended to replace advice given to you by your health care provider. Make sure you discuss any questions you have with your health care provider. Document Released: 10/01/2010 Document Revised: 08/24/2015 Document Reviewed: 12/20/2014 Elsevier Interactive Patient Education  [employer].

## 2018-03-20 ENCOUNTER — Encounter: Payer: Self-pay | Admitting: Family Medicine

## 2018-03-20 ENCOUNTER — Other Ambulatory Visit: Payer: Self-pay | Admitting: Family Medicine

## 2018-03-20 ENCOUNTER — Other Ambulatory Visit: Payer: Self-pay

## 2018-03-20 ENCOUNTER — Ambulatory Visit (INDEPENDENT_AMBULATORY_CARE_PROVIDER_SITE_OTHER): Payer: Medicare HMO | Admitting: Family Medicine

## 2018-03-20 VITALS — BP 117/71 | HR 61 | Temp 97.6°F | Wt 182.0 lb

## 2018-03-20 DIAGNOSIS — E089 Diabetes mellitus due to underlying condition without complications: Secondary | ICD-10-CM | POA: Diagnosis not present

## 2018-03-20 DIAGNOSIS — R05 Cough: Secondary | ICD-10-CM | POA: Diagnosis not present

## 2018-03-20 DIAGNOSIS — F329 Major depressive disorder, single episode, unspecified: Secondary | ICD-10-CM

## 2018-03-20 DIAGNOSIS — F32A Depression, unspecified: Secondary | ICD-10-CM

## 2018-03-20 DIAGNOSIS — R69 Illness, unspecified: Secondary | ICD-10-CM | POA: Diagnosis not present

## 2018-03-20 DIAGNOSIS — N63 Unspecified lump in unspecified breast: Secondary | ICD-10-CM | POA: Diagnosis not present

## 2018-03-20 DIAGNOSIS — K219 Gastro-esophageal reflux disease without esophagitis: Secondary | ICD-10-CM

## 2018-03-20 DIAGNOSIS — R0789 Other chest pain: Secondary | ICD-10-CM

## 2018-03-20 DIAGNOSIS — R059 Cough, unspecified: Secondary | ICD-10-CM | POA: Insufficient documentation

## 2018-03-20 LAB — POCT GLYCOSYLATED HEMOGLOBIN (HGB A1C): HbA1c, POC (controlled diabetic range): 7.1 % — AB (ref 0.0–7.0)

## 2018-03-20 MED ORDER — FAMOTIDINE 20 MG PO TABS: 20.0000 mg | ORAL_TABLET | Freq: Two times a day (BID) | ORAL | 0 refills | Status: DC

## 2018-03-20 MED ORDER — ESCITALOPRAM OXALATE 10 MG PO TABS: 10.0000 mg | ORAL_TABLET | Freq: Every day | ORAL | 0 refills | Status: DC

## 2018-03-20 NOTE — Patient Instructions (Addendum)
Thank you for coming to see me today. It was a pleasure. Today we talked about:   Depression: Will will start you on Lexapro and you should come back in 1 month to be seen.  I have provided you with numbers to psychiatrists in the area as well.  Please call them to set up an appointment.  Cough: use honey as needed.  It is likely caused by a viral illness.   Breat Lump: I ordered a diagnostic mammogram for you, please make sure that you go to this.  Reflux: I sent a prescription for Pepcid.  Takes this twice a day for the next month and see if your symptoms improve.  Chest Pain: if this worsens or becomes more frequent, please come back in.  Please follow-up with me in 1 month.  If you have any questions or concerns, please do not hesitate to call the office at [phone].  Best,   Arizona Constable, DO   [facility]: [phone] [place]:  [phone]   [facility] [address] [place], [place] [address] Monday - Friday: 8:30am-12:00pm / 1:00pm-2:30pm

## 2018-03-20 NOTE — Assessment & Plan Note (Signed)
Unsure of schizoaffective diagnosis.  On Lexapro 10mg  previously with good results.  No SI/HI at present. - restart Lexapro 10 - patient given strict return precautions and advised of SI side effects  - will recheck in 1 month

## 2018-03-20 NOTE — Progress Notes (Signed)
Subjective: Chief Complaint  Patient presents with  . Diabetes     HPI: Lori Jones is a 57 y.o. presenting to clinic today to discuss the following:  1 Depression Patient has been feeling down in the last two moths.  Had some regection at church, nephew diagnosed with cancer, best friend recently.  Has tried committing suicide over 58 years, states over 10 times.  Has been in psychiatric hospital.  Denies SI/HI at present.  Has been taking anti-depressant in the past, sopped in last 2-3 years as was doing well.  Has also seen psych in the past but stopped around the same time.    Per chart review, was on Lexapro 10mg  in the past.  States that she felt well on it and has no complications.  Has previous diagnosis of schizoaffective disorder on chart review. No hallucinations at present.     Office Visit from 03/20/2018 in [place]  PHQ-9 Total Score  5     2 Requesting diagnostic mammogram order Found knot in right breast for last 6 weeks.  Has not changed in size since she found it.  First found one on the right, now two on the left.  States that they are sore. States that the knots look like they are on top of the skin. Was seen by gyn 3 weeks ago and was given antibiotics.  The original spots improved, but are still there and did not go away completely, now has new spots on the left side. States that they started out looking like pimple and then drained.  Also notes that she sweats a lot.  3 Cough Has been having a cough for the last week.  Occasional white mucus.  Has some congestion and no runny nose. No watery eyes, no itching.  Started with a sore throat, but has improved.  Has been using cough drops with some relief.  No fevers.  Cough is becoming more frequent.  Notes some post nasal drip.  4 Chest Pain Patient seen in ED 12/9 for chest pain.  Workup negative.  Continues to have chest pain of the same nature, has not worsened or changed.  It is  reproducible to palpation and has reflux symptoms as well.  Health Maintenance: needs flu shot     ROS noted in HPI.   Past Medical, Surgical, Social, and Family History Reviewed & Updated per EMR.   Pertinent Historical Findings include: Schizoaffective Disorder   Social History   Tobacco Use  Smoking Status Never Smoker  Smokeless Tobacco Never Used      Objective: BP 117/71   Pulse 61   Temp 97.6 F (36.4 C) (Oral)   Wt 182 lb (82.6 kg)   SpO2 96%   BMI 26.88 kg/m  Vitals and nursing notes reviewed  Physical Exam: General: 57 y.o. female in NAD HEENT: patches of thinning hair on scalp Neck: supple, no thyromegaly Cardio: RRR Lungs: CTAB, no wheezing, no rhonchi, no crackles, no increased work of breathing Chest: TTP L chest wall at sternum which reproduces symptoms, left breast with healing 1 cm lesion on left lower outer quadrant of breast, full breast exam not performed at patient request as recently had exam at gynecologist and is having diagnostic mammogram Abdomen: Soft, epigastric TTO, positive bowel sounds Skin: warm and dry Extremities: No edema    Results for orders placed or performed in visit on 03/20/18 (from the past 72 hour(s))  HgB A1c  Status: Abnormal   Collection Time: 03/20/18 11:40 AM  Result Value Ref Range   Hemoglobin A1C     HbA1c POC (<> result, manual entry)     HbA1c, POC (prediabetic range)     HbA1c, POC (controlled diabetic range) 7.1 (A) 0.0 - 7.0 %    Assessment/Plan:  Gastroesophageal reflux disease Exam and history consistent with reflux. - start pepcid 20mg  BID for 1 month - follow up in 1 month  Breast lump in female Healing lesion likely 2/2 folliculitis.  Appears well-healed following treatment from gynecologist.  Gynecologist recently examined and patient having diagnostic mammogram, therefore thorough breast exam not performed. - diagnostic mammogram  Depression Unsure of schizoaffective diagnosis.  On  Lexapro 10mg  previously with good results.  No SI/HI at present. - restart Lexapro 10 - patient given strict return precautions and advised of SI side effects  - will recheck in 1 month  Diabetes mellitus due to underlying condition without complications J9E 7.1, up from 6.5.  Patient notes that she has not been dieting and exercising as well. - educated on diet and exercise, seems motivated - cont metformin  Cough Likely viral given history.  GERD could also be contributing. - can use honey for cough - to return if worsens or does not improve  Other chest pain Reassured by recent negative work up in ED and that pain is unchanged in nature since that time.  Reproducible to palpation of chest wall and epigastric region. - GERD tx - likely MSK in origin - could also be exacerbated by uncontrolled depression, will treat as appropriately - patient given strict return precautions     PATIENT EDUCATION PROVIDED: See AVS    Diagnosis and plan along with any newly prescribed medication(s) were discussed in detail with this patient today. The patient verbalized understanding and agreed with the plan. Patient advised if symptoms worsen return to clinic or ER.   Health Maintainance: refused flu shot   Orders Placed This Encounter  Procedures  . MM Digital Diagnostic Bilat    Standing Status:   Future    Standing Expiration Date:   05/22/2019    Order Specific Question:   Reason for Exam (SYMPTOM  OR DIAGNOSIS REQUIRED)    Answer:   knot in left breast    Order Specific Question:   Is the patient pregnant?    Answer:   No    Order Specific Question:   Preferred imaging location?    Answer:   [facility]  . Ambulatory referral to Psychiatry    Referral Priority:   Routine    Referral Type:   Psychiatric    Referral Reason:   Specialty Services Required    Requested Specialty:   Psychiatry    Number of Visits Requested:   1  . HgB A1c    Meds ordered this encounter    Medications  . famotidine (PEPCID) 20 MG tablet    Sig: Take 1 tablet (20 mg total) by mouth 2 (two) times daily.    Dispense:  60 tablet    Refill:  0  . escitalopram (LEXAPRO) 10 MG tablet    Sig: Take 1 tablet (10 mg total) by mouth daily.    Dispense:  30 tablet    Refill:  0     Arizona Constable, DO 03/20/2018, 12:22 PM PGY-1 [facility]

## 2018-03-20 NOTE — Assessment & Plan Note (Signed)
Likely viral given history.  GERD could also be contributing. - can use honey for cough - to return if worsens or does not improve

## 2018-03-20 NOTE — Assessment & Plan Note (Signed)
Reassured by recent negative work up in ED and that pain is unchanged in nature since that time.  Reproducible to palpation of chest wall and epigastric region. - GERD tx - likely MSK in origin - could also be exacerbated by uncontrolled depression, will treat as appropriately - patient given strict return precautions

## 2018-03-20 NOTE — Assessment & Plan Note (Signed)
A1c 7.1, up from 6.5.  Patient notes that she has not been dieting and exercising as well. - educated on diet and exercise, seems motivated - cont metformin

## 2018-03-20 NOTE — Assessment & Plan Note (Signed)
Exam and history consistent with reflux. - start pepcid 20mg  BID for 1 month - follow up in 1 month

## 2018-03-20 NOTE — Assessment & Plan Note (Signed)
Healing lesion likely 2/2 folliculitis.  Appears well-healed following treatment from gynecologist.  Gynecologist recently examined and patient having diagnostic mammogram, therefore thorough breast exam not performed. - diagnostic mammogram

## 2018-03-23 ENCOUNTER — Other Ambulatory Visit: Payer: Self-pay | Admitting: Family Medicine

## 2018-03-23 DIAGNOSIS — N63 Unspecified lump in unspecified breast: Secondary | ICD-10-CM

## 2018-03-27 ENCOUNTER — Other Ambulatory Visit: Payer: Self-pay | Admitting: Family Medicine

## 2018-03-27 DIAGNOSIS — E089 Diabetes mellitus due to underlying condition without complications: Secondary | ICD-10-CM

## 2018-03-30 ENCOUNTER — Telehealth: Payer: Self-pay | Admitting: Family Medicine

## 2018-03-30 NOTE — Telephone Encounter (Signed)
Seems there is already a referral in the system.  Will forward to [place] to send to [place]. Donnella Morford, Salome Spotted, CMA

## 2018-03-30 NOTE — Telephone Encounter (Signed)
Pt is calling and would like for Dr. Sandi Carne to place a referral for her to have her diagnostic mammogram at [facility] instead of sending her to the [facility]. She said that she has been to [facility] before and that is why she prefers to go there.

## 2018-04-07 DIAGNOSIS — N6001 Solitary cyst of right breast: Secondary | ICD-10-CM | POA: Diagnosis not present

## 2018-04-07 DIAGNOSIS — N6310 Unspecified lump in the right breast, unspecified quadrant: Secondary | ICD-10-CM | POA: Diagnosis not present

## 2018-04-09 ENCOUNTER — Encounter: Payer: Self-pay | Admitting: Family Medicine

## 2018-04-10 ENCOUNTER — Encounter: Payer: Self-pay | Admitting: Family Medicine

## 2018-04-14 ENCOUNTER — Other Ambulatory Visit: Payer: Self-pay | Admitting: Family Medicine

## 2018-04-14 DIAGNOSIS — F329 Major depressive disorder, single episode, unspecified: Secondary | ICD-10-CM

## 2018-04-14 DIAGNOSIS — F32A Depression, unspecified: Secondary | ICD-10-CM

## 2018-04-22 ENCOUNTER — Ambulatory Visit (INDEPENDENT_AMBULATORY_CARE_PROVIDER_SITE_OTHER): Payer: Medicare HMO | Admitting: Family Medicine

## 2018-04-22 ENCOUNTER — Other Ambulatory Visit: Payer: Self-pay | Admitting: Family Medicine

## 2018-04-22 ENCOUNTER — Ambulatory Visit: Payer: Medicare HMO | Admitting: Licensed Clinical Social Worker

## 2018-04-22 ENCOUNTER — Other Ambulatory Visit: Payer: Self-pay

## 2018-04-22 ENCOUNTER — Encounter: Payer: Self-pay | Admitting: Family Medicine

## 2018-04-22 VITALS — BP 132/72 | HR 75 | Temp 98.0°F | Wt 182.0 lb

## 2018-04-22 DIAGNOSIS — E089 Diabetes mellitus due to underlying condition without complications: Secondary | ICD-10-CM

## 2018-04-22 DIAGNOSIS — F329 Major depressive disorder, single episode, unspecified: Secondary | ICD-10-CM | POA: Diagnosis not present

## 2018-04-22 DIAGNOSIS — Z712 Person consulting for explanation of examination or test findings: Secondary | ICD-10-CM

## 2018-04-22 DIAGNOSIS — R69 Illness, unspecified: Secondary | ICD-10-CM | POA: Diagnosis not present

## 2018-04-22 DIAGNOSIS — Z789 Other specified health status: Secondary | ICD-10-CM

## 2018-04-22 DIAGNOSIS — E785 Hyperlipidemia, unspecified: Secondary | ICD-10-CM

## 2018-04-22 DIAGNOSIS — F32A Depression, unspecified: Secondary | ICD-10-CM

## 2018-04-22 MED ORDER — ESCITALOPRAM OXALATE 10 MG PO TABS: ORAL_TABLET | ORAL | 0 refills | Status: DC

## 2018-04-22 NOTE — Assessment & Plan Note (Signed)
Lats A1c 03/2018 was 7.1.   - cont metformin - cont to encourage diet and exercise - recheck A1c in 2 months, will also recheck TSH at that time
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
## 2018-04-22 NOTE — Assessment & Plan Note (Signed)
No SI/HI, hallucinations..  Patient agreeable to warm-handoff with integrate behavioral health today.  Recommend coping mechanisms and assistance with finding outpatient therapy.  No adverse effects from Lexapro reported. - cont Lexapro 10 mg  - return in 2 months - behavioral health warm handoff - outpatient therapy resources to be provided today by integrated care faculty - also will need assistance with housing resources

## 2018-04-22 NOTE — Progress Notes (Signed)
Subjective: Chief Complaint  Patient presents with  . Follow-up     HPI: Lori Jones is a 58 y.o. presenting to clinic today to discuss the following:  1 Depression Patient has a history of schizoaffective disorder.  At visit 1 month ago, she was placed on Lexapro 10mg  Daily.  She had been well-controlled on this previously, but had been off medications for some time.  She was having increased stress and wanted to restart medications and therapy.  She was given a list of potential places to receive therapy, but lost the paper.  She is requesting more assistance with finding therapy.  Notes that her sleep has been somewhat disturbed.   Also states that she has been feeling paranoid about letting people in her house, because her landlord is selling her home and potential buyers have been coming in.  She states that this is very bothersome to her and she doesn't know what to do.  Denies SI/HI, hallucinations.  States that she has been compliant with her Lexapro.    Depression screen [facility] 2/9 04/22/2018 03/20/2018 03/17/2018  Decreased Interest 0 1 2  Down, Depressed, Hopeless 0 1 2  PHQ - 2 Score 0 2 4  Altered sleeping 0 1 1  Tired, decreased energy 1 1 1   Change in appetite 1 0 1  Feeling bad or failure about yourself  0 1 0  Trouble concentrating 0 0 1  Moving slowly or fidgety/restless 0 0 0  Suicidal thoughts 0 0 0  PHQ-9 Score 2 5 8   Difficult doing work/chores Somewhat difficult - Somewhat difficult  Some recent data might be hidden     2 Diabetes Last A1c 7.1 on 03/20/2018.  Patient is taking metformin.  At last visit, discussed diet and exercise.  Patient states that she has been doing "well" with this.  Does states that she has been less active in the cold.  Does not regularly check her sugars.  3 Mammogram Results Patient had recent diagnostic mammogram for "bumps" on her breast.  Diagnostic mammogram and follow up ultrasound were negative.  Patient reassured by  this.  Has not had recurrence.  Health Maintenance:  Appointment scheduled for pap next week.  Patient will need diabetic eye exam in February.  ROS noted in HPI.   Past Medical, Surgical, Social, and Family History Reviewed & Updated per EMR.   Pertinent Historical Findings include:   Social History   Tobacco Use  Smoking Status Never Smoker  Smokeless Tobacco Never Used      Objective: BP 132/72   Pulse 75   Temp 98 F (36.7 C) (Oral)   Wt 182 lb (82.6 kg)   SpO2 98%   BMI 26.88 kg/m  Vitals and nursing notes reviewed  Physical Exam: General: 58 y.o. female in NAD Cardio: RRR  Lungs: CTAB, no wheezing, no rhonchi, no crackles Abdomen: Soft, non-tender to palpation, positive bowel sounds Skin: warm and dry Extremities: No edema Neuro: grossly intact Psych: mildly tearful on exam while discussing stressors, no SI/HI, no hallucinations, thought process linear and logical MSK: cervical ROM limited in extension and b/l rotation, TTP lower C-spine, TTP lumbar spine    No results found for this or any previous visit (from the past 72 hour(s)).  Assessment/Plan:  Depression No SI/HI, hallucinations..  Patient agreeable to warm-handoff with integrate behavioral health today.  Recommend coping mechanisms and assistance with finding outpatient therapy.  No adverse effects from Lexapro reported. - cont Lexapro  10 mg  - return in 2 months - behavioral health warm handoff - outpatient therapy resources to be provided today by integrated care faculty - also will need assistance with housing resources  Encounter to discuss test results Reassured patient of negative results.  Symptoms have resolved.  Diabetes mellitus due to underlying condition without complications Lats Q0Q [id number]/9444 was 7.1.   - cont metformin - cont to encourage diet and exercise - recheck A1c in 2 months, will also recheck TSH at that time     PATIENT EDUCATION PROVIDED: See AVS    Diagnosis  and plan along with any newly prescribed medication(s) were discussed in detail with this patient today. The patient verbalized understanding and agreed with the plan. Patient advised if symptoms worsen return to clinic or ER.   Health Maintainance: patient scheduled for pap, is aware that she will need to schedule dilated eye exam for next month   No orders of the defined types were placed in this encounter.   Meds ordered this encounter  Medications  . escitalopram (LEXAPRO) 10 MG tablet    Sig: TAKE 1 TABLET(10 MG) BY MOUTH DAILY    Dispense:  90 tablet    Refill:  0     [place] Constable, DO 04/22/2018, 5:27 PM PGY-1 [place]

## 2018-04-22 NOTE — Patient Instructions (Addendum)
Thank you for coming to see me today. It was a pleasure. Today we talked about:   Depression: Continue to take your Lexapro.  Use the techniques that Neoma Laming discussed with you for coping.  Call one of the places given to you by Neoma Laming to be seen for therapy.  Diabetes:  Continue to take Metformin.  Continue to do well with the diet and exercise.  Remember to schedule your eye appointment next month.  Please follow-up with me in 2 months or sooner as needed.  If you have any questions or concerns, please do not hesitate to call the office at [phone].  Best,   Ari[place] Constable, DO

## 2018-04-22 NOTE — Assessment & Plan Note (Signed)
Reassured patient of negative results.  Symptoms have resolved.

## 2018-04-30 ENCOUNTER — Other Ambulatory Visit: Payer: Self-pay

## 2018-04-30 ENCOUNTER — Emergency Department (HOSPITAL_COMMUNITY)
Admission: EM | Admit: 2018-04-30 | Discharge: 2018-04-30 | Disposition: A | Discharge status: Home / Self Care | Payer: Medicare HMO | Attending: Emergency Medicine | Admitting: Emergency Medicine

## 2018-04-30 ENCOUNTER — Encounter (HOSPITAL_COMMUNITY): Payer: Self-pay

## 2018-04-30 ENCOUNTER — Ambulatory Visit (INDEPENDENT_AMBULATORY_CARE_PROVIDER_SITE_OTHER): Payer: Medicare HMO | Admitting: Family Medicine

## 2018-04-30 ENCOUNTER — Encounter: Payer: Self-pay | Admitting: Family Medicine

## 2018-04-30 ENCOUNTER — Emergency Department (HOSPITAL_COMMUNITY): Payer: Medicare HMO

## 2018-04-30 VITALS — BP 148/76 | HR 73 | Temp 98.2°F | Ht 69.0 in | Wt 182.4 lb

## 2018-04-30 DIAGNOSIS — R51 Headache: Secondary | ICD-10-CM | POA: Insufficient documentation

## 2018-04-30 DIAGNOSIS — E039 Hypothyroidism, unspecified: Secondary | ICD-10-CM | POA: Diagnosis not present

## 2018-04-30 DIAGNOSIS — J45909 Unspecified asthma, uncomplicated: Secondary | ICD-10-CM | POA: Diagnosis not present

## 2018-04-30 DIAGNOSIS — E119 Type 2 diabetes mellitus without complications: Secondary | ICD-10-CM | POA: Diagnosis not present

## 2018-04-30 DIAGNOSIS — R42 Dizziness and giddiness: Secondary | ICD-10-CM

## 2018-04-30 DIAGNOSIS — Z79899 Other long term (current) drug therapy: Secondary | ICD-10-CM | POA: Insufficient documentation

## 2018-04-30 DIAGNOSIS — Z7982 Long term (current) use of aspirin: Secondary | ICD-10-CM | POA: Insufficient documentation

## 2018-04-30 DIAGNOSIS — Z7984 Long term (current) use of oral hypoglycemic drugs: Secondary | ICD-10-CM | POA: Insufficient documentation

## 2018-04-30 DIAGNOSIS — I10 Essential (primary) hypertension: Secondary | ICD-10-CM | POA: Diagnosis not present

## 2018-04-30 DIAGNOSIS — E089 Diabetes mellitus due to underlying condition without complications: Secondary | ICD-10-CM

## 2018-04-30 DIAGNOSIS — R110 Nausea: Secondary | ICD-10-CM | POA: Diagnosis not present

## 2018-04-30 LAB — CBC WITH DIFFERENTIAL/PLATELET
Abs Immature Granulocytes: 0.01 10*3/uL (ref 0.00–0.07)
BASOS PCT: 0 %
Basophils Absolute: 0 10*3/uL (ref 0.0–0.1)
Eosinophils Absolute: 0.1 10*3/uL (ref 0.0–0.5)
Eosinophils Relative: 1 %
HCT: 41.5 % (ref 36.0–46.0)
Hemoglobin: 12.8 g/dL (ref 12.0–15.0)
Immature Granulocytes: 0 %
Lymphocytes Relative: 48 %
Lymphs Abs: 2.4 10*3/uL (ref 0.7–4.0)
MCH: 26.4 pg (ref 26.0–34.0)
MCHC: 30.8 g/dL (ref 30.0–36.0)
MCV: 85.7 fL (ref 80.0–100.0)
Monocytes Absolute: 0.3 10*3/uL (ref 0.1–1.0)
Monocytes Relative: 7 %
Neutro Abs: 2.2 10*3/uL (ref 1.7–7.7)
Neutrophils Relative %: 44 %
PLATELETS: 253 10*3/uL (ref 150–400)
RBC: 4.84 MIL/uL (ref 3.87–5.11)
RDW: 13 % (ref 11.5–15.5)
WBC: 5 10*3/uL (ref 4.0–10.5)
nRBC: 0 % (ref 0.0–0.2)

## 2018-04-30 LAB — COMPREHENSIVE METABOLIC PANEL
ALBUMIN: 4.3 g/dL (ref 3.5–5.0)
ALT: 26 U/L (ref 0–44)
AST: 25 U/L (ref 15–41)
Alkaline Phosphatase: 69 U/L (ref 38–126)
Anion gap: 9 (ref 5–15)
BUN: 10 mg/dL (ref 6–20)
CHLORIDE: 105 mmol/L (ref 98–111)
CO2: 25 mmol/L (ref 22–32)
Calcium: 9.6 mg/dL (ref 8.9–10.3)
Creatinine, Ser: 0.66 mg/dL (ref 0.44–1.00)
GFR calc Af Amer: >60 mL/min (ref >60)
GFR calc non Af Amer: >60 mL/min (ref >60)
Glucose, Bld: 123 mg/dL — ABNORMAL HIGH (ref 70–99)
Potassium: 3.5 mmol/L (ref 3.5–5.1)
Sodium: 139 mmol/L (ref 135–145)
Total Bilirubin: 1 mg/dL (ref 0.3–1.2)
Total Protein: 7.7 g/dL (ref 6.5–8.1)

## 2018-04-30 LAB — GLUCOSE, POCT (MANUAL RESULT ENTRY): POC Glucose: 132 mg/dl — AB (ref 70–99)

## 2018-04-30 MED ORDER — SODIUM CHLORIDE 0.9 % IV BOLUS: 500.0000 mL | Freq: Once | INTRAVENOUS | Status: DC

## 2018-04-30 MED ORDER — ONDANSETRON 4 MG PO TBDP: 4.0000 mg | ORAL_TABLET | Freq: Three times a day (TID) | ORAL | 0 refills | Status: DC | PRN

## 2018-04-30 MED ORDER — MECLIZINE HCL 12.5 MG PO TABS: 12.5000 mg | ORAL_TABLET | Freq: Three times a day (TID) | ORAL | 1 refills | Status: DC | PRN

## 2018-04-30 MED ORDER — MECLIZINE HCL 25 MG PO TABS
25.0000 mg | ORAL_TABLET | Freq: Once | ORAL | Status: AC
  Administered 2018-04-30: 25 mg via ORAL
  Filled 2018-04-30: qty 1

## 2018-04-30 MED ORDER — LORAZEPAM 2 MG/ML IJ SOLN
0.5000 mg | Freq: Once | INTRAMUSCULAR | Status: AC
  Administered 2018-04-30: 0.5 mg via INTRAVENOUS
  Filled 2018-04-30: qty 1

## 2018-04-30 MED ORDER — ONDANSETRON HCL 4 MG/2ML IJ SOLN
4.0000 mg | Freq: Once | INTRAMUSCULAR | Status: AC
  Administered 2018-04-30: 4 mg via INTRAVENOUS
  Filled 2018-04-30: qty 2

## 2018-04-30 NOTE — Patient Instructions (Signed)
I am not certain what specifically is causing your dizziness/vertigo.  I sent in a medication which typically helps no matter what the specific cause.   Please see Dr. Sandi Carne in a couple of weeks to see if further testing is necessary.   Somehow you did strain your neck.  Do the usual stuff of heating pain or ice.    Try my favorite on your neck, Capsicum cream.

## 2018-04-30 NOTE — Discharge Instructions (Signed)
It was my pleasure taking care of you today!   Take Zofran as needed for nausea/vomiting.  Continue taking the meclizine you are prescribed at your doctor's office today as needed for dizziness.  As we discussed, be very careful when you are walking around.  Go very slow when moving from sitting to standing.  Turning your head quickly can make your dizziness worse.   Call your regular doctor tomorrow if you are dizziness is not getting better to schedule a follow-up appointment.  Turn to the emergency department for new or worsening symptoms, any additional concerns.

## 2018-04-30 NOTE — ED Triage Notes (Addendum)
Patient states that she began to have dizziness and nausea around 0930 today. Patient states she went to her PCP today and was prescribed Meclizine. Patient states she took Meclizine approx 1330 today.

## 2018-04-30 NOTE — Progress Notes (Signed)
   Subjective:     Lori Jones, is a 58 y.o. female   History provider by patient No interpreter necessary.  Chief Complaint  Patient presents with  . Dizziness    HPI:  Lori Jones is a 58yo F who presents with 2 hour hx of dizziness. She states she was having a DEXA scan completed this morning when she laid down on the exam table for several minutes. After some time, she noticed the room began spinning around her with some photophobia. She reported no headaches, nausea/vomiting with this episode. No recent fevers. She has not eaten today. Her blood glucose measured at the time was 124. She reports no anxiety regarding the dexa scan. She arrived at clinic today in a wheelchair due to her current symptoms, but does not usually use one. She states her vertigo in clinic has improved since this morning. About 2 days ago, she states she had an episode of L sided headache with pain radiating in her L side of her neck. She states it lasted about 30 min before going away gradually. As she left the clinic today after the initial visit concluded, she experienced another episode of worsening vertigo. She had bent down to unlock her wheelchair when she suddenly felt the room spinning around her. She reports no pain. She is tearful and reports being scared of falling as she lives at home alone.  Review of Systems  Constitutional: Positive for fatigue. Negative for fever.  Eyes: Negative for pain and visual disturbance.  Respiratory: Negative for shortness of breath.   Cardiovascular: Negative for chest pain.  Gastrointestinal: Negative for abdominal pain.    Patient's history was reviewed and updated as appropriate: allergies, current medications, past family history, past medical history, past social history, past surgical history and problem list.     Objective:     BP (!) 148/76   Pulse 73   Temp 98.2 F (36.8 C) (Oral)   Ht 5\' 9"  (1.753 m)   Wt 182 lb 6.4 oz (82.7 kg) Comment: pt  reported  SpO2 98%   BMI 26.94 kg/m   Physical Exam Cardiovascular:     Rate and Rhythm: Normal rate and regular rhythm.     Heart sounds: Normal heart sounds.  Pulmonary:     Effort: Pulmonary effort is normal.     Breath sounds: Normal breath sounds.  Neurological:     General: No focal deficit present.     Comments: Slightly wobbly gait. Negative Romberg. 5/5 strength        Assessment & Plan:   Lori Jones is a 58yo F who presents with several hour hx of vertigo. Specific etiology is uncertain, could be first episode of BPPV given movement seemingly triggering second episode. This could also be labrynthitis causing short-term vertigo. Less concern for TIA given primary prevention statin/aspirin, no other neuro deficits. However, given tearful response to second episode and social situation of living alone, it would be reasonable to go to the ER if feeling unsafe.   Vertigo -Given Meclizine 12.5mg  PO PRN for dizziness -Provided education regarding standing up slowly, turning lights on if walking at night, being careful with ambulation to avoid falls -Discussed going to ED if feeling unsafe -Follow-up with PCP in a few weeks to determine if further imaging/testing is needed  Supportive care and return precautions reviewed.  No follow-ups on file.  Boykin Peek, Medical Student

## 2018-04-30 NOTE — Assessment & Plan Note (Signed)
Almost certainly vestibular.  She does give some inconclusive history that neck turning is associated with the waves of vertigo she is experiencing, so BPPV is possible/likely.  If not BPPV, viral labyrinthitis is possible.  While cerebellar or brainstem is in the differential, it is unlikely by H&PE and from a risk factor standpoint, she is on an aspirin and statin already.  Spent considerable time counseling about home safety.  Rise slowly. No sharp head turning.  Lights on at night if she gets up.  Someone will stay with her today.  If she does not feel safe, return to ER.  FU 2 weeks with PCP.  Hopefully the vertigo will have resolved or the story will be more clear (such as the association with head turning.)  Meclizine for non specific RX. May also help her  With anxiety.  >50% of visit was counseling.  Duration of visit was 25 minutes face-to-face

## 2018-04-30 NOTE — ED Notes (Addendum)
Pt ambulated > 30 ft. With minimal assist. Gait unsteady at first, but became steady after taking a few steps. Pt reported feeling "a little dizzy" when ambulating. No other symptoms reported. RN aware.

## 2018-04-30 NOTE — ED Provider Notes (Signed)
[facility] DEPT Provider Note   CSN: [id number] Arrival date & time: 04/30/18  1639     History   Chief Complaint Chief Complaint  Patient presents with  . Dizziness    HPI Lori Jones is a 58 y.o. female.  The history is provided by the patient and medical records. No language interpreter was used.  Dizziness  Associated symptoms: nausea   Associated symptoms: no vomiting    Lori Jones is a 58 y.o. female  with a PMH as listed below who presents to the Emergency Department complaining of dizziness which began acutely this morning around 9:30 AM.  Patient reports that her symptoms suddenly began and it felt as if the room was spinning.  It gets worse anytime she moves her head or moves from sitting to standing.  Nauseous, but has not thrown up.  She has a diffuse headache across the frontal aspect of her head.  She denies any history of similar in the past.  No history of vertigo specifically.  She saw her primary care doctor today who thought this was likely vertigo and gave her a prescription for meclizine.  She took 1 dose and it did not help.  She actually had 3 episodes of dizziness "back-to-back" shortly after taking the medication.  She states that her symptoms will be constant for nearly an hour, then resolve for 15 to 20 minutes and then return again.  She denies any numbness, tingling, weakness, slurred speech or trouble with her grip.  She has had difficulty with ambulation due to balance issues.  She believes this is due to the dizziness worsening with ambulation.   Past Medical History:  Diagnosis Date  . Allergy   . Anemia   . Anginal pain (HCC)    hx of CPs  . Anxiety   . Arthritis   . Asthma    hx of in childhood   . Back pain, chronic   . Bursitis   . Depression   . Diabetes mellitus   . Heart murmur   . Herniated disc   . Hypercholesteremia   . Hypertension    no meds  . Hypothyroid   . Kidney disease    "pelvic  kidney" bilat   . Scoliosis   . Seizures ([place])    hx of in childhood and again in college   . Stroke [facility])    hx of sun stroke while in college     Patient Active Problem List   Diagnosis Date Noted  . Vertigo 04/30/2018  . Encounter to discuss test results 04/22/2018  . Depression 03/20/2018  . Breast lump in female 03/20/2018  . Cough 03/20/2018  . Gastroesophageal reflux disease 03/20/2018  . Other chest pain 10/04/2016  . Hair loss 03/06/2016  . HLD (hyperlipidemia) 10/06/2013  . Hypothyroidism 08/06/2013  . Schizoaffective disorder, unspecified type ([place]) 07/10/2013  . Diabetes mellitus due to underlying condition without complications ([place]) 35/32/9924  . Heart murmur 07/10/2013    Past Surgical History:  Procedure Laterality Date  . ABDOMINAL HYSTERECTOMY    . CESAREAN SECTION    . CHOLECYSTECTOMY N/A 09/08/2014   Procedure: LAPAROSCOPIC CHOLECYSTECTOMY;  Surgeon: Coralie Keens, MD;  Location: [facility];  Service: General;  Laterality: N/A;  . HAND SURGERY     dog bite  . INCISIONAL HERNIA REPAIR N/A 04/24/2016   Procedure: HERNIA REPAIR INCISIONAL;  Surgeon: Coralie Keens, MD;  Location: [facility];  Service: General;  Laterality: N/A;  .  INSERTION OF MESH N/A 04/24/2016   Procedure: INSERTION OF MESH;  Surgeon: Coralie Keens, MD;  Location: [facility];  Service: General;  Laterality: N/A;  . Thumb surgery    . VENTRAL HERNIA REPAIR N/A 09/08/2014   Procedure: VENTRAL HERNIA REPAIR;  Surgeon: Coralie Keens, MD;  Location: [facility];  Service: General;  Laterality: N/A;     OB History   No obstetric history on file.      Home Medications    Prior to Admission medications   Medication Sig Start Date End Date Taking? Authorizing Provider  aspirin EC 81 MG tablet Take 1 tablet (81 mg total) by mouth daily. 05/09/17  Yes Carlyle Dolly, MD  atorvastatin (LIPITOR) 40 MG tablet TAKE 1 TABLET BY MOUTH DAILY 04/22/18  Yes Meccariello, Bernita Raisin, DO  escitalopram (LEXAPRO)  10 MG tablet TAKE 1 TABLET(10 MG) BY MOUTH DAILY 04/22/18  Yes Meccariello, Mel Almond J, DO  IBU 800 MG tablet TAKE 1 TABLET BY MOUTH TWICE DAILY Patient taking differently: Take 800 mg by mouth 2 (two) times daily as needed for moderate pain.  11/15/16  Yes Carlyle Dolly, MD  levothyroxine (SYNTHROID, LEVOTHROID) 25 MCG tablet TAKE 1 TABLET BY MOUTH EVERY DAY BEFORE BREAKFAST Patient taking differently: Take 25 mcg by mouth daily before breakfast.  03/04/18  Yes Meccariello, Bernita Raisin, DO  meclizine (ANTIVERT) 12.5 MG tablet Take 1 tablet (12.5 mg total) by mouth 3 (three) times daily as needed for dizziness. 04/30/18  Yes Hensel, Jamal Collin, MD  metFORMIN (GLUCOPHAGE) 1000 MG tablet TAKE 1 TABLET BY MOUTH TWICE DAILY WITH A MEAL Patient taking differently: Take 500 mg by mouth 2 (two) times daily with a meal.  03/29/18  Yes Meccariello, Bernita Raisin, DO  famotidine (PEPCID) 20 MG tablet TAKE 1 TABLET(20 MG) BY MOUTH TWICE DAILY Patient not taking: Reported on 04/30/2018 03/23/18   Meccariello, Bernita Raisin, DO  ondansetron (ZOFRAN ODT) 4 MG disintegrating tablet Take 1 tablet (4 mg total) by mouth every 8 (eight) hours as needed for nausea or vomiting. 04/30/18   Idriss Quackenbush, Ozella Almond, PA-C    Family History Family History  Problem Relation Age of Onset  . Cancer Mother   . Depression Mother   . Diabetes Mother   . Heart disease Mother 53       Stents  . Hypertension Mother   . Hyperlipidemia Mother   . Thyroid disease Mother   . Cancer Father   . Sickle cell anemia Brother   . Diabetes Sister     Social History Social History   Tobacco Use  . Smoking status: Never Smoker  . Smokeless tobacco: Never Used  Substance Use Topics  . Alcohol use: No    Alcohol/week: 0.0 standard drinks  . Drug use: No     Allergies   Clarithromycin; Sumatriptan; Canagliflozin; Canagliflozin-metformin hcl; Chlorhexidine; and Sumatriptan succinate   Review of Systems Review of Systems  Gastrointestinal:  Positive for nausea. Negative for abdominal pain and vomiting.  Neurological: Positive for dizziness.  All other systems reviewed and are negative.    Physical Exam Updated Vital Signs BP 140/78 (BP Location: Left Arm)   Pulse 61   Temp 98.4 F (36.9 C) (Oral)   Resp 18   Ht 5\' 9"  (1.753 m)   Wt 82.7 kg   SpO2 99%   BMI 26.92 kg/m   Physical Exam Vitals signs and nursing note reviewed.  Constitutional:      General: She is not in  acute distress.    Appearance: She is well-developed.  HENT:     Head: Normocephalic and atraumatic.  Eyes:     Comments: Horizontal nystagmus.  Neck:     Musculoskeletal: Neck supple.  Cardiovascular:     Rate and Rhythm: Normal rate and regular rhythm.     Heart sounds: Normal heart sounds. No murmur.  Pulmonary:     Effort: Pulmonary effort is normal. No respiratory distress.     Breath sounds: Normal breath sounds.  Abdominal:     General: There is no distension.     Palpations: Abdomen is soft.     Tenderness: There is no abdominal tenderness.  Skin:    General: Skin is warm and dry.  Neurological:     Mental Status: She is alert and oriented to person, place, and time.     Comments: Speech clear and goal oriented. CN 2-12 grossly intact. No drift. Strength and sensation intact.      ED Treatments / Results  Labs (all labs ordered are listed, but only abnormal results are displayed) Labs Reviewed  COMPREHENSIVE METABOLIC PANEL - Abnormal; Notable for the following components:      Result Value   Glucose, Bld 123 (*)    All other components within normal limits  CBC WITH DIFFERENTIAL/PLATELET    EKG None  Radiology Mr Brain Wo Contrast  Result Date: 04/30/2018 CLINICAL DATA:  Dizziness and nausea EXAM: MRI HEAD WITHOUT CONTRAST TECHNIQUE: Multiplanar, multiecho pulse sequences of the brain and surrounding structures were obtained without intravenous contrast. COMPARISON:  None. FINDINGS: BRAIN: There is no acute infarct,  acute hemorrhage, hydrocephalus or extra-axial collection. The midline structures are normal. No midline shift or other mass effect. The white matter signal is normal for the patient's age. The cerebral and cerebellar volume are age-appropriate. Susceptibility-sensitive sequences show no chronic microhemorrhage or superficial siderosis. VASCULAR: Major intracranial arterial and venous sinus flow voids are normal. SKULL AND UPPER CERVICAL SPINE: Calvarial bone marrow signal is normal. There is no skull base mass. Visualized upper cervical spine and soft tissues are normal. SINUSES/ORBITS: No fluid levels or advanced mucosal thickening. No mastoid or middle ear effusion. The orbits are normal. IMPRESSION: Normal brain. Electronically Signed   By: Ulyses Jarred M.D.   On: 04/30/2018 20:26    Procedures Procedures (including critical care time)  Medications Ordered in ED Medications  sodium chloride 0.9 % bolus 500 mL (has no administration in time range)  meclizine (ANTIVERT) tablet 25 mg (25 mg Oral Given 04/30/18 1911)  LORazepam (ATIVAN) injection 0.5 mg (0.5 mg Intravenous Given 04/30/18 1906)  ondansetron (ZOFRAN) injection 4 mg (4 mg Intravenous Given 04/30/18 1905)     Initial Impression / Assessment and Plan / ED Course  I have reviewed the triage vital signs and the nursing notes.  Pertinent labs & imaging results that were available during my care of the patient were reviewed by me and considered in my medical decision making (see chart for details).    Lori Jones is a 58 y.o. female who presents to ED for assistant dizziness which began acutely this morning around 9:30 AM.  It does sound more vertiginous in nature, although she has no history of vertigo or similar symptoms. She saw her primary care doctor this morning who gave her meclizine which did not seem to help at all. Upon initial evaluation, she had not had any vomiting, but did have one episode here in the emergency department  shortly after I  examined her.  Movements of her head very much exacerbated her symptoms and the episode of emesis was just following this.  Afebrile and hemodynamically stable.  Horizontal nystagmus on exam.  No focal neuro deficits appreciated.  Given her acute onset symptoms and no history of similar, MRI was obtained to rule out cerebellar or brainstem stroke.  Fortunately, her MR was normal.  She has ambulated in the emergency department with steady gait.  We discussed home care instructions and safety precautions.  Encouraged PCP follow-up.  Reasons to return to the emergency department were discussed and all questions were answered.  Patient discussed with Dr. Ralene Bathe who agrees with treatment plan.    Final Clinical Impressions(s) / ED Diagnoses   Final diagnoses:  Vertigo    ED Discharge Orders         Ordered    ondansetron (ZOFRAN ODT) 4 MG disintegrating tablet  Every 8 hours PRN     04/30/18 2213           Bow Buntyn, Ozella Almond, PA-C 04/30/18 2220    Quintella Reichert, MD 05/04/18 [id number]

## 2018-04-30 NOTE — Assessment & Plan Note (Signed)
BS in our office and at another office when sx began were both normal.  No evidence that hypoglycemia is contributing to her symptoms.

## 2018-05-01 ENCOUNTER — Other Ambulatory Visit: Payer: Self-pay

## 2018-05-01 ENCOUNTER — Ambulatory Visit (INDEPENDENT_AMBULATORY_CARE_PROVIDER_SITE_OTHER): Payer: Medicare HMO | Admitting: Family Medicine

## 2018-05-01 ENCOUNTER — Encounter: Payer: Self-pay | Admitting: Family Medicine

## 2018-05-01 VITALS — BP 118/74 | HR 64 | Temp 98.4°F | Ht 69.0 in | Wt 181.4 lb

## 2018-05-01 DIAGNOSIS — H8110 Benign paroxysmal vertigo, unspecified ear: Secondary | ICD-10-CM

## 2018-05-01 NOTE — Progress Notes (Signed)
Subjective: Chief Complaint  Patient presents with  . Follow-up     HPI: Lori Jones is a 58 y.o. presenting to clinic today to discuss the following:  1 Vertigo ED follow up Patient seen in clinic yesterday and was given Rx for meclizine.  Went to ED last night for severe nausea and vomiting.  Had MRI which was negative for stroke of intracranial cause.  CBC, CMP, CBG WNL.   Vomited in ED.  Was given Zofran Rx.  Patient states that she is feeling much better.  She took zofran this AM.  Dizziness worsens with movement.  Thinks that she is 70% back to normal.  No associated hearing loss or tinnitus.  Patient has not had any recent illnesses.     ROS noted in HPI.   Past Medical, Surgical, Social, and Family History Reviewed & Updated per EMR.   Pertinent Historical Findings include:   Social History   Tobacco Use  Smoking Status Never Smoker  Smokeless Tobacco Never Used      Objective: BP 118/74   Pulse 64   Temp 98.4 F (36.9 C) (Oral)   Ht 5\' 9"  (1.753 m)   Wt 181 lb 6.4 oz (82.3 kg)   SpO2 98%   BMI 26.79 kg/m  Vitals and nursing notes reviewed  Physical Exam: General: 58 y.o. female in NAD HEENT: NCAT, PERRL, EOMI, no nystagmus Neuro: CN II-XII grossly intact, negative finger to nose, ambulates well Dix-Hallpike deferred per patient request  Results for orders placed or performed during the hospital encounter of 04/30/18 (from the past 72 hour(s))  CBC with Differential     Status: None   Collection Time: 04/30/18  7:14 PM  Result Value Ref Range   WBC 5.0 4.0 - 10.5 K/uL   RBC 4.84 3.87 - 5.11 MIL/uL   Hemoglobin 12.8 12.0 - 15.0 g/dL   HCT 41.5 36.0 - 46.0 %   MCV 85.7 80.0 - 100.0 fL   MCH 26.4 26.0 - 34.0 pg   MCHC 30.8 30.0 - 36.0 g/dL   RDW 13.0 11.5 - 15.5 %   Platelets 253 150 - 400 K/uL   nRBC 0.0 0.0 - 0.2 %   Neutrophils Relative % 44 %   Neutro Abs 2.2 1.7 - 7.7 K/uL   Lymphocytes Relative 48 %   Lymphs Abs 2.4 0.7 - 4.0 K/uL     Monocytes Relative 7 %   Monocytes Absolute 0.3 0.1 - 1.0 K/uL   Eosinophils Relative 1 %   Eosinophils Absolute 0.1 0.0 - 0.5 K/uL   Basophils Relative 0 %   Basophils Absolute 0.0 0.0 - 0.1 K/uL   Immature Granulocytes 0 %   Abs Immature Granulocytes 0.01 0.00 - 0.07 K/uL    Comment: Performed at [facility], [place] [address] [place], [place] [address]  Comprehensive metabolic panel     Status: Abnormal   Collection Time: 04/30/18  7:14 PM  Result Value Ref Range   Sodium 139 135 - 145 mmol/L   Potassium 3.5 3.5 - 5.1 mmol/L   Chloride 105 98 - 111 mmol/L   CO2 25 22 - 32 mmol/L   Glucose, Bld 123 (H) 70 - 99 mg/dL   BUN 10 6 - 20 mg/dL   Creatinine, Ser 0.66 0.44 - 1.00 mg/dL   Calcium 9.6 8.9 - 10.3 mg/dL   Total Protein 7.7 6.5 - 8.1 g/dL   Albumin 4.3 3.5 - 5.0 g/dL  AST 25 15 - 41 U/L   ALT 26 0 - 44 U/L   Alkaline Phosphatase 69 38 - 126 U/L   Total Bilirubin 1.0 0.3 - 1.2 mg/dL   GFR calc non Af Amer >60 >60 mL/min   GFR calc Af Amer >60 >60 mL/min   Anion gap 9 5 - 15    Comment: Performed at [facility], [place] [address], [place], [place] [address]    Assessment/Plan:  Benign paroxysmal positional vertigo MRI negative for stroke/TIA.  No recent illness therefore doubt viral labyrinthitis.  No hearing loss or tinnitus, therefore doubt Meniere's.  Suspect BPPV given history and otherwise negative work-up. - cont meclizine TID PRN - use zofran sparingly and only if symptoms are worse, discussed avoiding medications together as can be sedating - reutnr precautions given - referral to vestibular PT     PATIENT EDUCATION PROVIDED: See AVS    Diagnosis and plan along with any newly prescribed medication(s) were discussed in detail with this patient today. The patient verbalized understanding and agreed with the plan. Patient advised if symptoms worsen return to clinic or ER.     Orders Placed This Encounter  Procedures   . PT vestibular rehab    Standing Status:   Future    Standing Expiration Date:   05/02/2019    No orders of the defined types were placed in this encounter.    [place] Constable, DO 05/01/2018, 3:05 PM PGY-1 [facility]

## 2018-05-01 NOTE — Assessment & Plan Note (Signed)
MRI negative for stroke/TIA.  No recent illness therefore doubt viral labyrinthitis.  No hearing loss or tinnitus, therefore doubt Meniere's.  Suspect BPPV given history and otherwise negative work-up. - cont meclizine TID PRN - use zofran sparingly and only if symptoms are worse, discussed avoiding medications together as can be sedating - reutnr precautions given - referral to vestibular PT

## 2018-05-01 NOTE — Patient Instructions (Addendum)
Thank you for coming to see me today. It was a pleasure. Today we talked about:   Your dizziness.  Continue the Meclizine as needed.  If you get worse, or do not get better in 1 week, please call.    I have placed a referral to physical therapy for rehab for your vertigo.  If you do not hear from them in the next 2 weeks, please give [place] a call.  If you have any questions or concerns, please do not hesitate to call the office at [phone].  Best,   [place] Constable, DO   Benign Positional Vertigo Vertigo is the feeling that you or your surroundings are moving when they are not. Benign positional vertigo is the most common form of vertigo. This is usually a harmless condition (benign). This condition is positional. This means that symptoms are triggered by certain movements and positions. This condition can be dangerous if it occurs while you are doing something that could cause harm to you or others. This includes activities such as driving or operating machinery. What are the causes? In many cases, the cause of this condition is not known. It may be caused by a disturbance in an area of the inner ear that helps your brain to sense movement and balance. This disturbance can be caused by:  Viral infection (labyrinthitis).  Head injury.  Repetitive motion, such as jumping, dancing, or running. What increases the risk? You are more likely to develop this condition if:  You are a woman.  You are 58 years of age or older. What are the signs or symptoms? Symptoms of this condition usually happen when you move your head or your eyes in different directions. Symptoms may start suddenly, and usually last for less than a minute. They include:  Loss of balance and falling.  Feeling like you are spinning or moving.  Feeling like your surroundings are spinning or moving.  Nausea and vomiting.  Blurred vision.  Dizziness.  Involuntary eye movement (nystagmus). Symptoms can be  mild and cause only minor problems, or they can be severe and interfere with daily life. Episodes of benign positional vertigo may return (recur) over time. Symptoms may improve over time. How is this diagnosed? This condition may be diagnosed based on:  Your medical history.  Physical exam of the head, neck, and ears.  Tests, such as: ? MRI. ? CT scan. ? Eye movement tests. Your health care provider may ask you to change positions quickly while he or she watches you for symptoms of benign positional vertigo, such as nystagmus. Eye movement may be tested with a variety of exams that are designed to evaluate or stimulate vertigo. ? An electroencephalogram (EEG). This records electrical activity in your brain. ? Hearing tests. You may be referred to a health care provider who specializes in ear, nose, and throat (ENT) problems (otolaryngologist) or a provider who specializes in disorders of the nervous system (neurologist). How is this treated?  This condition may be treated in a session in which your health care provider moves your head in specific positions to adjust your inner ear back to normal. Treatment for this condition may take several sessions. Surgery may be needed in severe cases, but this is rare. In some cases, benign positional vertigo may resolve on its own in 2-4 weeks. Follow these instructions at home: Safety  Move slowly. Avoid sudden body or head movements or certain positions, as told by your health care provider.  Avoid driving until  your health care provider says it is safe for you to do so.  Avoid operating heavy machinery until your health care provider says it is safe for you to do so.  Avoid doing any tasks that would be dangerous to you or others if vertigo occurs.  If you have trouble walking or keeping your balance, try using a cane for stability. If you feel dizzy or unstable, sit down right away.  Return to your normal activities as told by your health  care provider. Ask your health care provider what activities are safe for you. General instructions  Take over-the-counter and prescription medicines only as told by your health care provider.  Drink enough fluid to keep your urine pale yellow.  Keep all follow-up visits as told by your health care provider. This is important. Contact a health care provider if:  You have a fever.  Your condition gets worse or you develop new symptoms.  Your family or friends notice any behavioral changes.  You have nausea or vomiting that gets worse.  You have numbness or a "pins and needles" sensation. Get help right away if you:  Have difficulty speaking or moving.  Are always dizzy.  Faint.  Develop severe headaches.  Have weakness in your legs or arms.  Have changes in your hearing or vision.  Develop a stiff neck.  Develop sensitivity to light. Summary  Vertigo is the feeling that you or your surroundings are moving when they are not. Benign positional vertigo is the most common form of vertigo.  The cause of this condition is not known. It may be caused by a disturbance in an area of the inner ear that helps your brain to sense movement and balance.  Symptoms include loss of balance and falling, feeling that you or your surroundings are moving, nausea and vomiting, and blurred vision.  This condition can be diagnosed based on symptoms, physical exam, and other tests, such as MRI, CT scan, eye movement tests, and hearing tests.  Follow safety instructions as told by your health care provider. You will also be told when to contact your health care provider in case of problems. This information is not intended to replace advice given to you by your health care provider. Make sure you discuss any questions you have with your health care provider. Document Released: 12/24/2005 Document Revised: 08/27/2017 Document Reviewed: 08/27/2017 Elsevier Interactive Patient Education  2019  [employer].

## 2018-05-08 ENCOUNTER — Encounter: Payer: Self-pay | Admitting: Licensed Clinical Social Worker

## 2018-05-08 ENCOUNTER — Telehealth: Payer: Self-pay | Admitting: Licensed Clinical Social Worker

## 2018-05-08 NOTE — Telephone Encounter (Signed)
  F/U call to patient reference getting connected with resources.   Patient reports she continues to feel stressed with housing concerns, [place] contacted patient from [place] referral and provided her with a list of housing resources. She continues to take Lexapro daily however admits to missing 3 to 4 does when she was sick. Patient has not looked for counseling resources due to her recent illness and working with Legal aid about her housing. She admits that she needs to spend time working on this.  Patient appreciative of F/U call Intervention: Motivational Interviewing and Supportive Counseling, discussed importance of ongoing therapy.  Plan:  1. Patient will contact her insurance provider for a list of in network therapist  2. LCSW will F/U in 5 to 7 days.  [name], [facility]   [phone] 2:51 PM

## 2018-05-18 ENCOUNTER — Other Ambulatory Visit: Payer: Self-pay | Admitting: Family Medicine

## 2018-05-18 DIAGNOSIS — F329 Major depressive disorder, single episode, unspecified: Secondary | ICD-10-CM

## 2018-05-18 DIAGNOSIS — F32A Depression, unspecified: Secondary | ICD-10-CM

## 2018-05-19 ENCOUNTER — Encounter: Payer: Self-pay | Admitting: Family Medicine

## 2018-05-19 ENCOUNTER — Ambulatory Visit (INDEPENDENT_AMBULATORY_CARE_PROVIDER_SITE_OTHER): Payer: Medicare HMO | Admitting: Family Medicine

## 2018-05-19 VITALS — BP 118/80 | HR 79 | Temp 98.2°F | Wt 185.4 lb

## 2018-05-19 DIAGNOSIS — R69 Illness, unspecified: Secondary | ICD-10-CM | POA: Diagnosis not present

## 2018-05-19 DIAGNOSIS — F329 Major depressive disorder, single episode, unspecified: Secondary | ICD-10-CM | POA: Diagnosis not present

## 2018-05-19 DIAGNOSIS — R42 Dizziness and giddiness: Secondary | ICD-10-CM | POA: Diagnosis not present

## 2018-05-19 DIAGNOSIS — F32A Depression, unspecified: Secondary | ICD-10-CM

## 2018-05-19 NOTE — Assessment & Plan Note (Signed)
Patient's vertigo is now resolved.  Suspect that this was likely BPPV.  Reassured as patient is now grossly neurologically intact and asymptomatic.  2 beat nystagmus WNL.

## 2018-05-19 NOTE — Patient Instructions (Signed)
Thank you for coming to see me today. It was a pleasure. Today we talked about:   Your vertigo: it has resolved.  Come back if starts again.  Your mood: It seems to be doing better.  I have refilled your Lexapro.  Keep working on finding a therapist.  Please follow-up with me in about 1 month for your diabetes and thyroid.  If you have any questions or concerns, please do not hesitate to call the office at [phone].  Best,   [place] Constable, DO

## 2018-05-19 NOTE — Assessment & Plan Note (Addendum)
Patient's PHQ 9 score 2.  Patient states that her mood is overall doing well and is very happy about this.  She would like to continue on the Lexapro as she believes it is working.  No SI.  Behavioral health integrated care continues to be involved in finding patient a therapist.  Patient also appreciated of of help with housing. -Refill sent for Lexapro -Continue to encourage patient find therapist for cognitive behavioral therapy, educated on this importance of combining with medication -Return precautions given -Patient to return in 1 month for follow-up of diabetes and thyroid

## 2018-05-19 NOTE — Progress Notes (Signed)
Subjective: No chief complaint on file.    HPI: Lori Jones is a 58 y.o. presenting to clinic today to discuss the following:  1 Vertigo F/U Patient presents for f/u of vertigo.  States that after she was seen in the office, she experienced some symptoms throughout the rest of that day, with improvement overtime in the next week.  Patient states that she has been "100% back to normal for the last week."  She states that she is no longer taking meclizine and zofran.  She did not go to vesitbular PT.  Denies headaches, vision changes, dizziness, focal weakness.  2 Depression F/U Patient was started on Lexapro in December.  [facility] assisting with finding patient therapist and housing resources.  Patient states that overall her mood has greatly improved.  She states that she believes the Lexapro is helping and she has been taking it reguarly, with only occasional missed doses when she forgets.  She is very appreciative of the housing resources that she has been given and feels optimistic about her housing situation.  She continues to state that she would like to establish care with a therapist, and now that she feels better, states that she is very motivated to do this. Denies SI/HI.    Office Visit from 05/19/2018 in [facility]  PHQ-9 Total Score  2      GAD 7 : Generalized Anxiety Score 05/19/2018  Nervous, Anxious, on Edge 1  Control/stop worrying 0  Worry too much - different things 1  Trouble relaxing 0  Restless 0  Easily annoyed or irritable 0  Afraid - awful might happen 1  Total GAD 7 Score 3       Health Maintenance: planning for eye appointment because she wanted to wait for vertigo to be resolved     ROS noted in HPI.   Past Medical, Surgical, Social, and Family History Reviewed & Updated per EMR.   Pertinent Historical Findings include:   Social History   Tobacco Use  Smoking Status Never Smoker  Smokeless  Tobacco Never Used      Objective: BP 118/80   Pulse 79   Temp 98.2 F (36.8 C) (Oral)   Wt 185 lb 6.4 oz (84.1 kg)   SpO2 99%   BMI 27.38 kg/m  Vitals and nursing notes reviewed  Physical Exam: General: 58 y.o. female in NAD HEENT: PERRL, EOMI, 2 beats lateral nystagmus to left, negative Dix-Hallpike Cardio: RRR no m/r/g Lungs: CTAB, no wheezing, no rhonchi, no crackles, no increased WOB Psych: Mood and affect appropriate for circumstance, denies SI/HI Neuro: Alert and oriented, CN II-XII grossly intact    No results found for this or any previous visit (from the past 72 hour(s)).  Assessment/Plan:  Vertigo Patient's vertigo is now resolved.  Suspect that this was likely BPPV.  Reassured as patient is now grossly neurologically intact and asymptomatic.  2 beat nystagmus WNL.  Depression Patient's PHQ 9 score 2.  Patient states that her mood is overall doing well and is very happy about this.  She would like to continue on the Lexapro as she believes it is working.  No SI.  Behavioral health integrated care continues to be involved in finding patient a therapist.  Patient also appreciated of of help with housing. -Refill sent for Lexapro -Continue to encourage patient find therapist for cognitive behavioral therapy, educated on this importance of combining with medication -Return precautions given -Patient to return  in 1 month for follow-up of diabetes and thyroid     PATIENT EDUCATION PROVIDED: See AVS    Diagnosis and plan along with any newly prescribed medication(s) were discussed in detail with this patient today. The patient verbalized understanding and agreed with the plan. Patient advised if symptoms worsen return to clinic or ER.   Health Maintainance: patient to schedule appointments with ophthalmology and gynecology   No orders of the defined types were placed in this encounter.   No orders of the defined types were placed in this  encounter.    [place] Constable, DO 05/19/2018, 3:22 PM PGY-1 [place]

## 2018-06-04 ENCOUNTER — Other Ambulatory Visit: Payer: Self-pay | Admitting: Family Medicine

## 2018-06-22 ENCOUNTER — Ambulatory Visit: Payer: Medicare HMO | Admitting: Family Medicine

## 2018-09-09 DIAGNOSIS — Z20828 Contact with and (suspected) exposure to other viral communicable diseases: Secondary | ICD-10-CM | POA: Diagnosis not present

## 2018-09-11 ENCOUNTER — Encounter: Payer: Self-pay | Admitting: Family Medicine

## 2018-09-11 ENCOUNTER — Ambulatory Visit (INDEPENDENT_AMBULATORY_CARE_PROVIDER_SITE_OTHER): Payer: Medicare HMO | Admitting: Family Medicine

## 2018-09-11 ENCOUNTER — Other Ambulatory Visit: Payer: Self-pay

## 2018-09-11 VITALS — BP 130/70 | HR 83 | Ht 69.0 in | Wt 184.1 lb

## 2018-09-11 DIAGNOSIS — E039 Hypothyroidism, unspecified: Secondary | ICD-10-CM | POA: Diagnosis not present

## 2018-09-11 DIAGNOSIS — E089 Diabetes mellitus due to underlying condition without complications: Secondary | ICD-10-CM

## 2018-09-11 DIAGNOSIS — E785 Hyperlipidemia, unspecified: Secondary | ICD-10-CM

## 2018-09-11 DIAGNOSIS — W57XXXA Bitten or stung by nonvenomous insect and other nonvenomous arthropods, initial encounter: Secondary | ICD-10-CM | POA: Insufficient documentation

## 2018-09-11 DIAGNOSIS — F32A Depression, unspecified: Secondary | ICD-10-CM

## 2018-09-11 DIAGNOSIS — F329 Major depressive disorder, single episode, unspecified: Secondary | ICD-10-CM

## 2018-09-11 LAB — POCT GLYCOSYLATED HEMOGLOBIN (HGB A1C): HbA1c, POC (controlled diabetic range): 6.9 % (ref 0.0–7.0)

## 2018-09-11 NOTE — Patient Instructions (Signed)
Thank you for coming to see me today. It was a pleasure. Today we talked about:   Your tick bites: they look good. If you get a fever or rash, come back.  Diabetes: Your A1c is 6.9! GREAT JOB!!!  Please follow-up with me in 3 months or sooner as needed.  I will call you with your results if they are abnormal, otherwise you will get a letter.  If you have any questions or concerns, please do not hesitate to call the office at [phone].  Best,   [place] Constable, DO

## 2018-09-11 NOTE — Assessment & Plan Note (Signed)
Patient advised to continue Lexapro at current dose.  Also encouraged to continue working on finding an outpatient therapist.

## 2018-09-11 NOTE — Assessment & Plan Note (Signed)
Obtain lipid panel today. -Continue atorvastatin 40 mg daily

## 2018-09-11 NOTE — Progress Notes (Signed)
Subjective: Chief Complaint  Patient presents with  . Insect Bite     HPI: Lori Jones is a 58 y.o. presenting to clinic today to discuss the following:  1 Tick Bites Patient presents with concern for tick bites which she received about 1 week ago when she was hiking for her birthday with her daughter.  She states that she noticed them the evening after they returned.  She had one on her left inner thigh and one on her right inner thigh.  She states that she removed both of these as soon as she saw them.  She denies any rashes, fevers, pain.  She states that she has been feeling in her normal state of health.  Patient also took pictures of the bugs that bit her to show this provider.   2 Depression Patient was started on Lexapro in December, and states that she has been doing well with this since that time.  She states that she feels that her anxiety and depression have greatly improved.  She notes that she is still working to find an outpatient therapist, but states that she would like someone who "is connected with my church, but also has confidentiality."  She also states that her housing issues have been resolved, as her landlord decided not to sell her house.  3 hypothyroidism Patient has a history of hypothyroidism and takes Synthroid 25 mcg daily.  Last TSH checked on 2/19 was within normal limits.  She denies unwanted weight loss/gain, fatigue.  She states that her hair loss is stable.   Health Maintenance: Due for A1c, TSH, lipid panel     ROS noted in HPI.   Past Medical, Surgical, Social, and Family History Reviewed & Updated per EMR.   Pertinent Historical Findings include:   Social History   Tobacco Use  Smoking Status Never Smoker  Smokeless Tobacco Never Used      Objective: BP 130/70   Pulse 83   Ht 5\' 9"  (1.753 m)   Wt 184 lb 2 oz (83.5 kg)   SpO2 98%   BMI 27.19 kg/m  Vitals and nursing notes reviewed  Physical Exam:  General: 58 y.o.  female in NAD Neck: No goiter, no cervical lymphadenopathy Cardio: RRR no m/r/g Lungs: CTAB, no wheezing, no rhonchi, no crackles, no IWOB on RA Abdomen: Soft, non-tender to palpation, non-distended, positive bowel sounds Skin: warm and dry, no rashes noted Extremities: No edema, small excoriation on left superior inner thigh without surrounding erythema, rash.  No evidence of bull's-eye lesion.  Small linear excoriation on right superior inner thigh, well-healing, no surrounding erythema, rash.  No evidence of bull's-eye lesion.  Photos appear consistent with tick   Results for orders placed or performed in visit on 09/11/18 (from the past 72 hour(s))  HgB A1c     Status: None   Collection Time: 09/11/18  2:54 PM  Result Value Ref Range   Hemoglobin A1C     HbA1c POC (<> result, manual entry)     HbA1c, POC (prediabetic range)     HbA1c, POC (controlled diabetic range) 6.9 0.0 - 7.0 %    Assessment/Plan:  Tick bite Reassured as excoriations appear to be well healing, no evidence of bull's-eye lesion, very low suspicion for Lyme.  Reassured that patient remove the ticks in less than 24 hours.  Also reassured the patient has been without fevers or other symptoms.  Patient given appropriate return precautions if she were to develop symptoms.  She voiced understanding.  Also advised to use bug spray when she is hiking.  Hypothyroidism Check TSH.  Continue Synthroid at current dose, will adjust as needed.  Diabetes mellitus due to underlying condition without complications X7L improved to 6.9 from 7.1.  Patient congratulated on this achievement as she has been very active and working hard on her health. -Continue metformin 1000 mg twice daily -Continue diet and exercise  HLD (hyperlipidemia) Obtain lipid panel today. -Continue atorvastatin 40 mg daily  Depression Patient advised to continue Lexapro at current dose.  Also encouraged to continue working on finding an outpatient  therapist.     PATIENT EDUCATION PROVIDED: See AVS    Diagnosis and plan along with any newly prescribed medication(s) were discussed in detail with this patient today. The patient verbalized understanding and agreed with the plan. Patient advised if symptoms worsen return to clinic or ER.     Orders Placed This Encounter  Procedures  . TSH  . Lipid Panel  . HgB A1c    No orders of the defined types were placed in this encounter.    [place] Constable, DO 09/11/2018, 6:52 PM PGY-1 [place]

## 2018-09-11 NOTE — Assessment & Plan Note (Signed)
A1c improved to 6.9 from 7.1.  Patient congratulated on this achievement as she has been very active and working hard on her health. -Continue metformin 1000 mg twice daily -Continue diet and exercise

## 2018-09-11 NOTE — Assessment & Plan Note (Signed)
Check TSH.  Continue Synthroid at current dose, will adjust as needed.

## 2018-09-11 NOTE — Assessment & Plan Note (Signed)
Reassured as excoriations appear to be well healing, no evidence of bull's-eye lesion, very low suspicion for Lyme.  Reassured that patient remove the ticks in less than 24 hours.  Also reassured the patient has been without fevers or other symptoms.  Patient given appropriate return precautions if she were to develop symptoms.  She voiced understanding.  Also advised to use bug spray when she is hiking.

## 2018-09-12 LAB — LIPID PANEL
Chol/HDL Ratio: 3 ratio (ref 0.0–4.4)
Cholesterol, Total: 109 mg/dL (ref 100–199)
HDL: 36 mg/dL — ABNORMAL LOW (ref >39)
LDL Calculated: 49 mg/dL (ref 0–99)
Triglycerides: 118 mg/dL (ref 0–149)
VLDL Cholesterol Cal: 24 mg/dL (ref 5–40)

## 2018-09-12 LAB — TSH: TSH: 2.86 u[IU]/mL (ref 0.450–4.500)

## 2018-10-01 DIAGNOSIS — N958 Other specified menopausal and perimenopausal disorders: Secondary | ICD-10-CM | POA: Diagnosis not present

## 2018-10-01 DIAGNOSIS — Z6827 Body mass index (BMI) 27.0-27.9, adult: Secondary | ICD-10-CM | POA: Diagnosis not present

## 2018-10-01 DIAGNOSIS — Z01419 Encounter for gynecological examination (general) (routine) without abnormal findings: Secondary | ICD-10-CM | POA: Diagnosis not present

## 2018-11-26 DIAGNOSIS — R69 Illness, unspecified: Secondary | ICD-10-CM | POA: Diagnosis not present

## 2018-11-26 DIAGNOSIS — R079 Chest pain, unspecified: Secondary | ICD-10-CM | POA: Diagnosis not present

## 2018-11-26 DIAGNOSIS — I10 Essential (primary) hypertension: Secondary | ICD-10-CM | POA: Diagnosis not present

## 2018-11-30 ENCOUNTER — Other Ambulatory Visit: Payer: Self-pay | Admitting: Family Medicine

## 2019-03-17 ENCOUNTER — Other Ambulatory Visit: Payer: Self-pay | Admitting: Family Medicine

## 2019-03-17 DIAGNOSIS — E089 Diabetes mellitus due to underlying condition without complications: Secondary | ICD-10-CM

## 2019-04-23 ENCOUNTER — Ambulatory Visit (HOSPITAL_COMMUNITY)
Admission: EM | Admit: 2019-04-23 | Discharge: 2019-04-23 | Disposition: A | Discharge status: Home / Self Care | Payer: Medicare HMO | Attending: Family Medicine | Admitting: Family Medicine

## 2019-04-23 ENCOUNTER — Ambulatory Visit (INDEPENDENT_AMBULATORY_CARE_PROVIDER_SITE_OTHER): Payer: Medicare HMO

## 2019-04-23 ENCOUNTER — Other Ambulatory Visit: Payer: Self-pay

## 2019-04-23 ENCOUNTER — Encounter (HOSPITAL_COMMUNITY): Payer: Self-pay

## 2019-04-23 DIAGNOSIS — R0789 Other chest pain: Secondary | ICD-10-CM

## 2019-04-23 DIAGNOSIS — R079 Chest pain, unspecified: Secondary | ICD-10-CM | POA: Diagnosis not present

## 2019-04-23 MED ORDER — PREDNISONE 20 MG PO TABS: 20.0000 mg | ORAL_TABLET | Freq: Two times a day (BID) | ORAL | 0 refills | Status: DC

## 2019-04-23 NOTE — ED Provider Notes (Signed)
[facility]    CSN: [id number] Arrival date & time: 04/23/19  1527      History   Chief Complaint Chief Complaint  Patient presents with  . Chest Pain    HPI Lori Jones is a 59 y.o. female.   HPI  Patient has right chest pain.  She states it is been present "for 36 hours".  She states that she was taking a walk yesterday, not exerting herself, "strollling" and she felt a pain.  Is been present since then.  It did not keep her awake when she tried to sleep last night.  Hurts with deep breath.  Hurts with movement of right arm.  No radiation.  Feels like a sharp pain. No associated symptoms such as nausea, diaphoresis, lightheadedness or dizziness.  No palpitations. Patient had an exercise stress test in 2015 that was negative. Patient had an emergency room visit in December 2019 for chest pain with negative EKG, labs, troponins, and chest x-ray Patient's old chart is reviewed She does have a heart murmur.  Diabetes and hypertension.  Hyperlipidemia.  GERD.  She has never had any coronary artery disease or atherosclerosis that she knows of.   Past Medical History:  Diagnosis Date  . Allergy   . Anemia   . Anginal pain (HCC)    hx of CPs  . Anxiety   . Arthritis   . Asthma    hx of in childhood   . Back pain, chronic   . Bursitis   . Depression   . Diabetes mellitus   . Heart murmur   . Herniated disc   . Hypercholesteremia   . Hypertension    no meds  . Hypothyroid   . Kidney disease    "pelvic kidney" bilat   . Scoliosis   . Seizures ([place])    hx of in childhood and again in college   . Stroke [facility])    hx of sun stroke while in college     Patient Active Problem List   Diagnosis Date Noted  . Tick bite 09/11/2018  . Benign paroxysmal positional vertigo 05/01/2018  . Vertigo 04/30/2018  . Encounter to discuss test results 04/22/2018  . Depression 03/20/2018  . Breast lump in female 03/20/2018  . Cough 03/20/2018  . Gastroesophageal reflux  disease 03/20/2018  . Other chest pain 10/04/2016  . Hair loss 03/06/2016  . HLD (hyperlipidemia) 10/06/2013  . Hypothyroidism 08/06/2013  . Schizoaffective disorder, unspecified type ([place]) 07/10/2013  . Diabetes mellitus due to underlying condition without complications ([place]) 123456  . Heart murmur 07/10/2013    Past Surgical History:  Procedure Laterality Date  . ABDOMINAL HYSTERECTOMY    . CESAREAN SECTION    . CHOLECYSTECTOMY N/A 09/08/2014   Procedure: LAPAROSCOPIC CHOLECYSTECTOMY;  Surgeon: Coralie Keens, MD;  Location: [facility];  Service: General;  Laterality: N/A;  . HAND SURGERY     dog bite  . INCISIONAL HERNIA REPAIR N/A 04/24/2016   Procedure: HERNIA REPAIR INCISIONAL;  Surgeon: Coralie Keens, MD;  Location: [facility];  Service: General;  Laterality: N/A;  . INSERTION OF MESH N/A 04/24/2016   Procedure: INSERTION OF MESH;  Surgeon: Coralie Keens, MD;  Location: [facility];  Service: General;  Laterality: N/A;  . Thumb surgery    . VENTRAL HERNIA REPAIR N/A 09/08/2014   Procedure: VENTRAL HERNIA REPAIR;  Surgeon: Coralie Keens, MD;  Location: [facility];  Service: General;  Laterality: N/A;    OB History   No obstetric  history on file.      Home Medications    Prior to Admission medications   Medication Sig Start Date End Date Taking? Authorizing Provider  aspirin EC 81 MG tablet Take 1 tablet (81 mg total) by mouth daily. 05/09/17   Carlyle Dolly, MD  atorvastatin (LIPITOR) 40 MG tablet TAKE 1 TABLET BY MOUTH DAILY 04/22/18   Meccariello, Bernita Raisin, DO  escitalopram (LEXAPRO) 10 MG tablet TAKE 1 TABLET(10 MG) BY MOUTH DAILY 05/19/18   Meccariello, Bernita Raisin, DO  famotidine (PEPCID) 20 MG tablet TAKE 1 TABLET(20 MG) BY MOUTH TWICE DAILY Patient not taking: Reported on 04/30/2018 03/23/18   Meccariello, Bernita Raisin, DO  IBU 800 MG tablet TAKE 1 TABLET BY MOUTH TWICE DAILY Patient taking differently: Take 800 mg by mouth 2 (two) times daily as needed for moderate pain.   11/15/16   Carlyle Dolly, MD  levothyroxine (SYNTHROID) 25 MCG tablet TAKE 1 TABLET(25 MCG) BY MOUTH DAILY BEFORE BREAKFAST 12/01/18   Meccariello, Bernita Raisin, DO  meclizine (ANTIVERT) 12.5 MG tablet Take 1 tablet (12.5 mg total) by mouth 3 (three) times daily as needed for dizziness. 04/30/18   Zenia Resides, MD  metFORMIN (GLUCOPHAGE) 1000 MG tablet TAKE 1 TABLET BY MOUTH TWICE DAILY WITH A MEAL 03/17/19   Meccariello, Bernita Raisin, DO  ondansetron (ZOFRAN ODT) 4 MG disintegrating tablet Take 1 tablet (4 mg total) by mouth every 8 (eight) hours as needed for nausea or vomiting. 04/30/18   Ward, Ozella Almond, PA-C  predniSONE (DELTASONE) 20 MG tablet Take 1 tablet (20 mg total) by mouth 2 (two) times daily with a meal. 04/23/19   Raylene Everts, MD    Family History Family History  Problem Relation Age of Onset  . Cancer Mother   . Depression Mother   . Diabetes Mother   . Heart disease Mother 30       Stents  . Hypertension Mother   . Hyperlipidemia Mother   . Thyroid disease Mother   . Cancer Father   . Sickle cell anemia Brother   . Diabetes Sister     Social History Social History   Tobacco Use  . Smoking status: Never Smoker  . Smokeless tobacco: Never Used  Substance Use Topics  . Alcohol use: No    Alcohol/week: 0.0 standard drinks  . Drug use: No     Allergies   Clarithromycin, Sumatriptan, Canagliflozin, Canagliflozin-metformin hcl, Chlorhexidine, and Sumatriptan succinate   Review of Systems Review of Systems  Constitutional: Negative for appetite change, chills and fever.  Respiratory: Negative for cough and shortness of breath.   Cardiovascular: Positive for chest pain. Negative for palpitations.  Gastrointestinal: Negative for abdominal pain, nausea and vomiting.  Genitourinary: Negative for dysuria and frequency.  Musculoskeletal: Negative for myalgias.  Neurological: Negative for dizziness.     Physical Exam Triage Vital Signs ED Triage  Vitals  Enc Vitals Group     BP 04/23/19 1546 132/72     Pulse Rate 04/23/19 1546 74     Resp 04/23/19 1546 20     Temp 04/23/19 1546 98.3 F (36.8 C)     Temp Source 04/23/19 1546 Oral     SpO2 04/23/19 1546 99 %     Weight --      Height --      Head Circumference --      Peak Flow --      Pain Score 04/23/19 1543 8     Pain  Loc --      Pain Edu? --      Excl. in [place]? --    No data found.  Updated Vital Signs BP 132/72 (BP Location: Right Arm)   Pulse 74   Temp 98.3 F (36.8 C) (Oral)   Resp 20   SpO2 99%       Physical Exam Constitutional:      General: She is not in acute distress.    Appearance: She is well-developed.  HENT:     Head: Normocephalic and atraumatic.     Mouth/Throat:     Comments: Mask is in place Eyes:     Conjunctiva/sclera: Conjunctivae normal.     Pupils: Pupils are equal, round, and reactive to light.  Neck:     Vascular: No carotid bruit.  Cardiovascular:     Rate and Rhythm: Normal rate and regular rhythm.     Heart sounds: Normal heart sounds.  Pulmonary:     Effort: Pulmonary effort is normal. No respiratory distress.     Breath sounds: Normal breath sounds. No wheezing or rales.  Chest:     Chest wall: Tenderness present.    Abdominal:     General: Abdomen is flat. There is no distension.     Palpations: Abdomen is soft.     Tenderness: There is no abdominal tenderness. There is no guarding or rebound.  Musculoskeletal:        General: Normal range of motion.     Cervical back: Normal range of motion and neck supple.  Lymphadenopathy:     Cervical: No cervical adenopathy.  Skin:    General: Skin is warm and dry.  Neurological:     Mental Status: She is alert.      UC Treatments / Results  Labs (all labs ordered are listed, but only abnormal results are displayed) Labs Reviewed - No data to display  EKG   Radiology DG Chest 2 View  Result Date: 04/23/2019 CLINICAL DATA:  Right-sided chest pain for 36 hours.  History of a heart murmur. EXAM: CHEST - 2 VIEW COMPARISON:  03/09/2018. FINDINGS: Cardiac silhouette is normal in size. Normal mediastinal and hilar contours. Lungs are clear.  No pleural effusion or pneumothorax. Shortened right fifth rib which is likely developmental. This is stable. No acute skeletal abnormality. IMPRESSION: No active cardiopulmonary disease. Electronically Signed   By: Lajean Manes M.D.   On: 04/23/2019 17:19    Procedures Procedures (including critical care time)  Medications Ordered in UC Medications - No data to display  Initial Impression / Assessment and Plan / UC Course  I have reviewed the triage vital signs and the nursing notes.  Pertinent labs & imaging results that were available during my care of the patient were reviewed by me and considered in my medical decision making (see chart for details).     Review of the EKG is compared with prior and is unchanged/normal Review of the chest x-ray, in comparison with prior it is unchanged Physical examination shows clear chest wall tenderness Discussed with patient Final Clinical Impressions(s) / UC Diagnoses   Final diagnoses:  Chest wall pain     Discharge Instructions     Take prednisone 2 times a day for 5 days After the prednisone go back to ibuprofen 2 times a day with food Take this medicine until the pain goes away Call your primary care doctor if you are still having any pain on Monday Use ice or heat to your  right chest and ribs for pain relief    ED Prescriptions    Medication Sig Dispense Auth. Provider   predniSONE (DELTASONE) 20 MG tablet Take 1 tablet (20 mg total) by mouth 2 (two) times daily with a meal. 10 tablet Raylene Everts, MD     PDMP not reviewed this encounter.   Raylene Everts, MD 04/23/19 [phone]

## 2019-04-23 NOTE — Discharge Instructions (Addendum)
Take prednisone 2 times a day for 5 days After the prednisone go back to ibuprofen 2 times a day with food Take this medicine until the pain goes away Call your primary care doctor if you are still having any pain on Monday Use ice or heat to your right chest and ribs for pain relief

## 2019-04-23 NOTE — ED Triage Notes (Signed)
Pt reports having right sided chest pain since last night. Chest pain is worse when pt is taking a deep breath.

## 2019-05-06 ENCOUNTER — Ambulatory Visit: Payer: Self-pay | Admitting: Licensed Clinical Social Worker

## 2019-05-06 ENCOUNTER — Other Ambulatory Visit: Payer: Self-pay

## 2019-05-06 ENCOUNTER — Ambulatory Visit (INDEPENDENT_AMBULATORY_CARE_PROVIDER_SITE_OTHER): Payer: Medicare HMO | Admitting: Family Medicine

## 2019-05-06 VITALS — BP 122/60 | HR 69 | Ht 69.0 in | Wt 186.0 lb

## 2019-05-06 DIAGNOSIS — E089 Diabetes mellitus due to underlying condition without complications: Secondary | ICD-10-CM | POA: Diagnosis not present

## 2019-05-06 DIAGNOSIS — R0789 Other chest pain: Secondary | ICD-10-CM

## 2019-05-06 LAB — POCT UA - MICROALBUMIN
Albumin/Creatinine Ratio, Urine, POC: <30
Creatinine, POC: 200 mg/dL
Microalbumin Ur, POC: 30 mg/L

## 2019-05-06 LAB — POCT GLYCOSYLATED HEMOGLOBIN (HGB A1C): HbA1c, POC (controlled diabetic range): 7.2 % — AB (ref 0.0–7.0)

## 2019-05-06 NOTE — Patient Instructions (Signed)
It was great to see you!  Our plans for today:  - See below for counseling resources. - See the attachment for help with housing. - Come back in 1 month (or virtual visit) with your primary doctor to see how you are doing. - If your chest pain gets worse or changes, especially with exerting yourself, go to the Emergency Department.   Take care and seek immediate care sooner if you develop any concerns.   Dr. [facility]   Therapy and Counseling Resources Most providers on this list will take Medicaid. Patients with commercial insurance or Medicare should contact their insurance company to get a list of in network providers.  [place] [address], [place], [place] [address]       [phone]     [facility] [address] [place], [place]    Jinny Blossom [facility] [address] [place], [place]  [facility]:  [place]. [place]  [facility]:  [place] [address], [place]  [facility] (under & uninsured) [address], [place] [phone]    [email or web address] [place]     Phone:  [phone]     [place]  [place] [address]      [place], [place] ext [place]: [place], [place], [place]   [place] ([place] therapist) [address] [place] [address]   [place] [address]    [phone]    [facility]   [place],  [place], [place]   [employer] [place]  [place]  [facility]  [address] [place], [place]        [phone]  Open Access/Walk In  Clinic under & uninsured [place],  [address] [place] [phone]):  Mon - Fri from 8 AM - 3 PM  [facility] of the [place],  ([place])   [place], [place]: [phone]) 8:30 - 12; 1 - 2:30  Family Service of the [place],  [place], [place]    ([phone]):8:30 - 12; 2 - 3PM  [facility] [employer],  [address]  [place]; [phone]):   Mon - Fri 8 AM - 5 PM  [facility] [place]  MWF 12:30 to 3:00 or call to schedule an appointment  [phone]  Specific Provider options Psychology Today  [email or web address] 1. click on find a therapist  2. enter your zip code 3. left side and select or tailor a therapist for your specific need.   [facility] Provider Directory [email or web address]  (Medicaid)   Follow all drop down to find a provider  [place] or [email or web address] [address], [place], [place] Recovery support and educational   In home counseling [place] Telephone: [phone]  office in [email or web address] [email or web address]   Does not take reg. Medicaid or Medicare private insurance [facility], [place] health Choice, [facility], [place], [place], [place], [place] Health Choice  24- Hour Availability:  . [place] or [phone]  .  Family Service of the [employer] [phone]  [facility] Crisis Service  [phone]   . [place]  [phone] (after hours)  . Therapeutic Alternative/Mobile Crisis   [phone]  . [place] National Suicide Hotline  [phone] ([place])  . Call 911 or go to emergency room  . [employer]  [phone]);  [place] and [employer]   . [employer] ACCESS  [phone]); [place], [place], [place], [place], [place], [place], [place]

## 2019-05-06 NOTE — Assessment & Plan Note (Addendum)
Likely related to depression and recent stressful events including death of multiple family members within the last month as well as housing instability.  Recently evaluated by urgent care with EKG and CXR that were unchanged and within normal limits.  Symptoms are atypical for cardiac etiology given timing, duration, precipitating factors.  Also considered but extremely low likelihood of PE, pneumonia, dissection given the above, overall well-appearing, normal vitals.  PHQ9 10 today, patient tearful on exam.  Given recent cardiac work-up with atypical features without change in symptoms, did not obtain repeat imaging or EKG today.  Did discuss titration of antidepressant, however patient is resistant to this given she was previously well controlled prior to death of family members.  Provided counseling and housing resources.  Follow-up in 1 month for reassessment, would repeat PHQ9 at that time.  Return and emergency precautions discussed including worsening of chest pain, see AVS for details.

## 2019-05-06 NOTE — Chronic Care Management (AMB) (Signed)
   Social Work  Care Management Consultation  05/06/2019 Name: Lori Jones MRN: [id number] DOB: 09-06-1960  Lori Jones is a 59 y.o. year old female who sees Meccariello, Bernita Raisin, DO for primary care. LCSW was consulted by Dr. Ky Barban for information /resources to assistance patient with  Housing resources. Patient was not interviewed or contacted during this encounter however LCSW reviewed chart, notes, insurance .    Recommendation: After consultation with provider it is determined that patient may benefit from contacting [employer] to obtain housing resources and a list of housing options based on her income .    Intervention: Relevant information and resources discussed and shared with provider. Provider will give information to patient.   Review of patient status, including review of consultants reports, relevant laboratory and other test results, and collaboration with appropriate care team members and the patient's provider was performed as part of comprehensive patient evaluation and provision of chronic care management services.    Plan: The care management team is available to follow up with the patient after provider's conversation regarding recommendation and referral to the care management team if further intervention is needed.  No further follow up required by LCSW at this time  [name], [name]   [phone] 11:13 AM

## 2019-05-06 NOTE — Progress Notes (Signed)
   CHIEF COMPLAINT / HPI: Chest pain, stress  CHEST PAIN - Seen in urgent care 1/22 for same.  At that time EKG, CXR within normal limits and unchanged.  Exam notable for chest wall tenderness and thought to be MSK etiology.  Given prednisone x5 days followed by ibuprofen. - Reports chest pain has been constant since December. - Last week and Tuesday chest pain was worse.  - pain feels like something is on her chest.  - R sided, central location - She thinks stress is causing pain. - brother, mother, and grandfather died in 05-11-2022. Also having difficulties with her section 8 housing who is threatening to evict her. She wants housing resources.  - Thinking about housing situation makes the pain worse.  Reports doing her mind off of her stress and walking relieves the pain. - Pain does not radiate - PMH notable for diabetes, HLD - Denies nausea, vomiting, shortness of breath, cough, swelling of legs, syncope, heartburn.  Endorses occasional pain with deep breathing.   Depression screen [facility] 2/9 05/06/2019 05/06/2019 09/11/2018  Decreased Interest 1 0 0  Down, Depressed, Hopeless 1 0 0  PHQ - 2 Score 2 0 0  Altered sleeping 2 - -  Tired, decreased energy 2 - -  Change in appetite 2 - -  Feeling bad or failure about yourself  1 - -  Trouble concentrating - - -  Moving slowly or fidgety/restless 1 - -  Suicidal thoughts 0 - -  PHQ-9 Score 10 - -  Difficult doing work/chores - - -  Some recent data might be hidden    PERTINENT  PMH / PSH: GERD, diabetes, hypothyroidism, schizoaffective disorder, HLD, depression  OBJECTIVE: BP 122/60   Pulse 69   Ht 5\' 9"  (1.753 m)   Wt 186 lb (84.4 kg)   SpO2 98%   BMI 27.47 kg/m   General: well nourished, well developed, in no acute distress with non-toxic appearance CV: regular rate and rhythm without murmurs, rubs, or gallops, no lower extremity edema Lungs: clear to auscultation bilaterally with normal work of breathing Neuro: Alert and  oriented, speech normal Psych: Well dressed and appropriately groomed.  Tearful.  No flight of ideas or tangential thought process or pressured speech.   ASSESSMENT / PLAN:  Other chest pain Likely related to depression and recent stressful events including death of multiple family members within the last month as well as housing instability.  Recently evaluated by urgent care with EKG and CXR that were unchanged and within normal limits.  Symptoms are atypical for cardiac etiology given timing, duration, precipitating factors.  Also considered but extremely low likelihood of PE, pneumonia, dissection given the above, overall well-appearing, normal vitals.  PHQ9 10 today, patient tearful on exam.  Given recent cardiac work-up with atypical features without change in symptoms, did not obtain repeat imaging or EKG today.  Did discuss titration of antidepressant, however patient is resistant to this given she was previously well controlled prior to death of family members.  Provided counseling and housing resources.  Follow-up in 1 month for reassessment, would repeat PHQ9 at that time.  Return and emergency precautions discussed including worsening of chest pain, see AVS for details.   Rory Percy, [place]

## 2019-05-25 DIAGNOSIS — F3162 Bipolar disorder, current episode mixed, moderate: Secondary | ICD-10-CM | POA: Diagnosis not present

## 2019-05-25 DIAGNOSIS — R69 Illness, unspecified: Secondary | ICD-10-CM | POA: Diagnosis not present

## 2019-05-25 DIAGNOSIS — F411 Generalized anxiety disorder: Secondary | ICD-10-CM | POA: Diagnosis not present

## 2019-05-27 DIAGNOSIS — F411 Generalized anxiety disorder: Secondary | ICD-10-CM | POA: Diagnosis not present

## 2019-05-27 DIAGNOSIS — F3162 Bipolar disorder, current episode mixed, moderate: Secondary | ICD-10-CM | POA: Diagnosis not present

## 2019-05-27 DIAGNOSIS — R69 Illness, unspecified: Secondary | ICD-10-CM | POA: Diagnosis not present

## 2019-05-28 ENCOUNTER — Other Ambulatory Visit: Payer: Self-pay | Admitting: *Deleted

## 2019-05-28 DIAGNOSIS — E089 Diabetes mellitus due to underlying condition without complications: Secondary | ICD-10-CM

## 2019-05-28 DIAGNOSIS — E785 Hyperlipidemia, unspecified: Secondary | ICD-10-CM

## 2019-05-29 DIAGNOSIS — F411 Generalized anxiety disorder: Secondary | ICD-10-CM | POA: Diagnosis not present

## 2019-05-29 DIAGNOSIS — F3162 Bipolar disorder, current episode mixed, moderate: Secondary | ICD-10-CM | POA: Diagnosis not present

## 2019-05-29 DIAGNOSIS — R69 Illness, unspecified: Secondary | ICD-10-CM | POA: Diagnosis not present

## 2019-05-30 DIAGNOSIS — R69 Illness, unspecified: Secondary | ICD-10-CM | POA: Diagnosis not present

## 2019-05-30 DIAGNOSIS — F3162 Bipolar disorder, current episode mixed, moderate: Secondary | ICD-10-CM | POA: Diagnosis not present

## 2019-05-30 DIAGNOSIS — F411 Generalized anxiety disorder: Secondary | ICD-10-CM | POA: Diagnosis not present

## 2019-05-31 MED ORDER — ATORVASTATIN CALCIUM 40 MG PO TABS: 40.0000 mg | ORAL_TABLET | Freq: Every day | ORAL | 3 refills | Status: DC

## 2019-06-01 ENCOUNTER — Encounter: Payer: Self-pay | Admitting: Family Medicine

## 2019-06-01 ENCOUNTER — Ambulatory Visit (INDEPENDENT_AMBULATORY_CARE_PROVIDER_SITE_OTHER): Payer: Medicare HMO | Admitting: Family Medicine

## 2019-06-01 ENCOUNTER — Other Ambulatory Visit: Payer: Self-pay

## 2019-06-01 VITALS — BP 120/60 | HR 86 | Ht 69.0 in | Wt 186.5 lb

## 2019-06-01 DIAGNOSIS — F411 Generalized anxiety disorder: Secondary | ICD-10-CM | POA: Diagnosis not present

## 2019-06-01 DIAGNOSIS — F32A Depression, unspecified: Secondary | ICD-10-CM

## 2019-06-01 DIAGNOSIS — E119 Type 2 diabetes mellitus without complications: Secondary | ICD-10-CM | POA: Insufficient documentation

## 2019-06-01 DIAGNOSIS — F3162 Bipolar disorder, current episode mixed, moderate: Secondary | ICD-10-CM | POA: Diagnosis not present

## 2019-06-01 DIAGNOSIS — R69 Illness, unspecified: Secondary | ICD-10-CM | POA: Diagnosis not present

## 2019-06-01 DIAGNOSIS — F419 Anxiety disorder, unspecified: Secondary | ICD-10-CM | POA: Diagnosis not present

## 2019-06-01 DIAGNOSIS — F329 Major depressive disorder, single episode, unspecified: Secondary | ICD-10-CM

## 2019-06-01 DIAGNOSIS — Z599 Problem related to housing and economic circumstances, unspecified: Secondary | ICD-10-CM | POA: Diagnosis not present

## 2019-06-01 NOTE — Assessment & Plan Note (Signed)
A1c 7.2 in February.  Remains well controlled.  Continue Metformin 1000 mg twice daily.  Patient congratulated on continuing to work towards exercise goals.  Plan to perform repeat A1c, lipid panel, BMP, diabetic foot exam at follow-up appointment in 3 months.  This will get patient on regular schedule of labs in June.

## 2019-06-01 NOTE — Assessment & Plan Note (Signed)
Improving.  On lexapro 10mg  and now getting counseling.  Overall, much improved.  PHQ-9 improved from 10 to 5.  Continues to deny SI.  Expressed to patient that I am very excited that she is doing so well.  Overall, she seems to be very happy and comfortable with her current state.  She seems to be over the acute worsening of her anxiety and depression secondary to her losses.  We will continue with current therapy and follow-up in 3 months or sooner as needed.  Patient was provided with suicide hotline number.

## 2019-06-01 NOTE — Patient Instructions (Addendum)
Thank you for coming to see me today. It was a pleasure. Today we talked about:   I'm so glad that you are feeling better!! Continue working with your therapist.    I have given you a paper with the housing information.  If you need further assistance, please let me know.  Keep up the great work with your exercise!  Please follow-up with me in 3 months or sooner as needed.  If you have any questions or concerns, please do not hesitate to call the office at [phone].  Best,   [place] Constable, DO

## 2019-06-01 NOTE — Progress Notes (Signed)
    SUBJECTIVE:   CHIEF COMPLAINT / HPI:   1 Anxiety/Depression Was seen 1 month ago for follow up of chest wall pain. Had negative cardiac work-up at [facility].  Thought to be related to stress and anxiety with recent death of 3 family members.  Patient was offered titration of lexapro, but declined at that time as she has been well-controlled on this prior to acute loss of family members.  1 week ago, she finally found a therapist and has been seeing her.  Reports that she is "helping me so much."  Within the last week, has not had any chest pain and overall feels much better.  PHQ-9 was 10 at last visit, today is 5.  Patient denies suicidal ideation.  Feels that overall she is doing much better and is very optimistic.  2 T2DM Currently on Metformin 1000 mg twice daily.  Last A1c in February 2021 was 7.2.  Recently got a smart watch and has been tracking her step count, feels that it is making her more proactive about exercising.  States that she is tolerating Metformin well.   3 Housing Situation Patient reports that she is facing possible eviction from her landlord because she is selling her house that she has been renting.  Reports that she is currently taking legal action.  States that she feels that she has a situation under control, but would like to know if she can have another paper with housing resources that she feels as though she lost the one that she was given previously.  PERTINENT  PMH / PSH: Anxiety/depression, schizoaffective disorder per patient report, hypothyroidism, T2DM  OBJECTIVE:   BP 120/60   Pulse 86   Ht 5\' 9"  (1.753 m)   Wt 186 lb 8 oz (84.6 kg)   SpO2 99%   BMI 27.54 kg/m    Physical Exam:  General: 59 y.o. female in NAD Lungs: Breathing comfortably on room air Skin: warm and dry Extremities: No edema Psych: Denies SI, mood and affect appropriate for circumstance    ASSESSMENT/PLAN:   Anxiety and depression Improving.  On lexapro 10mg  and now getting  counseling.  Overall, much improved.  PHQ-9 improved from 10 to 5.  Continues to deny SI.  Expressed to patient that I am very excited that she is doing so well.  Overall, she seems to be very happy and comfortable with her current state.  She seems to be over the acute worsening of her anxiety and depression secondary to her losses.  We will continue with current therapy and follow-up in 3 months or sooner as needed.  Patient was provided with suicide hotline number.  Diabetes mellitus without complication (HCC) 123456 7.2 in February.  Remains well controlled.  Continue Metformin 1000 mg twice daily.  Patient congratulated on continuing to work towards exercise goals.  Plan to perform repeat A1c, lipid panel, BMP, diabetic foot exam at follow-up appointment in 3 months.  This will get patient on regular schedule of labs in June.  Housing problems Patient currently taking legal action.  She was given a list of both legal and housing resources to refer to as needed.   HM: needs pap, diabetic eye exam, foot exam  Cleophas Dunker, [place]

## 2019-06-01 NOTE — Assessment & Plan Note (Signed)
Patient currently taking legal action.  She was given a list of both legal and housing resources to refer to as needed.

## 2019-06-03 DIAGNOSIS — F411 Generalized anxiety disorder: Secondary | ICD-10-CM | POA: Diagnosis not present

## 2019-06-03 DIAGNOSIS — F3162 Bipolar disorder, current episode mixed, moderate: Secondary | ICD-10-CM | POA: Diagnosis not present

## 2019-06-03 DIAGNOSIS — R69 Illness, unspecified: Secondary | ICD-10-CM | POA: Diagnosis not present

## 2019-06-05 DIAGNOSIS — F3162 Bipolar disorder, current episode mixed, moderate: Secondary | ICD-10-CM | POA: Diagnosis not present

## 2019-06-05 DIAGNOSIS — R69 Illness, unspecified: Secondary | ICD-10-CM | POA: Diagnosis not present

## 2019-06-05 DIAGNOSIS — F411 Generalized anxiety disorder: Secondary | ICD-10-CM | POA: Diagnosis not present

## 2019-06-07 DIAGNOSIS — F3162 Bipolar disorder, current episode mixed, moderate: Secondary | ICD-10-CM | POA: Diagnosis not present

## 2019-06-07 DIAGNOSIS — F411 Generalized anxiety disorder: Secondary | ICD-10-CM | POA: Diagnosis not present

## 2019-06-07 DIAGNOSIS — R69 Illness, unspecified: Secondary | ICD-10-CM | POA: Diagnosis not present

## 2019-06-08 DIAGNOSIS — F3162 Bipolar disorder, current episode mixed, moderate: Secondary | ICD-10-CM | POA: Diagnosis not present

## 2019-06-08 DIAGNOSIS — R69 Illness, unspecified: Secondary | ICD-10-CM | POA: Diagnosis not present

## 2019-06-08 DIAGNOSIS — F411 Generalized anxiety disorder: Secondary | ICD-10-CM | POA: Diagnosis not present

## 2019-06-09 DIAGNOSIS — R69 Illness, unspecified: Secondary | ICD-10-CM | POA: Diagnosis not present

## 2019-06-09 DIAGNOSIS — F411 Generalized anxiety disorder: Secondary | ICD-10-CM | POA: Diagnosis not present

## 2019-06-09 DIAGNOSIS — F3162 Bipolar disorder, current episode mixed, moderate: Secondary | ICD-10-CM | POA: Diagnosis not present

## 2019-06-15 DIAGNOSIS — F3162 Bipolar disorder, current episode mixed, moderate: Secondary | ICD-10-CM | POA: Diagnosis not present

## 2019-06-15 DIAGNOSIS — R69 Illness, unspecified: Secondary | ICD-10-CM | POA: Diagnosis not present

## 2019-06-15 DIAGNOSIS — F411 Generalized anxiety disorder: Secondary | ICD-10-CM | POA: Diagnosis not present

## 2019-06-17 DIAGNOSIS — R69 Illness, unspecified: Secondary | ICD-10-CM | POA: Diagnosis not present

## 2019-06-17 DIAGNOSIS — F3162 Bipolar disorder, current episode mixed, moderate: Secondary | ICD-10-CM | POA: Diagnosis not present

## 2019-06-17 DIAGNOSIS — F411 Generalized anxiety disorder: Secondary | ICD-10-CM | POA: Diagnosis not present

## 2019-06-22 DIAGNOSIS — F3162 Bipolar disorder, current episode mixed, moderate: Secondary | ICD-10-CM | POA: Diagnosis not present

## 2019-06-22 DIAGNOSIS — F411 Generalized anxiety disorder: Secondary | ICD-10-CM | POA: Diagnosis not present

## 2019-06-22 DIAGNOSIS — R69 Illness, unspecified: Secondary | ICD-10-CM | POA: Diagnosis not present

## 2019-06-24 DIAGNOSIS — F3162 Bipolar disorder, current episode mixed, moderate: Secondary | ICD-10-CM | POA: Diagnosis not present

## 2019-06-24 DIAGNOSIS — R69 Illness, unspecified: Secondary | ICD-10-CM | POA: Diagnosis not present

## 2019-06-24 DIAGNOSIS — F411 Generalized anxiety disorder: Secondary | ICD-10-CM | POA: Diagnosis not present

## 2019-06-26 DIAGNOSIS — F3162 Bipolar disorder, current episode mixed, moderate: Secondary | ICD-10-CM | POA: Diagnosis not present

## 2019-06-26 DIAGNOSIS — F411 Generalized anxiety disorder: Secondary | ICD-10-CM | POA: Diagnosis not present

## 2019-06-26 DIAGNOSIS — R69 Illness, unspecified: Secondary | ICD-10-CM | POA: Diagnosis not present

## 2019-06-30 DIAGNOSIS — F411 Generalized anxiety disorder: Secondary | ICD-10-CM | POA: Diagnosis not present

## 2019-06-30 DIAGNOSIS — R69 Illness, unspecified: Secondary | ICD-10-CM | POA: Diagnosis not present

## 2019-06-30 DIAGNOSIS — F3162 Bipolar disorder, current episode mixed, moderate: Secondary | ICD-10-CM | POA: Diagnosis not present

## 2019-07-06 DIAGNOSIS — F3162 Bipolar disorder, current episode mixed, moderate: Secondary | ICD-10-CM | POA: Diagnosis not present

## 2019-07-06 DIAGNOSIS — R69 Illness, unspecified: Secondary | ICD-10-CM | POA: Diagnosis not present

## 2019-07-06 DIAGNOSIS — F411 Generalized anxiety disorder: Secondary | ICD-10-CM | POA: Diagnosis not present

## 2019-07-08 DIAGNOSIS — R69 Illness, unspecified: Secondary | ICD-10-CM | POA: Diagnosis not present

## 2019-07-08 DIAGNOSIS — F411 Generalized anxiety disorder: Secondary | ICD-10-CM | POA: Diagnosis not present

## 2019-07-08 DIAGNOSIS — F3162 Bipolar disorder, current episode mixed, moderate: Secondary | ICD-10-CM | POA: Diagnosis not present

## 2019-07-11 DIAGNOSIS — R69 Illness, unspecified: Secondary | ICD-10-CM | POA: Diagnosis not present

## 2019-07-11 DIAGNOSIS — F3162 Bipolar disorder, current episode mixed, moderate: Secondary | ICD-10-CM | POA: Diagnosis not present

## 2019-07-11 DIAGNOSIS — F411 Generalized anxiety disorder: Secondary | ICD-10-CM | POA: Diagnosis not present

## 2019-07-13 DIAGNOSIS — R69 Illness, unspecified: Secondary | ICD-10-CM | POA: Diagnosis not present

## 2019-07-13 DIAGNOSIS — F411 Generalized anxiety disorder: Secondary | ICD-10-CM | POA: Diagnosis not present

## 2019-07-13 DIAGNOSIS — F3162 Bipolar disorder, current episode mixed, moderate: Secondary | ICD-10-CM | POA: Diagnosis not present

## 2019-07-20 DIAGNOSIS — R69 Illness, unspecified: Secondary | ICD-10-CM | POA: Diagnosis not present

## 2019-07-20 DIAGNOSIS — F411 Generalized anxiety disorder: Secondary | ICD-10-CM | POA: Diagnosis not present

## 2019-07-20 DIAGNOSIS — F3162 Bipolar disorder, current episode mixed, moderate: Secondary | ICD-10-CM | POA: Diagnosis not present

## 2019-07-22 DIAGNOSIS — F411 Generalized anxiety disorder: Secondary | ICD-10-CM | POA: Diagnosis not present

## 2019-07-22 DIAGNOSIS — F3162 Bipolar disorder, current episode mixed, moderate: Secondary | ICD-10-CM | POA: Diagnosis not present

## 2019-07-22 DIAGNOSIS — R69 Illness, unspecified: Secondary | ICD-10-CM | POA: Diagnosis not present

## 2019-07-23 DIAGNOSIS — F411 Generalized anxiety disorder: Secondary | ICD-10-CM | POA: Diagnosis not present

## 2019-07-23 DIAGNOSIS — F3162 Bipolar disorder, current episode mixed, moderate: Secondary | ICD-10-CM | POA: Diagnosis not present

## 2019-07-23 DIAGNOSIS — R69 Illness, unspecified: Secondary | ICD-10-CM | POA: Diagnosis not present

## 2019-07-25 DIAGNOSIS — F3162 Bipolar disorder, current episode mixed, moderate: Secondary | ICD-10-CM | POA: Diagnosis not present

## 2019-07-25 DIAGNOSIS — F411 Generalized anxiety disorder: Secondary | ICD-10-CM | POA: Diagnosis not present

## 2019-07-25 DIAGNOSIS — R69 Illness, unspecified: Secondary | ICD-10-CM | POA: Diagnosis not present

## 2019-07-27 DIAGNOSIS — R69 Illness, unspecified: Secondary | ICD-10-CM | POA: Diagnosis not present

## 2019-07-27 DIAGNOSIS — F411 Generalized anxiety disorder: Secondary | ICD-10-CM | POA: Diagnosis not present

## 2019-07-27 DIAGNOSIS — F3162 Bipolar disorder, current episode mixed, moderate: Secondary | ICD-10-CM | POA: Diagnosis not present

## 2019-07-29 DIAGNOSIS — F411 Generalized anxiety disorder: Secondary | ICD-10-CM | POA: Diagnosis not present

## 2019-07-29 DIAGNOSIS — F3162 Bipolar disorder, current episode mixed, moderate: Secondary | ICD-10-CM | POA: Diagnosis not present

## 2019-07-29 DIAGNOSIS — R69 Illness, unspecified: Secondary | ICD-10-CM | POA: Diagnosis not present

## 2019-07-30 ENCOUNTER — Other Ambulatory Visit: Payer: Self-pay

## 2019-07-30 DIAGNOSIS — F32A Depression, unspecified: Secondary | ICD-10-CM

## 2019-07-30 DIAGNOSIS — R69 Illness, unspecified: Secondary | ICD-10-CM | POA: Diagnosis not present

## 2019-07-30 DIAGNOSIS — F3162 Bipolar disorder, current episode mixed, moderate: Secondary | ICD-10-CM | POA: Diagnosis not present

## 2019-07-30 DIAGNOSIS — F329 Major depressive disorder, single episode, unspecified: Secondary | ICD-10-CM

## 2019-07-30 DIAGNOSIS — F411 Generalized anxiety disorder: Secondary | ICD-10-CM | POA: Diagnosis not present

## 2019-07-31 DIAGNOSIS — R69 Illness, unspecified: Secondary | ICD-10-CM | POA: Diagnosis not present

## 2019-07-31 MED ORDER — ESCITALOPRAM OXALATE 10 MG PO TABS: 10.0000 mg | ORAL_TABLET | Freq: Every day | ORAL | 3 refills | Status: DC

## 2019-08-04 ENCOUNTER — Other Ambulatory Visit: Payer: Self-pay

## 2019-08-04 ENCOUNTER — Encounter: Payer: Self-pay | Admitting: Family Medicine

## 2019-08-04 ENCOUNTER — Ambulatory Visit (INDEPENDENT_AMBULATORY_CARE_PROVIDER_SITE_OTHER): Payer: Medicare HMO | Admitting: Family Medicine

## 2019-08-04 VITALS — BP 123/80 | HR 71 | Wt 181.0 lb

## 2019-08-04 DIAGNOSIS — E119 Type 2 diabetes mellitus without complications: Secondary | ICD-10-CM

## 2019-08-04 LAB — POCT GLYCOSYLATED HEMOGLOBIN (HGB A1C): HbA1c, POC (controlled diabetic range): 6.6 % (ref 0.0–7.0)

## 2019-08-04 NOTE — Assessment & Plan Note (Signed)
Patient's diabetes is very well controlled.  A1c 6.6 today.  Congratulated patient on this.  Currently on Metformin 1000 mg twice daily, will continue this.  Foot exam performed today without abnormality.  Patient advised to follow-up with her eye doctor regarding an eye exam.  Plans to return in 1 month for full set of labs so that it will be 1 year since her last lipid panel, will also perform TSH at that time.

## 2019-08-04 NOTE — Progress Notes (Signed)
    SUBJECTIVE:   CHIEF COMPLAINT / HPI:   DM F/U Last A1c 7.2 Currently on Metformin 1000 mg twice daily Has been exercising more with her new watch A1c today is 6.6 Denies any polyuria, polydipsia, hypoglycemia   PERTINENT  PMH / PSH: Anxiety/depression, hypothyroidism, T2DM  OBJECTIVE:   BP 123/80   Pulse 71   Wt 181 lb (82.1 kg)   SpO2 98%   BMI 26.73 kg/m    Physical Exam:  General: 59 y.o. female in NAD Lungs: Breathing comfortably on room air Abdomen: Soft, non-tender to palpation, non-distended, positive bowel sounds Skin: warm and dry Extremities: No edema  Diabetic Foot Check -  Appearance - no lesions, ulcers.  Has bilateral callus on 1st MTP b/l Skin - no unusual pallor or redness Monofilament testing - normal bilaterally  Right - Great toe, medial, central, lateral ball and posterior foot intact Left - Great toe, medial, central, lateral ball and posterior foot intact    ASSESSMENT/PLAN:   Diabetes mellitus without complication ([place]) Patient's diabetes is very well controlled.  A1c 6.6 today.  Congratulated patient on this.  Currently on Metformin 1000 mg twice daily, will continue this.  Foot exam performed today without abnormality.  Patient advised to follow-up with her eye doctor regarding an eye exam.  Plans to return in 1 month for full set of labs so that it will be 1 year since her last lipid panel, will also perform TSH at that time.   Patient advised to schedule a Pap smear, she would like to do it at the gynecology office.  Cleophas Dunker, [place]

## 2019-08-04 NOTE — Patient Instructions (Addendum)
Thank you for coming to see me today. It was a pleasure. Today we talked about:   Schedule a pap smear.  See the eye doctor.  Your A1c was 6.6!!!!  Please follow-up with me in 1 month for your labs (after 6/12).  If you have any questions or concerns, please do not hesitate to call the office at [phone].  Best,   [place] Constable, DO

## 2019-08-17 DIAGNOSIS — R69 Illness, unspecified: Secondary | ICD-10-CM | POA: Diagnosis not present

## 2019-08-19 DIAGNOSIS — R69 Illness, unspecified: Secondary | ICD-10-CM | POA: Diagnosis not present

## 2019-08-24 DIAGNOSIS — R69 Illness, unspecified: Secondary | ICD-10-CM | POA: Diagnosis not present

## 2019-08-26 DIAGNOSIS — R69 Illness, unspecified: Secondary | ICD-10-CM | POA: Diagnosis not present

## 2019-08-31 ENCOUNTER — Encounter: Payer: Self-pay | Admitting: Family Medicine

## 2019-08-31 DIAGNOSIS — Z1231 Encounter for screening mammogram for malignant neoplasm of breast: Secondary | ICD-10-CM | POA: Diagnosis not present

## 2019-09-01 DIAGNOSIS — R69 Illness, unspecified: Secondary | ICD-10-CM | POA: Diagnosis not present

## 2019-09-02 DIAGNOSIS — H401131 Primary open-angle glaucoma, bilateral, mild stage: Secondary | ICD-10-CM | POA: Diagnosis not present

## 2019-09-03 DIAGNOSIS — R69 Illness, unspecified: Secondary | ICD-10-CM | POA: Diagnosis not present

## 2019-09-07 DIAGNOSIS — R69 Illness, unspecified: Secondary | ICD-10-CM | POA: Diagnosis not present

## 2019-09-09 DIAGNOSIS — I10 Essential (primary) hypertension: Secondary | ICD-10-CM | POA: Diagnosis not present

## 2019-09-09 DIAGNOSIS — R0789 Other chest pain: Secondary | ICD-10-CM | POA: Diagnosis not present

## 2019-09-09 DIAGNOSIS — R69 Illness, unspecified: Secondary | ICD-10-CM | POA: Diagnosis not present

## 2019-09-09 DIAGNOSIS — R079 Chest pain, unspecified: Secondary | ICD-10-CM | POA: Diagnosis not present

## 2019-09-11 DIAGNOSIS — R69 Illness, unspecified: Secondary | ICD-10-CM | POA: Diagnosis not present

## 2019-09-13 DIAGNOSIS — F3162 Bipolar disorder, current episode mixed, moderate: Secondary | ICD-10-CM | POA: Diagnosis not present

## 2019-09-13 DIAGNOSIS — R69 Illness, unspecified: Secondary | ICD-10-CM | POA: Diagnosis not present

## 2019-09-13 DIAGNOSIS — F411 Generalized anxiety disorder: Secondary | ICD-10-CM | POA: Diagnosis not present

## 2019-09-14 ENCOUNTER — Other Ambulatory Visit: Payer: Self-pay

## 2019-09-14 ENCOUNTER — Ambulatory Visit (INDEPENDENT_AMBULATORY_CARE_PROVIDER_SITE_OTHER): Payer: Medicare HMO | Admitting: Family Medicine

## 2019-09-14 VITALS — BP 126/80 | HR 74 | Ht 68.5 in | Wt 179.6 lb

## 2019-09-14 DIAGNOSIS — E785 Hyperlipidemia, unspecified: Secondary | ICD-10-CM | POA: Diagnosis not present

## 2019-09-14 DIAGNOSIS — E119 Type 2 diabetes mellitus without complications: Secondary | ICD-10-CM

## 2019-09-14 DIAGNOSIS — M940 Chondrocostal junction syndrome [Tietze]: Secondary | ICD-10-CM | POA: Diagnosis not present

## 2019-09-14 DIAGNOSIS — R030 Elevated blood-pressure reading, without diagnosis of hypertension: Secondary | ICD-10-CM | POA: Diagnosis not present

## 2019-09-14 DIAGNOSIS — E039 Hypothyroidism, unspecified: Secondary | ICD-10-CM | POA: Diagnosis not present

## 2019-09-14 NOTE — Patient Instructions (Signed)
Thank you for coming to see me today. It was a pleasure. Today we talked about:   Have to the eye doctor send [place] your records.  Remember to schedule your pap smear.  We will get labs today.  If something needs changed, I'll call you, otherwise I will message you on mychart.  Please follow-up with me in 3 months so we can check on your blood pressure.  If you have any questions or concerns, please do not hesitate to call the office at [phone].  Best,   Ari[place] Constable, DO

## 2019-09-14 NOTE — Assessment & Plan Note (Signed)
Last TSH within normal limits in 08/2018.  Will check TSH today.  Continue Synthroid 25 mcg daily.

## 2019-09-14 NOTE — Assessment & Plan Note (Signed)
Last lipid panel June 2020.  Currently on atorvastatin 40 mg daily.  We will continue this.  Will order lipid panel today.  LDL goal less than 100.

## 2019-09-14 NOTE — Assessment & Plan Note (Signed)
It appears that patient had an EKG and troponins at outside ED.  Unable to obtain records on Care Everywhere.  Results of these tests were not reported on AVS.  Do see where patient was diagnosed with costochondritis, she was also told that her heart was fine.  She has very atypical chest pain that is on the right side, reassured that it does not occur with exertion, does not improve with rest.  Given this and her tenderness to palpation on the right side of her chest today, suspect that it truly is costochondritis.  Discussed this diagnosis with patient.  Advised that should she have chest pain again, she should come back, so that she can be examined here.  She voiced understanding.

## 2019-09-14 NOTE — Assessment & Plan Note (Signed)
Patient reports an elevated blood pressure reading at ED, do not see where this was recorded on the AVS.  She does not remember the number.  She has had well-controlled blood pressures while she has been here.  Discussed ACE/ARB therapy with her given her diabetes, she declines at this time.  We will have her come back in 3 months to follow-up on her blood pressure and ensure that it is still well controlled.

## 2019-09-14 NOTE — Progress Notes (Signed)
SUBJECTIVE:   CHIEF COMPLAINT / HPI:   Recent Chest Pain On 6/10, while on vacation in [place], started to have chest pain Went to ED Was told that her BP was elevated, but does not know the number She states the pain was on the right side of her chest Pain was worse with movement and deep breathing Not resolved with rest, not associated with exertion Pain continued until yesterday, now she is not in pain No shortness of breath, nausea, diaphoresis Was told that everything with her heart was fine and that it was costochondritis She was given two injections, one was toradol, doesn't remember the other Brought AVS with her, no vitals or results on it, but they did perform troponin x2 Also prescribed ibuprofen and prednisone but hasn't filled them  Elevated BP She was told her BP was elevated at the ED on 6/10 when she presented for chest pain Does not know what the number was, states that she was in a lot of pain at the time and she was very nervous because she was having chest pain, thinks that that is the reason it was elevated States that her blood pressure has never been elevated, she is in pain  T2DM Currently on Metformin 1000 mg twice daily Doesn't check CBGs Last A1c 6.6 on 08/04/19 Reports compliance with her medication Denies polyuria, polydipsia, hypoglycemia Goes to eye doctor on 6/30  Hyperlipidemia Currently on Lipitor 40 mg daily Last lipid panel 09/11/2018 Denies shortness of breath, no chest pain aside from above States that she is tolerating her statin well  Hypothyroidism Last TSH 09/11/2018, 2.86 Currently on Synthroid 25 mcg daily Reports compliance   PERTINENT  PMH / PSH: T2DM, hypothyroidism, hyperlipidemia, schizoaffective disorder  OBJECTIVE:   BP 126/80   Pulse 74   Ht 5' 8.5" (1.74 m)   Wt 179 lb 9.6 oz (81.5 kg)   SpO2 99%   BMI 26.91 kg/m    Physical Exam:  General: 59 y.o. female in NAD HEENT: NCAT Neck: no thyromegaly Cardio:  RRR no m/r/g, mild TTP right chest wall at costochondral junction Lungs: CTAB, no wheezing, no rhonchi, no crackles, no IWOB on RA Skin: warm and dry Extremities: No edema   ASSESSMENT/PLAN:   Diabetes mellitus without complication (HCC) Last S0F 6.6.  Continue with Metformin 1000 mg twice daily.  Will check BMP and lipid panel today.  Discussed importance of ACE/ARB therapy with patient, she declines at this time.  She is currently on a statin.  Reports that she will be getting her eye exam on 6/30, advised her to have the results faxed here.  Hypothyroidism Last TSH within normal limits in 08/2018.  Will check TSH today.  Continue Synthroid 25 mcg daily.  HLD (hyperlipidemia) Last lipid panel June 2020.  Currently on atorvastatin 40 mg daily.  We will continue this.  Will order lipid panel today.  LDL goal less than 100.  Elevated blood pressure reading Patient reports an elevated blood pressure reading at ED, do not see where this was recorded on the AVS.  She does not remember the number.  She has had well-controlled blood pressures while she has been here.  Discussed ACE/ARB therapy with her given her diabetes, she declines at this time.  We will have her come back in 3 months to follow-up on her blood pressure and ensure that it is still well controlled.  Costochondritis It appears that patient had an EKG and troponins at outside ED.  Unable  to obtain records on Care Everywhere.  Results of these tests were not reported on AVS.  Do see where patient was diagnosed with costochondritis, she was also told that her heart was fine.  She has very atypical chest pain that is on the right side, reassured that it does not occur with exertion, does not improve with rest.  Given this and her tenderness to palpation on the right side of her chest today, suspect that it truly is costochondritis.  Discussed this diagnosis with patient.  Advised that should she have chest pain again, she should come back,  so that she can be examined here.  She voiced understanding.   Advised to schedule for a Pap smear she would like to have this at the gynecology office.  Cleophas Dunker, [place]

## 2019-09-14 NOTE — Assessment & Plan Note (Signed)
Last A1c 6.6.  Continue with Metformin 1000 mg twice daily.  Will check BMP and lipid panel today.  Discussed importance of ACE/ARB therapy with patient, she declines at this time.  She is currently on a statin.  Reports that she will be getting her eye exam on 6/30, advised her to have the results faxed here.

## 2019-09-15 DIAGNOSIS — F411 Generalized anxiety disorder: Secondary | ICD-10-CM | POA: Diagnosis not present

## 2019-09-15 DIAGNOSIS — F3162 Bipolar disorder, current episode mixed, moderate: Secondary | ICD-10-CM | POA: Diagnosis not present

## 2019-09-15 DIAGNOSIS — R69 Illness, unspecified: Secondary | ICD-10-CM | POA: Diagnosis not present

## 2019-09-15 LAB — LIPID PANEL
Chol/HDL Ratio: 2.9 ratio (ref 0.0–4.4)
Cholesterol, Total: 134 mg/dL (ref 100–199)
HDL: 46 mg/dL (ref >39)
LDL Chol Calc (NIH): 71 mg/dL (ref 0–99)
Triglycerides: 89 mg/dL (ref 0–149)
VLDL Cholesterol Cal: 17 mg/dL (ref 5–40)

## 2019-09-15 LAB — BASIC METABOLIC PANEL
BUN/Creatinine Ratio: 12 (ref 12–28)
BUN: 10 mg/dL (ref 8–27)
CO2: 19 mmol/L — ABNORMAL LOW (ref 20–29)
Calcium: 9.3 mg/dL (ref 8.7–10.3)
Chloride: 104 mmol/L (ref 96–106)
Creatinine, Ser: 0.84 mg/dL (ref 0.57–1.00)
GFR calc Af Amer: 87 mL/min/{1.73_m2} (ref >59)
GFR calc non Af Amer: 76 mL/min/{1.73_m2} (ref >59)
Glucose: 167 mg/dL — ABNORMAL HIGH (ref 65–99)
Potassium: 4.1 mmol/L (ref 3.5–5.2)
Sodium: 138 mmol/L (ref 134–144)

## 2019-09-15 LAB — TSH: TSH: 3.22 u[IU]/mL (ref 0.450–4.500)

## 2019-09-17 DIAGNOSIS — R69 Illness, unspecified: Secondary | ICD-10-CM | POA: Diagnosis not present

## 2019-09-17 DIAGNOSIS — F411 Generalized anxiety disorder: Secondary | ICD-10-CM | POA: Diagnosis not present

## 2019-09-17 DIAGNOSIS — F3162 Bipolar disorder, current episode mixed, moderate: Secondary | ICD-10-CM | POA: Diagnosis not present

## 2019-09-19 DIAGNOSIS — R69 Illness, unspecified: Secondary | ICD-10-CM | POA: Diagnosis not present

## 2019-09-19 DIAGNOSIS — F3162 Bipolar disorder, current episode mixed, moderate: Secondary | ICD-10-CM | POA: Diagnosis not present

## 2019-09-19 DIAGNOSIS — F411 Generalized anxiety disorder: Secondary | ICD-10-CM | POA: Diagnosis not present

## 2019-09-22 DIAGNOSIS — R69 Illness, unspecified: Secondary | ICD-10-CM | POA: Diagnosis not present

## 2019-09-22 DIAGNOSIS — F3162 Bipolar disorder, current episode mixed, moderate: Secondary | ICD-10-CM | POA: Diagnosis not present

## 2019-09-22 DIAGNOSIS — F411 Generalized anxiety disorder: Secondary | ICD-10-CM | POA: Diagnosis not present

## 2019-09-24 DIAGNOSIS — F411 Generalized anxiety disorder: Secondary | ICD-10-CM | POA: Diagnosis not present

## 2019-09-24 DIAGNOSIS — F3162 Bipolar disorder, current episode mixed, moderate: Secondary | ICD-10-CM | POA: Diagnosis not present

## 2019-09-24 DIAGNOSIS — R69 Illness, unspecified: Secondary | ICD-10-CM | POA: Diagnosis not present

## 2019-09-27 DIAGNOSIS — R69 Illness, unspecified: Secondary | ICD-10-CM | POA: Diagnosis not present

## 2019-09-27 DIAGNOSIS — F3162 Bipolar disorder, current episode mixed, moderate: Secondary | ICD-10-CM | POA: Diagnosis not present

## 2019-09-27 DIAGNOSIS — F411 Generalized anxiety disorder: Secondary | ICD-10-CM | POA: Diagnosis not present

## 2019-09-28 DIAGNOSIS — F3162 Bipolar disorder, current episode mixed, moderate: Secondary | ICD-10-CM | POA: Diagnosis not present

## 2019-09-28 DIAGNOSIS — F411 Generalized anxiety disorder: Secondary | ICD-10-CM | POA: Diagnosis not present

## 2019-09-28 DIAGNOSIS — R69 Illness, unspecified: Secondary | ICD-10-CM | POA: Diagnosis not present

## 2019-09-29 DIAGNOSIS — R69 Illness, unspecified: Secondary | ICD-10-CM | POA: Diagnosis not present

## 2019-09-29 DIAGNOSIS — F3162 Bipolar disorder, current episode mixed, moderate: Secondary | ICD-10-CM | POA: Diagnosis not present

## 2019-09-29 DIAGNOSIS — E119 Type 2 diabetes mellitus without complications: Secondary | ICD-10-CM | POA: Diagnosis not present

## 2019-09-29 DIAGNOSIS — F411 Generalized anxiety disorder: Secondary | ICD-10-CM | POA: Diagnosis not present

## 2019-09-29 DIAGNOSIS — H401131 Primary open-angle glaucoma, bilateral, mild stage: Secondary | ICD-10-CM | POA: Diagnosis not present

## 2019-09-29 DIAGNOSIS — H25813 Combined forms of age-related cataract, bilateral: Secondary | ICD-10-CM | POA: Diagnosis not present

## 2019-09-30 DIAGNOSIS — F411 Generalized anxiety disorder: Secondary | ICD-10-CM | POA: Diagnosis not present

## 2019-09-30 DIAGNOSIS — F3162 Bipolar disorder, current episode mixed, moderate: Secondary | ICD-10-CM | POA: Diagnosis not present

## 2019-09-30 DIAGNOSIS — R69 Illness, unspecified: Secondary | ICD-10-CM | POA: Diagnosis not present

## 2019-10-05 DIAGNOSIS — H5213 Myopia, bilateral: Secondary | ICD-10-CM | POA: Diagnosis not present

## 2019-10-06 DIAGNOSIS — R69 Illness, unspecified: Secondary | ICD-10-CM | POA: Diagnosis not present

## 2019-10-06 DIAGNOSIS — F411 Generalized anxiety disorder: Secondary | ICD-10-CM | POA: Diagnosis not present

## 2019-10-06 DIAGNOSIS — F3162 Bipolar disorder, current episode mixed, moderate: Secondary | ICD-10-CM | POA: Diagnosis not present

## 2019-10-08 DIAGNOSIS — F3162 Bipolar disorder, current episode mixed, moderate: Secondary | ICD-10-CM | POA: Diagnosis not present

## 2019-10-08 DIAGNOSIS — F411 Generalized anxiety disorder: Secondary | ICD-10-CM | POA: Diagnosis not present

## 2019-10-08 DIAGNOSIS — R69 Illness, unspecified: Secondary | ICD-10-CM | POA: Diagnosis not present

## 2019-10-08 DIAGNOSIS — Z0100 Encounter for examination of eyes and vision without abnormal findings: Secondary | ICD-10-CM | POA: Diagnosis not present

## 2019-10-11 DIAGNOSIS — R69 Illness, unspecified: Secondary | ICD-10-CM | POA: Diagnosis not present

## 2019-10-11 DIAGNOSIS — F3162 Bipolar disorder, current episode mixed, moderate: Secondary | ICD-10-CM | POA: Diagnosis not present

## 2019-10-11 DIAGNOSIS — F411 Generalized anxiety disorder: Secondary | ICD-10-CM | POA: Diagnosis not present

## 2019-10-13 DIAGNOSIS — F411 Generalized anxiety disorder: Secondary | ICD-10-CM | POA: Diagnosis not present

## 2019-10-13 DIAGNOSIS — F3162 Bipolar disorder, current episode mixed, moderate: Secondary | ICD-10-CM | POA: Diagnosis not present

## 2019-10-13 DIAGNOSIS — R69 Illness, unspecified: Secondary | ICD-10-CM | POA: Diagnosis not present

## 2019-10-16 DIAGNOSIS — F3162 Bipolar disorder, current episode mixed, moderate: Secondary | ICD-10-CM | POA: Diagnosis not present

## 2019-10-16 DIAGNOSIS — F411 Generalized anxiety disorder: Secondary | ICD-10-CM | POA: Diagnosis not present

## 2019-10-16 DIAGNOSIS — R69 Illness, unspecified: Secondary | ICD-10-CM | POA: Diagnosis not present

## 2019-11-23 DIAGNOSIS — H524 Presbyopia: Secondary | ICD-10-CM | POA: Diagnosis not present

## 2019-12-02 ENCOUNTER — Other Ambulatory Visit: Payer: Self-pay

## 2019-12-02 MED ORDER — LEVOTHYROXINE SODIUM 25 MCG PO TABS: ORAL_TABLET | ORAL | 3 refills | Status: DC

## 2019-12-14 DIAGNOSIS — Z6828 Body mass index (BMI) 28.0-28.9, adult: Secondary | ICD-10-CM | POA: Diagnosis not present

## 2019-12-14 DIAGNOSIS — Z01419 Encounter for gynecological examination (general) (routine) without abnormal findings: Secondary | ICD-10-CM | POA: Diagnosis not present

## 2020-01-18 DIAGNOSIS — Z20828 Contact with and (suspected) exposure to other viral communicable diseases: Secondary | ICD-10-CM | POA: Diagnosis not present

## 2020-03-07 ENCOUNTER — Encounter: Payer: Self-pay | Admitting: Family Medicine

## 2020-03-07 ENCOUNTER — Other Ambulatory Visit: Payer: Self-pay

## 2020-03-07 ENCOUNTER — Ambulatory Visit (INDEPENDENT_AMBULATORY_CARE_PROVIDER_SITE_OTHER): Payer: Medicare HMO | Admitting: Family Medicine

## 2020-03-07 VITALS — BP 132/70 | HR 82 | Ht 68.5 in | Wt 183.8 lb

## 2020-03-07 DIAGNOSIS — M545 Low back pain, unspecified: Secondary | ICD-10-CM | POA: Diagnosis not present

## 2020-03-07 DIAGNOSIS — T63481A Toxic effect of venom of other arthropod, accidental (unintentional), initial encounter: Secondary | ICD-10-CM | POA: Diagnosis not present

## 2020-03-07 MED ORDER — KETOROLAC TROMETHAMINE 30 MG/ML IJ SOLN
30.0000 mg | Freq: Once | INTRAMUSCULAR | Status: AC
  Administered 2020-03-07: 30 mg via INTRAMUSCULAR

## 2020-03-07 MED ORDER — DIPHENHYDRAMINE-ZINC ACETATE 2-0.1 % EX CREA
1.0000 | TOPICAL_CREAM | Freq: Three times a day (TID) | CUTANEOUS | 0 refills | Status: DC | PRN
Start: 2020-03-07 — End: 2020-09-01

## 2020-03-07 MED ORDER — FAMOTIDINE 20 MG PO TABS: 20.0000 mg | ORAL_TABLET | Freq: Two times a day (BID) | ORAL | 0 refills | Status: DC

## 2020-03-07 NOTE — Patient Instructions (Signed)
Thank you for coming to see me today. It was a pleasure.   Take Pepcid twice a day Use Benadryl cream as needed  If you have any shortness of breath or difficulty swallowing  Please go to the emergency department as soon as possible  Please follow-up with PCP as neede  If you have any questions or concerns, please do not hesitate to call the office at [phone].  Best,   Carollee Leitz, MD Family Medicine Residency

## 2020-03-07 NOTE — Progress Notes (Signed)
    SUBJECTIVE:   CHIEF COMPLAINT / HPI: bee sting  Patient reports was outside near cleaning was stung by a bee about an hour ago.  Put some type of cream on it which did not help relieve the pain.  She rates the pain as an 8.5 out of 10.  This has been the fourth time that she has been stung by a bee in her lifetime.  Unsure what type of bee she was stung by.  She denies any chest pain, shortness of breath, any difficulty breathing, tongue swelling, or difficulty speaking.  Has never had an anaphylactic reaction to bee stings before.  Reports she was stung on her abdomen.  She was stung a second time on her lower mons area and decided to come to the clinic to be evaluated.  PERTINENT  PMH / PSH:  none  OBJECTIVE:   BP 132/70   Pulse 82   Ht 5' 8.5" (1.74 m)   Wt 183 lb 12.8 oz (83.4 kg)   SpO2 97%   BMI 27.54 kg/m    General: Alert and oriented, no apparent distress  Cardiovascular: RRR with no murmurs noted Respiratory: CTA bilaterally  Derm: Small erythematous area noted to left lower abdomen, no edema noted.  Also noted a small erythematous area to left mons with some significant swelling.  Femoral pulses present bilaterally.  See media for pictures.    ASSESSMENT/PLAN:   Hymenoptera reaction Recent bee sting.  No anaphylactic reaction.  Some mild edema noted on exam. Pepcid 20 mg twice daily times 14/7 Benadryl cream 3 times daily as needed Strict return precautions provided, specifically any shortness of breath or difficulty breathing, thickness of tongue or difficulty with speech. Follow-up with PCP as needed.  Back pain During interview while getting up from the exam table patient had muscle spasm and acute back pain.  Unable to perform good assessment.  Very tender to touch.  Of note patient has history of scoliosis and last x-rays 3 years ago Toradol 30 mg IM for acute back pain today. Ambulatory referral to PT Follow-up with PCP for further evaluation.      Carollee Leitz, MD [place]

## 2020-03-11 ENCOUNTER — Encounter: Payer: Self-pay | Admitting: Family Medicine

## 2020-03-11 DIAGNOSIS — T63481A Toxic effect of venom of other arthropod, accidental (unintentional), initial encounter: Secondary | ICD-10-CM | POA: Insufficient documentation

## 2020-03-11 NOTE — Assessment & Plan Note (Signed)
Recent bee sting.  No anaphylactic reaction.  Some mild edema noted on exam. Pepcid 20 mg twice daily times 14/7 Benadryl cream 3 times daily as needed Strict return precautions provided, specifically any shortness of breath or difficulty breathing, thickness of tongue or difficulty with speech. Follow-up with PCP as needed.

## 2020-03-11 NOTE — Assessment & Plan Note (Signed)
During interview while getting up from the exam table patient had muscle spasm and acute back pain.  Unable to perform good assessment.  Very tender to touch.  Of note patient has history of scoliosis and last x-rays 3 years ago Toradol 30 mg IM for acute back pain today. Ambulatory referral to PT Follow-up with PCP for further evaluation.

## 2020-03-16 ENCOUNTER — Other Ambulatory Visit: Payer: Self-pay | Admitting: Family Medicine

## 2020-03-16 DIAGNOSIS — E089 Diabetes mellitus due to underlying condition without complications: Secondary | ICD-10-CM

## 2020-04-04 ENCOUNTER — Ambulatory Visit: Payer: Medicare Other | Attending: Family Medicine

## 2020-04-04 DIAGNOSIS — M62830 Muscle spasm of back: Secondary | ICD-10-CM | POA: Insufficient documentation

## 2020-04-04 DIAGNOSIS — G8929 Other chronic pain: Secondary | ICD-10-CM | POA: Insufficient documentation

## 2020-04-04 DIAGNOSIS — M546 Pain in thoracic spine: Secondary | ICD-10-CM | POA: Insufficient documentation

## 2020-04-04 DIAGNOSIS — M6281 Muscle weakness (generalized): Secondary | ICD-10-CM | POA: Insufficient documentation

## 2020-04-04 DIAGNOSIS — M545 Low back pain, unspecified: Secondary | ICD-10-CM | POA: Insufficient documentation

## 2020-04-04 DIAGNOSIS — R262 Difficulty in walking, not elsewhere classified: Secondary | ICD-10-CM | POA: Insufficient documentation

## 2020-04-05 ENCOUNTER — Other Ambulatory Visit: Payer: Self-pay

## 2020-04-05 ENCOUNTER — Ambulatory Visit (INDEPENDENT_AMBULATORY_CARE_PROVIDER_SITE_OTHER): Payer: Medicare Other | Admitting: Family Medicine

## 2020-04-05 ENCOUNTER — Encounter: Payer: Self-pay | Admitting: Family Medicine

## 2020-04-05 VITALS — BP 108/60 | HR 74 | Wt 180.0 lb

## 2020-04-05 DIAGNOSIS — M4120 Other idiopathic scoliosis, site unspecified: Secondary | ICD-10-CM | POA: Diagnosis not present

## 2020-04-05 DIAGNOSIS — E119 Type 2 diabetes mellitus without complications: Secondary | ICD-10-CM

## 2020-04-05 LAB — POCT GLYCOSYLATED HEMOGLOBIN (HGB A1C): Hemoglobin A1C: 6.8 % — AB (ref 4.0–5.6)

## 2020-04-05 NOTE — Patient Instructions (Addendum)
Thank you for coming to see me today. It was a pleasure. Today we talked about:   Go to PT as this can help with your back pain.  Your A1c was 6.8.  We will continue your current medications.  Please follow-up with me in 3-6 months or sooner as needed.  I will be finishing up my training here at the end of June, so I would like to see you back before then!  If you have any questions or concerns, please do not hesitate to call the office at [phone].  Best,   Luis Abed, DO

## 2020-04-05 NOTE — Assessment & Plan Note (Signed)
Chronic issue.  She states that she was going to have surgery when she was younger, but was advised that she was too old and that she would have pain for the rest of her life.  She is not had any change in his pain.  Just occasionally gets spasms and flareups, but is doing well currently.  Did discuss physical therapy and advised her that this would likely be helpful in helping her live with the pain and work on stretches and strengthening exercises.  She was agreeable to this will follow up with PT.

## 2020-04-05 NOTE — Assessment & Plan Note (Signed)
A1c today is still well controlled, goal less than 7.  A1c 6.8 today.  Continue with Metformin 1000 mg twice daily.  She is up-to-date on lab work, would repeat in June when she is due for a lipid panel and could also obtain BMP at that time.  She is up-to-date on her eye exam, asked that she have a doctor fax the results to [place].

## 2020-04-05 NOTE — Progress Notes (Signed)
    SUBJECTIVE:   CHIEF COMPLAINT / HPI:   T2DM Current regimen: Metformin 1000 mg twice daily Doesn't check CBG  Last A1c 6.6 on 08/04/2019 Eye exam completed somewhere between Sept and Nov, she was told everything was fine Lipitor 40 mg daily Last lipid panel 09/14/2019, LDL 71, last BMP at that time as well with creatinine stable at 0.84 She is doing intermittent fasting She is now riding a bike outside  Chronic Back Pain  Has a history of scoliosis States that every now and then it has flared She states that this has been going on q2-3 months for her entire life She is having manageable pain now She was scheduled for PT yesterday, but wanted to see PCP first Using a heating pad She will take aleve and tylenol as well No change in bowel or bladder habits, saddle anesthesia, weakness in legs  Has already had her COVID booster Declines flu vaccination  Pap smear performed at gynecologist in the last 3 months, was normal   PERTINENT  PMH / PSH: T2DM, hypothyroidism, hyperlipidemia, schizoaffective disorder  OBJECTIVE:   BP 108/60   Pulse 74   Wt 180 lb (81.6 kg)   SpO2 98%   BMI 26.97 kg/m    Physical Exam:  General: 60 y.o. female in NAD Lungs: Breathing comfortably on room air Skin: warm and dry Extremities: Ambulating without difficulty  Lumbar spine: - Inspection: obvious lumbar scoliosis toward left - Palpation: No TTP over the spinous processes, paraspinal muscles, or SI joints b/l - ROM: full active ROM of the lumbar spine in flexion and extension, some chronic pain with extensions - Strength: 5/5 strength of lower extremity in L4-S1 nerve root distributions b/l; normal gait - Neuro: sensation intact in the L4-S1 nerve root distribution b/l, 2+ patellar reflexes - Special testing: Negative straight leg raise   Results for orders placed or performed in visit on 04/05/20 (from the past 24 hour(s))  HgB A1c     Status: Abnormal   Collection Time: 04/05/20   3:17 PM  Result Value Ref Range   Hemoglobin A1C 6.8 (A) 4.0 - 5.6 %   HbA1c POC (<> result, manual entry)     HbA1c, POC (prediabetic range)     HbA1c, POC (controlled diabetic range)        ASSESSMENT/PLAN:   Diabetes mellitus without complication (HCC) A1c today is still well controlled, goal less than 7.  A1c 6.8 today.  Continue with Metformin 1000 mg twice daily.  She is up-to-date on lab work, would repeat in June when she is due for a lipid panel and could also obtain BMP at that time.  She is up-to-date on her eye exam, asked that she have a doctor fax the results to [place].  Idiopathic scoliosis Chronic issue.  She states that she was going to have surgery when she was younger, but was advised that she was too old and that she would have pain for the rest of her life.  She is not had any change in his pain.  Just occasionally gets spasms and flareups, but is doing well currently.  Did discuss physical therapy and advised her that this would likely be helpful in helping her live with the pain and work on stretches and strengthening exercises.  She was agreeable to this will follow up with PT.   Follow-up in 3 to 6 months.  Unknown Jim, DO [facility]

## 2020-04-06 ENCOUNTER — Encounter: Payer: Self-pay | Admitting: Family Medicine

## 2020-04-06 ENCOUNTER — Telehealth: Payer: Self-pay

## 2020-04-06 ENCOUNTER — Ambulatory Visit (INDEPENDENT_AMBULATORY_CARE_PROVIDER_SITE_OTHER): Payer: Medicare Other | Admitting: Family Medicine

## 2020-04-06 ENCOUNTER — Ambulatory Visit
Admission: RE | Admit: 2020-04-06 | Discharge: 2020-04-06 | Disposition: A | Discharge status: Home / Self Care | Payer: Medicare Other | Source: Ambulatory Visit | Attending: Family Medicine | Admitting: Family Medicine

## 2020-04-06 VITALS — BP 140/82 | HR 92 | Ht 68.5 in | Wt 181.0 lb

## 2020-04-06 DIAGNOSIS — Q7649 Other congenital malformations of spine, not associated with scoliosis: Secondary | ICD-10-CM | POA: Diagnosis not present

## 2020-04-06 DIAGNOSIS — M79671 Pain in right foot: Secondary | ICD-10-CM

## 2020-04-06 DIAGNOSIS — M545 Low back pain, unspecified: Secondary | ICD-10-CM | POA: Diagnosis not present

## 2020-04-06 DIAGNOSIS — M7989 Other specified soft tissue disorders: Secondary | ICD-10-CM | POA: Diagnosis not present

## 2020-04-06 DIAGNOSIS — W19XXXA Unspecified fall, initial encounter: Secondary | ICD-10-CM | POA: Insufficient documentation

## 2020-04-06 DIAGNOSIS — M47816 Spondylosis without myelopathy or radiculopathy, lumbar region: Secondary | ICD-10-CM | POA: Diagnosis not present

## 2020-04-06 DIAGNOSIS — S92351A Displaced fracture of fifth metatarsal bone, right foot, initial encounter for closed fracture: Secondary | ICD-10-CM | POA: Diagnosis not present

## 2020-04-06 DIAGNOSIS — G9589 Other specified diseases of spinal cord: Secondary | ICD-10-CM | POA: Diagnosis not present

## 2020-04-06 NOTE — Progress Notes (Addendum)
    SUBJECTIVE:   CHIEF COMPLAINT / HPI:   Recent fall Lori Jones experienced a fall today at home.  She was able to show me a video of her fall.  She reports that she was walking out of her door when she ended up falling down her front steps and landing on her bottom/lower back.  Since the fall, she has had a lot of pain on the outside of her right foot.  She is still able to walk although with significant difficulty.  She was able to drive to clinic today.  She notes that she has been having some issues recently with briefly losing her balance and needing to catch herself to avoid falls.  She notes that she has some mild back and shoulder pain.  She specifically denies fecal or urinary incontinence today.  She is not complaining of any radiculopathy.  PERTINENT  PMH / PSH: Hypothyroidism, scoliosis.  OBJECTIVE:   BP 140/82   Pulse 92   Ht 5' 8.5" (1.74 m)   Wt 181 lb (82.1 kg)   SpO2 98%   BMI 27.12 kg/m   General: Alert and cooperative and appears to be in no acute distress.  Able to stand up and walk to the exam table and step up to the exam table with great difficulty.  She had a significant limp and did need to hold onto the table and countertop. Back: Mild tenderness to percussion around T12, L1.  Some paraspinal tenderness.  Scoliosis noted.  Mild tenderness to the right SI joint. Right foot Inspection: Mild swelling without redness noted at the base of the fifth metatarsal Palpation: Significant tenderness noted with palpation with bump at the base of fifth metatarsal Neurovascularly intact Range of motion normal with good mobility of the ankle and toes No tenderness with ankle squeeze or palpation of the malleoli.  ASSESSMENT/PLAN:   Fall Based on [place] ankle rules, she would benefit from a foot x-ray to evaluate possible fracture of the fifth metatarsal.  I would like to rule out a Jones fracture.  She was encouraged to continue to use ibuprofen for pain control.  We  were able to obtain a surgical boot/stiff soled shoe from sports medicine.  She will go to sports medicine immediately after the visit to obtain said shoe. -Follow-up foot x-rays to determine next steps -Ibuprofen for pain control  Low suspicion for vertebral fracture or other based on age and history of scoliosis and hypothyroidism, I will get lumbar x-rays to ensure no compression fracture.    Update: At around 3:30 PM, her x-ray resulted which indicated an avulsion fracture of the fifth metatarsal.  No Jones fracture.  This case was discussed with Dr. Alfredo Jones.  She already has a cam walker boot.  We will have her get a repeat x-ray before returning to clinic in 2 weeks for reassessment.  If she appears to be well-healing at that time, will consider transition to a postsurgical shoe.  She was called and these results and the plan was explained to her and her daughter.  All questions were answered at that time.  Pain control with ibuprofen and Tylenol for now.  Lori Mo, MD [facility]

## 2020-04-06 NOTE — Addendum Note (Signed)
Addended by: Nada Libman on: 04/06/2020 01:04 PM   Modules accepted: Orders

## 2020-04-06 NOTE — Patient Instructions (Signed)
It was great to see you today.  Here is a quick review of the things we talked about:   Ankle pain: I am sorry you are buyers fall this morning.  I think we do need to get an x-ray to make sure there is not a Jones fracture which can have difficulty healing on its own.  For your pain, I recommend you continue to take your ibuprofen 800 mg every 8 hours as needed for discomfort.  Please also go to the sports medicine clinic today and tell them that you were sent there to pick up a surgical boot or a cam boot.  Aundra Millet will help you pick up the appropriate boot which will help stabilize her foot.  It is okay to remove the boot for her x-ray.  Back pain: I am going to get an x-ray of your lower back to make sure there is no possibility of a fracture to your vertebrae.  For both your x-rays, you can walk-in to [facility] imaging at any time to have them done.

## 2020-04-06 NOTE — Telephone Encounter (Signed)
Received phone call from Ben [place] at [facility] with the following call report of patient's recent imaging:  Fracture of proximal fifth metatarsal with displacement of fracture fragments. No other fracture. No dislocation. Narrowing first MTP joint with mild bunion formation in this area. Other joint spaces appear unremarkable. Small calcaneal spurs.   Forwarding to ordering provider.   Veronda Prude, RN

## 2020-04-06 NOTE — Assessment & Plan Note (Addendum)
Based on [place] ankle rules, she would benefit from a foot x-ray to evaluate possible fracture of the fifth metatarsal.  I would like to rule out a Jones fracture.  She was encouraged to continue to use ibuprofen for pain control.  We were able to obtain a surgical boot/stiff soled shoe from sports medicine.  She will go to sports medicine immediately after the visit to obtain said shoe. -Follow-up foot x-rays to determine next steps -Ibuprofen for pain control  Low suspicion for vertebral fracture or other based on age and history of scoliosis and hypothyroidism, I will get lumbar x-rays to ensure no compression fracture.

## 2020-04-14 DIAGNOSIS — S92351A Displaced fracture of fifth metatarsal bone, right foot, initial encounter for closed fracture: Secondary | ICD-10-CM | POA: Diagnosis not present

## 2020-04-19 ENCOUNTER — Ambulatory Visit: Payer: Medicare Other | Admitting: Family Medicine

## 2020-04-24 ENCOUNTER — Ambulatory Visit: Payer: Medicare HMO

## 2020-04-25 NOTE — Progress Notes (Signed)
    SUBJECTIVE:   CHIEF COMPLAINT / HPI:   Proximal fifth metatarsal fracture follow-up Patient seen on 1/6 by Dr. Marc Morgans showed minimally displaced fracture of proximal fifth metatarsal Not a Jones fracture Patient was placed in a walking boot She was seen at [facility] on 1/16 and had repeat XR Has been using walking boot and crutches Taking ibuprofen at home, didn't use mobic Has f/u on 2/11 with Ortho  Got Pap with Dr. Randol Kern recently   PERTINENT  PMH / PSH: T2DM, hypothyroidism, HLD, schizoaffective disorder  OBJECTIVE:   BP 110/60   Pulse 70   Ht 5' 8.5" (1.74 m)   Wt 182 lb (82.6 kg)   SpO2 99%   BMI 27.27 kg/m    Physical Exam:  General: 60 y.o. female in NAD Lungs: Breathing comfortably on room air Extremities: Right foot with swelling and tenderness palpation over proximal fifth metatarsal, slight resolving ecchymosis over distal metatarsals, able to wiggle all toes, 2+ dorsalis pedis pulse   ASSESSMENT/PLAN:   Closed displaced fracture of fifth metatarsal bone of right foot Patient is currently following with a more [facility] for this problem.  Still in a walking boot and has crutches.  Advised her that if she does not have pain with just using the walking boot, she does not need to continue using crutches.  Advised that if she is having pain, would go back to using crutches.  She did not have any repeat x-rays today as she will be having them with Ortho on 2/11 as scheduled.  She seems to be doing well overall, no need for any pain medication.  Advised her that it is okay to not take meloxicam.  She can continue to take Tylenol as needed.  Follow-up in April for her diabetes.     Cleophas Dunker, [place]

## 2020-04-26 ENCOUNTER — Encounter: Payer: Self-pay | Admitting: Family Medicine

## 2020-04-26 ENCOUNTER — Other Ambulatory Visit: Payer: Self-pay

## 2020-04-26 ENCOUNTER — Ambulatory Visit (INDEPENDENT_AMBULATORY_CARE_PROVIDER_SITE_OTHER): Payer: Medicare Other | Admitting: Family Medicine

## 2020-04-26 ENCOUNTER — Ambulatory Visit: Payer: Medicare Other

## 2020-04-26 VITALS — BP 110/60 | HR 70 | Ht 68.5 in | Wt 182.0 lb

## 2020-04-26 DIAGNOSIS — R262 Difficulty in walking, not elsewhere classified: Secondary | ICD-10-CM | POA: Diagnosis not present

## 2020-04-26 DIAGNOSIS — S92351D Displaced fracture of fifth metatarsal bone, right foot, subsequent encounter for fracture with routine healing: Secondary | ICD-10-CM | POA: Diagnosis not present

## 2020-04-26 DIAGNOSIS — M545 Low back pain, unspecified: Secondary | ICD-10-CM | POA: Diagnosis not present

## 2020-04-26 DIAGNOSIS — M546 Pain in thoracic spine: Secondary | ICD-10-CM | POA: Diagnosis not present

## 2020-04-26 DIAGNOSIS — M62830 Muscle spasm of back: Secondary | ICD-10-CM | POA: Diagnosis not present

## 2020-04-26 DIAGNOSIS — G8929 Other chronic pain: Secondary | ICD-10-CM | POA: Diagnosis not present

## 2020-04-26 DIAGNOSIS — M6281 Muscle weakness (generalized): Secondary | ICD-10-CM | POA: Diagnosis not present

## 2020-04-26 DIAGNOSIS — S92351A Displaced fracture of fifth metatarsal bone, right foot, initial encounter for closed fracture: Secondary | ICD-10-CM | POA: Insufficient documentation

## 2020-04-26 NOTE — Assessment & Plan Note (Signed)
Patient is currently following with a more [facility] for this problem.  Still in a walking boot and has crutches.  Advised her that if she does not have pain with just using the walking boot, she does not need to continue using crutches.  Advised that if she is having pain, would go back to using crutches.  She did not have any repeat x-rays today as she will be having them with Ortho on 2/11 as scheduled.  She seems to be doing well overall, no need for any pain medication.  Advised her that it is okay to not take meloxicam.  She can continue to take Tylenol as needed.  Follow-up in April for her diabetes.

## 2020-04-26 NOTE — Patient Instructions (Signed)
Thank you for coming to see me today. It was a pleasure. Today we talked about:   You don't have to use the crutches all the time if you don't have pain while using just the boot, but if you have pain, use the crutches.    Follow up with orthopedics on 2/11 as scheduled.  Please follow-up with me in April.   If you have any questions or concerns, please do not hesitate to call the office at [phone].  Best,   [place] Constable, DO

## 2020-04-26 NOTE — Therapy (Signed)
[place], [place], [address] Phone: [phone]   Fax:  [phone]  Physical Therapy Evaluation  Patient Details  Name: Lori Jones MRN: [id number] Date of Birth: Jan 11, 1961 Referring Provider (PT): Kinnie Feil, MD   Encounter Date: 04/26/2020   PT End of Session - 04/26/20 0959    Visit Number 1    Number of Visits 9    Date for PT Re-Evaluation 06/24/20    Authorization Type [employer] MCR - FOTO 6th visit and 10th visit; PN 10th visit; KX modifier 15th visit    PT Start Time 1000    PT Stop Time 1046    PT Time Calculation (min) 46 min    Activity Tolerance Patient tolerated treatment well    Behavior During Therapy [facility] for tasks assessed/performed           Past Medical History:  Diagnosis Date  . Allergy   . Anemia   . Anginal pain (HCC)    hx of CPs  . Anxiety   . Arthritis   . Asthma    hx of in childhood   . Back pain, chronic   . Bursitis   . Depression   . Diabetes mellitus   . Heart murmur   . Herniated disc   . Hypercholesteremia   . Hypertension    no meds  . Hypothyroid   . Kidney disease    "pelvic kidney" bilat   . Scoliosis   . Seizures ([place])    hx of in childhood and again in college   . Stroke [facility])    hx of sun stroke while in college     Past Surgical History:  Procedure Laterality Date  . ABDOMINAL HYSTERECTOMY    . CESAREAN SECTION    . CHOLECYSTECTOMY N/A 09/08/2014   Procedure: LAPAROSCOPIC CHOLECYSTECTOMY;  Surgeon: Coralie Keens, MD;  Location: [facility];  Service: General;  Laterality: N/A;  . HAND SURGERY     dog bite  . INCISIONAL HERNIA REPAIR N/A 04/24/2016   Procedure: HERNIA REPAIR INCISIONAL;  Surgeon: Coralie Keens, MD;  Location: [place];  Service: General;  Laterality: N/A;  . INSERTION OF MESH N/A 04/24/2016   Procedure: INSERTION OF MESH;  Surgeon: Coralie Keens, MD;  Location: [place];  Service: General;  Laterality: N/A;  . Thumb  surgery    . VENTRAL HERNIA REPAIR N/A 09/08/2014   Procedure: VENTRAL HERNIA REPAIR;  Surgeon: Coralie Keens, MD;  Location: [facility];  Service: General;  Laterality: N/A;    There were no vitals filed for this visit.    Subjective Assessment - 04/26/20 1004    Subjective "I have been having some back pain all my life but it got worse back in 2012 when I was attacked by 3 pitbulls. They knocked me down on the concrete. I already had my usual pain but since then, I have random pain where I could be walking, and it feels like someone is punching me. When I am carrying items and walk for a while or if I am mopping, I will have more pain a day or two after. I use paper plates because it hurts when I wash dishes."    Pertinent History See PMH above. R 5th metatarsal fracture 04/06/2020    Limitations Lifting;Standing;Walking;House hold activities;Sitting   typically sits in recliner; has to have pillows across back (4 pillows) if sitting on her bed for comfort   How long can you sit  comfortably? "Probably 1-2 hours"    How long can you stand comfortably? "About 1-2 hours" prior to R 5th metatarsal fx    How long can you walk comfortably? "About 1-2 hours" prior to R 5th metatarsal fx    Diagnostic tests Lumbar: Sclerosis at the L3 level may reflect degenerative related changes however cannot exclude subacute, nondisplaced endplate deformities. Minimal depression of the superior T12 endplate with underlying sclerosis may reflect age indeterminate deformity versus degenerative changes. 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    Patient Stated Goals "I want to be able to walk everyday, and I want to be able to ride my bike for at least 40 minutes"    Currently in Pain? Yes    Pain Score 4     Pain Location Back    Pain Orientation Left;Mid;Lower    Pain Descriptors / Indicators Tightness;Tender    Pain Type Chronic pain    Pain Radiating  Towards Denies radicular symptoms    Pain Onset More than a month ago    Pain Frequency Intermittent    Aggravating Factors  Standing/walking for prolonged time period, bending down, lifting heavy objects    Pain Relieving Factors Sitting in recliner, ibuprofen/aleve    Effect of Pain on Daily Activities Difficulty with activities due to pain when lifting and in prolonged static positions and with prolonged walking              [facility] PT Assessment - 04/26/20 0001      Assessment   Medical Diagnosis Left-sided low back pain without sciatica, unspecified chronicity (M54.50)    Referring Provider (PT) Kinnie Feil, MD    Onset Date/Surgical Date --   several years; worsened in 2012   Hand Dominance Right    Next MD Visit 05/12/2020 for R foot. Follow up for low back in April 2022    Prior Therapy A long time ago for low back      Precautions   Precautions None      Restrictions   Weight Bearing Restrictions Yes    RLE Weight Bearing Weight bearing as tolerated   with boot; crutches as needed when pain is increased     Balance Screen   Has the patient fallen in the past 6 months Yes    How many times? At least 1 fall but stumbles often    Has the patient had a decrease in activity level because of a fear of falling?  Yes    Is the patient reluctant to leave their home because of a fear of falling?  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)      Cognition   Overall Cognitive Status Within Functional Limits  for tasks assessed      Observation/Other Assessments   Focus on Therapeutic Outcomes (FOTO)  37% functional status;  predicted 54% function      ROM / Strength   AROM / PROM / Strength AROM;Strength;PROM      AROM   Overall AROM Comments "pulling"/discomfort with FL, EXT, and SB to either side    AROM Assessment Site Lumbar    Lumbar Flexion 3 inches from malleoli with fingertips    Lumbar Extension WFL but has discomfort in low back    Lumbar - Right Side Bend 4 inches from lateral knee joint line/mid thigh    Lumbar - Left Side Bend 4 inches from lateral knee joint line/mid thigh    Lumbar - Right Rotation WFL    Lumbar - Left Rotation WFL      PROM   Overall PROM Comments Pain and pulling with gentle B hip and knee PROM - performed within tolerance      Strength   Overall Strength Comments BLE MMT grossly 4/5 with no pain. R ankle not tested secondary to 5th metatarsal fracture.    Strength Assessment Site Hip;Knee;Ankle      Palpation   Palpation comment TTP along bilateral thoracic and lumbar paraspinals and QL (L > R in lumbar region)                      Objective measurements completed on examination: See above findings.       [facility] Adult PT Treatment/Exercise - 04/26/20 0001      Self-Care   Self-Care Other Self-Care Comments    Other Self-Care Comments  HEP, anatomy of condition, stretching tight muscles and strengthening lengthened/weak muscles as a result of levoscoliosis as able for pain reduction, use of tennis ball for self STM/myofascial release, POC                  PT Education - 04/26/20 1528    Education Details HEP, anatomy of condition, stretching tight muscles and strengthening lengthened/weak muscles as a result of levoscoliosis as able for pain reduction, use of tennis ball for self STM/myofascial release, POC    Person(s) Educated Patient    Methods Explanation;Demonstration;Tactile cues;Verbal cues;Handout    Comprehension  Verbalized understanding;Returned demonstration;Verbal cues required;Tactile cues required;Need further instruction            PT Short Term Goals - 04/26/20 1654      PT SHORT TERM GOAL #1   Title Patient will be independent with initial HEP.    Baseline Pt provided initial HEP at evaluation 04/26/2020.    Time 4    Period Weeks    Status New    Target Date 05/24/20      PT SHORT TERM GOAL #2   Title Patient will report being able to tolerate static sitting for at least 30 minutes in a chair or on her bed.    Baseline Can tolerate 1-2 hours while seated in her recliner but has increased pain sitting upright - requested to lie down during evaluation but experienced pain in supine as well    Time 4    Period Weeks    Status New    Target Date 05/24/20      PT SHORT TERM GOAL #3   Title Patient will increase BLE MMT to at least 4+/5 grossly with exception of R ankle.    Baseline 4/5 and R ankle not tested    Time 4    Period Weeks    Status New    Target Date 05/24/20      PT SHORT  TERM GOAL #4   Title Patient will be able to ambulate for 30 minutes with </= 3/10 mid/low back pain pending status of R foot.    Baseline 4/10 pain at rest that increases with prolonged walking. Pt reports being able to stand/walk 1-2 hours but reports having increased pain typically    Time 4    Period Weeks    Status New    Target Date 05/24/20             PT Long Term Goals - 04/26/20 1655      PT LONG TERM GOAL #1   Title Patient will be independent with advanced HEP.    Baseline Pt provided initial HEP at evaluation 04/26/2020.    Time 8    Period Weeks    Status New    Target Date 06/21/20      PT LONG TERM GOAL #2   Title Patient's FOTO score will improve from 37% to 54% functional status to demonstrate improved perceived ability.    Baseline 37% functional status; predicted 54% function    Time 8    Period Weeks    Status New    Target Date 06/21/20      PT LONG TERM GOAL  #3   Title Patient will report being able to ride her bike for at least 40 minutes as desired pending status of R foot. Defer if limited due to RLE.    Baseline Pt unable to ride bike at this time due to mid/low back pain and R 5th metatarsal fracture    Time 8    Period Weeks    Status New    Target Date 06/21/20      PT LONG TERM GOAL #4   Title Patient will be able to perform lumbar AROM WFL with </= 3/10 pain.    Baseline 4/10 at rest during evaluation    Time 8    Period Weeks    Status New    Target Date 06/21/20                  Plan - 04/26/20 1027    Clinical Impression Statement Patient is a 60 year old female who presents to OPPT with complaints of left low back pain. 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 She is currently further limited due to fracturing R 5th metatarsal after a fall from missing her last step while leaving her house on 04/06/2020. She is currently walking WBAT with boot and uses crutches as needed. Pt has low tolerance in sitting position due to increased mid/low back and R foot pain and requested to lie down during initial portion of evaluation while obtaining history and subjective. She may benefit from skilled PT intervention to address deficits and allow for improved tolerance with daily activities as well as return to regular walking and riding her bike, but she may be limited due to her recent R foot injury.    Personal Factors and Comorbidities Age;Comorbidity 3+;Fitness;Past/Current Experience;Time since onset of injury/illness/exacerbation    Comorbidities see PMH above    Examination-Activity Limitations Bend;Carry;Lift;Locomotion Level;Reach Overhead;Squat;Stairs;Stand;Sit;Sleep    Examination-Participation Restrictions Cleaning;Community Activity;Driving;Meal Prep;Shop     Stability/Clinical Decision Making Stable/Uncomplicated    Clinical Decision Making Low    Rehab Potential Fair    PT Frequency 1x / week    PT Duration  8 weeks    PT Treatment/Interventions ADLs/Self Care Home Management;Aquatic Therapy;Cryotherapy;Electrical Stimulation;Iontophoresis 4mg /ml Dexamethasone;Moist Heat;Traction;Neuromuscular re-education;Balance training;Therapeutic exercise;Therapeutic activities;Functional mobility training;Stair training;Gait training;Patient/family education;Manual techniques;Energy conservation;Dry needling;Passive range of motion;Taping    PT Next Visit Plan 5xSTS. Eventual TUG vs 6MWT pending status of R foot potentially. Update HEP. Stretching and strengthening as tolerated. Manual techniques PRN. Core strengthening.    PT Home Exercise Plan [id number] - QL stretch    Consulted and Agree with Plan of Care Patient           Patient will benefit from skilled therapeutic intervention in order to improve the following deficits and impairments:  Decreased activity tolerance,Decreased balance,Decreased mobility,Decreased strength,Postural dysfunction,Improper body mechanics,Decreased endurance,Decreased range of motion,Difficulty walking,Increased muscle spasms,Pain  Visit Diagnosis: Chronic low back pain without sciatica, unspecified back pain laterality  Pain in thoracic spine  Muscle spasm of back  Difficulty in walking, not elsewhere classified  Muscle weakness (generalized)     Problem List Patient Active Problem List   Diagnosis Date Noted  . Closed displaced fracture of fifth metatarsal bone of right foot 04/26/2020  . Fall 04/06/2020  . Idiopathic scoliosis 04/05/2020  . Hymenoptera reaction 03/11/2020  . Costochondritis 09/14/2019  . Elevated blood pressure reading 09/14/2019  . Anxiety and depression 03/20/2018  . Breast lump in female 03/20/2018  . Gastroesophageal reflux disease 03/20/2018  . Other chest pain 10/04/2016  . Hair  loss 03/06/2016  . Back pain 02/08/2015  . HLD (hyperlipidemia) 10/06/2013  . Hypothyroidism 08/06/2013  . Schizoaffective disorder, unspecified type ([place]) 07/10/2013  . Diabetes mellitus without complication ([place]) [id number]  . Heart murmur 07/10/2013     Haydee Monica, PT, DPT 04/26/20 5:46 PM  [facility] [address] [place], [place], [address] Phone: [phone]   Fax:  [phone]  Name: Lori Jones MRN: [id number] Date of Birth: 1961/01/27

## 2020-05-02 ENCOUNTER — Other Ambulatory Visit: Payer: Self-pay

## 2020-05-02 ENCOUNTER — Ambulatory Visit: Payer: Medicare Other | Attending: Family Medicine

## 2020-05-02 DIAGNOSIS — M62830 Muscle spasm of back: Secondary | ICD-10-CM | POA: Diagnosis not present

## 2020-05-02 DIAGNOSIS — M546 Pain in thoracic spine: Secondary | ICD-10-CM | POA: Diagnosis not present

## 2020-05-02 DIAGNOSIS — G8929 Other chronic pain: Secondary | ICD-10-CM | POA: Diagnosis not present

## 2020-05-02 DIAGNOSIS — R262 Difficulty in walking, not elsewhere classified: Secondary | ICD-10-CM

## 2020-05-02 DIAGNOSIS — M545 Low back pain, unspecified: Secondary | ICD-10-CM | POA: Diagnosis not present

## 2020-05-02 DIAGNOSIS — M6281 Muscle weakness (generalized): Secondary | ICD-10-CM | POA: Diagnosis not present

## 2020-05-02 NOTE — Therapy (Signed)
[place], [place], [address] Phone: [phone]   Fax:  [phone]  Physical Therapy Treatment  Patient Details  Name: Lori Jones MRN: [id number] Date of Birth: [birth year] Referring Provider (PT): Kinnie Feil, MD   Encounter Date: 05/02/2020   PT End of Session - 05/02/20 1612    Visit Number 2    Number of Visits 9    Date for PT Re-Evaluation 06/24/20    Authorization Type [employer] [place] 6th visit and 10th visit; PN 10th visit; KX modifier 15th visit    PT Start Time 1415    PT Stop Time 1500    PT Time Calculation (min) 45 min    Activity Tolerance Patient tolerated treatment well    Behavior During Therapy [facility] for tasks assessed/performed           Past Medical History:  Diagnosis Date  . Allergy   . Anemia   . Anginal pain (HCC)    hx of CPs  . Anxiety   . Arthritis   . Asthma    hx of in childhood   . Back pain, chronic   . Bursitis   . Depression   . Diabetes mellitus   . Heart murmur   . Herniated disc   . Hypercholesteremia   . Hypertension    no meds  . Hypothyroid   . Kidney disease    "pelvic kidney" bilat   . Scoliosis   . Seizures ([place])    hx of in childhood and again in college   . Stroke [facility])    hx of sun stroke while in college     Past Surgical History:  Procedure Laterality Date  . ABDOMINAL HYSTERECTOMY    . CESAREAN SECTION    . CHOLECYSTECTOMY N/A 09/08/2014   Procedure: LAPAROSCOPIC CHOLECYSTECTOMY;  Surgeon: Coralie Keens, MD;  Location: [facility];  Service: General;  Laterality: N/A;  . HAND SURGERY     dog bite  . INCISIONAL HERNIA REPAIR N/A 04/24/2016   Procedure: HERNIA REPAIR INCISIONAL;  Surgeon: Coralie Keens, MD;  Location: [place];  Service: General;  Laterality: N/A;  . INSERTION OF MESH N/A 04/24/2016   Procedure: INSERTION OF MESH;  Surgeon: Coralie Keens, MD;  Location: [place];  Service: General;  Laterality: N/A;  . Thumb  surgery    . VENTRAL HERNIA REPAIR N/A 09/08/2014   Procedure: VENTRAL HERNIA REPAIR;  Surgeon: Coralie Keens, MD;  [facility];  Service: General;  Laterality: N/A;    There were no vitals filed for this visit.   Subjective Assessment - 05/02/20 1423    Subjective Pt reports her back was really hurting after her eval, but did go down by the next day. She has been able to do some of the exercises.    Pertinent History See PMH above. R 5th metatarsal fracture 04/06/2020    Limitations Lifting;Standing;Walking;House hold activities;Sitting   typically sits in recliner; has to have pillows across back (4 pillows) if sitting on her bed for comfort   How long can you sit comfortably? "Probably 1-2 hours"    How long can you stand comfortably? "About 1-2 hours" prior to R 5th metatarsal fx    How long can you walk comfortably? "About 1-2 hours" prior to R 5th metatarsal fx    Diagnostic tests Lumbar: Sclerosis at the L3 level may reflect degenerative related changes however cannot exclude subacute, nondisplaced endplate deformities. Minimal depression of  the superior T12 endplate with underlying sclerosis may reflect age indeterminate deformity versus degenerative changes. 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    Patient Stated Goals "I want to be able to walk everyday, and I want to be able to ride my bike for at least 40 minutes"    Currently in Pain? Yes    Pain Score 2     Pain Location Back    Pain Orientation Left;Mid;Lower    Pain Descriptors / Indicators Tightness;Tender    Pain Type Chronic pain    Pain Onset More than a month ago                             [facility] Adult PT Treatment/Exercise - 05/02/20 0001      Transfers   Transfers Supine to Sit    Supine to Sit --   verbal and visual cues for pushing through B UE with LE off edge of table for improved tolerance.   Comments instruction for flexing  opp LE to turn from supine to S/L      Ambulation/Gait   Gait Comments decreased WB R LE with left lateral lean      Self-Care   Self-Care Other Self-Care Comments    Other Self-Care Comments  anatomy, reviewing imaging, edu for importance of TrA/multifidi with picture of multifidi and using spine      Exercises   Exercises Lumbar      Lumbar Exercises: Stretches   Lower Trunk Rotation 5 reps    Other Lumbar Stretch Exercise seated R QL stretch (OH reach toward L)      Lumbar Exercises: Supine   Pelvic Tilt Limitations very difficult, pt able to perform small tilt with max verbal/tactile cueing and report of lower abdominal muscle activation      Lumbar Exercises: Sidelying   Other Sidelying Lumbar Exercises L thoracic rotation in R S/L c R LE extended and pillow under L knee [facility])                  PT Education - 05/02/20 1612    Education Details handout with additions to HEP, anatomy of inner core TrA and multifidi    Person(s) Educated Patient    Methods Explanation;Demonstration;Tactile cues;Verbal cues;Handout    Comprehension Verbalized understanding;Returned demonstration;Verbal cues required;Tactile cues required;Need further instruction            PT Short Term Goals - 04/26/20 1654      PT SHORT TERM GOAL #1   Title Patient will be independent with initial HEP.    Baseline Pt provided initial HEP at evaluation 04/26/2020.    Time 4    Period Weeks    Status New    Target Date 05/24/20      PT SHORT TERM GOAL #2   Title Patient will report being able to tolerate static sitting for at least 30 minutes in a chair or on her bed.    Baseline Can tolerate 1-2 hours while seated in her recliner but has increased pain sitting upright - requested to lie down during evaluation but experienced pain in supine as well    Time 4    Period Weeks    Status New    Target Date 05/24/20      PT SHORT TERM GOAL #3   Title Patient will increase BLE MMT to at least  4+/5 grossly with exception  of R ankle.    Baseline 4/5 and R ankle not tested    Time 4    Period Weeks    Status New    Target Date 05/24/20      PT SHORT TERM GOAL #4   Title Patient will be able to ambulate for 30 minutes with </= 3/10 mid/low back pain pending status of R foot.    Baseline 4/10 pain at rest that increases with prolonged walking. Pt reports being able to stand/walk 1-2 hours but reports having increased pain typically    Time 4    Period Weeks    Status New    Target Date 05/24/20             PT Long Term Goals - 04/26/20 1655      PT LONG TERM GOAL #1   Title Patient will be independent with advanced HEP.    Baseline Pt provided initial HEP at evaluation 04/26/2020.    Time 8    Period Weeks    Status New    Target Date 06/21/20      PT LONG TERM GOAL #2   Title Patient's FOTO score will improve from 37% to 54% functional status to demonstrate improved perceived ability.    Baseline 37% functional status; predicted 54% function    Time 8    Period Weeks    Status New    Target Date 06/21/20      PT LONG TERM GOAL #3   Title Patient will report being able to ride her bike for at least 40 minutes as desired pending status of R foot. Defer if limited due to RLE.    Baseline Pt unable to ride bike at this time due to mid/low back pain and R 5th metatarsal fracture    Time 8    Period Weeks    Status New    Target Date 06/21/20      PT LONG TERM GOAL #4   Title Patient will be able to perform lumbar AROM WFL with </= 3/10 pain.    Baseline 4/10 at rest during evaluation    Time 8    Period Weeks    Status New    Target Date 06/21/20                 Plan - 05/02/20 1617    Clinical Impression Statement Pt presents with continued pain, but some periods of slight relief. She ambulates with increased weight bearing to L LE and lateral left lean. EDU for this likely increasing pressure to L lower back and thereby pain levels. Movement from pt  very slow this session with instruction for less painful supine <> sit and supine <> S/L transfers. Pt given further HEP to practice at home.    Personal Factors and Comorbidities Age;Comorbidity 3+;Fitness;Past/Current Experience;Time since onset of injury/illness/exacerbation    Comorbidities see PMH above    Examination-Activity Limitations Bend;Carry;Lift;Locomotion Level;Reach Overhead;Squat;Stairs;Stand;Sit;Sleep    Examination-Participation Restrictions Cleaning;Community Activity;Driving;Meal Prep;Shop    Stability/Clinical Decision Making Stable/Uncomplicated    Rehab Potential Fair    PT Frequency 1x / week    PT Duration 8 weeks    PT Treatment/Interventions ADLs/Self Care Home Management;Aquatic Therapy;Cryotherapy;Electrical Stimulation;Iontophoresis 4mg /ml Dexamethasone;Moist Heat;Traction;Neuromuscular re-education;Balance training;Therapeutic exercise;Therapeutic activities;Functional mobility training;Stair training;Gait training;Patient/family education;Manual techniques;Energy conservation;Dry needling;Passive range of motion;Taping    PT Next Visit Plan 5xSTS. Eventual TUG vs 6MWT pending status of R foot potentially. Assess respone to HEP and update PRN. Stretching and strengthening  as tolerated. Manual techniques PRN. Core strengthening.    PT Home Exercise Plan [id number] - QL stretch    Consulted and Agree with Plan of Care Patient           Patient will benefit from skilled therapeutic intervention in order to improve the following deficits and impairments:  Decreased activity tolerance,Decreased balance,Decreased mobility,Decreased strength,Postural dysfunction,Improper body mechanics,Decreased endurance,Decreased range of motion,Difficulty walking,Increased muscle spasms,Pain  Visit Diagnosis: Chronic low back pain without sciatica, unspecified back pain laterality  Pain in thoracic spine  Muscle spasm of back  Difficulty in walking, not elsewhere  classified  Muscle weakness (generalized)     Problem List Patient Active Problem List   Diagnosis Date Noted  . Closed displaced fracture of fifth metatarsal bone of right foot 04/26/2020  . Fall 04/06/2020  . Idiopathic scoliosis 04/05/2020  . Hymenoptera reaction 03/11/2020  . Costochondritis 09/14/2019  . Elevated blood pressure reading 09/14/2019  . Anxiety and depression 03/20/2018  . Breast lump in female 03/20/2018  . Gastroesophageal reflux disease 03/20/2018  . Other chest pain 10/04/2016  . Hair loss 03/06/2016  . Back pain 02/08/2015  . HLD (hyperlipidemia) 10/06/2013  . Hypothyroidism 08/06/2013  . Schizoaffective disorder, unspecified type ([place]) 07/10/2013  . Diabetes mellitus without complication ([place]) 18/29/9371  . Heart murmur 07/10/2013    Izell Colby, PT, DPT 05/02/2020, 4:19 PM  [facility] [address] [place], [place], [address] Phone: [phone]   Fax:  [phone]  Name: EMBERLEE SORTINO MRN: [id number] Date of Birth: 1961/03/11

## 2020-05-05 ENCOUNTER — Ambulatory Visit (INDEPENDENT_AMBULATORY_CARE_PROVIDER_SITE_OTHER): Payer: Medicare Other

## 2020-05-05 ENCOUNTER — Ambulatory Visit (HOSPITAL_COMMUNITY)
Admission: EM | Admit: 2020-05-05 | Discharge: 2020-05-05 | Disposition: A | Discharge status: Home / Self Care | Payer: Medicare Other | Attending: Emergency Medicine | Admitting: Emergency Medicine

## 2020-05-05 ENCOUNTER — Other Ambulatory Visit: Payer: Self-pay

## 2020-05-05 ENCOUNTER — Encounter (HOSPITAL_COMMUNITY): Payer: Self-pay | Admitting: Emergency Medicine

## 2020-05-05 DIAGNOSIS — R0789 Other chest pain: Secondary | ICD-10-CM | POA: Diagnosis not present

## 2020-05-05 DIAGNOSIS — Z889 Allergy status to unspecified drugs, medicaments and biological substances status: Secondary | ICD-10-CM | POA: Insufficient documentation

## 2020-05-05 DIAGNOSIS — Z791 Long term (current) use of non-steroidal anti-inflammatories (NSAID): Secondary | ICD-10-CM | POA: Diagnosis not present

## 2020-05-05 DIAGNOSIS — R918 Other nonspecific abnormal finding of lung field: Secondary | ICD-10-CM | POA: Diagnosis not present

## 2020-05-05 DIAGNOSIS — R079 Chest pain, unspecified: Secondary | ICD-10-CM | POA: Diagnosis not present

## 2020-05-05 DIAGNOSIS — Z79899 Other long term (current) drug therapy: Secondary | ICD-10-CM | POA: Diagnosis not present

## 2020-05-05 DIAGNOSIS — R0602 Shortness of breath: Secondary | ICD-10-CM | POA: Insufficient documentation

## 2020-05-05 DIAGNOSIS — Z20822 Contact with and (suspected) exposure to covid-19: Secondary | ICD-10-CM | POA: Diagnosis not present

## 2020-05-05 DIAGNOSIS — Z8249 Family history of ischemic heart disease and other diseases of the circulatory system: Secondary | ICD-10-CM | POA: Insufficient documentation

## 2020-05-05 DIAGNOSIS — Z881 Allergy status to other antibiotic agents status: Secondary | ICD-10-CM | POA: Diagnosis not present

## 2020-05-05 DIAGNOSIS — Z7982 Long term (current) use of aspirin: Secondary | ICD-10-CM | POA: Insufficient documentation

## 2020-05-05 HISTORY — DX: Unspecified fracture of right foot, initial encounter for closed fracture: S92.901A

## 2020-05-05 MED ORDER — AMOXICILLIN-POT CLAVULANATE 875-125 MG PO TABS: 1.0000 | ORAL_TABLET | Freq: Two times a day (BID) | ORAL | 0 refills | Status: DC

## 2020-05-05 MED ORDER — IBUPROFEN 800 MG PO TABS: 800.0000 mg | ORAL_TABLET | Freq: Two times a day (BID) | ORAL | 0 refills | Status: DC | PRN

## 2020-05-05 NOTE — ED Triage Notes (Signed)
Pt presents with left sided chest pain this morning around 6am. Described as sharp, intermittent. Pain 3/10 to left sided chest. Denies n/v/d. Denies diaphoresis.

## 2020-05-05 NOTE — ED Provider Notes (Signed)
[facility]    CSN: [id number] Arrival date & time: 05/05/20  1700      History   Chief Complaint Chief Complaint  Patient presents with  . Chest Pain  . Shortness of Breath    HPI Lori Jones is a 60 y.o. female.   Lori Jones presents with complaints of left sided chest pain. It started today while she was sitting in bed. Left sided and sharp for approximately 30 seconds and then subsided. Now with dull constant pain. Worse with movement of chest wall and with deep breathing. No cough. No shortness of breath. No diaphoresis. No arm or jaw pain. No new back pain. No palpitations. She has been evaluated for chest pain in the past without any MI history. History of hypertension, hypercholesteremia, dm, heart murmur. No known atherosclerosis. She recently broke her right foot. Last cardiac work up 08/2019 at another facility which was apparently negative.    ROS per HPI, negative if not otherwise mentioned.      Past Medical History:  Diagnosis Date  . Allergy   . Anemia   . Anginal pain (HCC)    hx of CPs  . Anxiety   . Arthritis   . Asthma    hx of in childhood   . Back pain, chronic   . Bursitis   . Depression   . Diabetes mellitus   . Foot fracture, right   . Heart murmur   . Herniated disc   . Hypercholesteremia   . Hypertension    no meds  . Hypothyroid   . Kidney disease    "pelvic kidney" bilat   . Scoliosis   . Seizures ([place])    hx of in childhood and again in college   . Stroke [facility])    hx of sun stroke while in college     Patient Active Problem List   Diagnosis Date Noted  . Closed displaced fracture of fifth metatarsal bone of right foot 04/26/2020  . Fall 04/06/2020  . Idiopathic scoliosis 04/05/2020  . Hymenoptera reaction 03/11/2020  . Costochondritis 09/14/2019  . Elevated blood pressure reading 09/14/2019  . Anxiety and depression 03/20/2018  . Breast lump in female 03/20/2018  . Gastroesophageal reflux disease  03/20/2018  . Other chest pain 10/04/2016  . Hair loss 03/06/2016  . Back pain 02/08/2015  . HLD (hyperlipidemia) 10/06/2013  . Hypothyroidism 08/06/2013  . Schizoaffective disorder, unspecified type ([place]) 07/10/2013  . Diabetes mellitus without complication ([place]) 123456  . Heart murmur 07/10/2013    Past Surgical History:  Procedure Laterality Date  . ABDOMINAL HYSTERECTOMY    . CESAREAN SECTION    . CHOLECYSTECTOMY N/A 09/08/2014   Procedure: LAPAROSCOPIC CHOLECYSTECTOMY;  Surgeon: Coralie Keens, MD;  Location[facility];  Service: General;  Laterality: N/A;  . HAND SURGERY     dog bite  . INCISIONAL HERNIA REPAIR N/A 04/24/2016   Procedure: HERNIA REPAIR INCISIONAL;  Surgeon: Coralie Keens, MD;  Location: [place];  Service: General;  Laterality: N/A;  . INSERTION OF MESH N/A 04/24/2016   Procedure: INSERTION OF MESH;  Surgeon: Coralie Keens, MD;  Location: [place];  Service: General;  Laterality: N/A;  . Thumb surgery    . VENTRAL HERNIA REPAIR N/A 09/08/2014   Procedure: VENTRAL HERNIA REPAIR;  Surgeon: Coralie Keens, MD;  [facility];  Service: General;  Laterality: N/A;    OB History   No obstetric history on file.      Home Medications  Prior to Admission medications   Medication Sig Start Date End Date Taking? Authorizing Provider  amoxicillin-clavulanate (AUGMENTIN) 875-125 MG tablet Take 1 tablet by mouth every 12 (twelve) hours. 05/05/20  Yes Augusto Gamble B, NP  aspirin EC 81 MG tablet Take 1 tablet (81 mg total) by mouth daily. 05/09/17  Yes Carlyle Dolly, MD  atorvastatin (LIPITOR) 40 MG tablet Take 1 tablet (40 mg total) by mouth daily. 05/31/19  Yes Meccariello, Bernita Raisin, DO  escitalopram (LEXAPRO) 10 MG tablet Take 1 tablet (10 mg total) by mouth daily. 07/31/19  Yes Meccariello, Bernita Raisin, DO  levothyroxine (SYNTHROID) 25 MCG tablet TAKE 1 TABLET(25 MCG) BY MOUTH DAILY BEFORE BREAKFAST 12/02/19  Yes Meccariello, Bernita Raisin, DO  metFORMIN (GLUCOPHAGE)  1000 MG tablet TAKE 1 TABLET BY MOUTH TWICE DAILY WITH A MEAL 03/16/20  Yes Meccariello, Bernita Raisin, DO  diphenhydrAMINE-zinc acetate (BENADRYL EXTRA STRENGTH) cream Apply 1 application topically 3 (three) times daily as needed for itching. 03/07/20   Carollee Leitz, MD  famotidine (PEPCID) 20 MG tablet Take 1 tablet (20 mg total) by mouth 2 (two) times daily. 03/07/20   Carollee Leitz, MD  ibuprofen (IBU) 800 MG tablet Take 1 tablet (800 mg total) by mouth 2 (two) times daily as needed for moderate pain. 05/05/20   Zigmund Gottron, NP  meclizine (ANTIVERT) 12.5 MG tablet Take 1 tablet (12.5 mg total) by mouth 3 (three) times daily as needed for dizziness. 04/30/18   Zenia Resides, MD  ondansetron (ZOFRAN ODT) 4 MG disintegrating tablet Take 1 tablet (4 mg total) by mouth every 8 (eight) hours as needed for nausea or vomiting. 04/30/18   Ward, Ozella Almond, PA-C    Family History Family History  Problem Relation Age of Onset  . Cancer Mother   . Depression Mother   . Diabetes Mother   . Heart disease Mother 61       Stents  . Hypertension Mother   . Hyperlipidemia Mother   . Thyroid disease Mother   . Cancer Father   . Sickle cell anemia Brother   . Diabetes Sister     Social History Social History   Tobacco Use  . Smoking status: Never Smoker  . Smokeless tobacco: Never Used  Vaping Use  . Vaping Use: Never used  Substance Use Topics  . Alcohol use: No    Alcohol/week: 0.0 standard drinks  . Drug use: No     Allergies   Clarithromycin, Sumatriptan, Canagliflozin, Canagliflozin-metformin hcl, Chlorhexidine, and Sumatriptan succinate   Review of Systems Review of Systems   Physical Exam Triage Vital Signs ED Triage Vitals  Enc Vitals Group     BP 05/05/20 1712 (!) 141/81     Pulse Rate 05/05/20 1712 91     Resp 05/05/20 1712 20     Temp 05/05/20 1712 99 F (37.2 C)     Temp Source 05/05/20 1712 Oral     SpO2 05/05/20 1712 99 %     Weight --      Height --       Head Circumference --      Peak Flow --      Pain Score 05/05/20 1725 3     Pain Loc --      Pain Edu? --      Excl. in [place]? --    No data found.  Updated Vital Signs BP (!) 141/81 (BP Location: Left Arm)   Pulse 91   Temp 99 F (  37.2 C) (Oral)   Resp 20   SpO2 99%    Physical Exam Constitutional:      General: She is not in acute distress.    Appearance: She is well-developed.  Cardiovascular:     Rate and Rhythm: Normal rate and regular rhythm.     Heart sounds: Normal heart sounds.  Pulmonary:     Effort: Pulmonary effort is normal.     Breath sounds: Normal breath sounds.  Chest:     Chest wall: Tenderness present.     Comments: Left chest wall is tender on palpation and with movement such as twisting of trunk Skin:    General: Skin is warm and dry.  Neurological:     Mental Status: She is alert and oriented to person, place, and time.    EKG:  NSR rate of 80 . Previous EKG was available for review. No stwave changes as interpreted by me.    UC Treatments / Results  Labs (all labs ordered are listed, but only abnormal results are displayed) Labs Reviewed  SARS CORONAVIRUS 2 (TAT 6-24 HRS)    EKG   Radiology DG Chest 2 View  Result Date: 05/05/2020 CLINICAL DATA:  Left-sided chest pain since this morning, sharp and intermittent EXAM: CHEST - 2 VIEW COMPARISON:  Chest radiograph April 23, 2019 FINDINGS: The heart size and mediastinal contours are unchanged. Hazy left greater than right basilar opacities. No pleural effusion. No pneumothorax. The visualized skeletal structures are unchanged. IMPRESSION: Hazy left greater than right basilar opacities, could reflect atelectasis or infection. Electronically Signed   By: Dahlia Bailiff MD   On: 05/05/2020 17:51    Procedures Procedures (including critical care time)  Medications Ordered in UC Medications - No data to display  Initial Impression / Assessment and Plan / UC Course  I have reviewed the triage  vital signs and the nursing notes.  Pertinent labs & imaging results that were available during my care of the patient were reviewed by me and considered in my medical decision making (see chart for details).     Chest wall pain on palpation, as well as some bibasilar opacities on chest xray. Opted to provide antibiotics with covid testing also pending. Ekg is reassuring here today. No shortness of breath . Follow up with PCp for reheck. Return precautions provided. Patient verbalized understanding and agreeable to plan.   Final Clinical Impressions(s) / UC Diagnoses   Final diagnoses:  Other chest pain     Discharge Instructions     Your chest xray has some subtle changes that is difficult to tell if it is related to infection vs not taking deep enough breaths because of your pain.  Since you do have pain also I have started antibiotics I would like you to take, and we will also test you for covid-19.  You can take ibuprofen 800mg  every 8-12 hours as needed for pain, take with food.  I would like you to recheck with your PCP in the next month . If worsening of symptoms please go to the ER.  Your EKG is reassuring here today.     ED Prescriptions    Medication Sig Dispense Auth. Provider   amoxicillin-clavulanate (AUGMENTIN) 875-125 MG tablet Take 1 tablet by mouth every 12 (twelve) hours. 14 tablet Augusto Gamble B, NP   ibuprofen (IBU) 800 MG tablet Take 1 tablet (800 mg total) by mouth 2 (two) times daily as needed for moderate pain. 60 tablet Zigmund Gottron, NP  PDMP not reviewed this encounter.   Zigmund Gottron, NP 05/05/20 [id number]

## 2020-05-05 NOTE — Discharge Instructions (Signed)
Your chest xray has some subtle changes that is difficult to tell if it is related to infection vs not taking deep enough breaths because of your pain.  Since you do have pain also I have started antibiotics I would like you to take, and we will also test you for covid-19.  You can take ibuprofen 800mg  every 8-12 hours as needed for pain, take with food.  I would like you to recheck with your PCP in the next month . If worsening of symptoms please go to the ER.  Your EKG is reassuring here today.

## 2020-05-06 LAB — SARS CORONAVIRUS 2 (TAT 6-24 HRS): SARS Coronavirus 2: NEGATIVE

## 2020-05-09 ENCOUNTER — Ambulatory Visit: Payer: Medicare Other

## 2020-05-09 ENCOUNTER — Other Ambulatory Visit: Payer: Self-pay

## 2020-05-09 DIAGNOSIS — M62830 Muscle spasm of back: Secondary | ICD-10-CM

## 2020-05-09 DIAGNOSIS — R262 Difficulty in walking, not elsewhere classified: Secondary | ICD-10-CM

## 2020-05-09 DIAGNOSIS — M545 Low back pain, unspecified: Secondary | ICD-10-CM

## 2020-05-09 DIAGNOSIS — M6281 Muscle weakness (generalized): Secondary | ICD-10-CM

## 2020-05-09 DIAGNOSIS — M546 Pain in thoracic spine: Secondary | ICD-10-CM

## 2020-05-09 NOTE — Therapy (Signed)
[place], [place], [address] Phone: [phone]   Fax:  [phone]  Physical Therapy Treatment  Patient Details  Name: Lori Jones MRN: [id number] Date of Birth: Aug 27, 1960 Referring Provider (PT): Kinnie Feil, MD   Encounter Date: 05/09/2020   PT End of Session - 05/09/20 1443    Visit Number 2    Number of Visits 9    Date for PT Re-Evaluation 06/24/20    Authorization Type [employer] [place] 6th visit and 10th visit; PN 10th visit; KX modifier 15th visit    PT Start Time 1424    PT Stop Time 1444    PT Time Calculation (min) 20 min    Activity Tolerance Patient tolerated treatment well    Behavior During Therapy [facility] for tasks assessed/performed           Past Medical History:  Diagnosis Date  . Allergy   . Anemia   . Anginal pain (HCC)    hx of CPs  . Anxiety   . Arthritis   . Asthma    hx of in childhood   . Back pain, chronic   . Bursitis   . Depression   . Diabetes mellitus   . Foot fracture, right   . Heart murmur   . Herniated disc   . Hypercholesteremia   . Hypertension    no meds  . Hypothyroid   . Kidney disease    "pelvic kidney" bilat   . Scoliosis   . Seizures ([place])    hx of in childhood and again in college   . Stroke [facility])    hx of sun stroke while in college     Past Surgical History:  Procedure Laterality Date  . ABDOMINAL HYSTERECTOMY    . CESAREAN SECTION    . CHOLECYSTECTOMY N/A 09/08/2014   Procedure: LAPAROSCOPIC CHOLECYSTECTOMY;  Surgeon: Coralie Keens, MD;  Location: [facility];  Service: General;  Laterality: N/A;  . HAND SURGERY     dog bite  . INCISIONAL HERNIA REPAIR N/A 04/24/2016   Procedure: HERNIA REPAIR INCISIONAL;  Surgeon: Coralie Keens, MD;  Location: [place];  Service: General;  Laterality: N/A;  . INSERTION OF MESH N/A 04/24/2016   Procedure: INSERTION OF MESH;  Surgeon: Coralie Keens, MD;  Location: [place];  Service: General;   Laterality: N/A;  . Thumb surgery    . VENTRAL HERNIA REPAIR N/A 09/08/2014   Procedure: VENTRAL HERNIA REPAIR;  Surgeon: Coralie Keens, MD;  Location: [facility];  Service: General;  Laterality: N/A;    There were no vitals filed for this visit.   Subjective Assessment - 05/09/20 1441    Subjective Pt reports she went to the urgent care Friday for chest pain. She thought she overdid PT, but it was 3 days after her session. They took XR of her chest and suspected infection, prescribing her antibiotics. She says she had a lot of stress/anxiety last week 20/10.    Pertinent History See PMH above. R 5th metatarsal fracture 04/06/2020    Limitations Lifting;Standing;Walking;House hold activities;Sitting   typically sits in recliner; has to have pillows across back (4 pillows) if sitting on her bed for comfort   How long can you sit comfortably? "Probably 1-2 hours"    How long can you stand comfortably? "About 1-2 hours" prior to R 5th metatarsal fx    How long can you walk comfortably? "About 1-2 hours" prior to R 5th metatarsal fx  Diagnostic tests Lumbar: Sclerosis at the L3 level may reflect degenerative related changes however cannot exclude subacute, nondisplaced endplate deformities. Minimal depression of the superior T12 endplate with underlying sclerosis may reflect age indeterminate deformity versus degenerative changes. 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    Patient Stated Goals "I want to be able to walk everyday, and I want to be able to ride my bike for at least 40 minutes"    Currently in Pain? No/denies    Pain Onset More than a month ago                                       PT Short Term Goals - 04/26/20 1654      PT SHORT TERM GOAL #1   Title Patient will be independent with initial HEP.    Baseline Pt provided initial HEP at evaluation 04/26/2020.    Time 4    Period Weeks    Status  New    Target Date 05/24/20      PT SHORT TERM GOAL #2   Title Patient will report being able to tolerate static sitting for at least 30 minutes in a chair or on her bed.    Baseline Can tolerate 1-2 hours while seated in her recliner but has increased pain sitting upright - requested to lie down during evaluation but experienced pain in supine as well    Time 4    Period Weeks    Status New    Target Date 05/24/20      PT SHORT TERM GOAL #3   Title Patient will increase BLE MMT to at least 4+/5 grossly with exception of R ankle.    Baseline 4/5 and R ankle not tested    Time 4    Period Weeks    Status New    Target Date 05/24/20      PT SHORT TERM GOAL #4   Title Patient will be able to ambulate for 30 minutes with </= 3/10 mid/low back pain pending status of R foot.    Baseline 4/10 pain at rest that increases with prolonged walking. Pt reports being able to stand/walk 1-2 hours but reports having increased pain typically    Time 4    Period Weeks    Status New    Target Date 05/24/20             PT Long Term Goals - 04/26/20 1655      PT LONG TERM GOAL #1   Title Patient will be independent with advanced HEP.    Baseline Pt provided initial HEP at evaluation 04/26/2020.    Time 8    Period Weeks    Status New    Target Date 06/21/20      PT LONG TERM GOAL #2   Title Patient's FOTO score will improve from 37% to 54% functional status to demonstrate improved perceived ability.    Baseline 37% functional status; predicted 54% function    Time 8    Period Weeks    Status New    Target Date 06/21/20      PT LONG TERM GOAL #3   Title Patient will report being able to ride her bike for at least 40 minutes as desired pending status of R foot. Defer if limited due to RLE.    Baseline Pt unable  to ride bike at this time due to mid/low back pain and R 5th metatarsal fracture    Time 8    Period Weeks    Status New    Target Date 06/21/20      PT LONG TERM GOAL #4    Title Patient will be able to perform lumbar AROM WFL with </= 3/10 pain.    Baseline 4/10 at rest during evaluation    Time 8    Period Weeks    Status New    Target Date 06/21/20                 Plan - 05/09/20 1443    Clinical Impression Statement Pt presents with concerns about PT after going to urgent care on Friday for left-sided chest pain. She is going to see her PCP on Friday, and was encouraged to get checked out to make sure she can continue PT wihtout worry.    Personal Factors and Comorbidities Age;Comorbidity 3+;Fitness;Past/Current Experience;Time since onset of injury/illness/exacerbation    Comorbidities see PMH above    Examination-Activity Limitations Bend;Carry;Lift;Locomotion Level;Reach Overhead;Squat;Stairs;Stand;Sit;Sleep    Examination-Participation Restrictions Cleaning;Community Activity;Driving;Meal Prep;Shop    Stability/Clinical Decision Making Stable/Uncomplicated    Rehab Potential Fair    PT Frequency 1x / week    PT Duration 8 weeks    PT Treatment/Interventions ADLs/Self Care Home Management;Aquatic Therapy;Cryotherapy;Electrical Stimulation;Iontophoresis 4mg /ml Dexamethasone;Moist Heat;Traction;Neuromuscular re-education;Balance training;Therapeutic exercise;Therapeutic activities;Functional mobility training;Stair training;Gait training;Patient/family education;Manual techniques;Energy conservation;Dry needling;Passive range of motion;Taping    PT Next Visit Plan 5xSTS. Eventual TUG vs 6MWT pending status of R foot potentially. Assess respone to HEP and update PRN. Stretching and strengthening as tolerated. Manual techniques PRN. Core strengthening.    PT Home Exercise Plan [id number] - QL stretch    Consulted and Agree with Plan of Care Patient           Patient will benefit from skilled therapeutic intervention in order to improve the following deficits and impairments:  Decreased activity tolerance,Decreased balance,Decreased mobility,Decreased  strength,Postural dysfunction,Improper body mechanics,Decreased endurance,Decreased range of motion,Difficulty walking,Increased muscle spasms,Pain  Visit Diagnosis: Chronic low back pain without sciatica, unspecified back pain laterality  Pain in thoracic spine  Muscle spasm of back  Difficulty in walking, not elsewhere classified  Muscle weakness (generalized)     Problem List Patient Active Problem List   Diagnosis Date Noted  . Closed displaced fracture of fifth metatarsal bone of right foot 04/26/2020  . Fall 04/06/2020  . Idiopathic scoliosis 04/05/2020  . Hymenoptera reaction 03/11/2020  . Costochondritis 09/14/2019  . Elevated blood pressure reading 09/14/2019  . Anxiety and depression 03/20/2018  . Breast lump in female 03/20/2018  . Gastroesophageal reflux disease 03/20/2018  . Other chest pain 10/04/2016  . Hair loss 03/06/2016  . Back pain 02/08/2015  . HLD (hyperlipidemia) 10/06/2013  . Hypothyroidism 08/06/2013  . Schizoaffective disorder, unspecified type ([place]) 07/10/2013  . Diabetes mellitus without complication ([place]) 18/29/9371  . Heart murmur 07/10/2013    Izell Sardis, PT, DPT 05/09/2020, 2:48 PM  [facility] [address] [place], [place], [address] Phone: [phone]   Fax:  [phone]  Name: Lori Jones MRN: [id number] Date of Birth: 1960-11-21

## 2020-05-12 ENCOUNTER — Ambulatory Visit (INDEPENDENT_AMBULATORY_CARE_PROVIDER_SITE_OTHER): Payer: Medicare Other | Admitting: Family Medicine

## 2020-05-12 ENCOUNTER — Encounter: Payer: Self-pay | Admitting: Family Medicine

## 2020-05-12 ENCOUNTER — Other Ambulatory Visit: Payer: Self-pay

## 2020-05-12 VITALS — BP 114/60 | HR 64 | Ht 68.5 in | Wt 182.8 lb

## 2020-05-12 DIAGNOSIS — M940 Chondrocostal junction syndrome [Tietze]: Secondary | ICD-10-CM | POA: Diagnosis not present

## 2020-05-12 DIAGNOSIS — S92351A Displaced fracture of fifth metatarsal bone, right foot, initial encounter for closed fracture: Secondary | ICD-10-CM | POA: Diagnosis not present

## 2020-05-12 NOTE — Patient Instructions (Signed)
Thank you for coming to see me today. It was a pleasure. Today we talked about:   You can stop the antibiotic.  Your lungs are not collapsed.  You are fine.  Your pain was for chest wall inflammation in the joint between rib and your breast bone.  Please follow-up with me in April.  If you have any questions or concerns, please do not hesitate to call the office at [phone].  Best,   [place] Constable, DO   Therapy and Counseling Resources Most providers on this list will take Medicaid. Patients with commercial insurance or Medicare should contact their insurance company to get a list of in network providers.  [facility]:Psychiatry and Counseling [address] [place]. [place], [place] [address] [place]  [address] [place], [place] [address]      [place] [address]  [place], [place] [address] [phone]  [place] [address] [place]  [place], [place] [address]       [phone]      Jinny Blossom [facility] [address], [place], [place]  Family Solutions:  [place]. [place] [phone]  [facility]:  [address] [place] [phone]  [facility] (under & uninsured) [address] [place] [phone]    [email or web address]    [place] [address] . [place]    [phone]  [facility]     Phone:  [phone]     [facility] [place]  [place] [address]      [place], [place] ext [place]: [place], [place], [place]   [place] ([place] therapist) [email or web address]  [place] [address]   [place] [address]     [phone]    [facility]   [address],  [place], [place]   [place]  [place]  [facility]  [address] [place], [place]        [phone]  Open Access/Walk In Clinic under & uninsured  [facility]  [address] [place], [place] Crisis [phone]  [facility] [place],  ([place])   [place], [place]: [phone]) 8:30 - 12; 1 - 2:30  [facility] of the [place],  [place], [place]    [phone]):8:30 - 12; 2 - 3PM  [facility] [employer],  [address]  [place]; [phone]):   Mon - Fri 8 AM - 5 PM  [facility] [place]  MWF 12:30 to 3:00 or call to schedule an appointment  [phone]  Specific Provider options Psychology Today  [email or web address] 1. click on find a therapist  2. enter your zip code 3. left side and select or tailor a therapist for your specific need.   [facility] Provider Directory [email or web address]  (Medicaid)   Follow all drop down to find a provider  [place] [phone] or [email or web address] [address], [place], [place] Recovery support and educational   24- Hour Availability:  .  Marland Kitchen  [facility]  . [place], [place] Crisis [phone]  . Family Service of the [employer] [phone]  [facility] Crisis Service  [phone]   . [place]  [phone] (after hours)  . Therapeutic Alternative/Mobile Crisis   [phone]  . [place] National Suicide Hotline  (636)291-3920 ([place])  . Call 911 or go to emergency room  . [employer]  [phone]);  [place] and [employer]   . [facility] ACCESS   [phone]); [place], [place], [place], [place], [place], [place], [place]

## 2020-05-12 NOTE — Assessment & Plan Note (Addendum)
Discussed with patient that her symptoms were most likely secondary to costochondritis.  Symptoms have now resolved, but she does have some tenderness to palpation on examination which she states was the area of her pain previously.  Reassured her that her chest x-ray does not show a collapsed lung and that the provider was likely trying to explain to her what atelectasis was.  Reviewed x-ray with patient.  Advised her that there is no problem with her lungs and given that she is breathing well, has never had any cough or fevers, can discontinue antibiotics as pneumonia is very unlikely.  She was very reassured by this information.  Advised to follow-up if this occurs again.

## 2020-05-12 NOTE — Progress Notes (Signed)
    SUBJECTIVE:   CHIEF COMPLAINT / HPI:   Chest pain follow-up Patient is seen in urgent care on 2/4 At that point was complaining of left-sided sharp chest pain that lasted for 30 seconds and then subsided, then had constant dull pain Was worse with movement of her chest wall and deep breathing Did not have any shortness of breath diaphoresis, arm or jaw pain EKG performed at that time that did not show any acute changes She was tender to palpation of her left chest wall Chest x-ray showed bibasilar opacities She was prescribed Augmentin and tested for Covid which was negative Took for three days and stopped because she got a bad yeast infection Her last lipid panel was on 09/14/2019, LDL was 71 at that time She has no prior history of MI She has no chest pain now Was very nervous because they told her she might "have a collapsed lung" No fevers, cough, or feeling unwell  PERTINENT  PMH / PSH: GERD, T2DM, hypothyroidism, schizoaffective disorder, anxiety with depression, hyperlipidemia  OBJECTIVE:   BP 114/60   Pulse 64   Ht 5' 8.5" (1.74 m)   Wt 182 lb 12.8 oz (82.9 kg)   SpO2 98%   BMI 27.39 kg/m    Physical Exam:  General: 60 y.o. female in NAD Cardio: RRR no m/r/g, tenderness to palpation left costochondral junction of fifth rib Lungs: CTAB, no wheezing, no rhonchi, no crackles, no IWOB on RA Skin: warm and dry Psych: Mood drastically improved after patient was advised that she does not have anything wrong with her lungs   ASSESSMENT/PLAN:   Costochondritis Discussed with patient that her symptoms were most likely secondary to costochondritis.  Symptoms have now resolved, but she does have some tenderness to palpation on examination which she states was the area of her pain previously.  Reassured her that her chest x-ray does not show a collapsed lung and that the provider was likely trying to explain to her what atelectasis was.  Reviewed x-ray with patient.   Advised her that there is no problem with her lungs and given that she is breathing well, has never had any cough or fevers, can discontinue antibiotics as pneumonia is very unlikely.  She was very reassured by this information.  Advised to follow-up if this occurs again.   She will follow-up in April for diabetes follow-up and other health maintenance as previously discussed.  Cleophas Dunker, [place]

## 2020-05-16 ENCOUNTER — Other Ambulatory Visit: Payer: Self-pay

## 2020-05-16 ENCOUNTER — Ambulatory Visit: Payer: Medicare Other

## 2020-05-16 DIAGNOSIS — M62830 Muscle spasm of back: Secondary | ICD-10-CM | POA: Diagnosis not present

## 2020-05-16 DIAGNOSIS — M545 Low back pain, unspecified: Secondary | ICD-10-CM | POA: Diagnosis not present

## 2020-05-16 DIAGNOSIS — M6281 Muscle weakness (generalized): Secondary | ICD-10-CM | POA: Diagnosis not present

## 2020-05-16 DIAGNOSIS — M546 Pain in thoracic spine: Secondary | ICD-10-CM | POA: Diagnosis not present

## 2020-05-16 DIAGNOSIS — R262 Difficulty in walking, not elsewhere classified: Secondary | ICD-10-CM

## 2020-05-16 DIAGNOSIS — G8929 Other chronic pain: Secondary | ICD-10-CM | POA: Diagnosis not present

## 2020-05-16 NOTE — Therapy (Signed)
[place], [place], [address] Phone: [phone]   Fax:  [phone]  Physical Therapy Treatment  Patient Details  Name: Lori Jones MRN: [id number] Date of Birth: 10/20/1960 Referring Provider (PT): Kinnie Feil, MD   Encounter Date: 05/16/2020   PT End of Session - 05/16/20 1424    Visit Number 3    Number of Visits 9    Date for PT Re-Evaluation 06/24/20    Authorization Type [employer] [place] 6th visit and 10th visit; PN 10th visit; KX modifier 15th visit    PT Start Time 1417    PT Stop Time 1458    PT Time Calculation (min) 41 min    Activity Tolerance Patient tolerated treatment well    Behavior During Therapy [facility] for tasks assessed/performed           Past Medical History:  Diagnosis Date  . Allergy   . Anemia   . Anginal pain (HCC)    hx of CPs  . Anxiety   . Arthritis   . Asthma    hx of in childhood   . Back pain, chronic   . Bursitis   . Depression   . Diabetes mellitus   . Foot fracture, right   . Heart murmur   . Herniated disc   . Hypercholesteremia   . Hypertension    no meds  . Hypothyroid   . Kidney disease    "pelvic kidney" bilat   . Scoliosis   . Seizures ([place])    hx of in childhood and again in college   . Stroke [facility])    hx of sun stroke while in college     Past Surgical History:  Procedure Laterality Date  . ABDOMINAL HYSTERECTOMY    . CESAREAN SECTION    . CHOLECYSTECTOMY N/A 09/08/2014   Procedure: LAPAROSCOPIC CHOLECYSTECTOMY;  Surgeon: Coralie Keens, MD;  Location: [facility];  Service: General;  Laterality: N/A;  . HAND SURGERY     dog bite  . INCISIONAL HERNIA REPAIR N/A 04/24/2016   Procedure: HERNIA REPAIR INCISIONAL;  Surgeon: Coralie Keens, MD;  Location: [place];  Service: General;  Laterality: N/A;  . INSERTION OF MESH N/A 04/24/2016   Procedure: INSERTION OF MESH;  Surgeon: Coralie Keens, MD;  Location: [place];  Service: General;   Laterality: N/A;  . Thumb surgery    . VENTRAL HERNIA REPAIR N/A 09/08/2014   Procedure: VENTRAL HERNIA REPAIR;  Surgeon: Coralie Keens, MD;  Location: [facility];  Service: General;  Laterality: N/A;    There were no vitals filed for this visit.   Subjective Assessment - 05/16/20 1422    Subjective Pt reports she saw her doctor who calmed her nerves. She had some back pain yesterday from moving some stuff, but not today.    Pertinent History See PMH above. R 5th metatarsal fracture 04/06/2020    Limitations Lifting;Standing;Walking;House hold activities;Sitting   typically sits in recliner; has to have pillows across back (4 pillows) if sitting on her bed for comfort   How long can you sit comfortably? "Probably 1-2 hours"    How long can you stand comfortably? "About 1-2 hours" prior to R 5th metatarsal fx    How long can you walk comfortably? "About 1-2 hours" prior to R 5th metatarsal fx    Diagnostic tests Lumbar: Sclerosis at the L3 level may reflect degenerative related changes however cannot exclude subacute, nondisplaced endplate deformities. Minimal depression  of the superior T12 endplate with underlying sclerosis may reflect age indeterminate deformity versus degenerative changes. 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    Patient Stated Goals "I want to be able to walk everyday, and I want to be able to ride my bike for at least 40 minutes"    Currently in Pain? No/denies    Pain Score 0-No pain    Pain Location Back    Pain Onset More than a month ago                             [facility] Adult PT Treatment/Exercise - 05/16/20 0001      Transfers   Comments reveiwed supine to S/L to sit   dizziness upon initial sitting requiring a few minutes to address it until it subsides     Self-Care   Other Self-Care Comments  continue to work on HEP, don't force movements if they cause too much pain      Lumbar  Exercises: Stretches   Lower Trunk Rotation 5 reps;10 seconds      Lumbar Exercises: Aerobic   Nustep L5 UE/LE 5'   arm length #0     Lumbar Exercises: Seated   Other Seated Lumbar Exercises marching with hands behind hips x10 B   L>R difficulty despite CAM boot R foot     Lumbar Exercises: Supine   Pelvic Tilt 20 reps;5 seconds    Pelvic Tilt Limitations small ROM    Heel Slides 10 reps    Heel Slides Limitations L only      Lumbar Exercises: Sidelying   Other Sidelying Lumbar Exercises B open books x12   straight arm                 PT Education - 05/16/20 1600    Education Details continue to focus on HEP, gentle and smaller and movements if painful    Person(s) Educated Patient    Methods Explanation;Demonstration;Verbal cues    Comprehension Verbalized understanding            PT Short Term Goals - 04/26/20 1654      PT SHORT TERM GOAL #1   Title Patient will be independent with initial HEP.    Baseline Pt provided initial HEP at evaluation 04/26/2020.    Time 4    Period Weeks    Status New    Target Date 05/24/20      PT SHORT TERM GOAL #2   Title Patient will report being able to tolerate static sitting for at least 30 minutes in a chair or on her bed.    Baseline Can tolerate 1-2 hours while seated in her recliner but has increased pain sitting upright - requested to lie down during evaluation but experienced pain in supine as well    Time 4    Period Weeks    Status New    Target Date 05/24/20      PT SHORT TERM GOAL #3   Title Patient will increase BLE MMT to at least 4+/5 grossly with exception of R ankle.    Baseline 4/5 and R ankle not tested    Time 4    Period Weeks    Status New    Target Date 05/24/20      PT SHORT TERM GOAL #4   Title Patient will be able to ambulate for 30 minutes with </=  3/10 mid/low back pain pending status of R foot.    Baseline 4/10 pain at rest that increases with prolonged walking. Pt reports being able to  stand/walk 1-2 hours but reports having increased pain typically    Time 4    Period Weeks    Status New    Target Date 05/24/20             PT Long Term Goals - 04/26/20 1655      PT LONG TERM GOAL #1   Title Patient will be independent with advanced HEP.    Baseline Pt provided initial HEP at evaluation 04/26/2020.    Time 8    Period Weeks    Status New    Target Date 06/21/20      PT LONG TERM GOAL #2   Title Patient's FOTO score will improve from 37% to 54% functional status to demonstrate improved perceived ability.    Baseline 37% functional status; predicted 54% function    Time 8    Period Weeks    Status New    Target Date 06/21/20      PT LONG TERM GOAL #3   Title Patient will report being able to ride her bike for at least 40 minutes as desired pending status of R foot. Defer if limited due to RLE.    Baseline Pt unable to ride bike at this time due to mid/low back pain and R 5th metatarsal fracture    Time 8    Period Weeks    Status New    Target Date 06/21/20      PT LONG TERM GOAL #4   Title Patient will be able to perform lumbar AROM WFL with </= 3/10 pain.    Baseline 4/10 at rest during evaluation    Time 8    Period Weeks    Status New    Target Date 06/21/20                 Plan - 05/16/20 1424    Clinical Impression Statement Pt had increased tolerance to treatment today, able to add in some gentle core stabilization. She has significant weakness in L LE hip flexors/knee extensors, but it is improving with ability to perfomring heel slide and march today. Some back pain noted with supine marching, but none with seated marching and C curve spine for lower abdominal firing.    Personal Factors and Comorbidities Age;Comorbidity 3+;Fitness;Past/Current Experience;Time since onset of injury/illness/exacerbation    Comorbidities see PMH above    Examination-Activity Limitations Bend;Carry;Lift;Locomotion Level;Reach  Overhead;Squat;Stairs;Stand;Sit;Sleep    Examination-Participation Restrictions Cleaning;Community Activity;Driving;Meal Prep;Shop    Stability/Clinical Decision Making Stable/Uncomplicated    Rehab Potential Fair    PT Frequency 1x / week    PT Duration 8 weeks    PT Treatment/Interventions ADLs/Self Care Home Management;Aquatic Therapy;Cryotherapy;Electrical Stimulation;Iontophoresis 4mg /ml Dexamethasone;Moist Heat;Traction;Neuromuscular re-education;Balance training;Therapeutic exercise;Therapeutic activities;Functional mobility training;Stair training;Gait training;Patient/family education;Manual techniques;Energy conservation;Dry needling;Passive range of motion;Taping    PT Next Visit Plan 5xSTS. Eventual TUG vs 6MWT pending status of R foot potentially. Assess respone to HEP and update PRN. Stretching and strengthening as tolerated. Manual techniques PRN. Core strengthening.    PT Home Exercise Plan [id number] - QL stretch    Consulted and Agree with Plan of Care Patient           Patient will benefit from skilled therapeutic intervention in order to improve the following deficits and impairments:  Decreased activity tolerance,Decreased balance,Decreased mobility,Decreased strength,Postural dysfunction,Improper body mechanics,Decreased  endurance,Decreased range of motion,Difficulty walking,Increased muscle spasms,Pain  Visit Diagnosis: Chronic low back pain without sciatica, unspecified back pain laterality  Pain in thoracic spine  Muscle spasm of back  Difficulty in walking, not elsewhere classified  Muscle weakness (generalized)     Problem List Patient Active Problem List   Diagnosis Date Noted  . Closed displaced fracture of fifth metatarsal bone of right foot 04/26/2020  . Fall 04/06/2020  . Idiopathic scoliosis 04/05/2020  . Hymenoptera reaction 03/11/2020  . Costochondritis 09/14/2019  . Elevated blood pressure reading 09/14/2019  . Anxiety and depression  03/20/2018  . Breast lump in female 03/20/2018  . Gastroesophageal reflux disease 03/20/2018  . Other chest pain 10/04/2016  . Hair loss 03/06/2016  . Back pain 02/08/2015  . HLD (hyperlipidemia) 10/06/2013  . Hypothyroidism 08/06/2013  . Schizoaffective disorder, unspecified type ([place]) 07/10/2013  . Diabetes mellitus without complication ([place]) 94/58/5929  . Heart murmur 07/10/2013    Izell Kendrick, PT, DPT 05/16/2020, 4:33 PM  [facility] [address] [place], [place], [address] Phone: [phone]   Fax:  [phone]  Name: MAYAN DOLNEY MRN: [id number] Date of Birth: October 29, 1960

## 2020-05-23 ENCOUNTER — Other Ambulatory Visit: Payer: Self-pay

## 2020-05-23 ENCOUNTER — Ambulatory Visit: Payer: Medicare Other

## 2020-05-23 DIAGNOSIS — M6281 Muscle weakness (generalized): Secondary | ICD-10-CM | POA: Diagnosis not present

## 2020-05-23 DIAGNOSIS — M546 Pain in thoracic spine: Secondary | ICD-10-CM | POA: Diagnosis not present

## 2020-05-23 DIAGNOSIS — R262 Difficulty in walking, not elsewhere classified: Secondary | ICD-10-CM

## 2020-05-23 DIAGNOSIS — G8929 Other chronic pain: Secondary | ICD-10-CM | POA: Diagnosis not present

## 2020-05-23 DIAGNOSIS — M62830 Muscle spasm of back: Secondary | ICD-10-CM | POA: Diagnosis not present

## 2020-05-23 DIAGNOSIS — M545 Low back pain, unspecified: Secondary | ICD-10-CM | POA: Diagnosis not present

## 2020-05-23 NOTE — Therapy (Signed)
[place], [place], [address] Phone: [phone]   Fax:  [phone]  Physical Therapy Treatment  Patient Details  Name: Lori Jones MRN: [id number] Date of Birth: 10/18/1960 Referring Provider (PT): Kinnie Feil, MD   Encounter Date: 05/23/2020   PT End of Session - 05/23/20 1422    Visit Number 4    Number of Visits 9    Date for PT Re-Evaluation 06/24/20    Authorization Type [employer] [place] 6th visit and 10th visit; PN 10th visit; KX modifier 15th visit    PT Start Time 1415    PT Stop Time 1458    PT Time Calculation (min) 43 min    Activity Tolerance Patient tolerated treatment well    Behavior During Therapy [facility] for tasks assessed/performed           Past Medical History:  Diagnosis Date  . Allergy   . Anemia   . Anginal pain (HCC)    hx of CPs  . Anxiety   . Arthritis   . Asthma    hx of in childhood   . Back pain, chronic   . Bursitis   . Depression   . Diabetes mellitus   . Foot fracture, right   . Heart murmur   . Herniated disc   . Hypercholesteremia   . Hypertension    no meds  . Hypothyroid   . Kidney disease    "pelvic kidney" bilat   . Scoliosis   . Seizures ([place])    hx of in childhood and again in college   . Stroke [facility])    hx of sun stroke while in college     Past Surgical History:  Procedure Laterality Date  . ABDOMINAL HYSTERECTOMY    . CESAREAN SECTION    . CHOLECYSTECTOMY N/A 09/08/2014   Procedure: LAPAROSCOPIC CHOLECYSTECTOMY;  Surgeon: Coralie Keens, MD;  [facility];  Service: General;  Laterality: N/A;  . HAND SURGERY     dog bite  . INCISIONAL HERNIA REPAIR N/A 04/24/2016   Procedure: HERNIA REPAIR INCISIONAL;  Surgeon: Coralie Keens, MD;  Location: [place];  Service: General;  Laterality: N/A;  . INSERTION OF MESH N/A 04/24/2016   Procedure: INSERTION OF MESH;  Surgeon: Coralie Keens, MD;  Location: [place];  Service: General;   Laterality: N/A;  . Thumb surgery    . VENTRAL HERNIA REPAIR N/A 09/08/2014   Procedure: VENTRAL HERNIA REPAIR;  Surgeon: Coralie Keens, MD;  [facility];  Service: General;  Laterality: N/A;    There were no vitals filed for this visit.   Subjective Assessment - 05/23/20 1419    Subjective Pt reports she felt okay after last visit. She says her back is starting to feel better.    Pertinent History See PMH above. R 5th metatarsal fracture 04/06/2020    Limitations Lifting;Standing;Walking;House hold activities;Sitting   typically sits in recliner; has to have pillows across back (4 pillows) if sitting on her bed for comfort   How long can you sit comfortably? "Probably 1-2 hours"    How long can you stand comfortably? "About 1-2 hours" prior to R 5th metatarsal fx    How long can you walk comfortably? "About 1-2 hours" prior to R 5th metatarsal fx    Diagnostic tests Lumbar: Sclerosis at the L3 level may reflect degenerative related changes however cannot exclude subacute, nondisplaced endplate deformities. Minimal depression of the superior T12 endplate with  underlying sclerosis may reflect age indeterminate deformity versus degenerative changes. 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    Patient Stated Goals "I want to be able to walk everyday, and I want to be able to ride my bike for at least 40 minutes"    Currently in Pain? No/denies    Pain Score 0-No pain    Pain Location Back    Pain Onset More than a month ago                             [facility] Adult PT Treatment/Exercise - 05/23/20 0001      Self-Care   Other Self-Care Comments  added to HEP, provided RTB for home      Lumbar Exercises: Aerobic   Nustep L5 UE/LE 6'   arm length #11     Lumbar Exercises: Seated   Sit to Stand 10 reps   2 sets   Sit to Stand Limitations RTB above knees    Other Seated Lumbar Exercises marching with RTB above  knees, x10 L    Other Seated Lumbar Exercises seated clams with RTB above knees 10x5"      Lumbar Exercises: Supine   Pelvic Tilt 20 reps;5 seconds    Pelvic Tilt Limitations small ROM      Lumbar Exercises: Sidelying   Other Sidelying Lumbar Exercises B open books x20   straight arm                   PT Short Term Goals - 04/26/20 1654      PT SHORT TERM GOAL #1   Title Patient will be independent with initial HEP.    Baseline Pt provided initial HEP at evaluation 04/26/2020.    Time 4    Period Weeks    Status New    Target Date 05/24/20      PT SHORT TERM GOAL #2   Title Patient will report being able to tolerate static sitting for at least 30 minutes in a chair or on her bed.    Baseline Can tolerate 1-2 hours while seated in her recliner but has increased pain sitting upright - requested to lie down during evaluation but experienced pain in supine as well    Time 4    Period Weeks    Status New    Target Date 05/24/20      PT SHORT TERM GOAL #3   Title Patient will increase BLE MMT to at least 4+/5 grossly with exception of R ankle.    Baseline 4/5 and R ankle not tested    Time 4    Period Weeks    Status New    Target Date 05/24/20      PT SHORT TERM GOAL #4   Title Patient will be able to ambulate for 30 minutes with </= 3/10 mid/low back pain pending status of R foot.    Baseline 4/10 pain at rest that increases with prolonged walking. Pt reports being able to stand/walk 1-2 hours but reports having increased pain typically    Time 4    Period Weeks    Status New    Target Date 05/24/20             PT Long Term Goals - 04/26/20 1655      PT LONG TERM GOAL #1   Title Patient will be independent with advanced HEP.  Baseline Pt provided initial HEP at evaluation 04/26/2020.    Time 8    Period Weeks    Status New    Target Date 06/21/20      PT LONG TERM GOAL #2   Title Patient's FOTO score will improve from 37% to 54% functional status to  demonstrate improved perceived ability.    Baseline 37% functional status; predicted 54% function    Time 8    Period Weeks    Status New    Target Date 06/21/20      PT LONG TERM GOAL #3   Title Patient will report being able to ride her bike for at least 40 minutes as desired pending status of R foot. Defer if limited due to RLE.    Baseline Pt unable to ride bike at this time due to mid/low back pain and R 5th metatarsal fracture    Time 8    Period Weeks    Status New    Target Date 06/21/20      PT LONG TERM GOAL #4   Title Patient will be able to perform lumbar AROM WFL with </= 3/10 pain.    Baseline 4/10 at rest during evaluation    Time 8    Period Weeks    Status New    Target Date 06/21/20                 Plan - 05/23/20 1422    Clinical Impression Statement Pt is making good progress thus far with no c/o back pain today. She was able to perform heel slide L LE without trouble and supine march focused on imprinted spine with minor pain. Seated marching progressed with red theraband above knees (given for home) and added sit <> stands with band as well (progressed from B UE assist to no UE assist). Pt notably was standing taller and moving more easily post session with increased trunk rotation.    Personal Factors and Comorbidities Age;Comorbidity 3+;Fitness;Past/Current Experience;Time since onset of injury/illness/exacerbation    Comorbidities see PMH above    Examination-Activity Limitations Bend;Carry;Lift;Locomotion Level;Reach Overhead;Squat;Stairs;Stand;Sit;Sleep    Examination-Participation Restrictions Cleaning;Community Activity;Driving;Meal Prep;Shop    Stability/Clinical Decision Making Stable/Uncomplicated    Rehab Potential Fair    PT Frequency 1x / week    PT Duration 8 weeks    PT Treatment/Interventions ADLs/Self Care Home Management;Aquatic Therapy;Cryotherapy;Electrical Stimulation;Iontophoresis 4mg /ml Dexamethasone;Moist  Heat;Traction;Neuromuscular re-education;Balance training;Therapeutic exercise;Therapeutic activities;Functional mobility training;Stair training;Gait training;Patient/family education;Manual techniques;Energy conservation;Dry needling;Passive range of motion;Taping    PT Next Visit Plan 5xSTS. Assess respone to HEP and update PRN. Stretching and strengthening as tolerated. Manual techniques PRN. Core strengthening.    PT Home Exercise Plan [email or web address] - QL stretch    Consulted and Agree with Plan of Care Patient           Patient will benefit from skilled therapeutic intervention in order to improve the following deficits and impairments:  Decreased activity tolerance,Decreased balance,Decreased mobility,Decreased strength,Postural dysfunction,Improper body mechanics,Decreased endurance,Decreased range of motion,Difficulty walking,Increased muscle spasms,Pain  Visit Diagnosis: Chronic low back pain without sciatica, unspecified back pain laterality  Pain in thoracic spine  Muscle spasm of back  Difficulty in walking, not elsewhere classified  Muscle weakness (generalized)     Problem List Patient Active Problem List   Diagnosis Date Noted  . Closed displaced fracture of fifth metatarsal bone of right foot 04/26/2020  . Fall 04/06/2020  . Idiopathic scoliosis 04/05/2020  . Hymenoptera reaction 03/11/2020  . Costochondritis 09/14/2019  .  Elevated blood pressure reading 09/14/2019  . Anxiety and depression 03/20/2018  . Breast lump in female 03/20/2018  . Gastroesophageal reflux disease 03/20/2018  . Other chest pain 10/04/2016  . Hair loss 03/06/2016  . Back pain 02/08/2015  . HLD (hyperlipidemia) 10/06/2013  . Hypothyroidism 08/06/2013  . Schizoaffective disorder, unspecified type ([place]) 07/10/2013  . Diabetes mellitus without complication ([place]) 06/10/8116  . Heart murmur 07/10/2013    Izell Brightwood, PT, DPT 05/23/2020, 3:43 PM  [facility] [address] [place], [place], [address] Phone: [phone]   Fax:  [phone]  Name: Lori Jones MRN: [id number] Date of Birth: 08/09/1960

## 2020-05-23 NOTE — Patient Instructions (Signed)
Access Code: [email or web address] URL: [email or web address] Date: 05/23/2020 Prepared by: Kathreen Cornfield  Exercises Standing Quadratus Lumborum Stretch with Doorway - 1 x daily - 7 x weekly - 3 sets - 4 reps - 30s hold Seated Quadratus Lumborum Stretch with Arm Overhead - 2 x daily - 7 x weekly - 3 sets - 10-30 seconds hold Supine Lower Trunk Rotation - 2 x daily - 7 x weekly - 1 sets - 10 reps - 3-5 seconds hold Open Books - 2 x daily - 7 x weekly - 1-2 sets - 10 reps Supine Posterior Pelvic Tilt - 2 x daily - 7 x weekly - 2 sets - 10 reps - 3-5 seconds hold Seated March with Resistance - 3-4 x weekly - 1-2 sets - 10 reps Sit to Stand with Resistance Around Legs - 3-4 x weekly - 1-2 sets - 10 reps

## 2020-05-30 ENCOUNTER — Ambulatory Visit: Payer: Medicare Other | Attending: Family Medicine

## 2020-05-30 ENCOUNTER — Other Ambulatory Visit: Payer: Self-pay

## 2020-05-30 ENCOUNTER — Encounter: Payer: Self-pay | Admitting: Family Medicine

## 2020-05-30 DIAGNOSIS — M6281 Muscle weakness (generalized): Secondary | ICD-10-CM

## 2020-05-30 DIAGNOSIS — M62830 Muscle spasm of back: Secondary | ICD-10-CM | POA: Insufficient documentation

## 2020-05-30 DIAGNOSIS — M545 Low back pain, unspecified: Secondary | ICD-10-CM | POA: Insufficient documentation

## 2020-05-30 DIAGNOSIS — G8929 Other chronic pain: Secondary | ICD-10-CM | POA: Diagnosis not present

## 2020-05-30 DIAGNOSIS — R262 Difficulty in walking, not elsewhere classified: Secondary | ICD-10-CM | POA: Insufficient documentation

## 2020-05-30 DIAGNOSIS — M546 Pain in thoracic spine: Secondary | ICD-10-CM | POA: Diagnosis not present

## 2020-05-30 DIAGNOSIS — M25571 Pain in right ankle and joints of right foot: Secondary | ICD-10-CM | POA: Insufficient documentation

## 2020-05-30 NOTE — Therapy (Signed)
[place], [place], [address] Phone: [phone]   Fax:  [phone]  Physical Therapy Treatment  Patient Details  Name: Lori Jones MRN: [id number] Date of Birth: 07/11/1960 Referring Provider (PT): Kinnie Feil, MD   Encounter Date: 05/30/2020   PT End of Session - 05/30/20 1438    Visit Number 5    Number of Visits 9    Date for PT Re-Evaluation 06/24/20    Authorization Type UHC MCR - FOTO 6th visit and 10th visit; PN 10th visit; KX modifier 15th visit    PT Start Time 1420    PT Stop Time 1500    PT Time Calculation (min) 40 min    Activity Tolerance Patient tolerated treatment well    Behavior During Therapy [facility] for tasks assessed/performed           Past Medical History:  Diagnosis Date  . Allergy   . Anemia   . Anginal pain (HCC)    hx of CPs  . Anxiety   . Arthritis   . Asthma    hx of in childhood   . Back pain, chronic   . Bursitis   . Depression   . Diabetes mellitus   . Foot fracture, right   . Heart murmur   . Herniated disc   . Hypercholesteremia   . Hypertension    no meds  . Hypothyroid   . Kidney disease    "pelvic kidney" bilat   . Scoliosis   . Seizures ([place])    hx of in childhood and again in college   . Stroke [facility])    hx of sun stroke while in college     Past Surgical History:  Procedure Laterality Date  . ABDOMINAL HYSTERECTOMY    . CESAREAN SECTION    . CHOLECYSTECTOMY N/A 09/08/2014   Procedure: LAPAROSCOPIC CHOLECYSTECTOMY;  Surgeon: Coralie Keens, MD;  [facility]: [facility];  Service: General;  Laterality: N/A;  . HAND SURGERY     dog bite  . INCISIONAL HERNIA REPAIR N/A 04/24/2016   Procedure: HERNIA REPAIR INCISIONAL;  Surgeon: Coralie Keens, MD;  Location: [place];  Service: General;  Laterality: N/A;  . INSERTION OF MESH N/A 04/24/2016   Procedure: INSERTION OF MESH;  Surgeon: Coralie Keens, MD;  Location: [place];  Service: General;   Laterality: N/A;  . Thumb surgery    . VENTRAL HERNIA REPAIR N/A 09/08/2014   Procedure: VENTRAL HERNIA REPAIR;  Surgeon: Coralie Keens, MD;  [facility];  Service: General;  Laterality: N/A;    There were no vitals filed for this visit.   Subjective Assessment - 05/30/20 1438    Subjective Pt reports her back is feelign about 80% better after she got her boot off. She really wants to focus on her foot.    Pertinent History See PMH above. R 5th metatarsal fracture 04/06/2020    Limitations Lifting;Standing;Walking;House hold activities;Sitting   typically sits in recliner; has to have pillows across back (4 pillows) if sitting on her bed for comfort   How long can you sit comfortably? "Probably 1-2 hours"    How long can you stand comfortably? "About 1-2 hours" prior to R 5th metatarsal fx    How long can you walk comfortably? "About 1-2 hours" prior to R 5th metatarsal fx    Diagnostic tests Lumbar: Sclerosis at the L3 level may reflect degenerative related changes however cannot exclude subacute, nondisplaced endplate deformities. Minimal depression  of the superior T12 endplate with underlying sclerosis may reflect age indeterminate deformity versus degenerative changes. 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    Patient Stated Goals "I want to be able to walk everyday, and I want to be able to ride my bike for at least 40 minutes"    Currently in Pain? No/denies    Pain Score 0-No pain    Pain Location Back    Pain Onset More than a month ago                             [facility] Adult PT Treatment/Exercise - 05/30/20 0001      Self-Care   Other Self-Care Comments  see pt edu      Lumbar Exercises: Standing   Lifting Limitations deadlift form with dowel along spine: significant difficulty maintaining contact with sacrum    Other Standing Lumbar Exercises Side stepping with red theraband   quad dominant      Lumbar Exercises: Seated   Sit to Stand 10 reps   8# DB   Sit to Stand Limitations RTB above knees    Other Seated Lumbar Exercises marching with RTB above knees, x10 L    Other Seated Lumbar Exercises --      Lumbar Exercises: Supine   Pelvic Tilt 20 reps;5 seconds    Pelvic Tilt Limitations RTB above knees    Bridge 10 reps    Bridge Limitations RTB above knees with PPT    Bridge with clamshell 5 reps;5 seconds    Bridge with Ball Squeeze Limitations RTB above knees    Other Supine Lumbar Exercises --                  PT Education - 05/30/20 1551    Education Details practice dead lift at home with stick/dowel, follow-up with MD about foot Rx    Person(s) Educated Patient    Methods Explanation;Demonstration;Tactile cues;Verbal cues    Comprehension Verbalized understanding;Returned demonstration;Need further instruction            PT Short Term Goals - 04/26/20 1654      PT SHORT TERM GOAL #1   Title Patient will be independent with initial HEP.    Baseline Pt provided initial HEP at evaluation 04/26/2020.    Time 4    Period Weeks    Status New    Target Date 05/24/20      PT SHORT TERM GOAL #2   Title Patient will report being able to tolerate static sitting for at least 30 minutes in a chair or on her bed.    Baseline Can tolerate 1-2 hours while seated in her recliner but has increased pain sitting upright - requested to lie down during evaluation but experienced pain in supine as well    Time 4    Period Weeks    Status New    Target Date 05/24/20      PT SHORT TERM GOAL #3   Title Patient will increase BLE MMT to at least 4+/5 grossly with exception of R ankle.    Baseline 4/5 and R ankle not tested    Time 4    Period Weeks    Status New    Target Date 05/24/20      PT SHORT TERM GOAL #4   Title Patient will be able to ambulate for 30 minutes with </= 3/10  mid/low back pain pending status of R foot.    Baseline 4/10 pain at rest that  increases with prolonged walking. Pt reports being able to stand/walk 1-2 hours but reports having increased pain typically    Time 4    Period Weeks    Status New    Target Date 05/24/20             PT Long Term Goals - 04/26/20 1655      PT LONG TERM GOAL #1   Title Patient will be independent with advanced HEP.    Baseline Pt provided initial HEP at evaluation 04/26/2020.    Time 8    Period Weeks    Status New    Target Date 06/21/20      PT LONG TERM GOAL #2   Title Patient's FOTO score will improve from 37% to 54% functional status to demonstrate improved perceived ability.    Baseline 37% functional status; predicted 54% function    Time 8    Period Weeks    Status New    Target Date 06/21/20      PT LONG TERM GOAL #3   Title Patient will report being able to ride her bike for at least 40 minutes as desired pending status of R foot. Defer if limited due to RLE.    Baseline Pt unable to ride bike at this time due to mid/low back pain and R 5th metatarsal fracture    Time 8    Period Weeks    Status New    Target Date 06/21/20      PT LONG TERM GOAL #4   Title Patient will be able to perform lumbar AROM WFL with </= 3/10 pain.    Baseline 4/10 at rest during evaluation    Time 8    Period Weeks    Status New    Target Date 06/21/20                 Plan - 05/30/20 1549    Clinical Impression Statement Pt presents with significant improvement in low back pain and funciton, walking over 10,000 steps yesterday and taking a walkbefore her session today. She is tryng to get an Rx for her foot as she would like to focus on that. Initiated hip hinge focus for squat and lifting today with significant difficulty and pt unable to perform. Gave pt exercise to practice at home with dowel along spine, as pt has trouble maintaining contact with sacrum.    Personal Factors and Comorbidities Age;Comorbidity 3+;Fitness;Past/Current Experience;Time since onset of  injury/illness/exacerbation    Comorbidities see PMH above    Examination-Activity Limitations Bend;Carry;Lift;Locomotion Level;Reach Overhead;Squat;Stairs;Stand;Sit;Sleep    Examination-Participation Restrictions Cleaning;Community Activity;Driving;Meal Prep;Shop    Stability/Clinical Decision Making Stable/Uncomplicated    Rehab Potential Fair    PT Frequency 1x / week    PT Duration 8 weeks    PT Treatment/Interventions ADLs/Self Care Home Management;Aquatic Therapy;Cryotherapy;Electrical Stimulation;Iontophoresis 4mg /ml Dexamethasone;Moist Heat;Traction;Neuromuscular re-education;Balance training;Therapeutic exercise;Therapeutic activities;Functional mobility training;Stair training;Gait training;Patient/family education;Manual techniques;Energy conservation;Dry needling;Passive range of motion;Taping    PT Next Visit Plan Hip Hinge, Assess respone to HEP and update PRN. Stretching and strengthening as tolerated. Manual techniques PRN. Core strengthening.    PT Home Exercise Plan [id number] - QL stretch    Consulted and Agree with Plan of Care Patient           Patient will benefit from skilled therapeutic intervention in order to improve the following deficits and impairments:  Decreased activity tolerance,Decreased balance,Decreased mobility,Decreased strength,Postural dysfunction,Improper body mechanics,Decreased endurance,Decreased range of motion,Difficulty walking,Increased muscle spasms,Pain  Visit Diagnosis: Chronic low back pain without sciatica, unspecified back pain laterality  Pain in thoracic spine  Muscle spasm of back  Difficulty in walking, not elsewhere classified  Muscle weakness (generalized)     Problem List Patient Active Problem List   Diagnosis Date Noted  . Closed displaced fracture of fifth metatarsal bone of right foot 04/26/2020  . Fall 04/06/2020  . Idiopathic scoliosis 04/05/2020  . Hymenoptera reaction 03/11/2020  . Costochondritis 09/14/2019   . Elevated blood pressure reading 09/14/2019  . Anxiety and depression 03/20/2018  . Breast lump in female 03/20/2018  . Gastroesophageal reflux disease 03/20/2018  . Other chest pain 10/04/2016  . Hair loss 03/06/2016  . Back pain 02/08/2015  . HLD (hyperlipidemia) 10/06/2013  . Hypothyroidism 08/06/2013  . Schizoaffective disorder, unspecified type ([place]) 07/10/2013  . Diabetes mellitus without complication ([place]) 33/58/2518  . Heart murmur 07/10/2013    Izell Montrose, PT, DPT 05/30/2020, 3:52 PM  [facility] [address] [place], [place], [address] Phone: [phone]   Fax:  [phone]  Name: Lori Jones MRN: [id number] Date of Birth: [birth year]

## 2020-06-06 ENCOUNTER — Ambulatory Visit: Payer: Medicare Other

## 2020-06-06 ENCOUNTER — Other Ambulatory Visit: Payer: Self-pay | Admitting: Family Medicine

## 2020-06-06 DIAGNOSIS — S92351D Displaced fracture of fifth metatarsal bone, right foot, subsequent encounter for fracture with routine healing: Secondary | ICD-10-CM

## 2020-06-06 NOTE — Progress Notes (Signed)
Patient requesting PT referral s/p fracture

## 2020-06-09 DIAGNOSIS — S92351A Displaced fracture of fifth metatarsal bone, right foot, initial encounter for closed fracture: Secondary | ICD-10-CM | POA: Diagnosis not present

## 2020-06-13 ENCOUNTER — Ambulatory Visit: Payer: Medicare Other

## 2020-06-13 ENCOUNTER — Other Ambulatory Visit: Payer: Self-pay

## 2020-06-13 DIAGNOSIS — M62830 Muscle spasm of back: Secondary | ICD-10-CM | POA: Diagnosis not present

## 2020-06-13 DIAGNOSIS — M546 Pain in thoracic spine: Secondary | ICD-10-CM

## 2020-06-13 DIAGNOSIS — M25571 Pain in right ankle and joints of right foot: Secondary | ICD-10-CM | POA: Diagnosis not present

## 2020-06-13 DIAGNOSIS — R262 Difficulty in walking, not elsewhere classified: Secondary | ICD-10-CM

## 2020-06-13 DIAGNOSIS — M6281 Muscle weakness (generalized): Secondary | ICD-10-CM

## 2020-06-13 DIAGNOSIS — M545 Low back pain, unspecified: Secondary | ICD-10-CM | POA: Diagnosis not present

## 2020-06-13 DIAGNOSIS — G8929 Other chronic pain: Secondary | ICD-10-CM | POA: Diagnosis not present

## 2020-06-13 NOTE — Therapy (Signed)
[place], [place], [address] Phone: [phone]   Fax:  [phone]  Physical Therapy Re-eval/Treatment  Patient Details  Name: Lori Jones MRN: [id number] Date of Birth: [birth year] Referring Provider (PT): Kinnie Feil, MD   Encounter Date: 06/13/2020   PT End of Session - 06/13/20 1716    Visit Number 6    Number of Visits 16    Date for PT Re-Evaluation 06/24/20    Authorization Type UHC [place] 6th visit and 10th visit; PN 10th visit; KX modifier 15th visit    PT Start Time 1416    PT Stop Time 1500    PT Time Calculation (min) 44 min    Activity Tolerance Patient tolerated treatment well    Behavior During Therapy [facility] for tasks assessed/performed           Past Medical History:  Diagnosis Date  . Allergy   . Anemia   . Anginal pain (HCC)    hx of CPs  . Anxiety   . Arthritis   . Asthma    hx of in childhood   . Back pain, chronic   . Bursitis   . Depression   . Diabetes mellitus   . Foot fracture, right   . Heart murmur   . Herniated disc   . Hypercholesteremia   . Hypertension    no meds  . Hypothyroid   . Kidney disease    "pelvic kidney" bilat   . Scoliosis   . Seizures ([place])    hx of in childhood and again in college   . Stroke [facility])    hx of sun stroke while in college     Past Surgical History:  Procedure Laterality Date  . ABDOMINAL HYSTERECTOMY    . CESAREAN SECTION    . CHOLECYSTECTOMY N/A 09/08/2014   Procedure: LAPAROSCOPIC CHOLECYSTECTOMY;  Surgeon: Coralie Keens, MD;  [facility];  Service: General;  Laterality: N/A;  . HAND SURGERY     dog bite  . INCISIONAL HERNIA REPAIR N/A 04/24/2016   Procedure: HERNIA REPAIR INCISIONAL;  Surgeon: Coralie Keens, MD;  Location: [place];  Service: General;  Laterality: N/A;  . INSERTION OF MESH N/A 04/24/2016   Procedure: INSERTION OF MESH;  Surgeon: Coralie Keens, MD;  Location: [place];  Service:  General;  Laterality: N/A;  . Thumb surgery    . VENTRAL HERNIA REPAIR N/A 09/08/2014   Procedure: VENTRAL HERNIA REPAIR;  Surgeon: Coralie Keens, MD;  [facility];  Service: General;  Laterality: N/A;    There were no vitals filed for this visit.   Subjective Assessment - 06/13/20 1423    Subjective Pt's back feels fine. She saw her foot doctor who wants to see her back in a month. She feels she didn't make as much progress this time with XR as her last XR comparison. Rx has been added to referral for 5th metatarsal fracture. She states she normally rolls her L ankle and has rolled it about 6x, and she rolled her R ankle once fracturing the 5th met head.    Pertinent History See PMH above. R 5th metatarsal fracture 04/06/2020    Limitations Lifting;Standing;Walking;House hold activities;Sitting   typically sits in recliner; has to have pillows across back (4 pillows) if sitting on her bed for comfort   How long can you sit comfortably? "Probably 1-2 hours"    How long can you stand comfortably? "About 1-2 hours" prior  to R 5th metatarsal fx    How long can you walk comfortably? "About 1-2 hours" prior to R 5th metatarsal fx    Diagnostic tests Lumbar: Sclerosis at the L3 level may reflect degenerative related changes however cannot exclude subacute, nondisplaced endplate deformities. Minimal depression of the superior T12 endplate with underlying sclerosis may reflect age indeterminate deformity versus degenerative changes. 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    Patient Stated Goals "I want to be able to walk everyday, and I want to be able to ride my bike for at least 40 minutes"    Currently in Pain? No/denies    Pain Score 0-No pain    Pain Location Back   R foot   Pain Onset More than a month ago              [facility] PT Assessment - 06/13/20 0001      ROM / Strength   AROM / PROM / Strength AROM;Strength      AROM    AROM Assessment Site Ankle    Right/Left Ankle Right;Left    Right Ankle Dorsiflexion 5   in prone, in supine   Right Ankle Plantar Flexion 40    Right Ankle Inversion 7    Right Ankle Eversion 17    Left Ankle Dorsiflexion 5   in prone   Left Ankle Plantar Flexion 45    Left Ankle Inversion 27    Left Ankle Eversion 18      PROM   PROM Assessment Site Ankle    Right/Left Ankle Left;Right    Right Ankle Dorsiflexion 5   knee bent   Left Ankle Dorsiflexion 5   knee bent     Strength   Strength Assessment Site Ankle;Knee    Right/Left Knee Right;Left    Right Knee Flexion 5/5    Right Knee Extension 5/5    Left Knee Flexion 5/5    Left Knee Extension 5/5    Right/Left Ankle Right;Left    Right Ankle Dorsiflexion 5/5    Right Ankle Plantar Flexion 5/5    Right Ankle Inversion 5/5    Right Ankle Eversion 4/5    Left Ankle Dorsiflexion 5/5    Left Ankle Plantar Flexion 5/5    Left Ankle Inversion 5/5    Left Ankle Eversion 5/5      Palpation   Palpation comment hypomobility of ankle AP TCJ mobs                         [facility] Adult PT Treatment/Exercise - 06/13/20 0001      Exercises   Exercises Ankle      Lumbar Exercises: Sidelying   Clam Right;10 reps;3 seconds    Clam Limitations manual approximation of pelvis      Ankle Exercises: Stretches   Gastroc Stretch 2 reps;30 seconds    Gastroc Stretch Limitations gastroc and soleus                  PT Education - 06/13/20 1713    Education Details HEP    Person(s) Educated Patient    Methods Explanation;Demonstration;Tactile cues;Verbal cues;Handout    Comprehension Verbalized understanding;Returned demonstration;Verbal cues required;Tactile cues required            PT Short Term Goals - 04/26/20 1654      PT SHORT TERM GOAL #1   Title Patient will be independent with initial  HEP.    Baseline Pt provided initial HEP at evaluation 04/26/2020.    Time 4    Period Weeks    Status New     Target Date 05/24/20      PT SHORT TERM GOAL #2   Title Patient will report being able to tolerate static sitting for at least 30 minutes in a chair or on her bed.    Baseline Can tolerate 1-2 hours while seated in her recliner but has increased pain sitting upright - requested to lie down during evaluation but experienced pain in supine as well    Time 4    Period Weeks    Status New    Target Date 05/24/20      PT SHORT TERM GOAL #3   Title Patient will increase BLE MMT to at least 4+/5 grossly with exception of R ankle.    Baseline 4/5 and R ankle not tested    Time 4    Period Weeks    Status New    Target Date 05/24/20      PT SHORT TERM GOAL #4   Title Patient will be able to ambulate for 30 minutes with </= 3/10 mid/low back pain pending status of R foot.    Baseline 4/10 pain at rest that increases with prolonged walking. Pt reports being able to stand/walk 1-2 hours but reports having increased pain typically    Time 4    Period Weeks    Status New    Target Date 05/24/20             PT Long Term Goals - 04/26/20 1655      PT LONG TERM GOAL #1   Title Patient will be independent with advanced HEP.    Baseline Pt provided initial HEP at evaluation 04/26/2020.    Time 8    Period Weeks    Status New    Target Date 06/21/20      PT LONG TERM GOAL #2   Title Patient's FOTO score will improve from 37% to 54% functional status to demonstrate improved perceived ability.    Baseline 37% functional status; predicted 54% function    Time 8    Period Weeks    Status New    Target Date 06/21/20      PT LONG TERM GOAL #3   Title Patient will report being able to ride her bike for at least 40 minutes as desired pending status of R foot. Defer if limited due to RLE.    Baseline Pt unable to ride bike at this time due to mid/low back pain and R 5th metatarsal fracture    Time 8    Period Weeks    Status New    Target Date 06/21/20      PT LONG TERM GOAL #4   Title  Patient will be able to perform lumbar AROM WFL with </= 3/10 pain.    Baseline 4/10 at rest during evaluation    Time 8    Period Weeks    Status New    Target Date 06/21/20                 Plan - 06/13/20 1710    Clinical Impression Statement Pt has not been diligent with HEP for back and increasing activity level with increased pain today during ankle exam. Reinforced importance of HEP to continue to work toward being pain free and added some calf stretching, as well as clams  for increased LE stability and and ankle flexibility. Pt demonstrates good ankle strength overall with mild eversion weakness to R ankle, and B increased soft tissue/joint restrictions. She will continue to benefit from further skilled PT to address ankle deficits and back pain.    Personal Factors and Comorbidities Age;Comorbidity 3+;Fitness;Past/Current Experience;Time since onset of injury/illness/exacerbation    Comorbidities see PMH above    Examination-Activity Limitations Bend;Carry;Lift;Locomotion Level;Reach Overhead;Squat;Stairs;Stand;Sit;Sleep    Examination-Participation Restrictions Cleaning;Community Activity;Driving;Meal Prep;Shop    Stability/Clinical Decision Making Stable/Uncomplicated    Rehab Potential Fair    PT Frequency 1x / week    PT Duration 8 weeks    PT Treatment/Interventions ADLs/Self Care Home Management;Aquatic Therapy;Cryotherapy;Electrical Stimulation;Iontophoresis 26m/ml Dexamethasone;Moist Heat;Traction;Neuromuscular re-education;Balance training;Therapeutic exercise;Therapeutic activities;Functional mobility training;Stair training;Gait training;Patient/family education;Manual techniques;Energy conservation;Dry needling;Passive range of motion;Taping    PT Next Visit Plan Hip Hinge, Assess respone to HEP and update PRN. Stretching and strengthening as tolerated. Manual techniques PRN. Core strengthening.    PT Home Exercise Plan [id number] - QL stretch    Consulted and Agree  with Plan of Care Patient           Patient will benefit from skilled therapeutic intervention in order to improve the following deficits and impairments:  Decreased activity tolerance,Decreased balance,Decreased mobility,Decreased strength,Postural dysfunction,Improper body mechanics,Decreased endurance,Decreased range of motion,Difficulty walking,Increased muscle spasms,Pain  Visit Diagnosis: Chronic low back pain without sciatica, unspecified back pain laterality  Pain in thoracic spine  Muscle spasm of back  Difficulty in walking, not elsewhere classified  Muscle weakness (generalized)  Pain in right ankle and joints of right foot     Problem List Patient Active Problem List   Diagnosis Date Noted  . Closed displaced fracture of fifth metatarsal bone of right foot 04/26/2020  . Fall 04/06/2020  . Idiopathic scoliosis 04/05/2020  . Hymenoptera reaction 03/11/2020  . Costochondritis 09/14/2019  . Elevated blood pressure reading 09/14/2019  . Anxiety and depression 03/20/2018  . Breast lump in female 03/20/2018  . Gastroesophageal reflux disease 03/20/2018  . Other chest pain 10/04/2016  . Hair loss 03/06/2016  . Back pain 02/08/2015  . HLD (hyperlipidemia) 10/06/2013  . Hypothyroidism 08/06/2013  . Schizoaffective disorder, unspecified type ([place] 07/10/2013  . Diabetes mellitus without complication ([place] 088/91/6945 . Heart murmur 07/10/2013    KIzell Sutton PT, DPT 06/13/2020, 5:17 PM  [facility] [address]Phone: [phone]  Fax:  [phone] Name: Lori GREENSPANMRN: [id number] of Birth: [birth year]

## 2020-06-14 ENCOUNTER — Ambulatory Visit (INDEPENDENT_AMBULATORY_CARE_PROVIDER_SITE_OTHER): Payer: Medicare Other | Admitting: Family Medicine

## 2020-06-14 ENCOUNTER — Encounter: Payer: Self-pay | Admitting: Family Medicine

## 2020-06-14 VITALS — BP 123/80 | HR 90 | Ht 68.0 in | Wt 183.8 lb

## 2020-06-14 DIAGNOSIS — E119 Type 2 diabetes mellitus without complications: Secondary | ICD-10-CM | POA: Diagnosis not present

## 2020-06-14 DIAGNOSIS — E039 Hypothyroidism, unspecified: Secondary | ICD-10-CM | POA: Diagnosis not present

## 2020-06-14 LAB — POCT UA - MICROALBUMIN
Creatinine, POC: 50 mg/dL
Microalbumin Ur, POC: 80 mg/L

## 2020-06-14 NOTE — Progress Notes (Signed)
    SUBJECTIVE:   CHIEF COMPLAINT / HPI:   T2DM with HLD Current regimen: Metformin 1000 mg twice daily CBGs hasn't been taking Last A1c 04/05/2020, 6.8 Last BMP 09/14/2019, WNL Last lipid panel 09/14/2019, LDL 71 Patient is on Lipitor 40 mg and no ACE/ARB, no diagnosis of hypertension She is doing a challenge where she walks 100 miles in March Last eye exam in 2021  Hypothyroidism Current regimen: synthroid 25 mcg Last TSH 09/14/2019, 3.20 No complaints  Pap smear at OB last year   PERTINENT  PMH / PSH: GERD, T2DM, hypothyroidism, schizoaffective disorder, HLD  OBJECTIVE:   BP 123/80   Pulse 90   Ht 5\' 8"  (1.727 m)   Wt 183 lb 12.8 oz (83.4 kg)   SpO2 99%   BMI 27.95 kg/m    Physical Exam:  General: 60 y.o. female in NAD Lungs: Breathing comfortably on room air Skin: warm and dry Extremities: No edema Psych: Mood and affect appropriate for circumstance, appropriate dress, thought process linear and logical, good insight, no SI  Results for orders placed or performed in visit on 06/14/20 (from the past 24 hour(s))  POCT UA - Microalbumin     Status: None   Collection Time: 06/14/20  2:25 PM  Result Value Ref Range   Microalbumin Ur, POC 80 mg/L   Creatinine, POC 50 mg/dL   Albumin/Creatinine Ratio, Urine, POC 30-300      ASSESSMENT/PLAN:   Diabetes mellitus without complication ([place]) Well-controlled.  Continue with current regimen.  Will obtain a BMP today.  She will come back in about a month or 2 for repeat A1c.  Congratulated her on her exercise goals.  Continue statin.  Obtain urine microalbumin today.  Moderately increased urine albumin to creatinine ratio.  We will discuss an ARB at her next appointment.  Hypothyroidism Continue with current dose of Synthroid.  Will obtain a TSH today.     Cleophas Dunker, [place]

## 2020-06-14 NOTE — Assessment & Plan Note (Signed)
Continue with current dose of Synthroid.  Will obtain a TSH today.

## 2020-06-14 NOTE — Patient Instructions (Signed)
Thank you for coming to see me today. It was a pleasure. Today we talked about:   We will get some labs today.  If they are abnormal or we need to do something about them, I will call you.  If they are normal, I will send you a message on MyChart (if it is active) or a letter in the mail.  If you don't hear from [place] in 2 weeks, please call the office at the number below.  Please follow-up with me in 1-2 months.  If you have any questions or concerns, please do not hesitate to call the office at [phone].  Best,   [place] Constable, DO

## 2020-06-14 NOTE — Assessment & Plan Note (Signed)
Well-controlled.  Continue with current regimen.  Will obtain a BMP today.  She will come back in about a month or 2 for repeat A1c.  Congratulated her on her exercise goals.  Continue statin.  Obtain urine microalbumin today.  Moderately increased urine albumin to creatinine ratio.  We will discuss an ARB at her next appointment.

## 2020-06-15 LAB — BASIC METABOLIC PANEL
BUN/Creatinine Ratio: 15 (ref 12–28)
BUN: 11 mg/dL (ref 8–27)
CO2: 21 mmol/L (ref 20–29)
Calcium: 9.4 mg/dL (ref 8.7–10.3)
Chloride: 102 mmol/L (ref 96–106)
Creatinine, Ser: 0.73 mg/dL (ref 0.57–1.00)
Glucose: 206 mg/dL — ABNORMAL HIGH (ref 65–99)
Potassium: 4 mmol/L (ref 3.5–5.2)
Sodium: 140 mmol/L (ref 134–144)
eGFR: 94 mL/min/{1.73_m2} (ref >59)

## 2020-06-15 LAB — TSH: TSH: 2.57 u[IU]/mL (ref 0.450–4.500)

## 2020-06-20 ENCOUNTER — Ambulatory Visit: Payer: Medicare Other

## 2020-06-20 ENCOUNTER — Other Ambulatory Visit: Payer: Self-pay

## 2020-06-20 DIAGNOSIS — M25571 Pain in right ankle and joints of right foot: Secondary | ICD-10-CM

## 2020-06-20 DIAGNOSIS — M546 Pain in thoracic spine: Secondary | ICD-10-CM

## 2020-06-20 DIAGNOSIS — R262 Difficulty in walking, not elsewhere classified: Secondary | ICD-10-CM | POA: Diagnosis not present

## 2020-06-20 DIAGNOSIS — G8929 Other chronic pain: Secondary | ICD-10-CM | POA: Diagnosis not present

## 2020-06-20 DIAGNOSIS — M545 Low back pain, unspecified: Secondary | ICD-10-CM | POA: Diagnosis not present

## 2020-06-20 DIAGNOSIS — M6281 Muscle weakness (generalized): Secondary | ICD-10-CM

## 2020-06-20 DIAGNOSIS — M62830 Muscle spasm of back: Secondary | ICD-10-CM | POA: Diagnosis not present

## 2020-06-20 NOTE — Therapy (Signed)
[place], [place], [address] Phone: [phone]   Fax:  [phone]  Physical Therapy Treatment  Patient Details  Name: Lori Jones MRN: [id number] Date of Birth: 1961-01-25 Referring Provider (PT): Kinnie Feil, MD   Encounter Date: 06/20/2020   PT End of Session - 06/20/20 0809    Visit Number 7    Number of Visits 16    Date for PT Re-Evaluation 06/24/20    Authorization Type UHC MCR - FOTO 6th visit and 10th visit; PN 10th visit; KX modifier 15th visit    PT Start Time 0800    PT Stop Time 0842    PT Time Calculation (min) 42 min    Activity Tolerance Patient tolerated treatment well    Behavior During Therapy [facility] for tasks assessed/performed           Past Medical History:  Diagnosis Date  . Allergy   . Anemia   . Anginal pain (HCC)    hx of CPs  . Anxiety   . Arthritis   . Asthma    hx of in childhood   . Back pain, chronic   . Bursitis   . Depression   . Diabetes mellitus   . Foot fracture, right   . Heart murmur   . Herniated disc   . Hypercholesteremia   . Hypertension    no meds  . Hypothyroid   . Kidney disease    "pelvic kidney" bilat   . Scoliosis   . Seizures ([place])    hx of in childhood and again in college   . Stroke [facility])    hx of sun stroke while in college     Past Surgical History:  Procedure Laterality Date  . ABDOMINAL HYSTERECTOMY    . CESAREAN SECTION    . CHOLECYSTECTOMY N/A 09/08/2014   Procedure: LAPAROSCOPIC CHOLECYSTECTOMY;  Surgeon: Coralie Keens, MD;  [facility];  Service: General;  Laterality: N/A;  . HAND SURGERY     dog bite  . INCISIONAL HERNIA REPAIR N/A 04/24/2016   Procedure: HERNIA REPAIR INCISIONAL;  Surgeon: Coralie Keens, MD;  Location: [place];  Service: General;  Laterality: N/A;  . INSERTION OF MESH N/A 04/24/2016   Procedure: INSERTION OF MESH;  Surgeon: Coralie Keens, MD;  Location: [place];  Service: General;   Laterality: N/A;  . Thumb surgery    . VENTRAL HERNIA REPAIR N/A 09/08/2014   Procedure: VENTRAL HERNIA REPAIR;  Surgeon: Coralie Keens, MD;  [facility];  Service: General;  Laterality: N/A;    There were no vitals filed for this visit.   Subjective Assessment - 06/20/20 0806    Subjective Pt reports her foot swells when she walks a lot, but it feels ok. She went to a concert this weekend where she stood the whole time and was worried, but ended up being ok. Her back feels ok - she has resumed exercises.    Pertinent History See PMH above. R 5th metatarsal fracture 04/06/2020    Limitations Lifting;Standing;Walking;House hold activities;Sitting   typically sits in recliner; has to have pillows across back (4 pillows) if sitting on her bed for comfort   How long can you sit comfortably? "Probably 1-2 hours"    How long can you stand comfortably? "About 1-2 hours" prior to R 5th metatarsal fx    How long can you walk comfortably? "About 1-2 hours" prior to R 5th metatarsal fx    Diagnostic  tests Lumbar: Sclerosis at the L3 level may reflect degenerative related changes however cannot exclude subacute, nondisplaced endplate deformities. Minimal depression of the superior T12 endplate with underlying sclerosis may reflect age indeterminate deformity versus degenerative changes. 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    Patient Stated Goals "I want to be able to walk everyday, and I want to be able to ride my bike for at least 40 minutes"    Currently in Pain? No/denies    Pain Score 0-No pain    Pain Onset More than a month ago    Multiple Pain Sites Yes    Pain Score 0    Pain Location Foot    Pain Orientation Right;Lateral              [facility] PT Assessment - 06/20/20 0001      Assessment   Next MD Visit 07/07/20                         [facility] Adult PT Treatment/Exercise - 06/20/20 0001      Lumbar  Exercises: Stretches   Passive Hamstring Stretch 2 reps;30 seconds    Passive Hamstring Stretch Limitations + calf with strap    ITB Stretch 2 reps;30 seconds    ITB Stretch Limitations with strap      Lumbar Exercises: Aerobic   Nustep L6 LE only 6 min      Ankle Exercises: Stretches   Gastroc Stretch 2 reps;30 seconds   standing   Gastroc Stretch Limitations gastroc and soleus    Other Stretch Knee to wall R ankle DF x15   manual correction for knee alignment to prevent valgus, verbal cues to weight bear on 1st ray as well to reduce pressure on 5th met head     Ankle Exercises: Standing   Heel Raises Both;15 reps   neutral and turn out with heels together, knees bent in neutral in partial squat to target soleus                 PT Education - 06/20/20 0903    Education Details importance of footwear (gave pt 10% discount card for [employer]), stretching and icing after walks/activity, and continuing to work on HEP for back to [employer]) Educated Patient    Methods Explanation    Comprehension Verbalized understanding            PT Short Term Goals - 06/20/20 0914      PT SHORT TERM GOAL #1   Title Patient will be independent with initial HEP.    Baseline was diligent, but stopped, edu to continue    Time 4    Period Weeks    Status On-going    Target Date 05/24/20      PT SHORT TERM GOAL #2   Title Patient will report being able to tolerate static sitting for at least 30 minutes in a chair or on her bed.    Time 4    Period Weeks    Status Achieved    Target Date 05/24/20      PT SHORT TERM GOAL #3   Title Patient will increase BLE MMT to at least 4+/5 grossly with exception of R ankle.    Time 4    Period Weeks    Status Achieved    Target Date 05/24/20      PT SHORT TERM GOAL #4  Title Patient will be able to ambulate for 30 minutes with </= 3/10 mid/low back pain pending status of R foot.    Time 4    Period Weeks    Status Achieved     Target Date 05/24/20             PT Long Term Goals - 06/20/20 0906      PT LONG TERM GOAL #1   Title Patient will be independent with advanced HEP.    Baseline Pt provided initial HEP at evaluation 04/26/2020.    Time 8    Period Weeks    Status On-going      PT LONG TERM GOAL #2   Title Patient's FOTO score will improve from 37% to 54% functional status to demonstrate improved perceived ability.    Baseline NT    Time 8    Period Weeks    Status On-going      PT LONG TERM GOAL #3   Title Patient will report being able to ride her bike for at least 40 minutes as desired pending status of R foot. Defer if limited due to RLE.    Baseline pt is currently focused more on walking, 10,000 steps a day most days    Time 8    Period Weeks    Status On-going      PT LONG TERM GOAL #4   Title Patient will be able to perform lumbar AROM WFL with </= 3/10 pain.    Time 8    Period Weeks    Status Achieved      PT LONG TERM GOAL #5   Title Pt will perform 15 heel raises with neutral ankle positioning and no pain over R lateral ankle.    Baseline discomfort    Period Weeks    Status New    Target Date 08/01/20      Additional Long Term Goals   Additional Long Term Goals Yes      PT LONG TERM GOAL #6   Title Pt will report <2/10 pain in R foot with increased activity and walking.    Time 6    Period Weeks    Status New    Target Date 08/01/20                 Plan - 06/20/20 0809    Clinical Impression Statement Pt presents with continued R foot pain and swelling after activity. She walked 10,000 steps yesterday and reports her shoes are worn out. Provided significant education on importance of footwear (gave pt 10% discount card for [employer]), stretching and icing after walks/activity, and continuing to work on HEP for back to maintain. Well tolerated session focused on hip/hamstring/calf stretching in supine and standing, as well as calf raises for gastroc and soleus  strength/endurance.    Personal Factors and Comorbidities Age;Comorbidity 3+;Fitness;Past/Current Experience;Time since onset of injury/illness/exacerbation    Comorbidities see PMH above    Examination-Activity Limitations Bend;Carry;Lift;Locomotion Level;Reach Overhead;Squat;Stairs;Stand;Sit;Sleep    Examination-Participation Restrictions Cleaning;Community Activity;Driving;Meal Prep;Shop    Stability/Clinical Decision Making Stable/Uncomplicated    Rehab Potential Fair    PT Frequency 1x / week    PT Duration 8 weeks    PT Treatment/Interventions ADLs/Self Care Home Management;Aquatic Therapy;Cryotherapy;Electrical Stimulation;Iontophoresis 4mg /ml Dexamethasone;Moist Heat;Traction;Neuromuscular re-education;Balance training;Therapeutic exercise;Therapeutic activities;Functional mobility training;Stair training;Gait training;Patient/family education;Manual techniques;Energy conservation;Dry needling;Passive range of motion;Taping    PT Next Visit Plan Hip Hinge, foot/ankle mobility, flexibility and strength; Assess respone to HEP and update PRN.  Stretching and strengthening as tolerated. Manual techniques PRN. Core strengthening.    PT Home Exercise Plan [id number] - QL stretch    Consulted and Agree with Plan of Care Patient           Patient will benefit from skilled therapeutic intervention in order to improve the following deficits and impairments:  Decreased activity tolerance,Decreased balance,Decreased mobility,Decreased strength,Postural dysfunction,Improper body mechanics,Decreased endurance,Decreased range of motion,Difficulty walking,Increased muscle spasms,Pain  Visit Diagnosis: Chronic low back pain without sciatica, unspecified back pain laterality  Pain in thoracic spine  Muscle spasm of back  Difficulty in walking, not elsewhere classified  Muscle weakness (generalized)  Pain in right ankle and joints of right foot     Problem List Patient Active Problem List    Diagnosis Date Noted  . Closed displaced fracture of fifth metatarsal bone of right foot 04/26/2020  . Fall 04/06/2020  . Idiopathic scoliosis 04/05/2020  . Hymenoptera reaction 03/11/2020  . Costochondritis 09/14/2019  . Elevated blood pressure reading 09/14/2019  . Anxiety and depression 03/20/2018  . Breast lump in female 03/20/2018  . Gastroesophageal reflux disease 03/20/2018  . Other chest pain 10/04/2016  . Hair loss 03/06/2016  . Back pain 02/08/2015  . HLD (hyperlipidemia) 10/06/2013  . Hypothyroidism 08/06/2013  . Schizoaffective disorder, unspecified type ([place]) 07/10/2013  . Diabetes mellitus without complication ([place]) 40/35/2481  . Heart murmur 07/10/2013    Lori Jones, PT, DPT 06/20/2020, 9:15 AM  [facility] [address] [place], [place], [address] Phone: [phone]   Fax:  [phone]  Name: Lori Jones MRN: [id number] Date of Birth: 03/16/1961

## 2020-06-30 ENCOUNTER — Ambulatory Visit: Payer: Medicare Other | Attending: Family Medicine

## 2020-06-30 ENCOUNTER — Other Ambulatory Visit: Payer: Self-pay

## 2020-06-30 DIAGNOSIS — M545 Low back pain, unspecified: Secondary | ICD-10-CM | POA: Diagnosis not present

## 2020-06-30 DIAGNOSIS — R262 Difficulty in walking, not elsewhere classified: Secondary | ICD-10-CM | POA: Diagnosis not present

## 2020-06-30 DIAGNOSIS — M546 Pain in thoracic spine: Secondary | ICD-10-CM

## 2020-06-30 DIAGNOSIS — M6281 Muscle weakness (generalized): Secondary | ICD-10-CM | POA: Insufficient documentation

## 2020-06-30 DIAGNOSIS — M62830 Muscle spasm of back: Secondary | ICD-10-CM | POA: Insufficient documentation

## 2020-06-30 DIAGNOSIS — G8929 Other chronic pain: Secondary | ICD-10-CM | POA: Diagnosis not present

## 2020-06-30 DIAGNOSIS — M25571 Pain in right ankle and joints of right foot: Secondary | ICD-10-CM

## 2020-06-30 NOTE — Therapy (Signed)
[place], [place], [address] Phone: [phone]   Fax:  [phone]  Physical Therapy Treatment  Patient Details  Name: Lori Jones MRN: [id number] Date of Birth: 02-20-1961 Referring Provider (PT): Kinnie Feil, MD   Encounter Date: 06/30/2020   PT End of Session - 06/30/20 1222    Visit Number 8    Number of Visits 16    Date for PT Re-Evaluation 06/24/20    Authorization Type UHC [place] 6th visit and 10th visit; PN 10th visit; KX modifier 15th visit    PT Start Time 0850   pt arrive a little late   PT Stop Time 0930    PT Time Calculation (min) 40 min    Activity Tolerance Patient tolerated treatment well    Behavior During Therapy [facility] for tasks assessed/performed           Past Medical History:  Diagnosis Date  . Allergy   . Anemia   . Anginal pain (HCC)    hx of CPs  . Anxiety   . Arthritis   . Asthma    hx of in childhood   . Back pain, chronic   . Bursitis   . Depression   . Diabetes mellitus   . Foot fracture, right   . Heart murmur   . Herniated disc   . Hypercholesteremia   . Hypertension    no meds  . Hypothyroid   . Kidney disease    "pelvic kidney" bilat   . Scoliosis   . Seizures ([place])    hx of in childhood and again in college   . Stroke [facility])    hx of sun stroke while in college     Past Surgical History:  Procedure Laterality Date  . ABDOMINAL HYSTERECTOMY    . CESAREAN SECTION    . CHOLECYSTECTOMY N/A 09/08/2014   Procedure: LAPAROSCOPIC CHOLECYSTECTOMY;  Surgeon: Coralie Keens, MD;  [facility];  Service: General;  Laterality: N/A;  . HAND SURGERY     dog bite  . INCISIONAL HERNIA REPAIR N/A 04/24/2016   Procedure: HERNIA REPAIR INCISIONAL;  Surgeon: Coralie Keens, MD;  Location: [place];  Service: General;  Laterality: N/A;  . INSERTION OF MESH N/A 04/24/2016   Procedure: INSERTION OF MESH;  Surgeon: Coralie Keens, MD;  Location: [place];  Service: General;  Laterality: N/A;  . Thumb surgery    . VENTRAL HERNIA REPAIR N/A 09/08/2014   Procedure: VENTRAL HERNIA REPAIR;  Surgeon: Coralie Keens, MD;  [facility];  Service: General;  Laterality: N/A;    There were no vitals filed for this visit.   Subjective Assessment - 06/30/20 0856    Subjective Pt reports her foot "is fine and back is alright". "I can feel 'em with the rain though, mostly my back. I was trying to walk a bit in the rain yesterday, and her chest started hurting when she got to 104 pulse rate, but also had stress from a threatening phone call. Her doctor has told her not to get upset before to avoid chest pain.."    Pertinent History See PMH above. R 5th metatarsal fracture 04/06/2020    Limitations Lifting;Standing;Walking;House hold activities;Sitting   typically sits in recliner; has to have pillows across back (4 pillows) if sitting on her bed for comfort   How long can you sit comfortably? "Probably 1-2 hours"    How long can you stand comfortably? "About 1-2  hours" prior to R 5th metatarsal fx    How long can you walk comfortably? "About 1-2 hours" prior to R 5th metatarsal fx    Diagnostic tests Lumbar: Sclerosis at the L3 level may reflect degenerative related changes however cannot exclude subacute, nondisplaced endplate deformities. Minimal depression of the superior T12 endplate with underlying sclerosis may reflect age indeterminate deformity versus degenerative changes. 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    Patient Stated Goals "I want to be able to walk everyday, and I want to be able to ride my bike for at least 40 minutes"    Currently in Pain? No/denies    Pain Score 0-No pain    Pain Location Back    Pain Onset More than a month ago    Pain Score 0    Pain Location Foot                             [facility] Adult PT Treatment/Exercise - 06/30/20 0001       Lumbar Exercises: Standing   Other Standing Lumbar Exercises Paloff Press with Free Motion 10# walkouts to L, 7# to R, 7# press L side out (unable to peform with opposite side d/t L-sided back pain)      Lumbar Exercises: Supine   Bridge with [employer] 15 reps    Bridge with [employer] Limitations verbal cues for core activation and downward pressure through [employer] with March 10 reps    Bridge with [employer] Limitations low bridge hold + slow march      Lumbar Exercises: Sidelying   Hip Abduction Limitations x12 B + ext (VCs/TCs for hip stability and extension with abduction)      Ankle Exercises: Standing   SLS 2x30"    Heel Raises --   up 2 down 1 8x B                   PT Short Term Goals - 06/20/20 0914      PT SHORT TERM GOAL #1   Title Patient will be independent with initial HEP.    Baseline was diligent, but stopped, edu to continue    Time 4    Period Weeks    Status On-going    Target Date 05/24/20      PT SHORT TERM GOAL #2   Title Patient will report being able to tolerate static sitting for at least 30 minutes in a chair or on her bed.    Time 4    Period Weeks    Status Achieved    Target Date 05/24/20      PT SHORT TERM GOAL #3   Title Patient will increase BLE MMT to at least 4+/5 grossly with exception of R ankle.    Time 4    Period Weeks    Status Achieved    Target Date 05/24/20      PT SHORT TERM GOAL #4   Title Patient will be able to ambulate for 30 minutes with </= 3/10 mid/low back pain pending status of R foot.    Time 4    Period Weeks    Status Achieved    Target Date 05/24/20             PT Long Term Goals - 06/20/20 0906      PT LONG TERM GOAL #1  Title Patient will be independent with advanced HEP.    Baseline Pt provided initial HEP at evaluation 04/26/2020.    Time 8    Period Weeks    Status On-going      PT LONG TERM GOAL #2   Title Patient's FOTO score will improve from 37% to 54% functional  status to demonstrate improved perceived ability.    Baseline NT    Time 8    Period Weeks    Status On-going      PT LONG TERM GOAL #3   Title Patient will report being able to ride her bike for at least 40 minutes as desired pending status of R foot. Defer if limited due to RLE.    Baseline pt is currently focused more on walking, 10,000 steps a day most days    Time 8    Period Weeks    Status On-going      PT LONG TERM GOAL #4   Title Patient will be able to perform lumbar AROM WFL with </= 3/10 pain.    Time 8    Period Weeks    Status Achieved      PT LONG TERM GOAL #5   Title Pt will perform 15 heel raises with neutral ankle positioning and no pain over R lateral ankle.    Baseline discomfort    Period Weeks    Status New    Target Date 08/01/20      Additional Long Term Goals   Additional Long Term Goals Yes      PT LONG TERM GOAL #6   Title Pt will report <2/10 pain in R foot with increased activity and walking.    Time 6    Period Weeks    Status New    Target Date 08/01/20                 Plan - 06/30/20 1041    Clinical Impression Statement Pt continues to have left-sided weakness in trunk and LE, reporting back pain with walkouts to R + paloff press - able to perform just walk outs with handle close to chest. She was able to progress bridge to march, as well as some SLS work. Despite 5th met head fracture on R foot, pt is weaker in L LE with eccentric calf raises.    Personal Factors and Comorbidities Age;Comorbidity 3+;Fitness;Past/Current Experience;Time since onset of injury/illness/exacerbation    Comorbidities see PMH above    Examination-Activity Limitations Bend;Carry;Lift;Locomotion Level;Reach Overhead;Squat;Stairs;Stand;Sit;Sleep    Examination-Participation Restrictions Cleaning;Community Activity;Driving;Meal Prep;Shop    Stability/Clinical Decision Making Stable/Uncomplicated    Rehab Potential Fair    PT Frequency 1x / week    PT  Duration 8 weeks    PT Treatment/Interventions ADLs/Self Care Home Management;Aquatic Therapy;Cryotherapy;Electrical Stimulation;Iontophoresis 6m/ml Dexamethasone;Moist Heat;Traction;Neuromuscular re-education;Balance training;Therapeutic exercise;Therapeutic activities;Functional mobility training;Stair training;Gait training;Patient/family education;Manual techniques;Energy conservation;Dry needling;Passive range of motion;Taping    PT Next Visit Plan Hip Hinge, foot/ankle mobility, flexibility and strength; Assess respone to HEP and update PRN. Stretching and strengthening as tolerated. Manual techniques PRN. Core strengthening.    PT Home Exercise Plan [id number] - QL stretch    Consulted and Agree with Plan of Care Patient           Patient will benefit from skilled therapeutic intervention in order to improve the following deficits and impairments:  Decreased activity tolerance,Decreased balance,Decreased mobility,Decreased strength,Postural dysfunction,Improper body mechanics,Decreased endurance,Decreased range of motion,Difficulty walking,Increased muscle spasms,Pain  Visit Diagnosis: Chronic low back pain without  sciatica, unspecified back pain laterality  Pain in thoracic spine  Muscle spasm of back  Difficulty in walking, not elsewhere classified  Muscle weakness (generalized)  Pain in right ankle and joints of right foot     Problem List Patient Active Problem List   Diagnosis Date Noted  . Closed displaced fracture of fifth metatarsal bone of right foot 04/26/2020  . Fall 04/06/2020  . Idiopathic scoliosis 04/05/2020  . Hymenoptera reaction 03/11/2020  . Costochondritis 09/14/2019  . Elevated blood pressure reading 09/14/2019  . Anxiety and depression 03/20/2018  . Breast lump in female 03/20/2018  . Gastroesophageal reflux disease 03/20/2018  . Other chest pain 10/04/2016  . Hair loss 03/06/2016  . Back pain 02/08/2015  . HLD (hyperlipidemia) 10/06/2013  .  Hypothyroidism 08/06/2013  . Schizoaffective disorder, unspecified type ([place]) 07/10/2013  . Diabetes mellitus without complication ([place]) 57/90/3833  . Heart murmur 07/10/2013    Izell Mustang, PT, DPT 06/30/2020, 12:22 PM  [facility] [address] [place], [place], [address] Phone: [phone]   Fax:  [phone]  Name: Lori Jones MRN: [id number] Date of Birth: [birth year]

## 2020-07-04 ENCOUNTER — Other Ambulatory Visit: Payer: Self-pay

## 2020-07-04 ENCOUNTER — Ambulatory Visit: Payer: Medicare Other

## 2020-07-04 DIAGNOSIS — G8929 Other chronic pain: Secondary | ICD-10-CM | POA: Diagnosis not present

## 2020-07-04 DIAGNOSIS — M62830 Muscle spasm of back: Secondary | ICD-10-CM | POA: Diagnosis not present

## 2020-07-04 DIAGNOSIS — M546 Pain in thoracic spine: Secondary | ICD-10-CM

## 2020-07-04 DIAGNOSIS — M6281 Muscle weakness (generalized): Secondary | ICD-10-CM | POA: Diagnosis not present

## 2020-07-04 DIAGNOSIS — R262 Difficulty in walking, not elsewhere classified: Secondary | ICD-10-CM | POA: Diagnosis not present

## 2020-07-04 DIAGNOSIS — M545 Low back pain, unspecified: Secondary | ICD-10-CM

## 2020-07-04 DIAGNOSIS — M25571 Pain in right ankle and joints of right foot: Secondary | ICD-10-CM

## 2020-07-04 NOTE — Patient Instructions (Signed)
Access Code: [email or web address] URL: [email or web address] Date: 07/04/2020 Prepared by: Kathreen Cornfield  Exercises Standing Quadratus Lumborum Stretch with Doorway - 1 x daily - 7 x weekly - 3 sets - 4 reps - 30s hold Seated Quadratus Lumborum Stretch with Arm Overhead - 2 x daily - 7 x weekly - 3 sets - 10-30 seconds hold Supine Lower Trunk Rotation - 2 x daily - 7 x weekly - 1 sets - 10 reps - 3-5 seconds hold Open Books - 2 x daily - 7 x weekly - 1-2 sets - 10 reps Supine Posterior Pelvic Tilt - 2 x daily - 7 x weekly - 2 sets - 10 reps - 3-5 seconds hold Seated March with Resistance - 3-4 x weekly - 1-2 sets - 10 reps Sit to Stand with Resistance Around Legs - 3-4 x weekly - 1-2 sets - 10 reps Gastroc Stretch on Wall - 1-2 x daily - 7 x weekly - 2-3 sets - 30 seconds hold Soleus Stretch on Wall - 1-2 x daily - 7 x weekly - 2-3 sets - 30 seconds hold Single Leg Stance - 1 x daily - 7 x weekly - 2 sets - 30 seconds hold

## 2020-07-04 NOTE — Progress Notes (Signed)
    SUBJECTIVE:   CHIEF COMPLAINT / HPI:   T2DM Current regimen: Metformin 1000 mg twice daily CBGs doesn't check Last A1c 04/05/2020, 6.8 Last BMP on 06/14/2020, WNL On Lipitor 40 mg, last lipid panel 09/14/2019, LDL 71 Not on ACE or ARB, no diagnosis of hypertension She had moderately increased urine albumin to creatinine ratio on 06/14/2020 Has had an eye exam, up to date   Pneumonia vaccine: due, declines Pap smear is UTD at gyn  PERTINENT  PMH / PSH: GERD, T2DM, hypothyroidism, schizoaffective disorder, HLD, anxiety and depression  OBJECTIVE:   BP 115/65   Pulse 63   Ht 5' 8.5" (1.74 m)   Wt 183 lb (83 kg)   SpO2 100%   BMI 27.42 kg/m    Physical Exam:  General: 60 y.o. female in NAD Cardio: RRR no m/r/g Lungs: CTAB, no wheezing, no rhonchi, no crackles, no IWOB on RA Skin: warm and dry Extremities: No edema  Results for orders placed or performed in visit on 07/05/20 (from the past 24 hour(s))  HgB A1c     Status: None   Collection Time: 07/05/20  9:07 AM  Result Value Ref Range   Hemoglobin A1C     HbA1c POC (<> result, manual entry)     HbA1c, POC (prediabetic range)     HbA1c, POC (controlled diabetic range) 6.8 0.0 - 7.0 %     ASSESSMENT/PLAN:   Diabetes mellitus without complication (HCC) V7O remains well controlled.  Patient was congratulated on this.  She had a BMP last month it was within normal limits.  Did discuss that she has moderately increased urine albumin to creatinine ratio on last check 3 months ago and could consider ACE/ARB therapy in the future.  She would like to hold off on this at this time.  We will plan to recheck this and do a send out lab for more accurate reading at her next visit in June.  We will also perform a lipid panel in June.  She is currently on Lipitor 40 mg daily and LDL is at goal.  She is up-to-date on eye exam.  Did discuss pneumonia vaccine, but patient declines today.   Repeat lipid panel and perform send out urine  albumin/creatinine ratio in June.  Cleophas Dunker, [place]

## 2020-07-04 NOTE — Therapy (Signed)
[place], [place], [address] Phone: [phone]   Fax:  [phone]  Physical Therapy Treatment/Discharge  Patient Details  Name: Lori Jones MRN: [id number] Date of Birth: 10-04-1960 Referring Provider (PT): Kinnie Feil, MD   Encounter Date: 07/04/2020   PT End of Session - 07/04/20 1114    Visit Number 9    Number of Visits 16    Date for PT Re-Evaluation 06/24/20    Authorization Type [employer] [place] 6th visit and 10th visit; PN 10th visit; KX modifier 15th visit    PT Start Time 1106   pt arrived late   PT Stop Time 1145    PT Time Calculation (min) 39 min    Activity Tolerance Patient tolerated treatment well    Behavior During Therapy [facility] for tasks assessed/performed           Past Medical History:  Diagnosis Date  . Allergy   . Anemia   . Anginal pain (HCC)    hx of CPs  . Anxiety   . Arthritis   . Asthma    hx of in childhood   . Back pain, chronic   . Bursitis   . Depression   . Diabetes mellitus   . Foot fracture, right   . Heart murmur   . Herniated disc   . Hypercholesteremia   . Hypertension    no meds  . Hypothyroid   . Kidney disease    "pelvic kidney" bilat   . Scoliosis   . Seizures ([place])    hx of in childhood and again in college   . Stroke [facility])    hx of sun stroke while in college     Past Surgical History:  Procedure Laterality Date  . ABDOMINAL HYSTERECTOMY    . CESAREAN SECTION    . CHOLECYSTECTOMY N/A 09/08/2014   Procedure: LAPAROSCOPIC CHOLECYSTECTOMY;  Surgeon: Coralie Keens, MD;  Location: [facility];  Service: General;  Laterality: N/A;  . HAND SURGERY     dog bite  . INCISIONAL HERNIA REPAIR N/A 04/24/2016   Procedure: HERNIA REPAIR INCISIONAL;  Surgeon: Coralie Keens, MD;  Location: [place];  Service: General;  Laterality: N/A;  . INSERTION OF MESH N/A 04/24/2016   Procedure: INSERTION OF MESH;  Surgeon: Coralie Keens, MD;  Location: [place];  Service: General;  Laterality: N/A;  . Thumb surgery    . VENTRAL HERNIA REPAIR N/A 09/08/2014   Procedure: VENTRAL HERNIA REPAIR;  Surgeon: Coralie Keens, MD;  [facility];  Service: General;  Laterality: N/A;    There were no vitals filed for this visit.   Subjective Assessment - 07/04/20 1114    Subjective "I'm proud of myself. I've come a long way. You've really helped me". Pt is thinking she may ready for DC today. She sees her PCP tomorrow and foot doctor this week.    Pertinent History See PMH above. R 5th metatarsal fracture 04/06/2020    Limitations Lifting;Standing;Walking;House hold activities;Sitting   typically sits in recliner; has to have pillows across back (4 pillows) if sitting on her bed for comfort   How long can you sit comfortably? "Probably 1-2 hours"    How long can you stand comfortably? "About 1-2 hours" prior to R 5th metatarsal fx    How long can you walk comfortably? "About 1-2 hours" prior to R 5th metatarsal fx    Diagnostic tests Lumbar: Sclerosis at the L3 level may  reflect degenerative related changes however cannot exclude subacute, nondisplaced endplate deformities. Minimal depression of the superior T12 endplate with underlying sclerosis may reflect age indeterminate deformity versus degenerative changes. 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    Patient Stated Goals "I want to be able to walk everyday, and I want to be able to ride my bike for at least 40 minutes"    Currently in Pain? No/denies    Pain Onset More than a month ago    Pain Score 0              [facility] PT Assessment - 07/04/20 0001      Observation/Other Assessments   Focus on Therapeutic Outcomes (FOTO)  67% ability (37% on intake)      AROM   Overall AROM Comments WFL, no pain in ankle    Lumbar Flexion 1 inch from malleoli with fingertips    Lumbar Extension WFL and feels good/relieving    Lumbar - Right Side  Bend 1 inch past lateral knee joint line/patella    Lumbar - Left Side Bend 2 inches past lateral knee joint line/patella    Lumbar - Right Rotation WFL    Lumbar - Left Rotation WFL      Strength   Overall Strength Comments ankle all 5/5                         [facility] Adult PT Treatment/Exercise - 07/04/20 0001      Lumbar Exercises: Standing   Functional Squats 15 reps    Functional Squats Limitations at sink      Ankle Exercises: Standing   SLS 2x30"    Heel Raises Both;15 reps;Right;10 reps      Ankle Exercises: Stretches   Gastroc Stretch 2 reps;30 seconds   standing   Gastroc Stretch Limitations gastroc and soleus                  PT Education - 07/04/20 1131    Education Details HEP, DC and edu to go back to doctor if need anything else or new Rx    Person(s) Educated Patient    Methods Explanation;Demonstration    Comprehension Verbalized understanding;Returned demonstration            PT Short Term Goals - 07/04/20 1133      PT SHORT TERM GOAL #1   Title Patient will be independent with initial HEP.    Baseline was diligent, but stopped, edu to continue    Time 4    Period Weeks    Status Achieved    Target Date 05/24/20      PT SHORT TERM GOAL #2   Title Patient will report being able to tolerate static sitting for at least 30 minutes in a chair or on her bed.    Time 4    Period Weeks    Status Achieved    Target Date 05/24/20      PT SHORT TERM GOAL #3   Title Patient will increase BLE MMT to at least 4+/5 grossly with exception of R ankle.    Time 4    Period Weeks    Status Achieved    Target Date 05/24/20      PT SHORT TERM GOAL #4   Title Patient will be able to ambulate for 30 minutes with </= 3/10 mid/low back pain pending status of R foot.  Time 4    Period Weeks    Status Achieved    Target Date 05/24/20             PT Long Term Goals - 07/04/20 1133      PT LONG TERM GOAL #1   Title Patient will be  independent with advanced HEP.    Time 8    Period Weeks    Status Achieved      PT LONG TERM GOAL #2   Title Patient's FOTO score will improve from 37% to 54% functional status to demonstrate improved perceived ability.    Baseline 67% ability    Time 8    Period Weeks    Status Achieved      PT LONG TERM GOAL #3   Title Patient will report being able to ride her bike for at least 40 minutes as desired pending status of R foot. Defer if limited due to RLE.    Baseline pt is currently focused more on walking, 10,000 steps a day most days; not biking yet    Time 8    Period Weeks    Status Not Met      PT LONG TERM GOAL #4   Title Patient will be able to perform lumbar AROM WFL with </= 3/10 pain.    Time 8    Period Weeks    Status Achieved      PT LONG TERM GOAL #5   Title Pt will perform 15 heel raises with neutral ankle positioning and no pain over R lateral ankle.    Baseline able to do 2x15 without pain    Period Weeks    Status Achieved      PT LONG TERM GOAL #6   Title Pt will report <2/10 pain in R foot with increased activity and walking.    Baseline still having some swelling and maybe a 2.5-3/10 pain    Time 6    Period Weeks    Status Partially Met                 Plan - 07/04/20 1115    Clinical Impression Statement Adelaide has made fantastic progress, meeting nearly all of her goals (partially met last LTG) and not complaining of R foot or low back pain. She has progressed all AROM to [facility] and increased MMT in LE to 5/5 R LE 4+/5 L LE. She feels ready to discharge and agreeable to it today with HEP. Educated pt to continue to perform HEP, as needed and not stop all at once. Additionally to go back to PCP/referring provider if she feels the need to return to PT.    Personal Factors and Comorbidities Age;Comorbidity 3+;Fitness;Past/Current Experience;Time since onset of injury/illness/exacerbation    Comorbidities see PMH above    Examination-Activity  Limitations Bend;Carry;Lift;Locomotion Level;Reach Overhead;Squat;Stairs;Stand;Sit;Sleep    Examination-Participation Restrictions Cleaning;Community Activity;Driving;Meal Prep;Shop    Stability/Clinical Decision Making Stable/Uncomplicated    Rehab Potential Fair    PT Frequency 1x / week    PT Duration 8 weeks    PT Treatment/Interventions ADLs/Self Care Home Management;Aquatic Therapy;Cryotherapy;Electrical Stimulation;Iontophoresis 4mg /ml Dexamethasone;Moist Heat;Traction;Neuromuscular re-education;Balance training;Therapeutic exercise;Therapeutic activities;Functional mobility training;Stair training;Gait training;Patient/family education;Manual techniques;Energy conservation;Dry needling;Passive range of motion;Taping    PT Next Visit Plan DC with HEP    PT Home Exercise Plan [id number] - QL stretch    Consulted and Agree with Plan of Care Patient           Patient will benefit from skilled  therapeutic intervention in order to improve the following deficits and impairments:  Decreased activity tolerance,Decreased balance,Decreased mobility,Decreased strength,Postural dysfunction,Improper body mechanics,Decreased endurance,Decreased range of motion,Difficulty walking,Increased muscle spasms,Pain  Visit Diagnosis: Chronic low back pain without sciatica, unspecified back pain laterality  Pain in thoracic spine  Muscle spasm of back  Difficulty in walking, not elsewhere classified  Muscle weakness (generalized)  Pain in right ankle and joints of right foot     Problem List Patient Active Problem List   Diagnosis Date Noted  . Closed displaced fracture of fifth metatarsal bone of right foot 04/26/2020  . Fall 04/06/2020  . Idiopathic scoliosis 04/05/2020  . Hymenoptera reaction 03/11/2020  . Costochondritis 09/14/2019  . Elevated blood pressure reading 09/14/2019  . Anxiety and depression 03/20/2018  . Breast lump in female 03/20/2018  . Gastroesophageal reflux disease  03/20/2018  . Other chest pain 10/04/2016  . Hair loss 03/06/2016  . Back pain 02/08/2015  . HLD (hyperlipidemia) 10/06/2013  . Hypothyroidism 08/06/2013  . Schizoaffective disorder, unspecified type ([place]) 07/10/2013  . Diabetes mellitus without complication ([place]) 37/94/3276  . Heart murmur 07/10/2013    Izell Lastrup, PT, DPT 07/04/2020, 12:32 PM  [place] [facility] [address] [place], [place], [address] Phone: [phone]   Fax:  [phone]  Name: CARETHA RUMBAUGH MRN: [id number] Date of Birth: [birth year]  PHYSICAL THERAPY DISCHARGE SUMMARY  Visits from Start of Care: 9  Current functional level related to goals / functional outcomes: Returning to all former activities, plans to ride back again (reviewed appropriate bike height and fit)   Remaining deficits: Some R foot swelling with increased activity and pain to 2.5-3/10   Education / Equipment: HEP, theraband  Plan: Patient agrees to discharge.  Patient goals were partially met. Patient is being discharged due to being pleased with the current functional level.  ?????

## 2020-07-05 ENCOUNTER — Other Ambulatory Visit: Payer: Self-pay

## 2020-07-05 ENCOUNTER — Ambulatory Visit (INDEPENDENT_AMBULATORY_CARE_PROVIDER_SITE_OTHER): Payer: Medicare Other | Admitting: Family Medicine

## 2020-07-05 VITALS — BP 115/65 | HR 63 | Ht 68.5 in | Wt 183.0 lb

## 2020-07-05 DIAGNOSIS — E119 Type 2 diabetes mellitus without complications: Secondary | ICD-10-CM

## 2020-07-05 LAB — POCT GLYCOSYLATED HEMOGLOBIN (HGB A1C): HbA1c, POC (controlled diabetic range): 6.8 % (ref 0.0–7.0)

## 2020-07-05 NOTE — Patient Instructions (Signed)
Thank you for coming to see me today. It was a pleasure. Today we talked about:   Your A1c is great!  No changes today!  Please follow-up with me in June, we will check your cholesterol then and repeat your urine to see if we need to start the medication to protect your kidneys!  If you have any questions or concerns, please do not hesitate to call the office at [phone].  Best,   Arizona Constable, DO

## 2020-07-05 NOTE — Assessment & Plan Note (Signed)
A1c remains well controlled.  Patient was congratulated on this.  She had a BMP last month it was within normal limits.  Did discuss that she has moderately increased urine albumin to creatinine ratio on last check 3 months ago and could consider ACE/ARB therapy in the future.  She would like to hold off on this at this time.  We will plan to recheck this and do a send out lab for more accurate reading at her next visit in June.  We will also perform a lipid panel in June.  She is currently on Lipitor 40 mg daily and LDL is at goal.  She is up-to-date on eye exam.  Did discuss pneumonia vaccine, but patient declines today.

## 2020-07-07 DIAGNOSIS — S92351A Displaced fracture of fifth metatarsal bone, right foot, initial encounter for closed fracture: Secondary | ICD-10-CM | POA: Diagnosis not present

## 2020-07-10 ENCOUNTER — Other Ambulatory Visit: Payer: Self-pay | Admitting: *Deleted

## 2020-07-10 DIAGNOSIS — E089 Diabetes mellitus due to underlying condition without complications: Secondary | ICD-10-CM

## 2020-07-10 DIAGNOSIS — F32A Depression, unspecified: Secondary | ICD-10-CM

## 2020-07-10 DIAGNOSIS — E785 Hyperlipidemia, unspecified: Secondary | ICD-10-CM

## 2020-07-10 MED ORDER — ATORVASTATIN CALCIUM 40 MG PO TABS
40.0000 mg | ORAL_TABLET | Freq: Every day | ORAL | 3 refills | Status: DC
Start: 2020-07-10 — End: 2021-07-30

## 2020-07-10 MED ORDER — ESCITALOPRAM OXALATE 10 MG PO TABS: 10.0000 mg | ORAL_TABLET | Freq: Every day | ORAL | 3 refills | Status: DC

## 2020-08-03 ENCOUNTER — Emergency Department (HOSPITAL_COMMUNITY)
Admission: EM | Admit: 2020-08-03 | Discharge: 2020-08-03 | Disposition: A | Discharge status: Home / Self Care | Payer: Medicare Other | Attending: Emergency Medicine | Admitting: Emergency Medicine

## 2020-08-03 ENCOUNTER — Emergency Department (HOSPITAL_COMMUNITY): Payer: Medicare Other

## 2020-08-03 ENCOUNTER — Other Ambulatory Visit: Payer: Self-pay

## 2020-08-03 DIAGNOSIS — Z7982 Long term (current) use of aspirin: Secondary | ICD-10-CM | POA: Insufficient documentation

## 2020-08-03 DIAGNOSIS — I10 Essential (primary) hypertension: Secondary | ICD-10-CM | POA: Insufficient documentation

## 2020-08-03 DIAGNOSIS — J45909 Unspecified asthma, uncomplicated: Secondary | ICD-10-CM | POA: Diagnosis not present

## 2020-08-03 DIAGNOSIS — J9811 Atelectasis: Secondary | ICD-10-CM | POA: Diagnosis not present

## 2020-08-03 DIAGNOSIS — E119 Type 2 diabetes mellitus without complications: Secondary | ICD-10-CM | POA: Diagnosis not present

## 2020-08-03 DIAGNOSIS — R079 Chest pain, unspecified: Secondary | ICD-10-CM | POA: Diagnosis not present

## 2020-08-03 DIAGNOSIS — R0789 Other chest pain: Secondary | ICD-10-CM | POA: Diagnosis not present

## 2020-08-03 DIAGNOSIS — Z7984 Long term (current) use of oral hypoglycemic drugs: Secondary | ICD-10-CM | POA: Diagnosis not present

## 2020-08-03 DIAGNOSIS — Z79899 Other long term (current) drug therapy: Secondary | ICD-10-CM | POA: Diagnosis not present

## 2020-08-03 DIAGNOSIS — E039 Hypothyroidism, unspecified: Secondary | ICD-10-CM | POA: Insufficient documentation

## 2020-08-03 LAB — D-DIMER, QUANTITATIVE: D-Dimer, Quant: 1.77 ug/mL-FEU — ABNORMAL HIGH (ref 0.00–0.50)

## 2020-08-03 LAB — BASIC METABOLIC PANEL
Anion gap: 8 (ref 5–15)
BUN: 12 mg/dL (ref 6–20)
CO2: 24 mmol/L (ref 22–32)
Calcium: 9.5 mg/dL (ref 8.9–10.3)
Chloride: 106 mmol/L (ref 98–111)
Creatinine, Ser: 0.8 mg/dL (ref 0.44–1.00)
GFR, Estimated: >60 mL/min (ref >60)
Glucose, Bld: 166 mg/dL — ABNORMAL HIGH (ref 70–99)
Potassium: 4 mmol/L (ref 3.5–5.1)
Sodium: 138 mmol/L (ref 135–145)

## 2020-08-03 LAB — CBC
HCT: 38.5 % (ref 36.0–46.0)
Hemoglobin: 12.1 g/dL (ref 12.0–15.0)
MCH: 26.7 pg (ref 26.0–34.0)
MCHC: 31.4 g/dL (ref 30.0–36.0)
MCV: 85 fL (ref 80.0–100.0)
Platelets: 237 10*3/uL (ref 150–400)
RBC: 4.53 MIL/uL (ref 3.87–5.11)
RDW: 12.7 % (ref 11.5–15.5)
WBC: 4 10*3/uL (ref 4.0–10.5)
nRBC: 0 % (ref 0.0–0.2)

## 2020-08-03 LAB — TROPONIN I (HIGH SENSITIVITY): Troponin I (High Sensitivity): 3 ng/L (ref <18)

## 2020-08-03 MED ORDER — IOHEXOL 350 MG/ML SOLN
80.0000 mL | Freq: Once | INTRAVENOUS | Status: AC | PRN
  Administered 2020-08-03: 80 mL via INTRAVENOUS

## 2020-08-03 NOTE — ED Triage Notes (Signed)
Pt reports mild chest pain starting last night, worse this morning. Pain worse with deep breaths, otherwise pain is constant. 324 ASA and 2 nitro given by EMS with improvement in pain.

## 2020-08-03 NOTE — ED Notes (Signed)
Pt verbalizes understanding of d/c instructions. Pt ambulatory at d/c with all belongings.   

## 2020-08-03 NOTE — ED Provider Notes (Signed)
Awaiting D-dimer.  If D-dimer negative patient is stable for.  Does not need second troponin. Physical Exam  BP (!) 150/88   Pulse 62   Temp 98.5 F (36.9 C) (Oral)   Resp (!) 29   Ht 5' 8.5" (1.74 m)   Wt 79.4 kg   SpO2 99%   BMI 26.22 kg/m   Physical Exam  ED Course/Procedures     Procedures  MDM   19: 27 waiting for CT report.  I have called CT they will be checking to see if there are any problems with transmission to radiology.      Charlesetta Shanks, MD 08/03/20 [phone]

## 2020-08-03 NOTE — ED Provider Notes (Signed)
[place] EMERGENCY DEPARTMENT Provider Note   CSN: [id number] Arrival date & time: 08/03/20  1006     History Chief Complaint  Patient presents with  . Chest Pain    Lori Jones is a 60 y.o. female.  HPI Patient presents with chest pain.  Sharp in her left upper chest.  Comes and goes.  Does have a penlight component that is different than the pain she normally gets.  States has had pains like this before but this does feel somewhat different.  Denies swelling in her legs but states she has had recent ankle injury.States she was less mobile because of it.  That finished up around 4 months ago however.  Not exertional pain.  Feeling somewhat better now.    Past Medical History:  Diagnosis Date  . Allergy   . Anemia   . Anginal pain (HCC)    hx of CPs  . Anxiety   . Arthritis   . Asthma    hx of in childhood   . Back pain, chronic   . Bursitis   . Depression   . Diabetes mellitus   . Foot fracture, right   . Heart murmur   . Herniated disc   . Hypercholesteremia   . Hypertension    no meds  . Hypothyroid   . Kidney disease    "pelvic kidney" bilat   . Scoliosis   . Seizures ([place])    hx of in childhood and again in college   . Stroke [facility])    hx of sun stroke while in college     Patient Active Problem List   Diagnosis Date Noted  . Anxiety and depression 03/20/2018  . Gastroesophageal reflux disease 03/20/2018  . Hair loss 03/06/2016  . HLD (hyperlipidemia) 10/06/2013  . Hypothyroidism 08/06/2013  . Schizoaffective disorder, unspecified type ([place]) 07/10/2013  . Diabetes mellitus without complication ([place]) 66/44/0347  . Heart murmur 07/10/2013    Past Surgical History:  Procedure Laterality Date  . ABDOMINAL HYSTERECTOMY    . CESAREAN SECTION    . CHOLECYSTECTOMY N/A 09/08/2014   Procedure: LAPAROSCOPIC CHOLECYSTECTOMY;  Surgeon: Coralie Keens, MD;  [facility];  Service: General;  Laterality: N/A;  . HAND SURGERY     dog  bite  . INCISIONAL HERNIA REPAIR N/A 04/24/2016   Procedure: HERNIA REPAIR INCISIONAL;  Surgeon: Coralie Keens, MD;  Location: [place];  Service: General;  Laterality: N/A;  . INSERTION OF MESH N/A 04/24/2016   Procedure: INSERTION OF MESH;  Surgeon: Coralie Keens, MD;  Location: [place];  Service: General;  Laterality: N/A;  . Thumb surgery    . VENTRAL HERNIA REPAIR N/A 09/08/2014   Procedure: VENTRAL HERNIA REPAIR;  Surgeon: Coralie Keens, MD;  [facility];  Service: General;  Laterality: N/A;     OB History   No obstetric history on file.     Family History  Problem Relation Age of Onset  . Cancer Mother   . Depression Mother   . Diabetes Mother   . Heart disease Mother 84       Stents  . Hypertension Mother   . Hyperlipidemia Mother   . Thyroid disease Mother   . Cancer Father   . Sickle cell anemia Brother   . Diabetes Sister     Social History   Tobacco Use  . Smoking status: Never Smoker  . Smokeless tobacco: Never Used  Vaping Use  . Vaping Use: Never used  Substance  Use Topics  . Alcohol use: No    Alcohol/week: 0.0 standard drinks  . Drug use: No    Home Medications Prior to Admission medications   Medication Sig Start Date End Date Taking? Authorizing Provider  amoxicillin-clavulanate (AUGMENTIN) 875-125 MG tablet Take 1 tablet by mouth every 12 (twelve) hours. 05/05/20   Zigmund Gottron, NP  aspirin EC 81 MG tablet Take 1 tablet (81 mg total) by mouth daily. 05/09/17   Carlyle Dolly, MD  atorvastatin (LIPITOR) 40 MG tablet Take 1 tablet (40 mg total) by mouth daily. 07/10/20   Patriciaann Clan, DO  diphenhydrAMINE-zinc acetate (BENADRYL EXTRA STRENGTH) cream Apply 1 application topically 3 (three) times daily as needed for itching. 03/07/20   Carollee Leitz, MD  escitalopram (LEXAPRO) 10 MG tablet Take 1 tablet (10 mg total) by mouth daily. 07/10/20   Patriciaann Clan, DO  famotidine (PEPCID) 20 MG tablet Take 1 tablet (20 mg total) by mouth 2  (two) times daily. 03/07/20   Carollee Leitz, MD  ibuprofen (IBU) 800 MG tablet Take 1 tablet (800 mg total) by mouth 2 (two) times daily as needed for moderate pain. 05/05/20   Zigmund Gottron, NP  levothyroxine (SYNTHROID) 25 MCG tablet TAKE 1 TABLET(25 MCG) BY MOUTH DAILY BEFORE BREAKFAST 12/02/19   Meccariello, Bernita Raisin, DO  meclizine (ANTIVERT) 12.5 MG tablet Take 1 tablet (12.5 mg total) by mouth 3 (three) times daily as needed for dizziness. 04/30/18   Zenia Resides, MD  metFORMIN (GLUCOPHAGE) 1000 MG tablet TAKE 1 TABLET BY MOUTH TWICE DAILY WITH A MEAL 03/16/20   Meccariello, Bernita Raisin, DO  ondansetron (ZOFRAN ODT) 4 MG disintegrating tablet Take 1 tablet (4 mg total) by mouth every 8 (eight) hours as needed for nausea or vomiting. 04/30/18   Ward, Ozella Almond, PA-C    Allergies    Clarithromycin, Sumatriptan, Canagliflozin, Canagliflozin-metformin hcl, Chlorhexidine, and Sumatriptan succinate  Review of Systems   Review of Systems  Physical Exam Updated Vital Signs BP (!) 150/88   Pulse 62   Temp 98.5 F (36.9 C) (Oral)   Resp (!) 29   Ht 5' 8.5" (1.74 m)   Wt 79.4 kg   SpO2 99%   BMI 26.22 kg/m   Physical Exam  ED Results / Procedures / Treatments   Labs (all labs ordered are listed, but only abnormal results are displayed) Labs Reviewed  BASIC METABOLIC PANEL - Abnormal; Notable for the following components:      Result Value   Glucose, Bld 166 (*)    All other components within normal limits  CBC  D-DIMER, QUANTITATIVE  TROPONIN I (HIGH SENSITIVITY)    EKG EKG Interpretation  Date/Time:  Thursday Aug 03 2020 10:12:12 EDT Ventricular Rate:  79 PR Interval:  122 QRS Duration: 90 QT Interval:  396 QTC Calculation: 454 R Axis:   22 Text Interpretation: Normal sinus rhythm Normal ECG Confirmed by Pattricia Boss [phone]) on 08/03/2020 12:06:11 PM   Radiology DG Chest 2 View  Result Date: 08/03/2020 CLINICAL DATA:  Chest pain for 2 EXAM: CHEST - 2 VIEW  COMPARISON:  05/05/2020 chest radiograph. FINDINGS: Stable cardiomediastinal silhouette with top-normal heart size. No pneumothorax. No pleural effusion. Lungs appear clear, with no acute consolidative airspace disease and no pulmonary edema. Cholecystectomy clips are seen in the right upper quadrant of the abdomen. IMPRESSION: No active cardiopulmonary disease. Electronically Signed   By: Ilona Sorrel M.D.   On: 08/03/2020 10:48    Procedures  Procedures   Medications Ordered in ED Medications - No data to display  ED Course  I have reviewed the triage vital signs and the nursing notes.  Pertinent labs & imaging results that were available during my care of the patient were reviewed by me and considered in my medical decision making (see chart for details).    MDM Rules/Calculators/A&P                          Patient with left-sided chest pain.  Has been going since last night.  Constant.  Left upper chest.  Sharp episodes of a 2.  Reportedly has previous reassuring cath.  Does have some chronic left-sided chest pain.  However has had recent swelling in the leg due to ankle injury.  With this I think it puts her at high enough risk to get a D-dimer.  If negative can be discharged home with likely chest wall pain.  If positive will need CT scan.  Care turned over to Dr. Vallery Ridge. Final Clinical Impression(s) / ED Diagnoses Final diagnoses:  Nonspecific chest pain    Rx / DC Orders ED Discharge Orders    None       Davonna Belling, MD 08/03/20 1558

## 2020-08-03 NOTE — ED Notes (Signed)
Patient transported to CT 

## 2020-08-07 ENCOUNTER — Ambulatory Visit (HOSPITAL_COMMUNITY)
Admission: EM | Admit: 2020-08-07 | Discharge: 2020-08-07 | Disposition: A | Discharge status: Home / Self Care | Payer: Medicare Other | Attending: Internal Medicine | Admitting: Internal Medicine

## 2020-08-07 ENCOUNTER — Ambulatory Visit (INDEPENDENT_AMBULATORY_CARE_PROVIDER_SITE_OTHER): Payer: Medicare Other

## 2020-08-07 ENCOUNTER — Other Ambulatory Visit: Payer: Self-pay

## 2020-08-07 ENCOUNTER — Encounter (HOSPITAL_COMMUNITY): Payer: Self-pay | Admitting: *Deleted

## 2020-08-07 DIAGNOSIS — R0781 Pleurodynia: Secondary | ICD-10-CM

## 2020-08-07 DIAGNOSIS — R079 Chest pain, unspecified: Secondary | ICD-10-CM

## 2020-08-07 DIAGNOSIS — M7918 Myalgia, other site: Secondary | ICD-10-CM

## 2020-08-07 MED ORDER — TIZANIDINE HCL 4 MG PO TABS: 4.0000 mg | ORAL_TABLET | Freq: Four times a day (QID) | ORAL | 0 refills | Status: DC | PRN

## 2020-08-07 MED ORDER — NAPROXEN 500 MG PO TABS: 500.0000 mg | ORAL_TABLET | Freq: Two times a day (BID) | ORAL | 0 refills | Status: DC
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
## 2020-08-07 NOTE — Discharge Instructions (Addendum)
Take the naproxen twice a day for the next few days for pain.  You can use the Zanaflex as needed for muscle pain and spasms.    You can use heat or alternate heat and ice for comfort.    Rest as much as possible for the next few days.    If symptoms do not improve in the next week, follow up with orthopedics.
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
## 2020-08-08 ENCOUNTER — Other Ambulatory Visit: Payer: Self-pay

## 2020-08-08 ENCOUNTER — Encounter: Payer: Self-pay | Admitting: Family Medicine

## 2020-08-08 ENCOUNTER — Ambulatory Visit (INDEPENDENT_AMBULATORY_CARE_PROVIDER_SITE_OTHER): Payer: Medicare Other | Admitting: Family Medicine

## 2020-08-08 VITALS — BP 128/64 | HR 69 | Wt 181.0 lb

## 2020-08-08 DIAGNOSIS — R0789 Other chest pain: Secondary | ICD-10-CM
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
## 2020-08-13 DIAGNOSIS — R0789 Other chest pain: Secondary | ICD-10-CM | POA: Diagnosis not present

## 2020-08-13 DIAGNOSIS — R531 Weakness: Secondary | ICD-10-CM | POA: Diagnosis not present

## 2020-08-13 DIAGNOSIS — R079 Chest pain, unspecified: Secondary | ICD-10-CM | POA: Diagnosis not present

## 2020-08-15 ENCOUNTER — Encounter: Payer: Self-pay | Admitting: Family Medicine

## 2020-08-15 ENCOUNTER — Other Ambulatory Visit: Payer: Self-pay

## 2020-08-15 ENCOUNTER — Ambulatory Visit (INDEPENDENT_AMBULATORY_CARE_PROVIDER_SITE_OTHER): Payer: Medicare Other | Admitting: Family Medicine

## 2020-08-15 VITALS — BP 120/60 | HR 69 | Ht 68.0 in | Wt 183.1 lb

## 2020-08-15 DIAGNOSIS — M79674 Pain in right toe(s): Secondary | ICD-10-CM | POA: Diagnosis not present

## 2020-08-15 DIAGNOSIS — M940 Chondrocostal junction syndrome [Tietze]: Secondary | ICD-10-CM | POA: Diagnosis not present

## 2020-08-15 DIAGNOSIS — M79676 Pain in unspecified toe(s): Secondary | ICD-10-CM | POA: Insufficient documentation

## 2020-08-15 DIAGNOSIS — L659 Nonscarring hair loss, unspecified: Secondary | ICD-10-CM

## 2020-08-15 NOTE — Assessment & Plan Note (Signed)
Stable hair loss in middle of scalp without complete balding. Last TSH was WNL in March. No rashes or skin changes.  Referral to dermatology for further management and evaluation

## 2020-08-15 NOTE — Assessment & Plan Note (Signed)
Pt's chest pain has been evaluated in various emergent settings and is felt to be costochondritis. Has responded well to PO NSAIDs. Nevertheless, given comorbids, it is appropriate to proceed with Cardiology evaluation.  Recommend beginning Voltaren gel topically to replace PO NSAIDs.

## 2020-08-15 NOTE — Assessment & Plan Note (Signed)
No warmth or erythema, and no signs of infection. Pulses and sensation are normal. No signs of trauma. Nail bed appears to have a bruise within it and could be related to poor fitting shoes. Given DM and HLD, appropriate to consider podiatry referral which pt would prefer as well.

## 2020-08-15 NOTE — Patient Instructions (Signed)
Thank you for coming to see me today. It was a pleasure. Today we talked about:   You can pick up voltaren gel to use as needed on your chest.  This is over the counter at the pharmacy.  I have placed a referral to Dermatology for your hair loss, and podiatry for your nail pain.  If you do not hear from them in the next 2 weeks, please give [place] a call.  Please follow-up with me in June as scheduled.  If you have any questions or concerns, please do not hesitate to call the office at [phone].  Best,   [place] Constable, DO

## 2020-08-15 NOTE — Progress Notes (Signed)
    SUBJECTIVE:   CHIEF COMPLAINT / HPI:   ALETTA EDMUNDS returns today for follow up of her suspected costochondritis, toe pain, and hair loss.  Chest pain The pt has had multiple evaluations at the ED, urgent care, and by EMS for recurring chest pain. EKGs and appropriate work up has been unrevealing. Pain is sharp and is located on the lower left chest wall and is reproducible with pressure. Has responded well to Naproxen and Zanaflex since her 08/07/20 Urgent care visit. She is awaiting her first visit with Cardiology on 08/22/20 and is anticipating a stress test.  Toe Pain Pt broke her right foot a couple months ago and completed PT one month ago. She has had right big toe pain that is described as a dull, throbbing and constant. She has also had purple discoloration under the nail bed for the same amount of time. Retains sensation in both feet.   Hair loss The pt has had hair loss for several years on the top of her head without complete balding. She has been stable with Levothyroxine replacement. Denies pruritus, pain or rashes.  PERTINENT  PMH / PSH:  DM HLD GERD Hypothyroidism  OBJECTIVE:   BP 120/60   Pulse 69   Ht 5\' 8"  (1.727 m)   Wt 183 lb 2 oz (83.1 kg)   SpO2 99%   BMI 27.84 kg/m   Physical Exam Constitutional:      Appearance: Normal appearance.  Cardiovascular:     Rate and Rhythm: Normal rate and regular rhythm.     Pulses: Normal pulses.     Heart sounds: Normal heart sounds.  Pulmonary:     Effort: Pulmonary effort is normal.     Breath sounds: Normal breath sounds.  Musculoskeletal:        General: No swelling, deformity or signs of injury.     Right lower leg: No edema.     Left lower leg: No edema.     Comments: Lower left chest wall tender to pressure Right great toe tender to pressure  Skin:    General: Skin is warm and dry.  Neurological:     Mental Status: She is alert and oriented to person, place, and time.  Psychiatric:        Mood and  Affect: Mood normal.        Behavior: Behavior normal.      ASSESSMENT/PLAN:   Costochondritis Pt's chest pain has been evaluated in various emergent settings and is felt to be costochondritis. Has responded well to PO NSAIDs. Nevertheless, given comorbids, it is appropriate to proceed with Cardiology evaluation.  Recommend beginning Voltaren gel topically to replace PO NSAIDs.  Great toe pain No warmth or erythema, and no signs of infection. Pulses and sensation are normal. No signs of trauma. Nail bed appears to have a bruise within it and could be related to poor fitting shoes. Given DM and HLD, appropriate to consider podiatry referral which pt would prefer as well.   Hair loss Stable hair loss in middle of scalp without complete balding. Last TSH was WNL in March. No rashes or skin changes.  Referral to dermatology for further management and evaluation      [place]

## 2020-08-22 ENCOUNTER — Other Ambulatory Visit: Payer: Self-pay

## 2020-08-22 ENCOUNTER — Encounter: Payer: Self-pay | Admitting: Cardiology

## 2020-08-22 ENCOUNTER — Ambulatory Visit (INDEPENDENT_AMBULATORY_CARE_PROVIDER_SITE_OTHER): Payer: Medicare Other | Admitting: Cardiology

## 2020-08-22 VITALS — BP 126/70 | HR 65 | Ht 68.0 in | Wt 181.0 lb

## 2020-08-22 DIAGNOSIS — E7800 Pure hypercholesterolemia, unspecified: Secondary | ICD-10-CM | POA: Diagnosis not present

## 2020-08-22 DIAGNOSIS — I10 Essential (primary) hypertension: Secondary | ICD-10-CM

## 2020-08-22 DIAGNOSIS — R072 Precordial pain: Secondary | ICD-10-CM | POA: Diagnosis not present

## 2020-08-22 DIAGNOSIS — R079 Chest pain, unspecified: Secondary | ICD-10-CM | POA: Diagnosis not present

## 2020-08-22 NOTE — Patient Instructions (Signed)
Medication Instructions:  Your physician recommends that you continue on your current medications as directed. Please refer to the Current Medication list given to you today.  *If you need a refill on your cardiac medications before your next appointment, please call your pharmacy*  Lab Work: Fasting lipids and ALT on the same day as your stress test  Testing/Procedures: Your physician has requested that you have an echocardiogram. Echocardiography is a painless test that uses sound waves to create images of your heart. It provides your doctor with information about the size and shape of your heart and how well your heart's chambers and valves are working. This procedure takes approximately one hour. There are no restrictions for this procedure.  Your physician has requested that you have a lexiscan myoview. For further information please visit [email or web address]. Please follow instruction sheet, as given.  Your physician has requested that your wear a blood pressure monitor.   Follow-Up: At [facility], you and your health needs are our priority.  As part of our continuing mission to provide you with exceptional heart care, we have created designated Provider Care Teams.  These Care Teams include your primary Cardiologist (physician) and Advanced Practice Providers (APPs -  Physician Assistants and Nurse Practitioners) who all work together to provide you with the care you need, when you need it.  Follow up with Dr. Radford Pax as needed based on results of testing.
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
## 2020-08-25 ENCOUNTER — Ambulatory Visit (INDEPENDENT_AMBULATORY_CARE_PROVIDER_SITE_OTHER): Payer: Medicare Other | Admitting: Podiatry

## 2020-08-25 ENCOUNTER — Other Ambulatory Visit: Payer: Self-pay

## 2020-08-25 DIAGNOSIS — S90111A Contusion of right great toe without damage to nail, initial encounter: Secondary | ICD-10-CM

## 2020-08-29 ENCOUNTER — Encounter: Payer: Self-pay | Admitting: Podiatry

## 2020-08-29 NOTE — Progress Notes (Signed)
Subjective:  Patient ID: Lori Jones, female    DOB: 10/24/1960,  MRN: [id number]  Chief Complaint  Patient presents with  . Nail Problem    Right hallux nail discolored     60 y.o. female presents with the above complaint.  Patient presents with complaint of right hallux mild contusion.  Patient states nail has become discolored over the last few weeks.  Patient states that is she is a diabetic with last A1c of 6.8.  She wanted to get evaluated.  She does not have any pain.  The nail is pretty well adhered.  She was worried about a little bit of blood that is present on the nail.  She may have had a trauma to the detail she does not recall the exact time.  She denies any other acute issues.  She has not seen anyone else prior to seeing me   Review of Systems: Negative except as noted in the HPI. Denies N/V/F/Ch.  Past Medical History:  Diagnosis Date  . Allergy   . Anemia   . Anginal pain (HCC)    hx of CPs  . Anxiety   . Arthritis   . Asthma    hx of in childhood   . Back pain, chronic   . Bursitis   . Depression   . Diabetes mellitus   . Foot fracture, right   . Heart murmur   . Herniated disc   . Hypercholesteremia   . Hypertension    no meds  . Hypothyroid   . Kidney disease    "pelvic kidney" bilat   . Scoliosis   . Seizures ([place])    hx of in childhood and again in college   . Stroke [facility])    hx of sun stroke while in college     Current Outpatient Medications:  .  aspirin EC 81 MG tablet, Take 1 tablet (81 mg total) by mouth daily., Disp: 90 tablet, Rfl: 0 .  atorvastatin (LIPITOR) 40 MG tablet, Take 1 tablet (40 mg total) by mouth daily., Disp: 90 tablet, Rfl: 3 .  diphenhydrAMINE-zinc acetate (BENADRYL EXTRA STRENGTH) cream, Apply 1 application topically 3 (three) times daily as needed for itching., Disp: 28.4 g, Rfl: 0 .  escitalopram (LEXAPRO) 10 MG tablet, Take 1 tablet (10 mg total) by mouth daily., Disp: 90 tablet, Rfl: 3 .  famotidine (PEPCID) 20 MG  tablet, Take 1 tablet (20 mg total) by mouth 2 (two) times daily. (Patient taking differently: Take 20 mg by mouth 2 (two) times daily as needed.), Disp: 28 tablet, Rfl: 0 .  levothyroxine (SYNTHROID) 25 MCG tablet, TAKE 1 TABLET(25 MCG) BY MOUTH DAILY BEFORE BREAKFAST, Disp: 90 tablet, Rfl: 3 .  metFORMIN (GLUCOPHAGE) 1000 MG tablet, TAKE 1 TABLET BY MOUTH TWICE DAILY WITH A MEAL, Disp: 180 tablet, Rfl: 3 .  naproxen (NAPROSYN) 500 MG tablet, Take 1 tablet (500 mg total) by mouth 2 (two) times daily. (Patient taking differently: Take 500 mg by mouth as needed.), Disp: 30 tablet, Rfl: 0 .  tiZANidine (ZANAFLEX) 4 MG tablet, Take 1 tablet (4 mg total) by mouth every 6 (six) hours as needed for muscle spasms., Disp: 30 tablet, Rfl: 0  Social History   Tobacco Use  Smoking Status Never Smoker  Smokeless Tobacco Never Used    Allergies  Allergen Reactions  . Clarithromycin Palpitations and Other (See Comments)    hallucinations Other reaction(s): Other (See Comments) hullicinations   . Sumatriptan Shortness Of Breath and Palpitations  Feels like having a heart attack. Other reaction(s): Chest Pain  . Canagliflozin Itching and Other (See Comments)    Likely Yeast/ Fungal Infection a few after starting.  Resolved with stopping medicine. Occurred with combination agent (canagliflozin with metformin).  Returned to taking metformin without any adverse effect.   . Canagliflozin-Metformin Hcl Itching    Likely Yeast/ Fungal Infection a few after starting.  Resolved with stopping medicine.   . Chlorhexidine Rash  . Sumatriptan Succinate Palpitations    Other reaction(s): Respiratory Distress (ALLERGY/intolerance)   Objective:  There were no vitals filed for this visit. There is no height or weight on file to calculate BMI. Constitutional Well developed. Well nourished.  Vascular Dorsalis pedis pulses palpable bilaterally. Posterior tibial pulses palpable bilaterally. Capillary refill  normal to all digits.  No cyanosis or clubbing noted. Pedal hair growth normal.  Neurologic Normal speech. Oriented to person, place, and time. Epicritic sensation to light touch grossly present bilaterally.  Dermatologic Nails well groomed and normal in appearance. No open wounds. No skin lesions.  Orthopedic:  No pain on palpation to the right hallux nail.  Mild hematoma noted less than 15%.  Nail is well adhered to the underlying nailbed.  First metatarsophalangeal joint strength within normal limits.   Radiographs: None Assessment:   1. Contusion of right great toe without damage to nail, initial encounter    Plan:  Patient was evaluated and treated and all questions answered.  Right hallux mild contusion  -Clinically she has a mild hematoma at this time I will hold off and just simply monitor clinically.  I instructed and educated her on hematoma formation.  If it continues to get worse we can discuss doing a nail total nail avulsion however at this time is less than 15% involved.  She states understanding and without any pain we will hold off on doing a nail avulsion. -If any foot and ankle issues arise in the future have asked her to come back and see me.  She states understanding     No follow-ups on file.

## 2020-08-30 ENCOUNTER — Telehealth (HOSPITAL_COMMUNITY): Payer: Self-pay | Admitting: *Deleted

## 2020-08-30 NOTE — Telephone Encounter (Signed)
Patient given detailed instructions per Myocardial Perfusion Study Information Sheet for the test on 09/04/20 at 7:45. Patient notified to arrive 15 minutes early and that it is imperative to arrive on time for appointment to keep from having the test rescheduled.  If you need to cancel or reschedule your appointment, please call the office within 24 hours of your appointment. . Patient verbalized understanding.Lori Jones

## 2020-08-30 NOTE — Progress Notes (Signed)
    SUBJECTIVE:   CHIEF COMPLAINT / HPI:   Follow-up chest pain Seen by cardiology on 5/24 Planning for stress test on 6/6 and echo on 6/23 Having ambulatory BP monitoring on 6/6 No chest pain anymore and she is very happy about this She thinks it was stress causing the pain  T2DM with HLD Current regimen: Metformin 1000 mg twice daily Last A1c on 4/6 was 6.8 Last BMP on 5/5 was WNL On Lipitor 40 mg daily, last lipid panel 09/14/2019, LDL 71 at that time, her goal is less than 70 Cardiology is planning to repeat lipid panel as well as obtain an ALT Has had elevated urine albumin to creatinine ratio on last check, plan for send out today  PERTINENT  PMH / PSH: GERD, T2DM, hypothyroidism, schizoaffective disorder, HLD, anxiety and depression  OBJECTIVE:   BP 122/80   Pulse 71   Ht 5\' 8"  (1.727 m)   Wt 181 lb 12.8 oz (82.5 kg)   SpO2 97%   BMI 27.64 kg/m    Physical Exam:  General: 60 y.o. female in NAD Cardio: RRR no m/r/g Lungs: CTAB, no wheezing, no rhonchi, no crackles, no IWOB on RA Skin: warm and dry Extremities: No edema   ASSESSMENT/PLAN:   Atypical chest pain Now resolved.  Following with cardiology.  Will obtain lipid panel and CMP today as they are requesting ALT.    Diabetes mellitus without complication ([place]) Well-controlled.  Continue current regimen.  On Lipitor, lipid panel and CMP per above.  We will perform send out urine microalbumin to creatinine ratio, had previously discussed ARB therapy but she had wanted to wait.  If this remains elevated, would recommend starting low-dose losartan.  HLD (hyperlipidemia) Lipid panel today.  Continue Lipitor.     Cleophas Dunker, [place]

## 2020-08-31 DIAGNOSIS — Z1231 Encounter for screening mammogram for malignant neoplasm of breast: Secondary | ICD-10-CM | POA: Diagnosis not present

## 2020-09-01 ENCOUNTER — Ambulatory Visit (INDEPENDENT_AMBULATORY_CARE_PROVIDER_SITE_OTHER): Payer: Medicare Other | Admitting: Family Medicine

## 2020-09-01 ENCOUNTER — Other Ambulatory Visit: Payer: Self-pay

## 2020-09-01 ENCOUNTER — Encounter: Payer: Self-pay | Admitting: Family Medicine

## 2020-09-01 VITALS — BP 122/80 | HR 71 | Ht 68.0 in | Wt 181.8 lb

## 2020-09-01 DIAGNOSIS — R0789 Other chest pain: Secondary | ICD-10-CM

## 2020-09-01 DIAGNOSIS — E7800 Pure hypercholesterolemia, unspecified: Secondary | ICD-10-CM | POA: Diagnosis not present

## 2020-09-01 DIAGNOSIS — E119 Type 2 diabetes mellitus without complications: Secondary | ICD-10-CM | POA: Diagnosis not present

## 2020-09-01 MED ORDER — NAPROXEN 500 MG PO TABS: 500.0000 mg | ORAL_TABLET | ORAL | Status: DC | PRN

## 2020-09-01 NOTE — Assessment & Plan Note (Signed)
Well-controlled.  Continue current regimen.  On Lipitor, lipid panel and CMP per above.  We will perform send out urine microalbumin to creatinine ratio, had previously discussed ARB therapy but she had wanted to wait.  If this remains elevated, would recommend starting low-dose losartan.

## 2020-09-01 NOTE — Patient Instructions (Signed)
Thank you for coming to see me today. It was a pleasure. Today we talked about:   We will get some labs today.  If they are abnormal or we need to do something about them, I will call you.  If they are normal, I will send you a message on MyChart (if it is active) or a letter in the mail.  If you don't hear from [place] in 2 weeks, please call the office at the number below.  Continue everything else.  I'm so glad things are going well for you!  Call [facility] at [phone].  Please follow-up with new PCP in 3 months or sooner as needed.  If you have any questions or concerns, please do not hesitate to call the office at [phone].  Best,   [place] Constable, DO

## 2020-09-01 NOTE — Assessment & Plan Note (Signed)
Now resolved.  Following with cardiology.  Will obtain lipid panel and CMP today as they are requesting ALT.

## 2020-09-01 NOTE — Assessment & Plan Note (Signed)
Lipid panel today. Continue Lipitor. 

## 2020-09-02 LAB — LIPID PANEL
Chol/HDL Ratio: 2.9 ratio (ref 0.0–4.4)
Cholesterol, Total: 115 mg/dL (ref 100–199)
HDL: 40 mg/dL (ref >39)
LDL Chol Calc (NIH): 63 mg/dL (ref 0–99)
Triglycerides: 53 mg/dL (ref 0–149)
VLDL Cholesterol Cal: 12 mg/dL (ref 5–40)

## 2020-09-02 LAB — COMPREHENSIVE METABOLIC PANEL
ALT: 26 IU/L (ref 0–32)
AST: 20 IU/L (ref 0–40)
Albumin/Globulin Ratio: 2 (ref 1.2–2.2)
Albumin: 4.4 g/dL (ref 3.8–4.9)
Alkaline Phosphatase: 82 IU/L (ref 44–121)
BUN/Creatinine Ratio: 16 (ref 12–28)
BUN: 12 mg/dL (ref 8–27)
Bilirubin Total: 0.7 mg/dL (ref 0.0–1.2)
CO2: 21 mmol/L (ref 20–29)
Calcium: 9.3 mg/dL (ref 8.7–10.3)
Chloride: 106 mmol/L (ref 96–106)
Creatinine, Ser: 0.74 mg/dL (ref 0.57–1.00)
Globulin, Total: 2.2 g/dL (ref 1.5–4.5)
Glucose: 155 mg/dL — ABNORMAL HIGH (ref 65–99)
Potassium: 4.1 mmol/L (ref 3.5–5.2)
Sodium: 141 mmol/L (ref 134–144)
Total Protein: 6.6 g/dL (ref 6.0–8.5)
eGFR: 93 mL/min/{1.73_m2} (ref >59)

## 2020-09-02 LAB — MICROALBUMIN / CREATININE URINE RATIO
Creatinine, Urine: 153.9 mg/dL
Microalb/Creat Ratio: 25 mg/g creat (ref 0–29)
Microalbumin, Urine: 38.1 ug/mL

## 2020-09-04 ENCOUNTER — Ambulatory Visit (HOSPITAL_COMMUNITY): Payer: Medicare Other | Attending: Cardiology

## 2020-09-04 ENCOUNTER — Ambulatory Visit (INDEPENDENT_AMBULATORY_CARE_PROVIDER_SITE_OTHER): Payer: Medicare Other

## 2020-09-04 ENCOUNTER — Other Ambulatory Visit: Payer: Medicare Other | Admitting: *Deleted

## 2020-09-04 ENCOUNTER — Other Ambulatory Visit: Payer: Self-pay

## 2020-09-04 DIAGNOSIS — E7800 Pure hypercholesterolemia, unspecified: Secondary | ICD-10-CM | POA: Diagnosis not present

## 2020-09-04 DIAGNOSIS — R072 Precordial pain: Secondary | ICD-10-CM | POA: Diagnosis not present

## 2020-09-04 DIAGNOSIS — I10 Essential (primary) hypertension: Secondary | ICD-10-CM

## 2020-09-04 LAB — ALT: ALT: 25 IU/L (ref 0–32)

## 2020-09-04 LAB — LIPID PANEL
Chol/HDL Ratio: 3.2 ratio (ref 0.0–4.4)
Cholesterol, Total: 117 mg/dL (ref 100–199)
HDL: 37 mg/dL — ABNORMAL LOW (ref >39)
LDL Chol Calc (NIH): 69 mg/dL (ref 0–99)
Triglycerides: 43 mg/dL (ref 0–149)
VLDL Cholesterol Cal: 11 mg/dL (ref 5–40)

## 2020-09-04 LAB — MYOCARDIAL PERFUSION IMAGING
LV dias vol: 86 mL (ref 46–106)
LV sys vol: 29 mL
Peak HR: 93 {beats}/min
Rest HR: 66 {beats}/min
SDS: 3
SRS: 0
SSS: 3
TID: 1.02

## 2020-09-04 MED ORDER — TECHNETIUM TC 99M TETROFOSMIN IV KIT
10.1000 | PACK | Freq: Once | INTRAVENOUS | Status: AC | PRN
  Administered 2020-09-04: 10.1 via INTRAVENOUS
  Filled 2020-09-04: qty 11

## 2020-09-04 MED ORDER — REGADENOSON 0.4 MG/5ML IV SOLN
0.4000 mg | Freq: Once | INTRAVENOUS | Status: AC
  Administered 2020-09-04: 0.4 mg via INTRAVENOUS

## 2020-09-04 MED ORDER — TECHNETIUM TC 99M TETROFOSMIN IV KIT
30.5000 | PACK | Freq: Once | INTRAVENOUS | Status: AC | PRN
Start: 2020-09-04 — End: 2020-09-04
  Administered 2020-09-04: 30.5 via INTRAVENOUS
  Filled 2020-09-04: qty 31

## 2020-09-04 NOTE — Progress Notes (Unsigned)
24-48 Hour ambulatory blood pressure monitor applied to patient using standard adult cuff.

## 2020-09-05 ENCOUNTER — Encounter: Payer: Self-pay | Admitting: Family Medicine

## 2020-09-06 ENCOUNTER — Other Ambulatory Visit: Payer: Self-pay

## 2020-09-06 ENCOUNTER — Ambulatory Visit (INDEPENDENT_AMBULATORY_CARE_PROVIDER_SITE_OTHER): Payer: Medicare Other

## 2020-09-06 VITALS — BP 134/68 | HR 59 | Ht 68.0 in | Wt 183.8 lb

## 2020-09-06 DIAGNOSIS — R072 Precordial pain: Secondary | ICD-10-CM | POA: Diagnosis not present

## 2020-09-06 DIAGNOSIS — R079 Chest pain, unspecified: Secondary | ICD-10-CM | POA: Diagnosis not present

## 2020-09-06 NOTE — Patient Instructions (Signed)
Medication Instructions:  Your physician recommends that you continue on your current medications as directed. Please refer to the Current Medication list given to you today.  *If you need a refill on your cardiac medications before your next appointment, please call your pharmacy*  Testing/Procedures: Your provider has recommended that you have a Calcium Score CT scan.   Follow-Up: At [facility], you and your health needs are our priority.  As part of our continuing mission to provide you with exceptional heart care, we have created designated Provider Care Teams.  These Care Teams include your primary Cardiologist (physician) and Advanced Practice Providers (APPs -  Physician Assistants and Nurse Practitioners) who all work together to provide you with the care you need, when you need it.

## 2020-09-06 NOTE — Progress Notes (Signed)
1.) Reason for visit: Pt returning 24 hour BP cuff and c/o CP to front desk staff.   I advised her to go to the ED for evaluation.  Pt did not want to go, informed Dr. Radford Pax she ordered EKG and BP check.  2.) Name of MD requesting visit: Fransico Him, MD  3.) H&P: Pt has a history of atypical CP.  09/04/20 Stress test negative.   4.) ROS related to problem: Pt reports she notes CP when she is stressed.  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  During nurse visit pt reports that she was calming down and was starting to feel better.    5.) Assessment and plan per MD: Dr. Radford Pax reviewed EKG sinus brady BP 134/68-59.  She ordered for pt to have a calcium score test. Pt is aware but does not have the money to pay for testing at this time.  Reports should be able to afford test in a month.

## 2020-09-06 NOTE — Addendum Note (Signed)
Addended by: Precious Gilding on: 09/06/2020 10:48 AM   Modules accepted: Orders

## 2020-09-08 ENCOUNTER — Telehealth: Payer: Self-pay | Admitting: Cardiology

## 2020-09-08 NOTE — Telephone Encounter (Signed)
Patient is returning call to discuss lab results. 

## 2020-09-08 NOTE — Telephone Encounter (Signed)
Lori Margarita, MD  09/07/2020 12:36 PM EDT      Good BP control - no changes - follow < 2gm Na deit   The patient has been notified of the result and verbalized understanding.  All questions (if any) were answered. Antonieta Iba, RN 09/08/2020 12:17 PM

## 2020-09-13 ENCOUNTER — Other Ambulatory Visit: Payer: Self-pay

## 2020-09-13 ENCOUNTER — Ambulatory Visit (INDEPENDENT_AMBULATORY_CARE_PROVIDER_SITE_OTHER): Payer: Medicare Other

## 2020-09-13 VITALS — BP 116/62 | HR 62 | Ht 68.0 in | Wt 182.0 lb

## 2020-09-13 DIAGNOSIS — Z23 Encounter for immunization: Secondary | ICD-10-CM | POA: Diagnosis not present

## 2020-09-13 DIAGNOSIS — Z0000 Encounter for general adult medical examination without abnormal findings: Secondary | ICD-10-CM | POA: Diagnosis not present

## 2020-09-13 NOTE — Progress Notes (Addendum)
Subjective:   Lori Jones is a 60 y.o. female who presents for Medicare Annual (Subsequent) preventive examination.  Review of Systems: Defer to PCP.  Cardiac Risk Factors include: diabetes mellitus  Objective:   Vitals: BP 116/62   Pulse 62   Ht 5\' 8"  (1.727 m)   Wt 182 lb (82.6 kg)   SpO2 99%   BMI 27.67 kg/m   Body mass index is 27.67 kg/m.  Advanced Directives 09/13/2020 09/01/2020 08/15/2020 08/08/2020 08/03/2020 04/26/2020 04/26/2020  Does Patient Have a Medical Advance Directive? No No No No No No No  Would patient like information on creating a medical advance directive? Yes (MAU/Ambulatory/Procedural Areas - Information given) No - Patient declined No - Patient declined No - Patient declined No - Patient declined (No Data) No - Patient declined   Tobacco Social History   Tobacco Use  Smoking Status Never  Smokeless Tobacco Never     Clinical Intake:  Pre-visit preparation completed: Yes  How often do you need to have someone help you when you read instructions, pamphlets, or other written materials from your doctor or pharmacy?: 1 - Never What is the last grade level you completed in school?: bachelors in social work  Interpreter Needed?: No  Past Medical History:  Diagnosis Date   Allergy    Anemia    Anginal pain ([place])    hx of CPs   Anxiety    Arthritis    Asthma    hx of in childhood    Back pain, chronic    Bursitis    Depression    Diabetes mellitus    Foot fracture, right    Heart murmur    Herniated disc    Hypercholesteremia    Hypertension    no meds   Hypothyroid    Kidney disease    "pelvic kidney" bilat    Scoliosis    Seizures ([place])    hx of in childhood and again in college    Stroke [facility])    hx of sun stroke while in college    Past Surgical History:  Procedure Laterality Date   ABDOMINAL HYSTERECTOMY     CESAREAN SECTION     CHOLECYSTECTOMY N/A 09/08/2014   Procedure: LAPAROSCOPIC CHOLECYSTECTOMY;  Surgeon: Coralie Keens, MD;  Location: [facility];  Service: General;  Laterality: N/A;   HAND SURGERY     dog bite   INCISIONAL HERNIA REPAIR N/A 04/24/2016   Procedure: HERNIA REPAIR INCISIONAL;  Surgeon: Coralie Keens, MD;  Location: [facility] OR;  Service: General;  Laterality: N/A;   INSERTION OF MESH N/A 04/24/2016   Procedure: INSERTION OF MESH;  Surgeon: Coralie Keens, MD;  Location: [place];  Service: General;  Laterality: N/A;   Thumb surgery     VENTRAL HERNIA REPAIR N/A 09/08/2014   Procedure: VENTRAL HERNIA REPAIR;  Surgeon: Coralie Keens, MD;  Location: [facility];  Service: General;  Laterality: N/A;   Family History  Problem Relation Age of Onset   Cancer Mother    Depression Mother    Diabetes Mother    Heart disease Mother 73       Stents   Hypertension Mother    Hyperlipidemia Mother    Thyroid disease Mother    Cancer Father    Sickle cell anemia Brother    Diabetes Sister    Social History   Socioeconomic History   Marital status: Single    Spouse name: Not on file   Number of children:  1   Years of education: 16   Highest education level: Bachelor's degree (e.g., BA, AB, BS)  Occupational History   Not on file  Tobacco Use   Smoking status: Never   Smokeless tobacco: Never  Vaping Use   Vaping Use: Never used  Substance and Sexual Activity   Alcohol use: No    Alcohol/week: 0.0 standard drinks   Drug use: No   Sexual activity: Not Currently  Other Topics Concern   Not on file  Social History Narrative   Patient lives alone in [place].    Patient has bachelors in social work.    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 Does not work full time.   Patient has one daughter who lives in [place], no grandchildren.    Patient enjoys reading, watching westerns, and fellowship.   Patient walks for exercise 4x per week.    Social Determinants of Health   Financial Resource Strain: Low Risk    Difficulty of Paying Living Expenses: Not hard at all  Food Insecurity: Food Insecurity  Present   Worried About Charity fundraiser in the Last Year: Sometimes true   Arboriculturist in the Last Year: Sometimes true  Transportation Needs: No Transportation Needs   Lack of Transportation (Medical): No   Lack of Transportation (Non-Medical): No  Physical Activity: Sufficiently Active   Days of Exercise per Week: 4 days   Minutes of Exercise per Session: 40 min  Stress: Stress Concern Present   Feeling of Stress : To some extent  Social Connections: Moderately Integrated   Frequency of Communication with Friends and Family: More than three times a week   Frequency of Social Gatherings with Friends and Family: Three times a week   Attends Religious Services: More than 4 times per year   Active Member of Clubs or Organizations: Yes   Attends Archivist Meetings: More than 4 times per year   Marital Status: Never married   Outpatient Encounter Medications as of 09/13/2020  Medication Sig   aspirin EC 81 MG tablet Take 1 tablet (81 mg total) by mouth daily.   atorvastatin (LIPITOR) 40 MG tablet Take 1 tablet (40 mg total) by mouth daily.   escitalopram (LEXAPRO) 10 MG tablet Take 1 tablet (10 mg total) by mouth daily.   levothyroxine (SYNTHROID) 25 MCG tablet TAKE 1 TABLET(25 MCG) BY MOUTH DAILY BEFORE BREAKFAST   metFORMIN (GLUCOPHAGE) 1000 MG tablet TAKE 1 TABLET BY MOUTH TWICE DAILY WITH A MEAL   naproxen (NAPROSYN) 500 MG tablet Take 1 tablet (500 mg total) by mouth as needed.   tiZANidine (ZANAFLEX) 4 MG tablet Take 1 tablet (4 mg total) by mouth every 6 (six) hours as needed for muscle spasms.   No facility-administered encounter medications on file as of 09/13/2020.   Activities of Daily Living In your present state of health, do you have any difficulty performing the following activities: 09/13/2020  Hearing? N  Vision? N  Difficulty concentrating or making decisions? N  Walking or climbing stairs? N  Dressing or bathing? N  Doing errands, shopping? N   Preparing Food and eating ? N  Using the Toilet? N  In the past six months, have you accidently leaked urine? Y  Do you have problems with loss of bowel control? N  Managing your Medications? N  Managing your Finances? N  Housekeeping or managing your Housekeeping? N  Some recent data might be hidden   Patient Care Team: Meccariello,  Bernita Raisin, DO as PCP - General Sueanne Margarita, MD as PCP - Cardiology (Cardiology) Kennith Center, RD as Dietitian (Family Medicine) Mariana Kaufman, RN as Case Manager Clent Jacks, MD as Consulting Physician (Ophthalmology) Juanita Craver, MD as Consulting Physician (Gastroenterology) Iran Planas, MD as Consulting Physician (Orthopedic Surgery) Coralie Keens, MD as Consulting Physician (General Surgery) Linda Hedges, DO as Consulting Physician (Obstetrics and Gynecology)    Assessment:   This is a routine wellness examination for Kaydie.  Exercise Activities and Dietary recommendations Current Exercise Habits: Home exercise routine, Type of exercise: walking, Time (Minutes): 40, Frequency (Times/Week): 4, Weekly Exercise (Minutes/Week): 160, Exercise limited by: psychological condition(s)   Goals        Acknowledge receipt of Advanced Directive package      Packet given today and counseled on completing and return.        Eat more fruits and vegetables (pt-stated)      Exercise 150 minutes per week (moderate activity) (pt-stated)      Weight (lb) < 165 lb (74.8 kg) (pt-stated)      6% weight loss        Fall Risk Fall Risk  09/13/2020 06/01/2019 05/06/2019 09/11/2018 04/22/2018  Falls in the past year? 1 0 0 0 0  Number falls in past yr: 0 0 0 - -  Injury with Fall? 1 0 0 - -  Risk Factor Category  - - - - -  Risk for fall due to : History of fall(s) - - - -  Follow up Falls prevention discussed - - - -   Is the patient's home free of loose throw rugs in walkways, pet beds, electrical cords, etc?   yes      Grab bars in the bathroom?  yes      Handrails on the stairs?   yes      Adequate lighting?   yes  Patient rating of health (0-10) scale: 7   Depression Screen PHQ 2/9 Scores 09/13/2020 09/01/2020 08/15/2020 08/08/2020  PHQ - 2 Score 0 0 0 0  PHQ- 9 Score 0 0 0 0  Exception Documentation - - - -  Not completed - - - -    Cognitive Function MMSE - Mini Mental State Exam 03/17/2018  Orientation to time 5  Orientation to Place 5  Registration 3  Attention/ Calculation 5  Recall 3  Language- name 2 objects 2  Language- repeat 1  Language- follow 3 step command 3  Language- read & follow direction 1  Write a sentence 1  Copy design 1  Total score 30   6CIT Screen 09/13/2020 03/17/2018  What Year? 0 points 0 points  What month? 0 points 0 points  What time? 0 points 0 points  Count back from 20 0 points 0 points  Months in reverse 0 points 0 points  Repeat phrase 0 points 0 points  Total Score 0 0   Immunization History  Administered Date(s) Administered   PFIZER(Purple Top)SARS-COV-2 Vaccination 06/24/2019, 07/19/2019, 02/14/2020   Tdap 06/22/2015   Screening Tests Health Maintenance  Topic Date Due   Zoster Vaccines- Shingrix (1 of 2) Never done   PAP SMEAR-Modifier  03/01/2018   OPHTHALMOLOGY EXAM  05/13/2018   COVID-19 Vaccine (4 - Booster for Pfizer series) 06/13/2020   FOOT EXAM  08/03/2020   PNEUMOCOCCAL POLYSACCHARIDE VACCINE AGE 61-64 HIGH RISK  09/01/2021 (Originally 09/02/1962)   INFLUENZA VACCINE  10/30/2020   HEMOGLOBIN A1C  01/04/2021  URINE MICROALBUMIN  09/01/2021   MAMMOGRAM  09/01/2022   COLONOSCOPY (Pts 45-45yrs Insurance coverage will need to be confirmed)  12/18/2022   TETANUS/TDAP  06/21/2025   Hepatitis C Screening  Completed   HIV Screening  Completed   Pneumococcal Vaccine 20-10 Years old  Aged Out   HPV VACCINES  Aged Out   Cancer Screenings: Lung: Low Dose CT Chest recommended if Age 47-80 years, 30 pack-year currently smoking OR have quit w/in 15years. Patient does not  qualify. Breast:  Up to date on Mammogram? Yes   Up to date of Bone Density/Dexa? NA Colorectal: UTD  Additional Screenings: Hepatitis C Screening: Completed   Plan:  #2 covid booster given today!  Bring card to next visit so we can update.  PCP apt scheduled for 6/29. Pap and eye exam due. Fill out advance directive packet.   I have personally reviewed and noted the following in the patient's chart:   Medical and social history Use of alcohol, tobacco or illicit drugs  Current medications and supplements Functional ability and status Nutritional status Physical activity Advanced directives List of other physicians Hospitalizations, surgeries, and ER visits in previous 12 months Vitals Screenings to include cognitive, depression, and falls Referrals and appointments  In addition, I have reviewed and discussed with patient certain preventive protocols, quality metrics, and best practice recommendations. A written personalized care plan for preventive services as well as general preventive health recommendations were provided to patient.   I have reviewed this visit and agree with the documentation.

## 2020-09-13 NOTE — Patient Instructions (Signed)
You spoke to Lori Jones, [place] for your annual wellness visit.  We discussed goals:   Goals        Acknowledge receipt of Financial controller given today and counseled on completing and return.        Eat more fruits and vegetables (pt-stated)      Exercise 150 minutes per week (moderate activity) (pt-stated)      Weight (lb) < 165 lb (74.8 kg) (pt-stated)      6% weight loss        We also discussed recommended health maintenance. Please call our office and schedule a visit. As discussed, you are due for the following:  Health Maintenance  Topic Date Due   Zoster Vaccines- Shingrix (1 of 2) Never done   PAP SMEAR-Modifier  03/01/2018   OPHTHALMOLOGY EXAM  05/13/2018   COVID-19 Vaccine (4 - Booster for Pfizer series) 06/13/2020   FOOT EXAM  08/03/2020   PNEUMOCOCCAL POLYSACCHARIDE VACCINE AGE 29-64 HIGH RISK  09/01/2021 (Originally 09/02/1962)   INFLUENZA VACCINE  10/30/2020   HEMOGLOBIN A1C  01/04/2021   URINE MICROALBUMIN  09/01/2021   MAMMOGRAM  09/01/2022   COLONOSCOPY (Pts 45-39yr Insurance coverage will need to be confirmed)  12/18/2022   TETANUS/TDAP  06/21/2025   Hepatitis C Screening  Completed   HIV Screening  Completed   Pneumococcal Vaccine [id number] old  Aged Out   HPV VACCINES  Aged Out   #2 covid booster given today!  Bring card to next visit so we can update.  PCP apt scheduled for 6/29. Pap and eye exam due. Fill out advance directive packet.   Preventive Care 420643Years Old, Female Preventive care refers to lifestyle choices and visits with your health care provider that can promote health and wellness. This includes: A yearly physical exam. This is also called an annual wellness visit. Regular dental and eye exams. Immunizations. Screening for certain conditions. Healthy lifestyle choices, such as: Eating a healthy diet. Getting regular exercise. Not using drugs or products that contain nicotine and tobacco. Limiting  alcohol use. What can I expect for my preventive care visit? Physical exam Your health care provider will check your: Height and weight. These may be used to calculate your BMI (body mass index). BMI is a measurement that tells if you are at a healthy weight. Heart rate and blood pressure. Body temperature. Skin for abnormal spots. Counseling Your health care provider may ask you questions about your: Past medical problems. Family's medical history. Alcohol, tobacco, and drug use. Emotional well-being. Home life and relationship well-being. Sexual activity. Diet, exercise, and sleep habits. Work and work eStatistician Access to firearms. Method of birth control. Menstrual cycle. Pregnancy history. What immunizations do I need?  Vaccines are usually given at various ages, according to a schedule. Your health care provider will recommend vaccines for you based on your age, medicalhistory, and lifestyle or other factors, such as travel or where you work. What tests do I need? Blood tests Lipid and cholesterol levels. These may be checked every 5 years, or more often if you are over 564years old. Hepatitis C test. Hepatitis B test. Screening Lung cancer screening. You may have this screening every year starting at age 6634if you have a 30-pack-year history of smoking and currently smoke or have quit within the past 15 years. Colorectal cancer screening. All adults should have this screening starting at age 6666and continuing until age 61 Your  health care provider may recommend screening at age 68 if you are at increased risk. You will have tests every 1-10 years, depending on your results and the type of screening test. Diabetes screening. This is done by checking your blood sugar (glucose) after you have not eaten for a while (fasting). You may have this done every 1-3 years. Mammogram. This may be done every 1-2 years. Talk with your health care provider about when you should  start having regular mammograms. This may depend on whether you have a family history of breast cancer. BRCA-related cancer screening. This may be done if you have a family history of breast, ovarian, tubal, or peritoneal cancers. Pelvic exam and Pap test. This may be done every 3 years starting at age 11. Starting at age 29, this may be done every 5 years if you have a Pap test in combination with an HPV test. Other tests STD (sexually transmitted disease) testing, if you are at risk. Bone density scan. This is done to screen for osteoporosis. You may have this scan if you are at high risk for osteoporosis. Talk with your health care provider about your test results, treatment options,and if necessary, the need for more tests. Follow these instructions at home: Eating and drinking  Eat a diet that includes fresh fruits and vegetables, whole grains, lean protein, and low-fat dairy products. Take vitamin and mineral supplements as recommended by your health care provider. Do not drink alcohol if: Your health care provider tells you not to drink. You are pregnant, may be pregnant, or are planning to become pregnant. If you drink alcohol: Limit how much you have to 0-1 drink a day. Be aware of how much alcohol is in your drink. In the [place] one drink equals one 12 oz bottle of beer (355 mL), one 5 oz glass of wine (148 mL), or one 1 oz glass of hard liquor (44 mL).  Lifestyle Take daily care of your teeth and gums. Brush your teeth every morning and night with fluoride toothpaste. Floss one time each day. Stay active. Exercise for at least 30 minutes 5 or more days each week. Do not use any products that contain nicotine or tobacco, such as cigarettes, e-cigarettes, and chewing tobacco. If you need help quitting, ask your health care provider. Do not use drugs. If you are sexually active, practice safe sex. Use a condom or other form of protection to prevent STIs (sexually transmitted  infections). If you do not wish to become pregnant, use a form of birth control. If you plan to become pregnant, see your health care provider for a prepregnancy visit. If told by your health care provider, take low-dose aspirin daily starting at age 84. Find healthy ways to cope with stress, such as: Meditation, yoga, or listening to music. Journaling. Talking to a trusted person. Spending time with friends and family. Safety Always wear your seat belt while driving or riding in a vehicle. Do not drive: If you have been drinking alcohol. Do not ride with someone who has been drinking. When you are tired or distracted. While texting. Wear a helmet and other protective equipment during sports activities. If you have firearms in your house, make sure you follow all gun safety procedures. What's next? Visit your health care provider once a year for an annual wellness visit. Ask your health care provider how often you should have your eyes and teeth checked. Stay up to date on all vaccines. This information is not intended to  replace advice given to you by your health care provider. Make sure you discuss any questions you have with your healthcare provider. Document Revised: 12/21/2019 Document Reviewed: 11/27/2017 Elsevier Patient Education  [place] prevention discussed and literature given.   Our clinic's number is [phone]. Please call with questions or concerns about what we discussed today.

## 2020-09-21 ENCOUNTER — Encounter: Payer: Self-pay | Admitting: Family Medicine

## 2020-09-21 ENCOUNTER — Ambulatory Visit (HOSPITAL_COMMUNITY): Payer: Medicare Other | Attending: Cardiology

## 2020-09-21 ENCOUNTER — Telehealth: Payer: Self-pay | Admitting: Family Medicine

## 2020-09-21 ENCOUNTER — Other Ambulatory Visit: Payer: Self-pay

## 2020-09-21 DIAGNOSIS — R079 Chest pain, unspecified: Secondary | ICD-10-CM | POA: Insufficient documentation

## 2020-09-21 LAB — ECHOCARDIOGRAM COMPLETE
Area-P 1/2: 2.8 cm2
P 1/2 time: 518 msec
S' Lateral: 3.2 cm

## 2020-09-21 NOTE — Telephone Encounter (Signed)
Patient stopped by needs for handicap rail for her tub.  She has appt next Wednesday on 09/27/20.  Please give her call she states needs note for this

## 2020-09-22 ENCOUNTER — Encounter: Payer: Self-pay | Admitting: Cardiology

## 2020-09-22 DIAGNOSIS — I77810 Thoracic aortic ectasia: Secondary | ICD-10-CM | POA: Insufficient documentation

## 2020-09-22 NOTE — Telephone Encounter (Signed)
Discussed over mychart message and she will get a letter at her appointment.

## 2020-09-26 NOTE — Progress Notes (Signed)
     SUBJECTIVE:   CHIEF COMPLAINT / HPI:   T2DM with HLD Current regimen: Metformin 1000 mg twice daily Last A1c on 4/6 was 6.8 Last BMP on 6/3 was WNL On Lipitor, last lipid panel on 6/6 with LDL at goal, goal less than 70 Performed send out urine microalbumin on 6/3 which was WNL  Chest Pain F/U BP monitor WNL Stress test WNL EF 55-60% on Echo  Note for handicap rail Needs handicap rail for the tub because she struggles to bathe safely She also fell off the steps on her back porch that does not have a railing in January and broke her foot Asking for a letter for landlord to get this fixed She also has a driveway that is very uneven and dangerous for her to walk on  PERTINENT  PMH / PSH: Borderline ascending aorta dilatation, GERD, T2DM, hypothyroidism, schizoaffective disorder, HLD, anxiety and depression  OBJECTIVE:   BP 122/60   Pulse 74   Ht 5\' 8"  (1.727 m)   Wt 181 lb 3.2 oz (82.2 kg)   SpO2 97%   BMI 27.55 kg/m    Physical Exam:  General: 60 y.o. female in NAD Lungs: breathing comfortably on room air Skin: warm and dry Extremities: No edema, ambulating without difficulty   Results for orders placed or performed in visit on 09/27/20 (from the past 24 hour(s))  HgB A1c     Status: None   Collection Time: 09/27/20  8:39 AM  Result Value Ref Range   Hemoglobin A1C     HbA1c POC (<> result, manual entry)     HbA1c, POC (prediabetic range)     HbA1c, POC (controlled diabetic range) 6.6 0.0 - 7.0 %     ASSESSMENT/PLAN:   Diabetes mellitus without complication (HCC) A7G improved to 6.6.  Congratulated patient on this.  No diagnosis of hypertension, not on ACE or ARB.  Last microalbumin within normal limits.  Up-to-date on BMP.  Continue current regimen.  Atypical chest pain Now only having chest pain when she is very stressed.  She is doing her best to continue to work on this.  Testing with cardiology was within normal limits.  This is very reassuring.   Follow-up if changes in character.  Unsteadiness Patient had had a fall in January where she broke her foot after she fell off of her steps.  She does not currently have a rail on her steps.  She is also not have a rail in her bathtub and this can be very dangerous for her, as she lives alone and may fall.  She also has a very uneven driveway which could be dangerous for her to walk on as well.  Note provided to the patient to have her landlord take care of these issues.     Cleophas Dunker, [place]

## 2020-09-27 ENCOUNTER — Other Ambulatory Visit: Payer: Self-pay

## 2020-09-27 ENCOUNTER — Ambulatory Visit (INDEPENDENT_AMBULATORY_CARE_PROVIDER_SITE_OTHER): Payer: Medicare Other | Admitting: Family Medicine

## 2020-09-27 ENCOUNTER — Encounter: Payer: Self-pay | Admitting: Family Medicine

## 2020-09-27 VITALS — BP 122/60 | HR 74 | Ht 68.0 in | Wt 181.2 lb

## 2020-09-27 DIAGNOSIS — R2681 Unsteadiness on feet: Secondary | ICD-10-CM | POA: Insufficient documentation

## 2020-09-27 DIAGNOSIS — E119 Type 2 diabetes mellitus without complications: Secondary | ICD-10-CM | POA: Diagnosis not present

## 2020-09-27 DIAGNOSIS — R0789 Other chest pain: Secondary | ICD-10-CM | POA: Diagnosis not present

## 2020-09-27 LAB — POCT GLYCOSYLATED HEMOGLOBIN (HGB A1C): HbA1c, POC (controlled diabetic range): 6.6 % (ref 0.0–7.0)

## 2020-09-27 NOTE — Assessment & Plan Note (Signed)
Now only having chest pain when she is very stressed.  She is doing her best to continue to work on this.  Testing with cardiology was within normal limits.  This is very reassuring.  Follow-up if changes in character.

## 2020-09-27 NOTE — Assessment & Plan Note (Signed)
Patient had had a fall in January where she broke her foot after she fell off of her steps.  She does not currently have a rail on her steps.  She is also not have a rail in her bathtub and this can be very dangerous for her, as she lives alone and may fall.  She also has a very uneven driveway which could be dangerous for her to walk on as well.  Note provided to the patient to have her landlord take care of these issues.

## 2020-09-27 NOTE — Patient Instructions (Addendum)
Thank you for coming to see me today. It was a pleasure. Today we talked about:   [place] job with your A1c - 6.6!!!!  Everything else looks good!  If you need anything else with the note, please let us know!  Please follow-up with your new PCP in 3 months.  If you have any questions or concerns, please do not hesitate to call the office at [phone].  Best,   [place] Constable, DO

## 2020-09-27 NOTE — Assessment & Plan Note (Signed)
A1c improved to 6.6.  Congratulated patient on this.  No diagnosis of hypertension, not on ACE or ARB.  Last microalbumin within normal limits.  Up-to-date on BMP.  Continue current regimen.

## 2020-10-06 ENCOUNTER — Ambulatory Visit: Payer: Medicare Other | Admitting: Internal Medicine

## 2020-10-17 DIAGNOSIS — Z20822 Contact with and (suspected) exposure to covid-19: Secondary | ICD-10-CM | POA: Diagnosis not present

## 2020-10-30 ENCOUNTER — Encounter: Payer: Self-pay | Admitting: Family Medicine

## 2020-10-30 ENCOUNTER — Other Ambulatory Visit: Payer: Self-pay

## 2020-10-30 ENCOUNTER — Ambulatory Visit (INDEPENDENT_AMBULATORY_CARE_PROVIDER_SITE_OTHER): Payer: Medicare Other | Admitting: Family Medicine

## 2020-10-30 DIAGNOSIS — M25532 Pain in left wrist: Secondary | ICD-10-CM | POA: Diagnosis not present

## 2020-10-30 DIAGNOSIS — Z59819 Housing instability, housed unspecified: Secondary | ICD-10-CM

## 2020-10-30 DIAGNOSIS — Z599 Problem related to housing and economic circumstances, unspecified: Secondary | ICD-10-CM | POA: Diagnosis not present
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
## 2020-10-30 NOTE — Patient Instructions (Addendum)
It was great seeing you today!   As discusssed, For your left wrist pain:  Take 400 mg ibuprofen 3 times a day. You can also take 650 mg of Tylenol for additional pain releif.  Stop by the [facility] to pick up a wrist brace with thumb spica (L wrist)  Follow up with me in 1-2 weeks if pain is not improved.      Take care,   [place]

## 2020-10-31 ENCOUNTER — Telehealth: Payer: Self-pay | Admitting: Student

## 2020-10-31 NOTE — Telephone Encounter (Signed)
   Telephone encounter was:  Successful.  10/31/2020 Name: [name] MRN: [id number] DOB: Jul 24, 1960  Lori Jones is a 60 y.o. year old female who is a primary care patient of Gerrit Heck, MD . The community resource team was consulted for assistance with  housing.   Care guide performed the following interventions: Patient provided with information about care guide support team and interviewed to confirm resource needs Patient confirmed she was being evicted and needs to be out by 8/31. I advised I would research housing resources and email her .  Follow Up Plan:   I will contact patient if I need additional information , she also has my number to contact me if something comes up or changes.  April Green Care Guide, Embedded Care Coordination [place], Care Management Phone: [phone] Email: [email or web address]

## 2020-11-01 NOTE — Assessment & Plan Note (Signed)
Reviewed ED notes and previous imaging. No fracture noted.  DDX: de quervain's tenosynovitis, carpal tunnel, ligamentous sprain, carpal dislocation. Doubt scaphoid fracture. Trial NSAIDs and left wrist brace with thumb spica worn 24/7 unless showering or utilizing water. Follow up in office in 1-2 weeks if not improving.

## 2020-11-02 ENCOUNTER — Other Ambulatory Visit: Payer: Self-pay | Admitting: Family Medicine

## 2020-11-08 DIAGNOSIS — Z20822 Contact with and (suspected) exposure to covid-19: Secondary | ICD-10-CM | POA: Diagnosis not present

## 2020-11-28 ENCOUNTER — Ambulatory Visit (INDEPENDENT_AMBULATORY_CARE_PROVIDER_SITE_OTHER): Payer: Medicare Other | Admitting: Family Medicine

## 2020-11-28 ENCOUNTER — Other Ambulatory Visit: Payer: Self-pay

## 2020-11-28 VITALS — BP 112/64 | HR 73 | Wt 178.2 lb

## 2020-11-28 DIAGNOSIS — R1031 Right lower quadrant pain: Secondary | ICD-10-CM | POA: Diagnosis not present

## 2020-11-28 DIAGNOSIS — M25551 Pain in right hip: Secondary | ICD-10-CM | POA: Diagnosis not present

## 2020-11-28 DIAGNOSIS — Q632 Ectopic kidney: Secondary | ICD-10-CM | POA: Diagnosis not present

## 2020-11-28 LAB — POCT UA - MICROSCOPIC ONLY

## 2020-11-28 LAB — POCT URINALYSIS DIP (MANUAL ENTRY)
Bilirubin, UA: NEGATIVE
Blood, UA: NEGATIVE
Glucose, UA: NEGATIVE mg/dL
Ketones, POC UA: NEGATIVE mg/dL
Leukocytes, UA: NEGATIVE
Nitrite, UA: NEGATIVE
Spec Grav, UA: >=1.03 — AB (ref 1.010–1.025)
Urobilinogen, UA: 0.2 E.U./dL
pH, UA: 5.5 (ref 5.0–8.0)

## 2020-11-28 NOTE — Patient Instructions (Addendum)
It was wonderful to meet you today. Thank you for allowing me to be a part of your care. Below is a short summary of what we discussed at your visit today:  Hip and groin pain -Today we tested a urine sample, there was no evidence of a urinary tract infection (UTI)  - I am sending for x-rays of your hip to evaluate for possible osteoarthritis - You may use Tylenol for regular pain - You may use ibuprofen for breakthrough pain - Call to let us know if the pain is not improving or getting worse     Please bring all of your medications to every appointment!  If you have any questions or concerns, please do not hesitate to contact us via phone or MyChart message.   Ezequiel Essex, MD

## 2020-11-28 NOTE — Progress Notes (Signed)
    SUBJECTIVE:   CHIEF COMPLAINT / HPI:   Right groin pain - Started yesterday while walking at the grocery store - Sharp quality, worse when walking - Better with rest - Pain of right hip, RLQ, groin - History of extensive osteoarthritis in other joints including knees and hands - Does not member if she has ever had her knees imaged - No painful rashes or lesions in the area - No fever, chills, fatigue  PERTINENT  PMH / PSH: Dilation of ascending aorta, GERD, T2DM, hypothyroidism, anxiety depression, schizoaffective disorder, HLD, bilateral pelvic kidneys  OBJECTIVE:   BP 112/64   Pulse 73   Wt 178 lb 3.2 oz (80.8 kg)   SpO2 98%   BMI 27.10 kg/m    PHQ-9:  Depression screen [facility] 2/9 11/28/2020 10/30/2020 09/27/2020  Decreased Interest 0 0 0  Down, Depressed, Hopeless 0 0 0  PHQ - 2 Score 0 0 0  Altered sleeping 0 0 0  Tired, decreased energy 0 0 0  Change in appetite 0 0 0  Feeling bad or failure about yourself  0 0 0  Trouble concentrating 0 0 0  Moving slowly or fidgety/restless 0 0 0  Suicidal thoughts 0 0 0  PHQ-9 Score 0 0 0  Difficult doing work/chores Not difficult at all Not difficult at all -  Some recent data might be hidden     GAD-7:  GAD 7 : Generalized Anxiety Score 05/19/2018  Nervous, Anxious, on Edge 1  Control/stop worrying 0  Worry too much - different things 1  Trouble relaxing 0  Restless 0  Easily annoyed or irritable 0  Afraid - awful might happen 1  Total GAD 7 Score 3   Physical Exam General: Awake, alert, oriented Cardiovascular: Regular rate and rhythm, S1 and S2 present, no murmurs auscultated Respiratory: Lung fields clear to auscultation bilaterally Extremities: No bilateral lower extremity edema, palpable pedal and pretibial pulses bilaterally Abdominal: Moderate TTP over RLQ and suprapubic, no rebound tenderness MSK: Moderate TTP over right greater trochanter, moderate TTP over right inguinal canal, no pain with passive straight  leg raise, no pain with passive internal/external rotation, positive Faber's test  ASSESSMENT/PLAN:   Right hip pain Recent onset, worse with standing and walking, improved with rest.  Limited to groin and hip, no radiation.  Extensive arthritis in other joints.  Positive Faber's test.  Performed UA with micro out of caution for very unlikely UTI in setting of bilateral pelvic kidneys (the right one of which is quite close to right hip joint); thankfully UA negative for infection.  Believe this to be primary osteoarthritis of her right hip.   - Complete series right hip x-ray - Tylenol as needed for pain first-line - Ibuprofen for breakthrough pain - Return precautions discussed     Ezequiel Essex, MD [place]

## 2020-11-29 ENCOUNTER — Encounter: Payer: Self-pay | Admitting: Family Medicine

## 2020-11-29 DIAGNOSIS — Q632 Ectopic kidney: Secondary | ICD-10-CM | POA: Insufficient documentation

## 2020-11-29 DIAGNOSIS — M25551 Pain in right hip: Secondary | ICD-10-CM | POA: Insufficient documentation

## 2020-11-29 NOTE — Assessment & Plan Note (Signed)
Recent onset, worse with standing and walking, improved with rest.  Limited to groin and hip, no radiation.  Extensive arthritis in other joints.  Positive Faber's test.  Performed UA with micro out of caution for very unlikely UTI in setting of bilateral pelvic kidneys (the right one of which is quite close to right hip joint); thankfully UA negative for infection.  Believe this to be primary osteoarthritis of her right hip.   - Complete series right hip x-ray - Tylenol as needed for pain first-line - Ibuprofen for breakthrough pain - Return precautions discussed

## 2021-01-29 DIAGNOSIS — R29890 Loss of height: Secondary | ICD-10-CM | POA: Diagnosis not present

## 2021-01-29 DIAGNOSIS — E039 Hypothyroidism, unspecified: Secondary | ICD-10-CM | POA: Diagnosis not present

## 2021-01-31 ENCOUNTER — Other Ambulatory Visit: Payer: Self-pay | Admitting: Family Medicine

## 2021-01-31 DIAGNOSIS — H401131 Primary open-angle glaucoma, bilateral, mild stage: Secondary | ICD-10-CM | POA: Diagnosis not present

## 2021-01-31 DIAGNOSIS — H25813 Combined forms of age-related cataract, bilateral: Secondary | ICD-10-CM | POA: Diagnosis not present

## 2021-01-31 DIAGNOSIS — E119 Type 2 diabetes mellitus without complications: Secondary | ICD-10-CM | POA: Diagnosis not present

## 2021-01-31 DIAGNOSIS — E089 Diabetes mellitus due to underlying condition without complications: Secondary | ICD-10-CM

## 2021-01-31 LAB — HM DIABETES EYE EXAM

## 2021-02-01 ENCOUNTER — Ambulatory Visit (INDEPENDENT_AMBULATORY_CARE_PROVIDER_SITE_OTHER): Payer: Medicare Other | Admitting: Family Medicine

## 2021-02-01 ENCOUNTER — Other Ambulatory Visit: Payer: Self-pay

## 2021-02-01 ENCOUNTER — Ambulatory Visit
Admission: RE | Admit: 2021-02-01 | Discharge: 2021-02-01 | Disposition: A | Discharge status: Home / Self Care | Payer: Medicare Other | Source: Ambulatory Visit | Attending: Family Medicine | Admitting: Family Medicine

## 2021-02-01 VITALS — BP 132/77 | HR 74 | Wt 182.2 lb

## 2021-02-01 DIAGNOSIS — M25532 Pain in left wrist: Secondary | ICD-10-CM

## 2021-02-01 MED ORDER — NAPROXEN 500 MG PO TABS: 500.0000 mg | ORAL_TABLET | ORAL | Status: DC | PRN
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
## 2021-02-01 NOTE — Patient Instructions (Signed)
It was great seeing you today!    As discusssed, For your left wrist pain:  Stop by the pharmacy to pick up your pain relieving medication.  You can also take 650 mg of Tylenol for additional pain relief.   Use Voltaren gel as needed for pain of wrist.  Stop by the [facility] to pick up a wrist brace with thumb spica (L wrist)  Follow up with me in 2 weeks if pain is not improved.        Take care,    [place]

## 2021-02-08 ENCOUNTER — Other Ambulatory Visit: Payer: Self-pay

## 2021-02-08 ENCOUNTER — Ambulatory Visit (INDEPENDENT_AMBULATORY_CARE_PROVIDER_SITE_OTHER): Payer: Medicare Other | Admitting: Student

## 2021-02-08 ENCOUNTER — Encounter: Payer: Self-pay | Admitting: Student

## 2021-02-08 VITALS — BP 132/64 | HR 66 | Ht 68.0 in | Wt 182.2 lb

## 2021-02-08 DIAGNOSIS — M25532 Pain in left wrist: Secondary | ICD-10-CM | POA: Diagnosis not present

## 2021-02-08 DIAGNOSIS — E119 Type 2 diabetes mellitus without complications: Secondary | ICD-10-CM

## 2021-02-08 LAB — POCT GLYCOSYLATED HEMOGLOBIN (HGB A1C): HbA1c, POC (controlled diabetic range): 7 % (ref 0.0–7.0)
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
## 2021-02-08 NOTE — Patient Instructions (Addendum)
It was great to see you! Thank you for allowing me to participate in your care!   I recommend that you always bring your medications to each appointment as this makes it easy to ensure we are on the correct medications and helps [place] not miss when refills are needed.  Our plans for today:  - I am going to refer you to sports medicine to see if an injection may help with the wrist pain -I put in a referral to nutrition with Dr. Jenne Campus for you. Please call her number to set up an appointment with her on the business card I provided.  -call your eye doctor to send your vision screening to our clinic!   Take care and seek immediate care sooner if you develop any concerns. Please remember to show up 15 minutes before your scheduled appointment time!  Gerrit Heck, MD [place]

## 2021-02-10 ENCOUNTER — Encounter: Payer: Self-pay | Admitting: Student

## 2021-02-10 NOTE — Assessment & Plan Note (Addendum)
Tylenol 650 as needed.  Prescribed Naprosyn 500 twice daily.  Voltaren gel as needed.  Left thumb spica wrist brace to be worn 24/7 except when showering and washing dishes.  Follow-up in 2 weeks if not improved.  Consider wrist ultrasound at the sports medicine center. -Wrist x-ray

## 2021-02-10 NOTE — Assessment & Plan Note (Signed)
A1c slightly up to 7.0 today from 6.6 at last visit. Patient expressed interest in touching base with nutritionist in office. Foot exam no abnormalities, spoke about no barefoot walking and checking feet often. -Provided referral and Dr. Jenne Campus number to set up nutrition appointment -Continue metformin 1000 mg BID

## 2021-02-10 NOTE — Assessment & Plan Note (Signed)
Xray normal. DeQuervain tenosynovitis possible given exam.  -Sports medicine referral for potential injection/management

## 2021-02-11 ENCOUNTER — Encounter: Payer: Self-pay | Admitting: Family Medicine

## 2021-02-13 ENCOUNTER — Ambulatory Visit (INDEPENDENT_AMBULATORY_CARE_PROVIDER_SITE_OTHER): Payer: Medicare Other | Admitting: Sports Medicine

## 2021-02-13 VITALS — BP 128/80 | Ht 68.0 in | Wt 182.0 lb

## 2021-02-13 DIAGNOSIS — M25532 Pain in left wrist: Secondary | ICD-10-CM | POA: Diagnosis not present
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
## 2021-02-15 NOTE — Patient Instructions (Signed)
[facility] Dr Caralyn Guile 03/13/21 @ [address] [place] [phone]

## 2021-03-01 ENCOUNTER — Ambulatory Visit: Payer: Medicare Other | Admitting: Family Medicine

## 2021-03-07 ENCOUNTER — Encounter: Payer: Self-pay | Admitting: Family Medicine

## 2021-03-07 ENCOUNTER — Ambulatory Visit (INDEPENDENT_AMBULATORY_CARE_PROVIDER_SITE_OTHER): Payer: Medicare Other | Admitting: Family Medicine

## 2021-03-07 ENCOUNTER — Other Ambulatory Visit: Payer: Self-pay

## 2021-03-07 DIAGNOSIS — M25532 Pain in left wrist: Secondary | ICD-10-CM

## 2021-03-07 NOTE — Patient Instructions (Signed)
It was great seeing you today! Keep your appointment with Dr. Caralyn Guile next week. Take the naproxen sodium for pain as needed.   As discusssed, get [facility], Dr. Lynnette Caffey, Dr. Caralyn Guile to send your records.     Take care,   [place]
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
## 2021-03-07 NOTE — Assessment & Plan Note (Addendum)
Acute on Chronic. Continue Tylenol and Alleve as time NSAID appears to help her best. Topical Voltaren gel as needed.  Continue using left wrist brace . Previous xray was unremarkable. Reviewed notes from [facility] who referred her to orhtopedic surgery.  Pt has an appointment with Dr. Caralyn Guile with [facility] on 12/13.

## 2021-03-13 DIAGNOSIS — M25532 Pain in left wrist: Secondary | ICD-10-CM | POA: Diagnosis not present

## 2021-03-21 ENCOUNTER — Ambulatory Visit
Admission: RE | Admit: 2021-03-21 | Discharge: 2021-03-21 | Disposition: A | Discharge status: Home / Self Care | Payer: Medicare Other | Source: Ambulatory Visit | Attending: Family Medicine | Admitting: Family Medicine

## 2021-03-21 ENCOUNTER — Other Ambulatory Visit: Payer: Self-pay

## 2021-03-21 ENCOUNTER — Encounter: Payer: Self-pay | Admitting: Family Medicine

## 2021-03-21 ENCOUNTER — Ambulatory Visit (INDEPENDENT_AMBULATORY_CARE_PROVIDER_SITE_OTHER): Payer: Medicare Other | Admitting: Family Medicine

## 2021-03-21 VITALS — BP 142/72 | HR 84 | Ht 68.0 in | Wt 184.0 lb

## 2021-03-21 DIAGNOSIS — M5442 Lumbago with sciatica, left side: Secondary | ICD-10-CM

## 2021-03-21 DIAGNOSIS — M545 Low back pain, unspecified: Secondary | ICD-10-CM | POA: Diagnosis not present

## 2021-03-21 DIAGNOSIS — G8929 Other chronic pain: Secondary | ICD-10-CM | POA: Diagnosis not present

## 2021-03-21 DIAGNOSIS — M5441 Lumbago with sciatica, right side: Secondary | ICD-10-CM | POA: Diagnosis not present
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
## 2021-03-21 NOTE — Patient Instructions (Addendum)
It was nice seeing you today!  Get your X-ray.  [facility] Address: [address], [place], [place] [address] Phone: [phone]   They will call you about physical therapy appointment.  Follow-up in about 6 weeks.  Please arrive at least 15 minutes prior to your scheduled appointments.  Stay well, Zola Button, MD [place] [phone]

## 2021-03-27 DIAGNOSIS — M25532 Pain in left wrist: Secondary | ICD-10-CM | POA: Diagnosis not present

## 2021-04-05 DIAGNOSIS — M25532 Pain in left wrist: Secondary | ICD-10-CM | POA: Diagnosis not present

## 2021-04-27 DIAGNOSIS — G5602 Carpal tunnel syndrome, left upper limb: Secondary | ICD-10-CM | POA: Diagnosis not present

## 2021-04-30 ENCOUNTER — Ambulatory Visit (INDEPENDENT_AMBULATORY_CARE_PROVIDER_SITE_OTHER): Payer: Medicare Other | Admitting: Family Medicine

## 2021-04-30 ENCOUNTER — Other Ambulatory Visit: Payer: Self-pay

## 2021-04-30 ENCOUNTER — Encounter: Payer: Self-pay | Admitting: Family Medicine

## 2021-04-30 ENCOUNTER — Ambulatory Visit (INDEPENDENT_AMBULATORY_CARE_PROVIDER_SITE_OTHER): Payer: Medicare Other

## 2021-04-30 VITALS — BP 132/69 | HR 76 | Wt 185.8 lb

## 2021-04-30 DIAGNOSIS — M25532 Pain in left wrist: Secondary | ICD-10-CM | POA: Diagnosis not present

## 2021-04-30 DIAGNOSIS — M549 Dorsalgia, unspecified: Secondary | ICD-10-CM | POA: Diagnosis not present

## 2021-04-30 DIAGNOSIS — Z23 Encounter for immunization: Secondary | ICD-10-CM | POA: Diagnosis not present

## 2021-04-30 DIAGNOSIS — M4120 Other idiopathic scoliosis, site unspecified: Secondary | ICD-10-CM | POA: Diagnosis not present

## 2021-04-30 DIAGNOSIS — G8929 Other chronic pain: Secondary | ICD-10-CM

## 2021-04-30 NOTE — Patient Instructions (Signed)
Continue taking Alleve and Ibuprofen as needed for your back and wrist pain.  Continue performing your home exercises. It is important that your continue moving.  Follow up with Dr. Caralyn Guile as scheduled   Take Care,  Dr Susa Simmonds
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
## 2021-05-03 ENCOUNTER — Ambulatory Visit (INDEPENDENT_AMBULATORY_CARE_PROVIDER_SITE_OTHER): Payer: Medicare Other | Admitting: Family Medicine

## 2021-05-03 ENCOUNTER — Encounter: Payer: Self-pay | Admitting: Family Medicine

## 2021-05-03 ENCOUNTER — Other Ambulatory Visit: Payer: Self-pay

## 2021-05-03 DIAGNOSIS — G8929 Other chronic pain: Secondary | ICD-10-CM | POA: Diagnosis not present

## 2021-05-03 DIAGNOSIS — G5602 Carpal tunnel syndrome, left upper limb: Secondary | ICD-10-CM | POA: Diagnosis not present

## 2021-05-03 DIAGNOSIS — M549 Dorsalgia, unspecified: Secondary | ICD-10-CM | POA: Diagnosis not present

## 2021-05-03 DIAGNOSIS — M5416 Radiculopathy, lumbar region: Secondary | ICD-10-CM | POA: Diagnosis not present

## 2021-05-03 DIAGNOSIS — M5459 Other low back pain: Secondary | ICD-10-CM | POA: Diagnosis not present

## 2021-05-03 MED ORDER — GABAPENTIN 100 MG PO CAPS: 100.0000 mg | ORAL_CAPSULE | Freq: Three times a day (TID) | ORAL | 0 refills | Status: DC

## 2021-05-03 MED ORDER — BACLOFEN 10 MG PO TABS: 10.0000 mg | ORAL_TABLET | Freq: Three times a day (TID) | ORAL | 0 refills | Status: DC

## 2021-05-03 NOTE — Assessment & Plan Note (Signed)
Acute on chronic.  Does not normallly have radiculopathy.  Now has right sided radiculopathy. Rx baclofen (less sedating muscle relaxer) Gabapentin for new neuropathy pain, radiculopathy PT. FU 3 weeks to reevaluate.

## 2021-05-03 NOTE — Patient Instructions (Signed)
Someone should call to set up the physical therapy. I sent in two medications.  Please try them one at time Baclofen is a muscle relaxer.  It should help with the bad spasm Gabapentin is a nerve pain medicine.  I would expect it to help with your leg pain, which I believe is causes by pinched nerves.   Let us see you again in 2-3 weeks.  You can tell me how the physical therapy is doing and whether one or both of the medicines are good for you.

## 2021-05-03 NOTE — Assessment & Plan Note (Signed)
Stable.  Following with orthopedic surgery, Dr. Apolonio Schneiders, at [facility] who is planning carpal tunnel surgery.  Continue OTC analgesics.
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
## 2021-05-03 NOTE — Progress Notes (Signed)
° ° °  SUBJECTIVE:   CHIEF COMPLAINT / HPI:   Acute on chronic low back pain.  Patient with longstanding lumbar back pain attributed to her idiopathic scoliosis.  Has previously been on narcotics, tramadol and flexeril.  Lately, she has done quite well on OTC meds including topical patch.  Two days ago, bent over to pick up something and had the sudden onset of severe right sided back pain with right leg radiculopathy.  C/o pins and needles sensation in right leg, but no weakness.  No bowel or bladder symptoms.  Leg pain is new over past 2 days.  She has xray image of her back on her phone, which confirms marked lumbar scoliosis.  Also seen on our ls spine films of 03/22/21  She does not like flexeril because it makes her too sleepy.  Has not previously been on gabapentin.  Has previously responded well to PT treatment.    OBJECTIVE:   BP (!) 154/81    Pulse 74    Ht 5\' 8"  (1.727 m)    SpO2 100%    BMI 28.25 kg/m   Scoliosis of lumbar spine.  Marked right sided paraspinous muscle tenderness.   No weakness of right leg  ASSESSMENT/PLAN:   Chronic back pain Acute on chronic.  Does not normallly have radiculopathy.  Now has right sided radiculopathy. Rx baclofen (less sedating muscle relaxer) Gabapentin for new neuropathy pain, radiculopathy PT. FU 3 weeks to reevaluate.       Zenia Resides, MD [place]

## 2021-05-04 ENCOUNTER — Other Ambulatory Visit: Payer: Self-pay

## 2021-05-04 ENCOUNTER — Emergency Department (HOSPITAL_COMMUNITY)
Admission: EM | Admit: 2021-05-04 | Discharge: 2021-05-04 | Disposition: A | Discharge status: Home / Self Care | Payer: Medicare Other | Attending: Emergency Medicine | Admitting: Emergency Medicine

## 2021-05-04 ENCOUNTER — Emergency Department (HOSPITAL_COMMUNITY): Payer: Medicare Other

## 2021-05-04 ENCOUNTER — Encounter (HOSPITAL_COMMUNITY): Payer: Self-pay

## 2021-05-04 DIAGNOSIS — Z7982 Long term (current) use of aspirin: Secondary | ICD-10-CM | POA: Diagnosis not present

## 2021-05-04 DIAGNOSIS — E119 Type 2 diabetes mellitus without complications: Secondary | ICD-10-CM | POA: Insufficient documentation

## 2021-05-04 DIAGNOSIS — Z7984 Long term (current) use of oral hypoglycemic drugs: Secondary | ICD-10-CM | POA: Insufficient documentation

## 2021-05-04 DIAGNOSIS — Z79899 Other long term (current) drug therapy: Secondary | ICD-10-CM | POA: Diagnosis not present

## 2021-05-04 DIAGNOSIS — G8929 Other chronic pain: Secondary | ICD-10-CM

## 2021-05-04 DIAGNOSIS — M5431 Sciatica, right side: Secondary | ICD-10-CM | POA: Diagnosis not present

## 2021-05-04 DIAGNOSIS — M5441 Lumbago with sciatica, right side: Secondary | ICD-10-CM

## 2021-05-04 DIAGNOSIS — M79604 Pain in right leg: Secondary | ICD-10-CM | POA: Diagnosis present

## 2021-05-04 DIAGNOSIS — M545 Low back pain, unspecified: Secondary | ICD-10-CM | POA: Diagnosis not present

## 2021-05-04 MED ORDER — HYDROMORPHONE HCL 1 MG/ML IJ SOLN
1.0000 mg | Freq: Once | INTRAMUSCULAR | Status: AC
  Administered 2021-05-04: 1 mg via INTRAMUSCULAR
  Filled 2021-05-04: qty 1

## 2021-05-04 MED ORDER — METHYLPREDNISOLONE 4 MG PO TBPK: ORAL_TABLET | Freq: Four times a day (QID) | ORAL | 0 refills | Status: AC

## 2021-05-04 MED ORDER — DEXAMETHASONE SODIUM PHOSPHATE 10 MG/ML IJ SOLN
10.0000 mg | Freq: Once | INTRAMUSCULAR | Status: AC
  Administered 2021-05-04: 10 mg via INTRAMUSCULAR
  Filled 2021-05-04: qty 1

## 2021-05-04 MED ORDER — HYDROMORPHONE HCL 1 MG/ML IJ SOLN
1.0000 mg | Freq: Once | INTRAMUSCULAR | Status: AC
  Administered 2021-05-04: 1 mg via INTRAVENOUS
  Filled 2021-05-04: qty 1

## 2021-05-04 NOTE — ED Notes (Signed)
Pt requesting to speak with someone, upon entering pt upset over wait time and proceeded to read the [employer] reviews to writer, While trying to explain to pt her care is important Leon PA entered to assess pt. At this time writer stepped out of the room.

## 2021-05-04 NOTE — ED Provider Notes (Signed)
[facility] DEPT Provider Note   CSN: [id number] Arrival date & time: 05/04/21  1034     History Chief Complaint  Patient presents with   Leg Pain    Lori Jones is a 61 y.o. female with h/o chronic back pain, DM2, schizoaffective disorder, HLD, and idiopathic scoliosis presents to the ED for evaluation of lower right back pain for the past 2 days. The patient reports that two days ago, she bent down to pick up a pen when she felt the pain in her lower right back. She reports that initially it hurt her back to walk, but since today she has had radiation of pain into her thigh.  She denies any weakness in the leg.  She reports she has this pins-and-needles feeling on the anterior right thigh into the lower back.  She denies any IV drug use ever.  She denies any urinary retention, fecal incontinence, urinary incontinence.  She was seen by her PCP yesterday and given gabapentin and baclofen.  She reports she was only able to take 1 dose of these, but did not find any relief.  She was seen by her orthopedic doctor for her hand after her PCP and was given Percocet.  The patient reports she was only able to have 1 dose of this and did not find any relief of her pain from this as well.   Leg Pain Associated symptoms: back pain   Associated symptoms: no fever and no neck pain       Home Medications Prior to Admission medications   Medication Sig Start Date End Date Taking? Authorizing Provider  aspirin EC 81 MG tablet Take 1 tablet (81 mg total) by mouth daily. 05/09/17   Carlyle Dolly, MD  atorvastatin (LIPITOR) 40 MG tablet Take 1 tablet (40 mg total) by mouth daily. 07/10/20   Patriciaann Clan, DO  baclofen (LIORESAL) 10 MG tablet Take 1 tablet (10 mg total) by mouth 3 (three) times daily. 05/03/21   Zenia Resides, MD  escitalopram (LEXAPRO) 10 MG tablet Take 1 tablet (10 mg total) by mouth daily. 07/10/20   Patriciaann Clan, DO  gabapentin (NEURONTIN)  100 MG capsule Take 1 capsule (100 mg total) by mouth 3 (three) times daily. 05/03/21   Zenia Resides, MD  levothyroxine (SYNTHROID) 25 MCG tablet TAKE 1 TABLET(25 MCG) BY MOUTH DAILY BEFORE BREAKFAST 11/04/20   Gerrit Heck, MD  metFORMIN (GLUCOPHAGE) 1000 MG tablet Take 1 tablet (1,000 mg total) by mouth 2 (two) times daily with a meal. 02/01/21   Gerrit Heck, MD  naproxen (NAPROSYN) 500 MG tablet Take 1 tablet (500 mg total) by mouth as needed. 02/01/21   [name], DO      Allergies    Clarithromycin, Sumatriptan, Canagliflozin, Canagliflozin-metformin hcl, Chlorhexidine, and Sumatriptan succinate    Review of Systems   Review of Systems  Constitutional:  Negative for chills and fever.  Respiratory:  Negative for shortness of breath.   Cardiovascular:  Negative for chest pain and palpitations.  Genitourinary:  Negative for decreased urine volume, difficulty urinating, dysuria, enuresis, flank pain and hematuria.  Musculoskeletal:  Positive for back pain and myalgias. Negative for joint swelling, neck pain and neck stiffness.   Physical Exam Updated Vital Signs BP 123/79    Pulse 66    Resp 16    Ht 5\' 8"  (1.727 m)    Wt 83.9 kg    SpO2 98%    BMI 28.13 kg/m  Physical Exam Vitals and nursing note reviewed.  Constitutional:      Appearance: Normal appearance. She is not toxic-appearing.     Comments: Tearful, uncomfortable appearing  Eyes:     General: No scleral icterus. Pulmonary:     Effort: Pulmonary effort is normal. No respiratory distress.  Musculoskeletal:        General: Tenderness present. No signs of injury.     Right lower leg: No edema.     Left lower leg: No edema.     Comments: No midline cervical or thoracic tenderness palpation.  Mild midline lumbar tenderness palpation.  Significant tenderness over the right paraspinal lumbar area.  No tenderness over the gluteus.  Nontender right leg.  DP and PT pulses palpable.  Compartments are soft.  Sensation is  increased on the right.  Patient refused walking for me to assess her gait.  After pain medication, patient is able to lie on her back.  She has a negative straight leg raise, but positive contralateral straight leg raise.  Skin:    General: Skin is dry.     Findings: No rash.  Neurological:     General: No focal deficit present.     Mental Status: She is alert. Mental status is at baseline.  Psychiatric:        Mood and Affect: Mood normal.    ED Results / Procedures / Treatments   Labs (all labs ordered are listed, but only abnormal results are displayed) Labs Reviewed - No data to display  EKG None  Radiology No results found.  Procedures Procedures   Medications Ordered in ED Medications - No data to display  ED Course/ Medical Decision Making/ A&P                           Medical Decision Making Amount and/or Complexity of Data Reviewed Radiology: ordered.  Risk Prescription drug management.   61 year old female presents emerged department for evaluation of back pain.  Differential diagnosis includes was not limited to radiculopathy, sciatica, DDD, cauda equina, epidural abscess, fracture, subluxation.  Low suspicion for epidural abscess as the patient is denies any IV drug use ever, there is a event that happened when the back pain took place.  Low suspicion for cauda equina as patient does not have any urinary or fecal symptoms.  She does not have any weakness in her lower legs.  Vital signs stable.  Patient normotensive, normal pulse rate, satting well on room air.  Physical exam pertinent for significant tenderness over the right lower paraspinal lumbar area.  No overlying skin changes noted.  No palpable deformity near step-off noted.  Patient has a negative straight leg raise, but positive contralateral straight leg raise.  Given the patient's significant lumbar scoliosis seen on previous x-ray on 03/21/21, and her pain, will order MRI to rule out any spinal cord  compression.  The patient refuses ambulation.   1 mg of Dilaudid ordered in the emergency department.  Pending MRI lumbar.  I suspect sciatica,  3:34 PM Care of CHRYL HOLTEN transferred to PA Theodis Blaze at the end of my shift as the patient will require reassessment once labs/imaging have resulted. Patient presentation, ED course, and plan of care discussed with review of all pertinent labs and imaging. Please see his/her note for further details regarding further ED course and disposition. Plan at time of handoff is follow up with the MRI. If normal, ambulate the patient  and possibly send home on a short course of prednisone and to continue on the medications given by her PCP. This may be altered or completely changed at the discretion of the oncoming team pending results of further workup.  Final Clinical Impression(s) / ED Diagnoses Final diagnoses:  None    Rx / DC Orders ED Discharge Orders     None         Sherrell Puller, PA-C 05/04/21 1552    Hayden Rasmussen, MD 05/04/21 1820

## 2021-05-04 NOTE — ED Notes (Signed)
Patient ambulated with out assistance from her room to the hall bathroom. It did seems as though she had slight difficulty. She had to use the wheelchair back to her room.

## 2021-05-04 NOTE — Discharge Instructions (Addendum)
You were seen in the emergency department today for low back pain. While you were here we performed an MRI which shows that you do have some degenerative changes to your back and some compression of your nerves which is likely causing you pain. I spoke with the neurosurgery physician who recommends steroids. I have prescribed these to you. Please call Dr. Annette Stable, a neurosurgeon tomorrow to schedule an appointment in his clinic for ongoing management. Please return if you are unable to walk or control your bladder or bowel movements.

## 2021-05-04 NOTE — ED Provider Notes (Signed)
Care of patient handed off to me at this time from [facility] [place]. Please see her note for full history of present illness, PE as well as work initiated. Briefly, this is a 61 year old female with past medical history of chronic low back pain, T2DM, who present to the emergency department with acute episode of low back pain after bending over to pick up a pencil on Wednesday. Endorses pain in right lower back, as well as pain with lifting her legs. She denies weakness of her lower extremities, saddle anesthesia, incontinence/retention of bowel/bladder or fevers.  Physical Exam  BP 140/82    Pulse 71    Resp 16    Ht 5\' 8"  (1.727 m)    Wt 83.9 kg    SpO2 97%    BMI 28.13 kg/m   Physical Exam Vitals and nursing note reviewed.  Constitutional:      General: She is not in acute distress.    Appearance: Normal appearance. She is not ill-appearing or toxic-appearing.  HENT:     Head: Normocephalic and atraumatic.  Eyes:     General: No scleral icterus. Pulmonary:     Effort: Pulmonary effort is normal. No respiratory distress.  Musculoskeletal:        General: Normal range of motion.     Lumbar back: Tenderness present.       Back:  Skin:    General: Skin is warm and dry.     Capillary Refill: Capillary refill takes less than 2 seconds.     Findings: No rash.  Neurological:     General: No focal deficit present.     Mental Status: She is alert and oriented to person, place, and time. Mental status is at baseline.     Cranial Nerves: No cranial nerve deficit.     Motor: No weakness.  Psychiatric:        Mood and Affect: Mood normal.        Behavior: Behavior normal.        Thought Content: Thought content normal.        Judgment: Judgment normal.   Please see primary provider full PE  Procedures  Procedures  ED Course / MDM    Medical Decision Making Amount and/or Complexity of Data Reviewed Radiology: ordered.  Risk Prescription drug management.  Patient presents to the  ED with complaints of low back pain. This involves an extensive number of treatment options, and is a complaint that carries with it a moderate risk of complications and morbidity.   Additional history obtained:  Additional history obtained from: friend at bedside External records from outside source obtained and reviewed including: previous primary care visits  Imaging Studies ordered:  Imaging studies which included MRI.  I independently reviewed & interpreted imaging & am in agreement with radiology impression. Imaging shows: 1. Significant levocurvature in the lumbar spine centered at L2.  2. Right foraminal/extraforaminal protrusion at L3-L4 resulting in  moderate right neural foraminal stenosis with possible compression  of the exiting L3 nerve root.  3. Narrowing of the right subarticular zone at L2-L3 with possible  irritation of the traversing right L3 nerve root.  4. Mild narrowing of the right subarticular zone at L1-L2 without  evidence of nerve root impingement.  5. Mild multilevel facet arthropathy throughout the lumbar spine   Medications  I ordered medication including decadron, dilaudid for pain, inflammation Reevaluation of the patient after medication shows that patient improved  Consultations: I requested consultation with neurosurgery Dr.  Reinaldo Meeker,  and discussed lab and imaging findings as well as pertinent plan - they recommend: Medrol dose back and outpatient follow-up with neurosurgeon Dr. Annette Stable  ED Course: 61 year old female who presents to emergency department with low back pain. No cauda equina symptoms  No back pain red flags Very low suspicion of epidural abscess MRI imaging as described above  Given Decadron and Dilaudid here with moderate relief of symptoms.  Discussed with NS at recommendations as above  Able to ambulate without assistance here in ED. She already has baclofen and gabapentin ordered at home. Will prescribe Medrol dose pack per NS  recommendation, and I have discussed this with her.  Answered all questions at bedside. Discussed return precautions for cauda equina symptoms. She verbalizes understanding   After consideration of the diagnostic results and the patients response to treatment, I feel that the patent would benefit from discharge. The patient has been appropriately medically screened and/or stabilized in the ED. I have low suspicion for any other emergent medical condition which would require further screening, evaluation or treatment in the ED or require inpatient management.The patient is overall well appearing and non-toxic in appearance. They are hemodynamically stable at time of discharge.       Mickie Hillier, PA-C 05/04/21 1815    Malvin Johns, MD 05/05/21 [phone]

## 2021-05-04 NOTE — ED Triage Notes (Signed)
Patient c/o right lower back pain shooting down right leg. Orthopaedic doc gave Vicodin that is not helping. Has taken multiple Aleve without help

## 2021-05-09 DIAGNOSIS — M5416 Radiculopathy, lumbar region: Secondary | ICD-10-CM | POA: Diagnosis not present

## 2021-05-10 DIAGNOSIS — M5416 Radiculopathy, lumbar region: Secondary | ICD-10-CM | POA: Diagnosis not present

## 2021-05-10 DIAGNOSIS — G5602 Carpal tunnel syndrome, left upper limb: Secondary | ICD-10-CM | POA: Diagnosis not present

## 2021-05-21 ENCOUNTER — Telehealth: Payer: Self-pay

## 2021-05-21 DIAGNOSIS — G8929 Other chronic pain: Secondary | ICD-10-CM

## 2021-05-21 NOTE — Telephone Encounter (Signed)
Patient calls nurse line requesting prescription for lift chair and a cane. Patient states that she contacted her insurance and was told she would qualify due to her recent issues with her back. Patient states that she was told that PCP office should be the one to order and process these supplies. Routing to Dr. Andria Frames as he say patient last for concern.   Please advise if orders can be placed.   Talbot Grumbling, RN

## 2021-05-22 DIAGNOSIS — M5416 Radiculopathy, lumbar region: Secondary | ICD-10-CM | POA: Diagnosis not present

## 2021-05-22 NOTE — Telephone Encounter (Signed)
Yes, I put in an order for a DME cane.  If that order doesn't work, I am happy to provide a handwritten Rx.    The lift chair is another matter.  There are risks associated with lift chairs.  That deserves a discussion with the PCP.

## 2021-05-23 NOTE — Telephone Encounter (Signed)
Community message sent to [employer] for cane. Will await response.   Patient has scheduled a follow up visit tomorrow to further address back pain and DME items.   Talbot Grumbling, RN

## 2021-05-24 ENCOUNTER — Ambulatory Visit (INDEPENDENT_AMBULATORY_CARE_PROVIDER_SITE_OTHER): Payer: Medicare Other | Admitting: Family Medicine

## 2021-05-24 ENCOUNTER — Other Ambulatory Visit: Payer: Self-pay

## 2021-05-24 VITALS — BP 140/77 | HR 76 | Ht 68.0 in | Wt 188.2 lb

## 2021-05-24 DIAGNOSIS — M25551 Pain in right hip: Secondary | ICD-10-CM | POA: Diagnosis not present

## 2021-05-24 DIAGNOSIS — T148XXA Other injury of unspecified body region, initial encounter: Secondary | ICD-10-CM

## 2021-05-24 DIAGNOSIS — M4120 Other idiopathic scoliosis, site unspecified: Secondary | ICD-10-CM | POA: Diagnosis not present

## 2021-05-24 DIAGNOSIS — M549 Dorsalgia, unspecified: Secondary | ICD-10-CM | POA: Diagnosis not present

## 2021-05-24 DIAGNOSIS — G8929 Other chronic pain: Secondary | ICD-10-CM

## 2021-05-24 DIAGNOSIS — M25532 Pain in left wrist: Secondary | ICD-10-CM

## 2021-05-24 DIAGNOSIS — R2681 Unsteadiness on feet: Secondary | ICD-10-CM | POA: Diagnosis not present

## 2021-05-24 MED ORDER — NAPROXEN 500 MG PO TABS: 500.0000 mg | ORAL_TABLET | Freq: Two times a day (BID) | ORAL | 1 refills | Status: DC

## 2021-05-24 NOTE — Patient Instructions (Addendum)
It was great seeing you today!   For your back pain: Take 1000 mg Tylenol 2-3 times a day  with Naprosyn 500 mg 2 times (or 40 mg/2 tablets of ibuprofen) a day, take msucle relaxer Baclofen at night.  Continue taking Gabapentin 3 times a day.   For severe pain take Hydrocodone. Be sure to stop by the pharmacy/dollar store to pick up Lidocaine patches. Apply for 12 hours and then remove.     Watch for worsening symptoms such as an increasing weakness or loss of sensation legs, increasing pain and the loss of bladder or bowel function. Should any of these occur, go to the emergency department immediately.     Call April Green: [place], Care Management Phone: [phone] Email: [email or web address]  Take care,   [place]

## 2021-05-24 NOTE — Telephone Encounter (Signed)
Confirmation received from [employer] regarding cane.   Sent another community message to [employer] for DME lift chair.   Will await response.   Talbot Grumbling, RN

## 2021-05-24 NOTE — Progress Notes (Signed)
° °  SUBJECTIVE:   CHIEF COMPLAINT / HPI:    Lori Jones is a 61 y.o. female here for DME. Says she bent down to pick up a pain and had excruciating back pain therefore she went to the emergency department. Patient reports having almost a emergent neurosurgery after MRI was concerning for spinal nerve compression.  She recently underwent a epidural injection for her back pain.  She has known scoliosis but reports never wore a brace as they found it too late.  Here today as her neurosurgeon Dr. Annette Stable wanted her to get a cane and lift chair however her insurance required this to be ordered by her PCP.  She continues to have lower back pain but is wanting to decrease her opioid use.  Taking muscle relaxer and hydrocodone for pain.  PERTINENT  PMH / PSH: reviewed and updated as appropriate   OBJECTIVE:   BP 140/77    Pulse 76    Ht 5\' 8"  (1.727 m)    Wt 188 lb 3.2 oz (85.4 kg)    SpO2 100%    BMI 28.62 kg/m    GEN: pleasant female, in no acute distress  CV: regular rate  RESP: no increased work of breathing SKIN: warm, dry NEURO: alert, moves all extremities appropriately   ASSESSMENT/PLAN:   Chronic back pain Acute on chronic.  Lumbar MRI results and notes from Dr. Irven [place] office reviewed.  Status-post corticosteroid injection.  Follow-up with Dr. Annette Stable, neurosurgeon.  DME Cane and lift chair ordered.  Discussed chronic care management and patient agreeable.  Reviewed pain management strategies.  Continue gabapentin 100 mg 3 times daily as needed.  Tylenol with Naprosyn for moderate pain, baclofen at bedtime and throughout the day as needed, hydrocodone for severe pain.  Patient to follow-up with her neurosurgeon.     [name], DO PGY-3, [facility] 05/24/2021

## 2021-05-27 NOTE — Assessment & Plan Note (Addendum)
Acute on chronic.  Lumbar MRI results and notes from Dr. Irven Baltimore office reviewed.  Status-post corticosteroid injection.  Follow-up with Dr. Annette Stable, neurosurgeon.  DME Cane and lift chair ordered.  Discussed chronic care management and patient agreeable.  Reviewed pain management strategies.  Continue gabapentin 100 mg 3 times daily as needed.  Tylenol with Naprosyn for moderate pain, baclofen at bedtime and throughout the day as needed, hydrocodone for severe pain.  Patient to follow-up with her neurosurgeon.

## 2021-05-28 ENCOUNTER — Telehealth: Payer: Self-pay

## 2021-05-28 ENCOUNTER — Telehealth: Payer: Self-pay | Admitting: *Deleted

## 2021-05-28 NOTE — Telephone Encounter (Signed)
Please see response from [employer] regarding lift chair.   I'll have someone reach out to the patient! Thank you!   Talbot Grumbling, RN

## 2021-05-28 NOTE — Chronic Care Management (AMB) (Signed)
°  Care Management   Note  05/28/2021 Name: Lori Jones MRN: [id number] DOB: 12/07/1960  Lori Jones is a 61 y.o. year old female who is a primary care patient of [name], DO. I reached out to [name] by phone today in response to a referral sent by Lori Jones's primary care provider.   Lori Jones was given information about care management services today including:  Care management services include personalized support from designated clinical staff supervised by her physician, including individualized plan of care and coordination with other care providers 24/7 contact phone numbers for assistance for urgent and routine care needs. The patient may stop care management services at any time by phone call to the office staff.  Patient agreed to services and verbal consent obtained.   Follow up plan: Telephone appointment with care management team member scheduled for:06/04/21  [place] Management  Direct Dial: [phone]

## 2021-05-28 NOTE — Telephone Encounter (Signed)
° °  Telephone encounter was:  Successful.  05/28/2021 Name: [name] MRN: [id number] DOB: 10-31-1960  Lori Jones is a 61 y.o. year old female who is a primary care patient of [name], DO . The community resource team was consulted for assistance with  lift chair  Care guide performed the following interventions: Patient provided with information about care guide support team and interviewed to confirm resource needs.  Follow Up Plan:  Care guide will follow up with patient by phone over the next week    [place], Care Management  [phone] [address], [place], [place] [address] Phone: [phone] Email: [email or web address]

## 2021-05-30 ENCOUNTER — Other Ambulatory Visit: Payer: Self-pay | Admitting: Family Medicine

## 2021-05-30 DIAGNOSIS — G8929 Other chronic pain: Secondary | ICD-10-CM

## 2021-06-04 ENCOUNTER — Ambulatory Visit: Payer: Medicare Other

## 2021-06-04 NOTE — Patient Instructions (Signed)
?  [name]  it was nice speaking with you. Please call me directly [phone] if you have questions about the goals we discussed. ? ? ?Patient Care Plan: RN Case Manager  ?  ? ?Problem Identified: General Plan of Care in a patient with Chronic Back Pain   ?  ? ?Long-Range Goal: The patient will  learn and use medicinal and non pharmaceutcal ways to relieve pain.   ?Start Date: 06/04/2021  ?Expected End Date: 08/29/2021  ?Priority: High  ?Note:   ?Current Barriers:  ?Chronic Disease Management support and education needs related to Chronic Back Pain ? ?RNCM Clinical Goal(s):  ?Patient will demonstrate improved adherence to prescribed treatment plan for Chronic Back Pain as evidenced by 3/10 pain score  through collaboration with RN Care manager, provider, and care team.  ? ?Interventions: ?1:1 collaboration with primary care provider regarding development and update of comprehensive plan of care as evidenced by provider attestation and co-signature ?Inter-disciplinary care team collaboration (see longitudinal plan of care) ?Evaluation of current treatment plan related to  self management and patient's adherence to plan as established by provider ? ? ?Pain:  (Status: New goal.) Long Term Goal  ?Pain assessment performed- patient states that while she is sitting her pain is a 5/10, standing and walking it can vary between a 6-9/10. ?Medications reviewed ?Reviewed provider established plan for pain management; ?Discussed importance of adherence to all scheduled medical appointments; patient has an appointment with Dr Trenton Gammon Neurosurgeon on 06/07/21 ?Counseled on the importance of reporting any/all new or changed pain symptoms or management strategies to pain management provider; ?Discussed use of relaxation techniques and/or diversional activities to assist with pain reduction (distraction, imagery, relaxation, heat, and cold application; ?Reviewed with patient prescribed pharmacological and nonpharmacological pain relief  strategies; ?The patient said that when she is at her appointment with Dr Trenton Gammon that he will decide if she is able to perform Physical therapy.  She states that she does everything that she is asked to do help improve her pain. ? ?Patient Goals/Self Care Activities: ?-Patient/Caregiver will self-administer medications as prescribed as evidenced by self-report/primary caregiver report  ?-Patient/Caregiver will attend all scheduled provider appointments as evidenced by clinician review of documented attendance to scheduled appointments and patient/caregiver report ?-Patient/Caregiver will call pharmacy for medication refills as evidenced by patient report and review of pharmacy fill history as appropriate ?-Patient/Caregiver will call provider office for new concerns or questions as evidenced by review of documented incoming telephone call notes and patient report ?-Patient/Caregiver verbalizes understanding of plan ?-Patient/Caregiver will focus on medication adherence by taking medications as prescribed ?-Patient will continue to get good nights sleep and monitor her food intake for weight management ?-Patient will continue to use other modalities like her rubs and heat to relive her pain ?  ?  ? ?[name] received Care Management services today:  ?Care Management services include personalized support from designated clinical staff supervised by her physician, including individualized plan of care and coordination with other care providers ?24/7 contact [phone] for assistance for urgent and routine care needs. ?Care Management are voluntary services and be declined at any time by calling the office. ? ?The patient will view her AVS on My chart   ? ?Follow Up Plan: Patient would like continued follow-up.  CCM RNCM will outreach the patient within the next 2 weeks.  Patient will call office if needed prior to next encounter ? ? ?Lazaro Arms, RN  ? ?[phone]  ?

## 2021-06-04 NOTE — Chronic Care Management (AMB) (Signed)
? Care Management ?  ? RN Visit Note ? ?06/04/2021 ?Name: Lori Jones MRN: [id number] DOB: Jun 21, 1960 ? ?Subjective: ?Lori Jones is a 61 y.o. year old female who is a primary care patient of [name], DO. The care management team was consulted for assistance with disease management and care coordination needs.   ? ?Engaged with patient by telephone for initial visit in response to provider referral for case management and/or care coordination services.  ? ?Consent to Services:  ? [name] was given information about Care Management services today including:  ?Care Management services includes personalized support from designated clinical staff supervised by her physician, including individualized plan of care and coordination with other care providers ?24/7 contact phone numbers for assistance for urgent and routine care needs. ?The patient may stop case management services at any time by phone call to the office staff. ? ?Patient agreed to services and consent obtained.  ? ? ?Summary: The patient is making significant efforts toward positive changes in her chronic conditions.. See Care Plan below for interventions and patient self-care actives. ? ?Recommendation: The patient may benefit from continuing to take her medications as prescribed, use her heat and rubs to relieve pain, go to her appointment on 06/07/21 with Dr Trenton Gammon.  The patent agrees. ? ?Follow up Plan: Patient would like continued follow-up.  CCM RNCM will outreach the patient within the next 2 weeks.  Patient will call office if needed prior to next encounter ? ? ?Assessment: Review of patient past medical history, allergies, medications, health status, including review of consultants reports, laboratory and other test data, was performed as part of comprehensive evaluation and provision of chronic care management services.  ? ?SDOH (Social Determinants of Health) assessments and interventions performed:  No ? ?Care Plan ? ?Conditions to  be addressed/monitored:  Chronic Back Pain ? ?Care Plan : RN Case Manager  ?Updates made by Lazaro Arms, RN since 06/04/2021 12:00 AM  ?  ? ?Problem: General Plan of Care in a patient with Chronic Back Pain   ?  ? ?Long-Range Goal: The patient will  learn and use medicinal and non pharmaceutcal ways to relieve pain.   ?Start Date: 06/04/2021  ?Expected End Date: 08/29/2021  ?Priority: High  ?Note:   ?Current Barriers:  ?Chronic Disease Management support and education needs related to Chronic Back Pain ? ?RNCM Clinical Goal(s):  ?Patient will demonstrate improved adherence to prescribed treatment plan for Chronic Back Pain as evidenced by 3/10 pain score  through collaboration with RN Care manager, provider, and care team.  ? ?Interventions: ?1:1 collaboration with primary care provider regarding development and update of comprehensive plan of care as evidenced by provider attestation and co-signature ?Inter-disciplinary care team collaboration (see longitudinal plan of care) ?Evaluation of current treatment plan related to  self management and patient's adherence to plan as established by provider ? ? ?Pain:  (Status: New goal.) Long Term Goal  ?Pain assessment performed- patient states that while she is sitting her pain is a 5/10, standing and walking it can vary between a 6-9/10. ?Medications reviewed ?Reviewed provider established plan for pain management; ?Discussed importance of adherence to all scheduled medical appointments; patient has an appointment with Dr Trenton Gammon Neurosurgeon on 06/07/21 ?Counseled on the importance of reporting any/all new or changed pain symptoms or management strategies to pain management provider; ?Discussed use of relaxation techniques and/or diversional activities to assist with pain reduction (distraction, imagery, relaxation, heat, and cold application; ?Reviewed with patient prescribed pharmacological  and nonpharmacological pain relief strategies; ?The patient said that when she is at  her appointment with Dr Trenton Gammon that he will decide if she is able to perform Physical therapy.  She states that she does everything that she is asked to do help improve her pain. ? ?Patient Goals/Self Care Activities: ?-Patient/Caregiver will self-administer medications as prescribed as evidenced by self-report/primary caregiver report  ?-Patient/Caregiver will attend all scheduled provider appointments as evidenced by clinician review of documented attendance to scheduled appointments and patient/caregiver report ?-Patient/Caregiver will call pharmacy for medication refills as evidenced by patient report and review of pharmacy fill history as appropriate ?-Patient/Caregiver will call provider office for new concerns or questions as evidenced by review of documented incoming telephone call notes and patient report ?-Patient/Caregiver verbalizes understanding of plan ?-Patient/Caregiver will focus on medication adherence by taking medications as prescribed ?-Patient will continue to get good nights sleep and monitor her food intake for weight management ?-Patient will continue to use other modalities like her rubs and heat to relive her pain ?  ? ?Lazaro Arms RN, BSN, [place] ?Care Management Coordinator ?[place]  ?Phone: [phone]  ?  ? ? ? ? ? ? ? ? ? ?

## 2021-06-07 DIAGNOSIS — M5416 Radiculopathy, lumbar region: Secondary | ICD-10-CM | POA: Diagnosis not present

## 2021-06-11 NOTE — Telephone Encounter (Signed)
Patient calls nurse line regarding issues with not receiving lift chair.  ? ?Called [employer] to get update, spoke with [place]. Barnett Applebaum reports that they never received the order for the lift chair. Requested that we fax the order, demographics and OV note to [phone]. ? ?Documentation faxed to [employer]. Will await response.  ? ?Talbot Grumbling, RN ? ?

## 2021-06-12 DIAGNOSIS — M5416 Radiculopathy, lumbar region: Secondary | ICD-10-CM | POA: Diagnosis not present

## 2021-06-18 ENCOUNTER — Ambulatory Visit: Payer: Medicare Other

## 2021-06-19 NOTE — Patient Instructions (Signed)
Visit Information ? ?Ms. Renstrom  it was nice speaking with you. Please call me directly [phone] if you have questions about the goals we discussed. ? ?  ?Patient Goals/Self Care Activities: ?-Patient/Caregiver will self-administer medications as prescribed as evidenced by self-report/primary caregiver report  ?-Patient/Caregiver will attend all scheduled provider appointments as evidenced by clinician review of documented attendance to scheduled appointments and patient/caregiver report ?-Patient/Caregiver will call pharmacy for medication refills as evidenced by patient report and review of pharmacy fill history as appropriate ?-Patient/Caregiver will call provider office for new concerns or questions as evidenced by review of documented incoming telephone call notes and patient report ?-Patient/Caregiver verbalizes understanding of plan ?-Patient/Caregiver will focus on medication adherence by taking medications as prescribed ?-Patient will continue to get good nights sleep and monitor her food intake for weight management ?-Patient will continue to use other modalities like her rubs and heat to relive her pain ?   ?  ? ? ? ?Patient verbalizes understanding of instructions and care plan provided today and agrees to view in [place]. Active MyChart status confirmed with patient.   ? ?Follow up Plan: Patient would like continued follow-up.  CCM RNCM will outreach the patient within the next 5 weeks.  Patient will call office if needed prior to next encounter ? ?Lazaro Arms, RN ? ?[phone]  ?

## 2021-06-19 NOTE — Chronic Care Management (AMB) (Signed)
?Chronic Care Management  ? ?CCM RN Visit Note ? ?06/19/2021 ?Name: Lori Jones MRN: [id number] DOB: 08-May-1960 ? ?Subjective: ?Lori Jones is a 61 y.o. year old female who is a primary care patient of [name], DO. The care management team was consulted for assistance with disease management and care coordination needs.   ? ?Engaged with patient by telephone for follow up visit in response to provider referral for case management and/or care coordination services.  ? ?Consent to Services:  ?The patient was given information about Chronic Care Management services, agreed to services, and gave verbal consent prior to initiation of services.  Please see initial visit note for detailed documentation.  ? ?Patient agreed to services and verbal consent obtained.  ? ? ?Summary:  The patient continues to maintain positive progress with care plan goals. See Care Plan below for interventions and patient self-care actives. ? ?Recommendation: The patient may benefit from taking medications as prescribed, exercising as tolerated, calling your physician if numbers are abnormal, and using heat and ointments . ? ?Follow up Plan: Patient would like continued follow-up.  CCM RNCM will outreach the patient within the next 5 weeks.  Patient will call office if needed prior to next encounter ? ? ?Assessment: Review of patient past medical history, allergies, medications, health status, including review of consultants reports, laboratory and other test data, was performed as part of comprehensive evaluation and provision of chronic care management services.  ? ?SDOH (Social Determinants of Health) assessments and interventions performed:  No ? ?CCM Care Plan ? ?Conditions to be addressed/monitored: Chronic Back Pain ? ?Care Plan : RN Case Manager  ?Updates made by Lazaro Arms, RN since 06/19/2021 12:00 AM  ?  ? ?Problem: General Plan of Care in a patient with Chronic Back Pain   ?  ? ?Long-Range Goal: The patient will  learn  and use medicinal and non pharmaceutcal ways to relieve pain.   ?Start Date: 06/04/2021  ?Expected End Date: 08/29/2021  ?Priority: High  ?Note:   ?Current Barriers:  ?Chronic Disease Management support and education needs related to Chronic Back Pain ? ?RNCM Clinical Goal(s):  ?Patient will demonstrate improved adherence to prescribed treatment plan for Chronic Back Pain as evidenced by 3/10 pain score  through collaboration with RN Care manager, provider, and care team.  ? ?Interventions: ?1:1 collaboration with primary care provider regarding development and update of comprehensive plan of care as evidenced by provider attestation and co-signature ?Inter-disciplinary care team collaboration (see longitudinal plan of care) ?Evaluation of current treatment plan related to  self management and patient's adherence to plan as established by provider ? ? ?Pain:  (Status: New goal.) Long Term Goal  ?Pain assessment performed- 0/10. ?Reviewed provider established plan for pain management; ?Counseled on the importance of reporting any/all new or changed pain symptoms or management strategies to pain management provider; ?Discussed use of relaxation techniques and/or diversional activities to assist with pain reduction (distraction, imagery, relaxation, heat, and cold application; ?Reviewed with patient prescribed pharmacological and nonpharmacological pain relief strategies; ?06/18/21:  Lori Jones stated that she was doing well this morning; she was still lying in bed with a pillow behind her back. She had no pain then. She will be starting physical therapy soon and is ready; she feels it will help her back. She had a follow-up visit with Dr. Annette Stable on 06/07/21 and thought the epidural injection helped with the pain in her back. We discussed the lift chair that she is looking to  receive from [place]. She has not heard anything from them. I have sent a message to [employer] for the patient and given her a number for the [facility]  office. ? ?Patient Goals/Self Care Activities: ?-Patient/Caregiver will self-administer medications as prescribed as evidenced by self-report/primary caregiver report  ?-Patient/Caregiver will attend all scheduled provider appointments as evidenced by clinician review of documented attendance to scheduled appointments and patient/caregiver report ?-Patient/Caregiver will call pharmacy for medication refills as evidenced by patient report and review of pharmacy fill history as appropriate ?-Patient/Caregiver will call provider office for new concerns or questions as evidenced by review of documented incoming telephone call notes and patient report ?-Patient/Caregiver verbalizes understanding of plan ?-Patient/Caregiver will focus on medication adherence by taking medications as prescribed ?-Patient will continue to get good nights sleep and monitor her food intake for weight management ?-Patient will continue to use other modalities like her rubs and heat to relive her pain ?  ? ?Lazaro Arms RN, BSN, [place] ?Care Management Coordinator ?[place]  ?Phone: [phone]  ?  ? ? ? ? ? ? ? ?

## 2021-06-20 ENCOUNTER — Ambulatory Visit: Payer: Self-pay

## 2021-06-20 NOTE — Chronic Care Management (AMB) (Signed)
? ?  RN Case Manager ?Care Management  ? Phone Outreach  ? ? ?06/20/2021 ?Name: Lori Jones MRN: [id number] DOB: 04-27-1960 ? ?Lori Jones is a 61 y.o. year old female who is a primary care patient of [name], DO .  ? ?CM RN received a call from Lori Jones about the lift chair, and she called the number in the [place] office and has gotten several different answers from different people. I advised her that I would send a message to [employer] to ask if they would have someone to call her to give more clarity to the situation. ? ?Follow Up Plan: CM RN will follow up with the patient at he next scheduled [place].  ? ?Review of patient status, including review of consultants reports, relevant laboratory and other test results, and collaboration with appropriate care team members and the patient's provider was performed as part of comprehensive patient evaluation and provision of care management services.   ? ?Lazaro Arms RN, BSN, [facility] ?Care Management Coordinator ?[place] ?Phone: [phone] Fax: [phone] ?  ? ? ? ? ? ? ? ? ? ? ? ?

## 2021-06-25 ENCOUNTER — Ambulatory Visit: Payer: Medicare Other | Admitting: Physical Therapy

## 2021-06-27 NOTE — Therapy (Incomplete)
?OUTPATIENT PHYSICAL THERAPY THORACOLUMBAR EVALUATION ? ? ?Patient Name: Lori Jones ?MRN: [id number] ?DOB:[birth year], 61 y.o., female ?Today's Date: 06/27/2021 ? ? ? ?Past Medical History:  ?Diagnosis Date  ? Allergy   ? Anemia   ? Anginal pain ([place])   ? hx of CPs  ? Anxiety   ? Arthritis   ? Ascending aorta dilatation (HCC)   ? 84m by echo 08/2020  ? Asthma   ? hx of in childhood   ? Back pain, chronic   ? Bursitis   ? Depression   ? Diabetes mellitus   ? Foot fracture, right   ? Heart murmur   ? Herniated disc   ? Hypercholesteremia   ? Hypertension   ? no meds  ? Hypothyroid   ? Kidney disease   ? "pelvic kidney" bilat   ? Scoliosis   ? Seizures ([place]   ? hx of in childhood and again in college   ? Stroke ([facility]   ? hx of sun stroke while in college   ? ?Past Surgical History:  ?Procedure Laterality Date  ? ABDOMINAL HYSTERECTOMY    ? CESAREAN SECTION    ? CHOLECYSTECTOMY N/A 09/08/2014  ? Procedure: LAPAROSCOPIC CHOLECYSTECTOMY;  Surgeon: DCoralie Keens MD;  [facility];  Service: General;  Laterality: N/A;  ? HAND SURGERY    ? dog bite  ? INCISIONAL HERNIA REPAIR N/A 04/24/2016  ? Procedure: HERNIA REPAIR INCISIONAL;  Surgeon: DCoralie Keens MD;  [place]: [facility]  Service: General;  Laterality: N/A;  ? INSERTION OF MESH N/A 04/24/2016  ? Procedure: INSERTION OF MESH;  Surgeon: DCoralie Keens MD;  Location: [facility]  Service: General;  Laterality: N/A;  ? Thumb surgery    ? VENTRAL HERNIA REPAIR N/A 09/08/2014  ? Procedure: VENTRAL HERNIA REPAIR;  Surgeon: DCoralie Keens MD;  [facility];  Service: General;  Laterality: N/A;  ? ?Patient Active Problem List  ? Diagnosis Date Noted  ? Pelvic kidney 11/29/2020  ? Right hip pain 11/29/2020  ? Unsteadiness 09/27/2020  ? Ascending aorta dilatation ([facility] 09/22/2020  ? Idiopathic scoliosis 04/05/2020  ? Costochondritis 09/14/2019  ? Anxiety and depression 03/20/2018  ? Gastroesophageal reflux disease 03/20/2018  ? Atypical chest pain 10/04/2016  ? Hair  loss 03/06/2016  ? Chronic back pain 02/08/2015  ? Left wrist pain 12/17/2013  ? HLD (hyperlipidemia) 10/06/2013  ? Hypothyroidism 08/06/2013  ? Schizoaffective disorder, unspecified type ([facility] 07/10/2013  ? Diabetes mellitus without complication ([place] [id number] ? Heart murmur 07/10/2013  ? ? ?PCP: BLyndee Hensen DO ? ?REFERRING PROVIDER: PEarnie Larsson MD ? ?REFERRING DIAG: M54.16 (ICD-10-CM) - Radiculopathy, lumbar region ? ?THERAPY DIAG:  ?No diagnosis found. ? ?ONSET DATE: *** ? ?SUBJECTIVE:                                                                                                                                                                                          ? ?  SUBJECTIVE STATEMENT: ?*** ?PERTINENT HISTORY:  ?*** ? ?PAIN:  ?Are you having pain? Yes: {yespain:27235::"NPRS scale: ***/10","Pain location: ***","Pain description: ***","Aggravating factors: ***","Relieving factors: ***"} ? ? ?PRECAUTIONS: {Therapy precautions:24002} ? ?WEIGHT BEARING RESTRICTIONS {Yes ***/No:24003} ? ?FALLS:  ?Has patient fallen in last 6 months? {fallsyesno:27318} ? ?LIVING ENVIRONMENT: ?Lives with: {[facility] lives with:25569::"lives with their family"} ?Lives in: {Lives in:25570} ?Stairs: {opstairs:27293} ?Has following equipment at home: {Assistive devices:23999} ? ?OCCUPATION: *** ? ?PLOF: {PLOF:24004} ? ?PATIENT GOALS *** ? ? ?OBJECTIVE:  ? ?DIAGNOSTIC FINDINGS:  ?IMPRESSION:05-04-21 ?1. Significant levocurvature in the lumbar spine centered at L2. ?2. Right foraminal/extraforaminal protrusion at L3-L4 resulting in ?moderate right neural foraminal stenosis with possible compression ?of the exiting L3 nerve root. ?3. Narrowing of the right subarticular zone at L2-L3 with possible ?irritation of the traversing right L3 nerve root. ?4. Mild narrowing of the right subarticular zone at L1-L2 without ?evidence of nerve root impingement. ?5. Mild multilevel facet arthropathy throughout the lumbar spine. ? ?PATIENT SURVEYS:   ?{rehab surveys:24030} ? ?SCREENING FOR RED FLAGS: ?Bowel or bladder incontinence: {Yes/No:304960894} ?Spinal tumors: {Yes/No:304960894} ?Cauda equina syndrome: {Yes/No:304960894} ?Compression fracture: {Yes/No:304960894} ?Abdominal aneurysm: {Yes/No:304960894} ? ?COGNITION: ? Overall cognitive status: {cognition:24006}   ?  ?SENSATION: ?{sensation:27233} ? ?MUSCLE LENGTH: ?Hamstrings: Right *** deg; Left *** deg ?Thomas test: Right *** deg; Left *** deg ? ?POSTURE:  ?*** ? ?PALPATION: ?*** ? ?LUMBAR ROM:  ? ?{AROM/PROM:27142}  A/PROM  ?06/27/2021  ?Flexion   ?Extension   ?Right lateral flexion   ?Left lateral flexion   ?Right rotation   ?Left rotation   ? (Blank rows = not tested) ? ?LE ROM: ? ?{AROM/PROM:27142}  Right ?06/27/2021 Left ?06/27/2021  ?Hip flexion    ?Hip extension    ?Hip abduction    ?Hip adduction    ?Hip internal rotation    ?Hip external rotation    ?Knee flexion    ?Knee extension    ?Ankle dorsiflexion    ?Ankle plantarflexion    ?Ankle inversion    ?Ankle eversion    ? (Blank rows = not tested) ? ?LE MMT: ? ?MMT Right ?06/27/2021 Left ?06/27/2021  ?Hip flexion    ?Hip extension    ?Hip abduction    ?Hip adduction    ?Hip internal rotation    ?Hip external rotation    ?Knee flexion    ?Knee extension    ?Ankle dorsiflexion    ?Ankle plantarflexion    ?Ankle inversion    ?Ankle eversion    ? (Blank rows = not tested) ? ?LUMBAR SPECIAL TESTS:  ?{lumbar special test:25242} ? ?FUNCTIONAL TESTS:  ?{Functional tests:24029} ? ?GAIT: ?Distance walked: *** ?Assistive device utilized: {Assistive devices:23999} ?Level of assistance: {Levels of assistance:24026} ?Comments: *** ? ? ? ?TODAY'S TREATMENT  ?*** ? ? ?PATIENT EDUCATION:  ?Education details: *** ?Person educated: {Person educated:25204} ?Education method: {Education Method:25205} ?Education comprehension: {Education Comprehension:25206} ? ? ?HOME EXERCISE PROGRAM: ?*** ? ?ASSESSMENT: ? ?CLINICAL IMPRESSION: ?Patient is a *** y.o. *** who was seen  today for physical therapy evaluation and treatment for ***.  ? ? ?OBJECTIVE IMPAIRMENTS {opptimpairments:25111}.  ? ?ACTIVITY LIMITATIONS {activity limitations:25113}.  ? ?PERSONAL FACTORS {Personal factors:25162} are also affecting patient's functional outcome.  ? ? ?REHAB POTENTIAL: {rehabpotential:25112} ? ?CLINICAL DECISION MAKING: {clinical decision making:25114} ? ?EVALUATION COMPLEXITY: {Evaluation complexity:25115} ? ? ?GOALS: ?Goals reviewed with patient? {yes/no:20286} ? ?SHORT TERM GOALS: Target date: {follow up:25551} ? ?*** ?Baseline: ?Goal status: {GOALSTATUS:25110} ? ?2.  *** ?Baseline:  ?Goal status: {GOALSTATUS:25110} ? ?3.  *** ?Baseline:  ?  Goal status: {GOALSTATUS:25110} ? ?4.  *** ?Baseline:  ?Goal status: {GOALSTATUS:25110} ? ?5.  *** ?Baseline:  ?Goal status: {GOALSTATUS:25110} ? ?6.  *** ?Baseline:  ?Goal status: {GOALSTATUS:25110} ? ?LONG TERM GOALS: Target date: {follow up:25551} ? ?*** ?Baseline:  ?Goal status: {GOALSTATUS:25110} ? ?2.  *** ?Baseline:  ?Goal status: {GOALSTATUS:25110} ? ?3.  *** ?Baseline:  ?Goal status: {GOALSTATUS:25110} ? ?4.  *** ?Baseline:  ?Goal status: {GOALSTATUS:25110} ? ?5.  *** ?Baseline:  ?Goal status: {GOALSTATUS:25110} ? ?6.  *** ?Baseline:  ?Goal status: {GOALSTATUS:25110} ? ? ?PLAN: ?PT FREQUENCY: {rehab frequency:25116} ? ?PT DURATION: {rehab duration:25117} ? ?PLANNED INTERVENTIONS: {rehab planned interventions:25118::"Therapeutic exercises","Therapeutic activity","Neuromuscular re-education","Balance training","Gait training","Patient/Family education","Joint mobilization"}. ? ?PLAN FOR NEXT SESSION: *** ? ?*** ?

## 2021-06-28 ENCOUNTER — Ambulatory Visit: Payer: Medicare Other | Attending: Physical Therapy | Admitting: Physical Therapy

## 2021-07-02 ENCOUNTER — Encounter: Payer: Self-pay | Admitting: Physical Therapy

## 2021-07-02 ENCOUNTER — Ambulatory Visit: Payer: Medicare Other | Attending: Neurosurgery | Admitting: Physical Therapy

## 2021-07-02 DIAGNOSIS — M62830 Muscle spasm of back: Secondary | ICD-10-CM | POA: Insufficient documentation

## 2021-07-02 DIAGNOSIS — M545 Low back pain, unspecified: Secondary | ICD-10-CM | POA: Diagnosis not present

## 2021-07-02 DIAGNOSIS — G8929 Other chronic pain: Secondary | ICD-10-CM | POA: Insufficient documentation

## 2021-07-02 DIAGNOSIS — M6281 Muscle weakness (generalized): Secondary | ICD-10-CM | POA: Insufficient documentation

## 2021-07-02 DIAGNOSIS — R262 Difficulty in walking, not elsewhere classified: Secondary | ICD-10-CM | POA: Diagnosis not present

## 2021-07-02 DIAGNOSIS — M79642 Pain in left hand: Secondary | ICD-10-CM | POA: Insufficient documentation

## 2021-07-02 DIAGNOSIS — M546 Pain in thoracic spine: Secondary | ICD-10-CM | POA: Insufficient documentation
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
## 2021-07-02 NOTE — Patient Instructions (Signed)
Access Code: [email or web address] ?URL: [email or web address] ?Date: 07/02/2021 ?Prepared by: Voncille Lo ? ?Exercises ?- Supine Pelvic Tilt  - 1 x daily - 7 x weekly - 3 sets - 10 reps ?- Supine Single Knee to Chest Stretch  - 1-2 x daily - 7 x weekly - 1 sets - 5-10 reps - 10 hold ?- Supine Lower Trunk Rotation  - 1-2 x daily - 7 x weekly - 1 sets - 5 reps - 20 hold ?- Supine Bridge  - 1-2 x daily - 7 x weekly - 3 sets - 10 reps ?- Sit to stand with sink support Movement snack  - 1 x daily - 7 x weekly - 3 sets - 10 reps ? ?Voncille Lo, PT, ATRIC ?Certified Exercise Expert for the Aging Adult  ?07/02/21 10:59 AM ?Phone: [phone] ?Fax: [phone]  ?

## 2021-07-04 ENCOUNTER — Other Ambulatory Visit: Payer: Self-pay | Admitting: Family Medicine

## 2021-07-04 DIAGNOSIS — M25532 Pain in left wrist: Secondary | ICD-10-CM

## 2021-07-05 DIAGNOSIS — G5602 Carpal tunnel syndrome, left upper limb: Secondary | ICD-10-CM | POA: Diagnosis not present

## 2021-07-09 NOTE — Therapy (Signed)
?OUTPATIENT PHYSICAL THERAPY TREATMENT NOTE ? ? ?Patient Name: Lori Jones ?MRN: [id number] ?DOB:Nov 24, 1960, 61 y.o., female ?Today's Date: 07/10/2021 ? ?PCP: [name], DO ?REFERRING PROVIDER: [name], DO ? ?END OF SESSION:  ? PT End of Session - 07/10/21 1329   ? ? Visit Number 2   ? Number of Visits 9   ? Date for PT Re-Evaluation 08/27/21   ? Authorization Type UHC/MCR/MCD  6th visit and 10th FOTO  10th visit progress note   ? Progress Note Due on Visit 10   ? PT Start Time 1105   ? PT Stop Time 1200   ? PT Time Calculation (min) 55 min   ? Activity Tolerance Patient limited by pain   ? Behavior During Therapy [facility] for tasks assessed/performed   ? ?  ?  ? ?  ? ? ?Past Medical History:  ?Diagnosis Date  ? Allergy   ? Anemia   ? Anginal pain ([place])   ? hx of CPs  ? Anxiety   ? Arthritis   ? Ascending aorta dilatation (HCC)   ? 31m by echo 08/2020  ? Asthma   ? hx of in childhood   ? Back pain, chronic   ? Bursitis   ? Depression   ? Diabetes mellitus   ? Foot fracture, right   ? Heart murmur   ? Herniated disc   ? Hypercholesteremia   ? Hypertension   ? no meds  ? Hypothyroid   ? Kidney disease   ? "pelvic kidney" bilat   ? Scoliosis   ? Seizures ([place]   ? hx of in childhood and again in college   ? Stroke ([facility]   ? hx of sun stroke while in college   ? ?Past Surgical History:  ?Procedure Laterality Date  ? ABDOMINAL HYSTERECTOMY    ? CESAREAN SECTION    ? CHOLECYSTECTOMY N/A 09/08/2014  ? Procedure: LAPAROSCOPIC CHOLECYSTECTOMY;  Surgeon: DCoralie Keens MD;  Location: [facility];  Service: General;  Laterality: N/A;  ? HAND SURGERY    ? dog bite  ? INCISIONAL HERNIA REPAIR N/A 04/24/2016  ? Procedure: HERNIA REPAIR INCISIONAL;  Surgeon: DCoralie Keens MD;  Location: [facility]  Service: General;  Laterality: N/A;  ? INSERTION OF MESH N/A 04/24/2016  ? Procedure: INSERTION OF MESH;  Surgeon: DCoralie Keens MD;  Location: [facility]  Service: General;  Laterality: N/A;  ? Thumb surgery    ? VENTRAL HERNIA  REPAIR N/A 09/08/2014  ? Procedure: VENTRAL HERNIA REPAIR;  Surgeon: DCoralie Keens MD;  Location: [facility];  Service: General;  Laterality: N/A;  ? ?Patient Active Problem List  ? Diagnosis Date Noted  ? Pelvic kidney 11/29/2020  ? Right hip pain 11/29/2020  ? Unsteadiness 09/27/2020  ? Ascending aorta dilatation ([place] 09/22/2020  ? Idiopathic scoliosis 04/05/2020  ? Costochondritis 09/14/2019  ? Anxiety and depression 03/20/2018  ? Gastroesophageal reflux disease 03/20/2018  ? Atypical chest pain 10/04/2016  ? Hair loss 03/06/2016  ? Chronic back pain 02/08/2015  ? Left wrist pain 12/17/2013  ? HLD (hyperlipidemia) 10/06/2013  ? Hypothyroidism 08/06/2013  ? Schizoaffective disorder, unspecified type ([place] 07/10/2013  ? Diabetes mellitus without complication ([place] [id number]/[id number] ? Heart murmur 07/10/2013  ? ? ?REFERRING DIAG: M54.16 (ICD-10-CM) - Radiculopathy, lumbar region ? ?THERAPY DIAG:  ?Chronic low back pain without sciatica, unspecified back pain laterality ? ?Difficulty in walking, not elsewhere classified ? ?Muscle weakness (generalized) ? ?Muscle spasm of back ? ?Pain in thoracic spine ? ?  Pain in left hand ? ?PERTINENT HISTORY: Allergy, anemia, Hx of Chest pain, anxiety, OA, Asthma, Chronic back pain, depression , anginal pain, DM, heart murmur, foot (5th metatarsal fx, herniated disc, HTN, hypothryroiid, Scoliosis, seizures, Stroke,schizoaffective disorder ? ?PRECAUTIONS: none ? ?SUBJECTIVE: I have been getting my steps in.  Brought in my data from my fit bit.  Average steps daily 13, 144 steps.  41,068 stesp better than last week.  My pain level is 4/10 in low back and leg and both legs are numb. Had cortisone shot on April 6th in left wrist and doing better.  ?Back is 6/10 pain in Left side and 4/10 on R side.  I didn't have pain going down Left side  ? ?PAIN:  ?Are you having pain? Yes: NPRS scale: 4/10 ?Pain location: low back and numbness in bil inner thights ?Pain description: tingling numb in bil  inner thighs and back and  ?Aggravating factors: standing and walking too long. When I dont have back support.  Cant drive past 1 hour, washing dishes , household chores and making beds one sheet at a time,   ?Relieving factors: gabapentin ? ? ? ? ? ? ? ?  ?OBJECTIVE:  ?  ?DIAGNOSTIC FINDINGS:  ?  ?IMPRESSION: 05-04-21 ?1. Significant levocurvature in the lumbar spine centered at L2. ?2. Right foraminal/extraforaminal protrusion at L3-L4 resulting in ?moderate right neural foraminal stenosis with possible compression ?of the exiting L3 nerve root. ?3. Narrowing of the right subarticular zone at L2-L3 with possible ?irritation of the traversing right L3 nerve root. ?4. Mild narrowing of the right subarticular zone at L1-L2 without ?evidence of nerve root impingement. ?5. Mild multilevel facet arthropathy throughout the lumbar spine. ?  ?PATIENT SURVEYS:  ?FOTO 37%  predicted 48% ?  ?SCREENING FOR RED FLAGS: ?Bowel or bladder incontinence: No ?Spinal tumors: No ?Cauda equina syndrome: No ?Compression fracture: No ?Abdominal aneurysm: No ?  ?COGNITION: ?          Overall cognitive status: Within functional limits for tasks assessed               ?           ?SENSATION: ?WFL for low back  pt has decreased sensation for L>R hands  carpal tunnel ?  ?MUSCLE LENGTH: ?Hamstrings: EFL ?Thomas test: Right 40 deg; Left 43 deg ?  ?POSTURE:  ?Pt leans to left in gait and standing.  Pelvic level elevated on R side. Levoscoliosis,  ?  ?PALPATION: ?TTP over R >L QL and Lumbar paraspinals R >L pain ?  ?LUMBAR ROM:  ?  ?Active  A/PROM  ?07/02/2021  ?Flexion Fingertips to knees bil  ?Extension 50%  ?Right lateral flexion Knee jt pain  ?Left lateral flexion Knee jt no pain  ?Right rotation 60%  ?Left rotation 60%  ? (Blank rows = not tested) ?  ?LE ROM:  Pt with tightness and pulling with flexion and R side lateral  and extension ?  ?Active  Right ?07/02/2021 Left ?07/02/2021  ?Hip flexion 100 100  ?Hip extension      ?Hip abduction      ?Hip  adduction      ?Hip internal rotation 23 25  ?Hip external rotation 25 26  ?Knee flexion      ?Knee extension      ?Ankle dorsiflexion      ?Ankle plantarflexion      ?Ankle inversion      ?Ankle eversion      ? (Blank rows =  not tested) ?  ?LE MMT:  grossly 4/5 in bil LE    5 X STS 19.55 sec ?  ?MMT Right ?07/02/2021 Left ?07/02/2021  ?Hip flexion 4 4  ?Hip extension 4- 4-  ?Hip abduction 3- 4-  ?Hip adduction      ?Hip internal rotation      ?Hip external rotation      ?Knee flexion      ?Knee extension      ?Ankle dorsiflexion      ?Ankle plantarflexion      ?Ankle inversion      ?Ankle eversion      ? (Blank rows = not tested) ?  ?LUMBAR SPECIAL TESTS:  ?Straight leg raise test: Negative, Slump test: Negative, SI Compression/distraction test: Negative, and Thomas test: Positive ?  ?FUNCTIONAL TESTS:  ?5 times sit to stand: 19.55 ?6 minute walk test: TBA ?  ?Pt bears wt to left with sit to stand ?Unable to squat more than 25 % range and bear wt  to Left ?GAIT: ?Distance walked: 150 ?Assistive device utilized:  used no AD but did bring SPC ?Level of assistance: Modified independence ?Comments:  2.97 ft/sec gait velocity.  Pt with left trunk lean with antalgic gait ?  ?  ?  ?TODAY'S TREATMENT  ? ?[facility] Adult PT Treatment:                                                DATE: 07-10-21 ?6MWT 1444 ft ( normal for age 29) ?Therapeutic Exercise: ? ?Supine Pelvic Tilt  - 3 x 10  moderate VC necessary ?Supine Single Knee to Chest Stretch  2 x 30 sec hold each R and Left ?Supine Lower Trunk Rotation  - 1-2 x daily - 7 x weekly - 1 sets - 5 reps - 20 hold ?Supine Bridge   1 x 5  reps 3 sec hold pt with difficulty and needing much encouragement ?Bent knee fall out 2 x 10 R and Left ?Supine marching 2 x 10 R and L ?Sit to stand with sink support Movement snack  - 1 x daily - 7 x weekly - 3 sets - 10 reps ?Star pattern sub scapular stabilizers with rest between LE exercises for abdominal engagement ? ?Manual-  LAD for bil LE with  decreased pain.  Sitting QL STW at end of RX to relieve spasms ? ?Moist hot pack at end for lumbar  in supine for 15 min ? ? EVAL  Gave intial HEP  ?  ?  ?PATIENT EDUCATION:  ?Education details: POC Explanati

## 2021-07-10 ENCOUNTER — Ambulatory Visit: Payer: Medicare Other | Admitting: Physical Therapy

## 2021-07-10 ENCOUNTER — Encounter: Payer: Self-pay | Admitting: Physical Therapy

## 2021-07-10 DIAGNOSIS — M545 Low back pain, unspecified: Secondary | ICD-10-CM | POA: Diagnosis not present

## 2021-07-10 DIAGNOSIS — G8929 Other chronic pain: Secondary | ICD-10-CM | POA: Diagnosis not present

## 2021-07-10 DIAGNOSIS — M6281 Muscle weakness (generalized): Secondary | ICD-10-CM | POA: Diagnosis not present

## 2021-07-10 DIAGNOSIS — M546 Pain in thoracic spine: Secondary | ICD-10-CM

## 2021-07-10 DIAGNOSIS — R262 Difficulty in walking, not elsewhere classified: Secondary | ICD-10-CM | POA: Diagnosis not present

## 2021-07-10 DIAGNOSIS — M62830 Muscle spasm of back: Secondary | ICD-10-CM

## 2021-07-10 DIAGNOSIS — M79642 Pain in left hand: Secondary | ICD-10-CM | POA: Diagnosis not present

## 2021-07-12 ENCOUNTER — Encounter: Payer: Self-pay | Admitting: Family Medicine

## 2021-07-12 ENCOUNTER — Ambulatory Visit (INDEPENDENT_AMBULATORY_CARE_PROVIDER_SITE_OTHER): Payer: Medicare Other | Admitting: Family Medicine

## 2021-07-12 ENCOUNTER — Encounter: Payer: Medicare Other | Admitting: Physical Therapy

## 2021-07-12 VITALS — BP 135/78 | HR 82 | Ht 68.0 in | Wt 190.0 lb

## 2021-07-12 DIAGNOSIS — E039 Hypothyroidism, unspecified: Secondary | ICD-10-CM

## 2021-07-12 DIAGNOSIS — M549 Dorsalgia, unspecified: Secondary | ICD-10-CM | POA: Diagnosis not present

## 2021-07-12 DIAGNOSIS — G8929 Other chronic pain: Secondary | ICD-10-CM

## 2021-07-12 DIAGNOSIS — E119 Type 2 diabetes mellitus without complications: Secondary | ICD-10-CM

## 2021-07-12 DIAGNOSIS — E7800 Pure hypercholesterolemia, unspecified: Secondary | ICD-10-CM

## 2021-07-12 LAB — POCT GLYCOSYLATED HEMOGLOBIN (HGB A1C): HbA1c, POC (controlled diabetic range): 9.2 % — AB (ref 0.0–7.0)

## 2021-07-12 MED ORDER — GABAPENTIN 100 MG PO CAPS: ORAL_CAPSULE | ORAL | 1 refills | Status: DC

## 2021-07-12 MED ORDER — BLOOD GLUCOSE MONITOR KIT: PACK | 2 refills | Status: AC

## 2021-07-12 NOTE — Progress Notes (Signed)
6 ? ?SUBJECTIVE:  ? ?CHIEF COMPLAINT / HPI:  ? ? ?Lori Jones is a 61 y.o. female here for:  ? ?Diabetes Mellitus  ?Takes metformin. Denies missing any doses of DM medications. Denies increased thirst, hunger, or frequent urination.  Reports recently (06/28/21) one of her home RN resulted an a1c of 6.8 a1c.  She was surprised by the slight elevation from her baseline.  She notes that she has been eating a lot of worse since she has not been able to be as physically active due to her waist and back pain. ? ?Hypothyroidism ?Takes Synthroid 25 mcg daily. ? ?HLD ?Patient taking Lipitor. Denies missing any doses. Reports no medication side effects. ? ? ?PERTINENT  PMH / PSH: reviewed and updated as appropriate  ? ?OBJECTIVE:  ? ?BP 135/78   Pulse 82   Ht '5\' 8"'$  (1.727 m)   Wt 190 lb (86.2 kg)   SpO2 99%   BMI 28.89 kg/m?   ? ?GEN: pleasant female in no acute distress  ?CVS: well perfused  ?RESP: speaking in full sentences without pause, no respiratory distress  ?SKIN: well perfused  ? ?ASSESSMENT/PLAN:  ? ?Diabetes mellitus without complication ([place]) ?Uncontrolled. Continue current regimen: Metformin 1000 mg BID. POC A1c today 9.2 and previously 7.0 in [place].  Given the home A1c was reported to be 6.8, will have confirmatory a1c sent-out.  She does not wish to make any medication changes at this time.  She was frankly in shock due to her A1c results.  Encouraged continued diet rich in vegetables and complex carbs.  Heart healthy carb modified diet. Counseled on need to continue exercising as tolerated ? ?Statin therapy: Lipitor 40 mg ?ACEi/ARB: Currently none, consider at follow-up for renal protection ?Urine microalbumine: due in June 2023 ?Eye exam: advised to get yearly eye exam  ? ?Obtain send out A1c, lipid panel, CMP ? ?Hypothyroidism ?Obtain TSH today.  Adjust levothyroxine as needed ? ?Chronic back pain ?Follows with neurosurgery.  Refill gabapentin 100 mg 3 times daily as needed ?  ? ? ? ? ?[name], DO ?PGY-3, [facility] ?07/18/2021  ? ? ? ? ? ? ? ? ?

## 2021-07-12 NOTE — Patient Instructions (Addendum)
We will recheck your a1c again.  ? ?It was great seeing you today! ? ?Visit Remembers: ?- Stop by the pharmacy to pick up your prescriptions  ?- Continue to work on your healthy eating habits and incorporating exercise into your daily life. (see below) ?- Your goal is to have an A1c < 7 ?- Medicine Changes: None at this time. Resume exercising and watching what you eat.  ? ? ?Diet Recommendations for Diabetes  ?Carbohydrate includes starch, sugar, and fiber.  Of these, only sugar and starch raise blood glucose.  (Fiber is found in fruits, vegetables [especially skin, seeds, and stalks] and whole grains.)   ?Starchy (carb) foods: Bread, rice, pasta, potatoes, corn, cereal, grits, crackers, bagels, muffins, all baked goods.  (Fruit, milk, and yogurt also have carbohydrate, but most of these foods will not spike your blood sugar as most starchy foods will.)  A few fruits do cause high blood sugars; use small portions of bananas (limit to 1/2 at a time), grapes, watermelon, oranges, and most tropical fruits.   ?Protein foods: Meat, fish, poultry, eggs, dairy foods, and beans such as pinto and kidney beans (beans also provide carbohydrate).  ? ?1. Eat at least REAL 3 meals and 1-2 snacks per day. Never go more than 4-5 hours while awake without eating. Eat breakfast within the first hour of getting up.   ?2. Limit starchy foods to TWO per meal and ONE per snack. ONE portion of a starchy food is equal to the following:  ? - ONE slice of bread (or its equivalent, such as half of a hamburger bun).  ? - 1/2 cup of a "scoopable" starchy food such as potatoes or rice.  ? - 15 grams of Total Carbohydrate as shown on food label.  ? - Every 4 ounces of a sweet drink (including fruit juice). ?3. Include at every meal: a protein food, a carb food, and vegetables and/or fruit.  ? - Obtain twice the volume of veg's as protein or carbohydrate foods for both lunch and dinner.  ? - Fresh or frozen veg's are best.  ? - Keep frozen veg's  on hand for a quick vegetable serving.    ?  ? ?Regarding lab work today:  ?Due to recent changes in healthcare laws, you may see the results of your imaging and laboratory studies on MyChart before your doctor has had a chance to review them.  We understand that in some cases there may be results that are confusing or concerning to you. Not all laboratory results come back in the same time frame and your doctor may be waiting for multiple results in order to interpret others.  Please give [place] 72 hours in order for your doctor to thoroughly review all the results before contacting the office for clarification of your results. If everything is normal, you will get a letter in the mail or a message in My Chart. Please give [place] a call if you do not hear from [place] after 2 weeks. ? ?Please bring all of your medications with you to each visit.  ?  ?If you haven't already, sign up for My Chart to have easy access to your labs results, and communication with your primary care physician. ? ?Feel free to call with any questions or concerns at any time, at [phone]. ?  ?Take care,  ?Dr. Susa Simmonds ?[place]  ?

## 2021-07-13 LAB — LIPID PANEL
Chol/HDL Ratio: 3.3 ratio (ref 0.0–4.4)
Cholesterol, Total: 117 mg/dL (ref 100–199)
HDL: 36 mg/dL — ABNORMAL LOW (ref >39)
LDL Chol Calc (NIH): 61 mg/dL (ref 0–99)
Triglycerides: 107 mg/dL (ref 0–149)
VLDL Cholesterol Cal: 20 mg/dL (ref 5–40)

## 2021-07-13 LAB — COMPREHENSIVE METABOLIC PANEL
ALT: 50 IU/L — ABNORMAL HIGH (ref 0–32)
AST: 30 IU/L (ref 0–40)
Albumin/Globulin Ratio: 1.8 (ref 1.2–2.2)
Albumin: 4.2 g/dL (ref 3.8–4.8)
Alkaline Phosphatase: 88 IU/L (ref 44–121)
BUN/Creatinine Ratio: 15 (ref 12–28)
BUN: 12 mg/dL (ref 8–27)
Bilirubin Total: 0.8 mg/dL (ref 0.0–1.2)
CO2: 24 mmol/L (ref 20–29)
Calcium: 9.6 mg/dL (ref 8.7–10.3)
Chloride: 105 mmol/L (ref 96–106)
Creatinine, Ser: 0.78 mg/dL (ref 0.57–1.00)
Globulin, Total: 2.3 g/dL (ref 1.5–4.5)
Glucose: 172 mg/dL — ABNORMAL HIGH (ref 70–99)
Potassium: 4 mmol/L (ref 3.5–5.2)
Sodium: 143 mmol/L (ref 134–144)
Total Protein: 6.5 g/dL (ref 6.0–8.5)
eGFR: 86 mL/min/{1.73_m2} (ref >59)

## 2021-07-13 LAB — TSH RFX ON ABNORMAL TO FREE T4: TSH: 2.1 u[IU]/mL (ref 0.450–4.500)

## 2021-07-13 LAB — HEMOGLOBIN A1C
Est. average glucose Bld gHb Est-mCnc: 223 mg/dL
Hgb A1c MFr Bld: 9.4 % — ABNORMAL HIGH (ref 4.8–5.6)

## 2021-07-16 ENCOUNTER — Ambulatory Visit: Payer: Medicare Other

## 2021-07-16 NOTE — Patient Instructions (Signed)
Visit Information ? ?Ms. Ruffini  it was nice speaking with you. Please call me directly [phone] if you have questions about the goals we discussed. ? ?Patient Goals/Self Care Activities: ?-Patient/Caregiver will self-administer medications as prescribed as evidenced by self-report/primary caregiver report  ?-Patient/Caregiver will attend all scheduled provider appointments as evidenced by clinician review of documented attendance to scheduled appointments and patient/caregiver report ?-Patient/Caregiver will call pharmacy for medication refills as evidenced by patient report and review of pharmacy fill history as appropriate ?-Patient/Caregiver will call provider office for new concerns or questions as evidenced by review of documented incoming telephone call notes and patient report ?-Patient/Caregiver verbalizes understanding of plan ?-Patient/Caregiver will focus on medication adherence by taking medications as prescribed ?-Patient will continue to get good nights sleep and monitor her food intake for weight management ?-Patient will continue to use other modalities like her rubs and heat to relive her pain ?  ? ? ?Patient verbalizes understanding of instructions and care plan provided today and agrees to view in [place]. Active MyChart status confirmed with patient.   ? ?Follow up Plan: Patient would like continued follow-up.  CCM RNCM will outreach the patient within the next 5 weeks.  Patient will call office if needed prior to next encounter ? ?Lazaro Arms, RN ? ?[phone]  ?

## 2021-07-16 NOTE — Chronic Care Management (AMB) (Signed)
?Chronic Care Management  ? ?CCM RN Visit Note ? ?07/16/2021 ?Name: KANIA REGNIER MRN: [id number] DOB: [birth year] ? ?Subjective: ?Lori Jones is a 61 y.o. year old female who is a primary care patient of [name], DO. The care management team was consulted for assistance with disease management and care coordination needs.   ? ?Engaged with patient by telephone for follow up visit in response to provider referral for case management and/or care coordination services.  ? ?Consent to Services:  ?The patient was given information about Chronic Care Management services, agreed to services, and gave verbal consent prior to initiation of services.  Please see initial visit note for detailed documentation.  ? ?Patient agreed to services and verbal consent obtained.  ? ? ?Summary:  The patient continues to maintain positive progress with care plan goals. See Care Plan below for interventions and patient self-care actives. ? ?Recommendation: The patient may benefit from taking medications as prescribed, exercising as tolerated, and The patient agrees. ? ?Follow up Plan: Patient would like continued follow-up.  CCM RNCM will outreach the patient within the next 5 weeks.  Patient will call office if needed prior to next encounter ? ? ?Assessment: Review of patient past medical history, allergies, medications, health status, including review of consultants reports, laboratory and other test data, was performed as part of comprehensive evaluation and provision of chronic care management services.  ? ?SDOH (Social Determinants of Health) assessments and interventions performed:   ? ?CCM Care Plan ? ?Allergies  ?Allergen Reactions  ? Clarithromycin Palpitations and Other (See Comments)  ?  hallucinations ?Other reaction(s): Other (See Comments) ?hullicinations ?  ? Sumatriptan Shortness Of Breath and Palpitations  ?  Feels like having a heart attack. ?Other reaction(s): Chest Pain  ? Canagliflozin Itching and Other (See  Comments)  ?  Likely Yeast/ Fungal Infection a few after starting.  Resolved with stopping medicine. Occurred with combination agent (canagliflozin with metformin).  Returned to taking metformin without any adverse effect.   ? Canagliflozin-Metformin Hcl Itching  ?  Likely Yeast/ Fungal Infection a few after starting.  Resolved with stopping medicine.   ? Chlorhexidine Rash  ? Sumatriptan Succinate Palpitations  ?  Other reaction(s): Respiratory Distress (ALLERGY/intolerance)  ? ? ?Outpatient Encounter Medications as of 07/16/2021  ?Medication Sig  ? aspirin EC 81 MG tablet Take 1 tablet (81 mg total) by mouth daily.  ? atorvastatin (LIPITOR) 40 MG tablet Take 1 tablet (40 mg total) by mouth daily.  ? baclofen (LIORESAL) 10 MG tablet Take 1 tablet (10 mg total) by mouth 3 (three) times daily.  ? blood glucose meter kit and supplies KIT Dispense based on patient and insurance preference. Use up to four times daily as directed.  ? escitalopram (LEXAPRO) 10 MG tablet Take 1 tablet (10 mg total) by mouth daily.  ? gabapentin (NEURONTIN) 100 MG capsule TAKE 1 CAPSULE(100 MG) BY MOUTH THREE TIMES DAILY  ? levothyroxine (SYNTHROID) 25 MCG tablet TAKE 1 TABLET(25 MCG) BY MOUTH DAILY BEFORE BREAKFAST  ? metFORMIN (GLUCOPHAGE) 1000 MG tablet Take 1 tablet (1,000 mg total) by mouth 2 (two) times daily with a meal.  ? naproxen (NAPROSYN) 500 MG tablet Take 1 tablet (500 mg total) by mouth 2 (two) times daily with a meal.  ? ?No facility-administered encounter medications on file as of 07/16/2021.  ? ? ?Patient Active Problem List  ? Diagnosis Date Noted  ? Pelvic kidney 11/29/2020  ? Right hip pain 11/29/2020  ? Unsteadiness 09/27/2020  ?  Ascending aorta dilatation ([place]) 09/22/2020  ? Idiopathic scoliosis 04/05/2020  ? Costochondritis 09/14/2019  ? Anxiety and depression 03/20/2018  ? Gastroesophageal reflux disease 03/20/2018  ? Atypical chest pain 10/04/2016  ? Hair loss 03/06/2016  ? Chronic back pain 02/08/2015  ? Left wrist  pain 12/17/2013  ? HLD (hyperlipidemia) 10/06/2013  ? Hypothyroidism 08/06/2013  ? Schizoaffective disorder, unspecified type ([place]) 07/10/2013  ? Diabetes mellitus without complication ([place]) 87/56/4332  ? Heart murmur 07/10/2013  ? ? ?Conditions to be addressed/monitored: Back Pain ? ?Care Plan : RN Case Manager  ?Updates made by Lazaro Arms, RN since 07/16/2021 12:00 AM  ?  ? ?Problem: General Plan of Care in a patient with Chronic Back Pain   ?  ? ?Long-Range Goal: The patient will  learn and use medicinal and non pharmaceutcal ways to relieve pain.   ?Start Date: 06/04/2021  ?Expected End Date: 08/29/2021  ?Priority: High  ?Note:   ?Current Barriers:  ?Chronic Disease Management support and education needs related to Chronic Back Pain ? ?RNCM Clinical Goal(s):  ?Patient will demonstrate improved adherence to prescribed treatment plan for Chronic Back Pain as evidenced by 3/10 pain score  through collaboration with RN Care manager, provider, and care team.  ? ?Interventions: ?1:1 collaboration with primary care provider regarding development and update of comprehensive plan of care as evidenced by provider attestation and co-signature ?Inter-disciplinary care team collaboration (see longitudinal plan of care) ?Evaluation of current treatment plan related to  self management and patient's adherence to plan as established by provider ? ? ?Pain:  (Status: Goal on Track (progressing): YES.) Long Term Goal  ?Pain assessment performed- 0/10. ?Reviewed provider established plan for pain management; ?Counseled on the importance of reporting any/all new or changed pain symptoms or management strategies to pain management provider; ?Discussed use of relaxation techniques and/or diversional activities to assist with pain reduction (distraction, imagery, relaxation, heat, and cold application; ?Reviewed with patient prescribed pharmacological and nonpharmacological pain relief strategies; ?07/16/21:  I spoke with Mrs. Landing  today, and she said she is doing well.  She denies having any pain; she had taken her medication for pain earlier.  She said it was disappointing that she could not get any help purchasing the lift chair, so she bought it herself.  She feels that it is vital to have to help her back.  She has started physical therapy but did not like her first encounter and will need to switch therapists.  She is going to Neurology and PT appointment on Thursday, 07/19/21. ? ? ?Patient Goals/Self Care Activities: ?-Patient/Caregiver will self-administer medications as prescribed as evidenced by self-report/primary caregiver report  ?-Patient/Caregiver will attend all scheduled provider appointments as evidenced by clinician review of documented attendance to scheduled appointments and patient/caregiver report ?-Patient/Caregiver will call pharmacy for medication refills as evidenced by patient report and review of pharmacy fill history as appropriate ?-Patient/Caregiver will call provider office for new concerns or questions as evidenced by review of documented incoming telephone call notes and patient report ?-Patient/Caregiver verbalizes understanding of plan ?-Patient/Caregiver will focus on medication adherence by taking medications as prescribed ?-Patient will continue to get good nights sleep and monitor her food intake for weight management ?-Patient will continue to use other modalities like her rubs and heat to relive her pain ?  ? ?Lazaro Arms RN, BSN, [place] ?Care Management Coordinator ?[place]  ?Phone: [phone]  ?  ? ? ? ?

## 2021-07-18 NOTE — Assessment & Plan Note (Signed)
Follows with neurosurgery.  Refill gabapentin 100 mg 3 times daily as needed ?

## 2021-07-18 NOTE — Assessment & Plan Note (Addendum)
Uncontrolled. Continue current regimen: Metformin 1000 mg BID. POC A1c today 9.2 and previously 7.0 in [place].  Given the home A1c was reported to be 6.8, will have confirmatory a1c sent-out.  She does not wish to make any medication changes at this time.  She was frankly in shock due to her A1c results.  Encouraged continued diet rich in vegetables and complex carbs.  Heart healthy carb modified diet. Counseled on need to continue exercising as tolerated ? ?Statin therapy: Lipitor 40 mg ?ACEi/ARB: Currently none, consider at follow-up for renal protection ?Urine microalbumine: due in June 2023 ?Eye exam: advised to get yearly eye exam  ? ?Obtain send out A1c, lipid panel, CMP ?

## 2021-07-18 NOTE — Therapy (Signed)
?OUTPATIENT PHYSICAL THERAPY TREATMENT NOTE ? ? ?Patient Name: Lori Jones ?MRN: [id number] ?DOB:[birth year], 61 y.o., female ?Today's Date: 07/19/2021 ? ?PCP: [name], DO ?REFERRING PROVIDER: Earnie Larsson, MD ? ?END OF SESSION:  ? PT End of Session - 07/19/21 1053   ? ? Visit Number 3   ? Number of Visits 9   ? Date for PT Re-Evaluation 08/27/21   ? Authorization Type UHC/MCR/MCD  6th visit and 10th FOTO  10th visit progress note   ? Progress Note Due on Visit 10   ? PT Start Time 4098   Pt arrived 7 minues late  ? PT Stop Time 1130   ? PT Time Calculation (min) 38 min   ? Activity Tolerance Patient limited by pain   ? Behavior During Therapy [facility] for tasks assessed/performed   ? ?  ?  ? ?  ? ? ? ?Past Medical History:  ?Diagnosis Date  ? Allergy   ? Anemia   ? Anginal pain ([place])   ? hx of CPs  ? Anxiety   ? Arthritis   ? Ascending aorta dilatation (HCC)   ? 18m by echo 08/2020  ? Asthma   ? hx of in childhood   ? Back pain, chronic   ? Bursitis   ? Depression   ? Diabetes mellitus   ? Foot fracture, right   ? Heart murmur   ? Herniated disc   ? Hypercholesteremia   ? Hypertension   ? no meds  ? Hypothyroid   ? Kidney disease   ? "pelvic kidney" bilat   ? Scoliosis   ? Seizures ([place]   ? hx of in childhood and again in college   ? Stroke ([facility]   ? hx of sun stroke while in college   ? ?Past Surgical History:  ?Procedure Laterality Date  ? ABDOMINAL HYSTERECTOMY    ? CESAREAN SECTION    ? CHOLECYSTECTOMY N/A 09/08/2014  ? Procedure: LAPAROSCOPIC CHOLECYSTECTOMY;  Surgeon: DCoralie Keens MD;  Location: [facility];  Service: General;  Laterality: N/A;  ? HAND SURGERY    ? dog bite  ? INCISIONAL HERNIA REPAIR N/A 04/24/2016  ? Procedure: HERNIA REPAIR INCISIONAL;  Surgeon: DCoralie Keens MD;  [place]  Service: General;  Laterality: N/A;  ? INSERTION OF MESH N/A 04/24/2016  ? Procedure: INSERTION OF MESH;  Surgeon: DCoralie Keens MD;  [place]  Service: General;  Laterality: N/A;  ? Thumb surgery     ? VENTRAL HERNIA REPAIR N/A 09/08/2014  ? Procedure: VENTRAL HERNIA REPAIR;  Surgeon: DCoralie Keens MD;  [facility];  Service: General;  Laterality: N/A;  ? ?Patient Active Problem List  ? Diagnosis Date Noted  ? Pelvic kidney 11/29/2020  ? Right hip pain 11/29/2020  ? Unsteadiness 09/27/2020  ? Ascending aorta dilatation ([place] 09/22/2020  ? Idiopathic scoliosis 04/05/2020  ? Costochondritis 09/14/2019  ? Anxiety and depression 03/20/2018  ? Gastroesophageal reflux disease 03/20/2018  ? Atypical chest pain 10/04/2016  ? Hair loss 03/06/2016  ? Chronic back pain 02/08/2015  ? Left wrist pain 12/17/2013  ? HLD (hyperlipidemia) 10/06/2013  ? Hypothyroidism 08/06/2013  ? Schizoaffective disorder, unspecified type ([place] 07/10/2013  ? Diabetes mellitus without complication ([place] 011/91/4782 ? Heart murmur 07/10/2013  ? ? ?REFERRING DIAG: M54.16 (ICD-10-CM) - Radiculopathy, lumbar region ? ?THERAPY DIAG:  ?Chronic low back pain without sciatica, unspecified back pain laterality ? ?Difficulty in walking, not elsewhere classified ? ?Muscle spasm of back ? ?Pain  in thoracic spine ? ?PERTINENT HISTORY: Allergy, anemia, Hx of Chest pain, anxiety, OA, Asthma, Chronic back pain, depression , anginal pain, DM, heart murmur, foot (5th metatarsal fx, herniated disc, HTN, hypothryroiid, Scoliosis, seizures, Stroke,schizoaffective disorder ? ?PRECAUTIONS: none ? ?SUBJECTIVE: Patient presents to PT with soreness in her back. She reports she had over 27,000 steps yesterday and felt like she over did it and she didn't sleep well last night. ? ?PAIN:  ?Are you having pain? Yes: NPRS scale: 3/10 ?Pain location: low back and numbness in bil inner thights ?Pain description: tingling numb in bil inner thighs and back and  ?Aggravating factors: standing and walking too long. When I dont have back support.  Cant drive past 1 hour, washing dishes , household chores and making beds one sheet at a time,   ?Relieving factors:  gabapentin ? ? ? ?OBJECTIVE:  ?  ?DIAGNOSTIC FINDINGS:  ?  ?IMPRESSION: 05-04-21 ?1. Significant levocurvature in the lumbar spine centered at L2. ?2. Right foraminal/extraforaminal protrusion at L3-L4 resulting in ?moderate right neural foraminal stenosis with possible compression ?of the exiting L3 nerve root. ?3. Narrowing of the right subarticular zone at L2-L3 with possible ?irritation of the traversing right L3 nerve root. ?4. Mild narrowing of the right subarticular zone at L1-L2 without ?evidence of nerve root impingement. ?5. Mild multilevel facet arthropathy throughout the lumbar spine. ?  ?PATIENT SURVEYS:  ?FOTO 37%  predicted 48% ?  ?SCREENING FOR RED FLAGS: ?Bowel or bladder incontinence: No ?Spinal tumors: No ?Cauda equina syndrome: No ?Compression fracture: No ?Abdominal aneurysm: No ?  ?COGNITION: ?          Overall cognitive status: Within functional limits for tasks assessed               ?           ?SENSATION: ?WFL for low back  pt has decreased sensation for L>R hands  carpal tunnel ?  ?MUSCLE LENGTH: ?Hamstrings: EFL ?Thomas test: Right 40 deg; Left 43 deg ?  ?POSTURE:  ?Pt leans to left in gait and standing.  Pelvic level elevated on R side. Levoscoliosis,  ?  ?PALPATION: ?TTP over R >L QL and Lumbar paraspinals R >L pain ?  ?LUMBAR ROM:  ?  ?Active  A/PROM  ?07/02/2021  ?Flexion Fingertips to knees bil  ?Extension 50%  ?Right lateral flexion Knee jt pain  ?Left lateral flexion Knee jt no pain  ?Right rotation 60%  ?Left rotation 60%  ? (Blank rows = not tested) ?  ?LE ROM:  Pt with tightness and pulling with flexion and R side lateral  and extension ?  ?Active  Right ?07/02/2021 Left ?07/02/2021  ?Hip flexion 100 100  ?Hip extension      ?Hip abduction      ?Hip adduction      ?Hip internal rotation 23 25  ?Hip external rotation 25 26  ?Knee flexion      ?Knee extension      ?Ankle dorsiflexion      ?Ankle plantarflexion      ?Ankle inversion      ?Ankle eversion      ? (Blank rows = not tested) ?   ?LE MMT:  grossly 4/5 in bil LE    5 X STS 19.55 sec ?  ?MMT Right ?07/02/2021 Left ?07/02/2021  ?Hip flexion 4 4  ?Hip extension 4- 4-  ?Hip abduction 3- 4-  ?Hip adduction      ?Hip internal rotation      ?  Hip external rotation      ?Knee flexion      ?Knee extension      ?Ankle dorsiflexion      ?Ankle plantarflexion      ?Ankle inversion      ?Ankle eversion      ? (Blank rows = not tested) ?  ?LUMBAR SPECIAL TESTS:  ?Straight leg raise test: Negative, Slump test: Negative, SI Compression/distraction test: Negative, and Thomas test: Positive ?  ?FUNCTIONAL TESTS:  ?5 times sit to stand: 19.55 ?6 minute walk test: TBA ?  ?Pt bears wt to left with sit to stand ?Unable to squat more than 25 % range and bear wt  to Left ?GAIT: ?Distance walked: 150 ?Assistive device utilized:  used no AD but did bring SPC ?Level of assistance: Modified independence ?Comments:  2.97 ft/sec gait velocity.  Pt with left trunk lean with antalgic gait ?  ?  ?  ?TODAY'S TREATMENT  ?[facility] Adult PT Treatment:                                                DATE: 07/19/2021 ?Therapeutic Exercise: ?Nustep level 6 x 5 mins while gathering subjective ?Seated rows RTB 2x10 ?Seated ER with scapular retraction 2x10 ?Supine PPT 5" hold 2x10 ?Supine hip adduction ball squeeze 5" hold 2x10 ?Supine SKTC x30" BIL ?Supine DKTC on red Pball 2x10 (at end, began to have muscle cramp on L inner thigh) ?Bridges 2x10 ? ? ?[facility] Adult PT Treatment:                                                DATE: 07-10-21 ?6MWT 1444 ft ( normal for age 88) ?Therapeutic Exercise: ? ?Supine Pelvic Tilt  - 3 x 10  moderate VC necessary ?Supine Single Knee to Chest Stretch  2 x 30 sec hold each R and Left ?Supine Lower Trunk Rotation  - 1-2 x daily - 7 x weekly - 1 sets - 5 reps - 20 hold ?Supine Bridge   1 x 5  reps 3 sec hold pt with difficulty and needing much encouragement ?Bent knee fall out 2 x 10 R and Left ?Supine marching 2 x 10 R and L ?Sit to stand with sink support  Movement snack  - 1 x daily - 7 x weekly - 3 sets - 10 reps ?Star pattern sub scapular stabilizers with rest between LE exercises for abdominal engagement ? ?Manual-  LAD for bil LE with decreased pain.  Sitting QL S

## 2021-07-18 NOTE — Assessment & Plan Note (Signed)
Obtain TSH today.  Adjust levothyroxine as needed ?

## 2021-07-19 ENCOUNTER — Encounter: Payer: Medicare Other | Admitting: Physical Therapy

## 2021-07-19 ENCOUNTER — Ambulatory Visit: Payer: Medicare Other

## 2021-07-19 DIAGNOSIS — R262 Difficulty in walking, not elsewhere classified: Secondary | ICD-10-CM

## 2021-07-19 DIAGNOSIS — M545 Low back pain, unspecified: Secondary | ICD-10-CM | POA: Diagnosis not present

## 2021-07-19 DIAGNOSIS — M62830 Muscle spasm of back: Secondary | ICD-10-CM | POA: Diagnosis not present

## 2021-07-19 DIAGNOSIS — M5416 Radiculopathy, lumbar region: Secondary | ICD-10-CM | POA: Diagnosis not present

## 2021-07-19 DIAGNOSIS — G8929 Other chronic pain: Secondary | ICD-10-CM | POA: Diagnosis not present

## 2021-07-19 DIAGNOSIS — M546 Pain in thoracic spine: Secondary | ICD-10-CM

## 2021-07-19 DIAGNOSIS — M6281 Muscle weakness (generalized): Secondary | ICD-10-CM | POA: Diagnosis not present

## 2021-07-19 DIAGNOSIS — M79642 Pain in left hand: Secondary | ICD-10-CM | POA: Diagnosis not present

## 2021-07-22 NOTE — Therapy (Signed)
?OUTPATIENT PHYSICAL THERAPY TREATMENT NOTE ? ? ?Patient Name: Lori Jones ?MRN: [id number] ?DOB:1960/09/28, 61 y.o., female ?Today's Date: 07/23/2021 ? ?PCP: [name], DO ?REFERRING PROVIDER: [name], DO ? ?END OF SESSION:  ? PT End of Session - 07/23/21 1333   ? ? Visit Number 4   ? Number of Visits 9   ? Date for PT Re-Evaluation 08/27/21   ? Authorization Type UHC/MCR/MCD  6th visit and 10th FOTO  10th visit progress note   ? PT Start Time 1330   ? PT Stop Time 2355   ? PT Time Calculation (min) 45 min   ? Activity Tolerance Patient tolerated treatment well   ? Behavior During Therapy [facility] for tasks assessed/performed   ? ?  ?  ? ?  ? ? ? ? ?Past Medical History:  ?Diagnosis Date  ? Allergy   ? Anemia   ? Anginal pain ([place])   ? hx of CPs  ? Anxiety   ? Arthritis   ? Ascending aorta dilatation (HCC)   ? 17m by echo 08/2020  ? Asthma   ? hx of in childhood   ? Back pain, chronic   ? Bursitis   ? Depression   ? Diabetes mellitus   ? Foot fracture, right   ? Heart murmur   ? Herniated disc   ? Hypercholesteremia   ? Hypertension   ? no meds  ? Hypothyroid   ? Kidney disease   ? "pelvic kidney" bilat   ? Scoliosis   ? Seizures ([place]   ? hx of in childhood and again in college   ? Stroke ([facility]   ? hx of sun stroke while in college   ? ?Past Surgical History:  ?Procedure Laterality Date  ? ABDOMINAL HYSTERECTOMY    ? CESAREAN SECTION    ? CHOLECYSTECTOMY N/A 09/08/2014  ? Procedure: LAPAROSCOPIC CHOLECYSTECTOMY;  Surgeon: DCoralie Keens MD;  [facility];  Service: General;  Laterality: N/A;  ? HAND SURGERY    ? dog bite  ? INCISIONAL HERNIA REPAIR N/A 04/24/2016  ? Procedure: HERNIA REPAIR INCISIONAL;  Surgeon: DCoralie Keens MD;  [place]  Service: General;  Laterality: N/A;  ? INSERTION OF MESH N/A 04/24/2016  ? Procedure: INSERTION OF MESH;  Surgeon: DCoralie Keens MD;  [place]  Service: General;  Laterality: N/A;  ? Thumb surgery    ? VENTRAL HERNIA REPAIR N/A 09/08/2014  ?  Procedure: VENTRAL HERNIA REPAIR;  Surgeon: DCoralie Keens MD;  [facility];  Service: General;  Laterality: N/A;  ? ?Patient Active Problem List  ? Diagnosis Date Noted  ? Pelvic kidney 11/29/2020  ? Right hip pain 11/29/2020  ? Unsteadiness 09/27/2020  ? Ascending aorta dilatation ([place] 09/22/2020  ? Idiopathic scoliosis 04/05/2020  ? Costochondritis 09/14/2019  ? Anxiety and depression 03/20/2018  ? Gastroesophageal reflux disease 03/20/2018  ? Atypical chest pain 10/04/2016  ? Hair loss 03/06/2016  ? Chronic back pain 02/08/2015  ? Left wrist pain 12/17/2013  ? HLD (hyperlipidemia) 10/06/2013  ? Hypothyroidism 08/06/2013  ? Schizoaffective disorder, unspecified type ([place] 07/10/2013  ? Diabetes mellitus without complication ([place] 073/22/0254 ? Heart murmur 07/10/2013  ? ? ?REFERRING DIAG: M54.16 (ICD-10-CM) - Radiculopathy, lumbar region ? ?THERAPY DIAG:  ?Chronic low back pain without sciatica, unspecified back pain laterality ? ?Difficulty in walking, not elsewhere classified ? ?Muscle spasm of back ? ?Pain in thoracic spine ? ?Muscle weakness (generalized) ? ?PERTINENT HISTORY: Allergy, anemia, Hx of  Chest pain, anxiety, OA, Asthma, Chronic back pain, depression , anginal pain, DM, heart murmur, foot (5th metatarsal fx, herniated disc, HTN, hypothryroiid, Scoliosis, seizures, Stroke,schizoaffective disorder ? ?PRECAUTIONS: none ? ?SUBJECTIVE:  ?Still recovering from last week, overdid it with walking.  Trying to control her blood sugar and A1C. She is having wrist surgery May 8 (Carpal tunnel)  ? ?PAIN:  ?Are you having pain? Yes: NPRS scale: 0/10 ?Pain location: low back  ?Pain description: tingling numb in bil inner thighs and back and  ?Aggravating factors: standing and walking too long. When I dont have back support.  Cant drive past 1 hour, washing dishes , household chores and making beds one sheet at a time,   ?Relieving factors: gabapentin ? ? ? ?OBJECTIVE:  ?  ?DIAGNOSTIC FINDINGS:  ?   ?IMPRESSION: 05-04-21 ?1. Significant levocurvature in the lumbar spine centered at L2. ?2. Right foraminal/extraforaminal protrusion at L3-L4 resulting in ?moderate right neural foraminal stenosis with possible compression ?of the exiting L3 nerve root. ?3. Narrowing of the right subarticular zone at L2-L3 with possible ?irritation of the traversing right L3 nerve root. ?4. Mild narrowing of the right subarticular zone at L1-L2 without ?evidence of nerve root impingement. ?5. Mild multilevel facet arthropathy throughout the lumbar spine. ?  ?PATIENT SURVEYS:  ?FOTO 37%  predicted 48% ?  ?SCREENING FOR RED FLAGS: ?Bowel or bladder incontinence: No ?Spinal tumors: No ?Cauda equina syndrome: No ?Compression fracture: No ?Abdominal aneurysm: No ?  ?COGNITION: ?          Overall cognitive status: Within functional limits for tasks assessed               ?           ?SENSATION: ?WFL for low back  pt has decreased sensation for L>R hands  carpal tunnel ?  ?MUSCLE LENGTH: ?Hamstrings: EFL ?Thomas test: Right 40 deg; Left 43 deg ?  ?POSTURE:  ?Pt leans to left in gait and standing.  Pelvic level elevated on R side. Levoscoliosis,  ?  ?PALPATION: ?TTP over R >L QL and Lumbar paraspinals R >L pain ?  ?LUMBAR ROM:  ?  ?Active  A/PROM  ?07/02/2021  ?Flexion Fingertips to knees bil  ?Extension 50%  ?Right lateral flexion Knee jt pain  ?Left lateral flexion Knee jt no pain  ?Right rotation 60%  ?Left rotation 60%  ? (Blank rows = not tested) ?  ?LE ROM:  Pt with tightness and pulling with flexion and R side lateral  and extension ?  ?Active  Right ?07/02/2021 Left ?07/02/2021  ?Hip flexion 100 100  ?Hip extension      ?Hip abduction      ?Hip adduction      ?Hip internal rotation 23 25  ?Hip external rotation 25 26  ?Knee flexion      ?Knee extension      ?Ankle dorsiflexion      ?Ankle plantarflexion      ?Ankle inversion      ?Ankle eversion      ? (Blank rows = not tested) ?  ?LE MMT:  grossly 4/5 in bil LE    5 X STS 19.55 sec ?   ?MMT Right ?07/02/2021 Left ?07/02/2021  ?Hip flexion 4 4  ?Hip extension 4- 4-  ?Hip abduction 3- 4-  ?Hip adduction      ?Hip internal rotation      ?Hip external rotation      ?Knee flexion      ?  Knee extension      ?Ankle dorsiflexion      ?Ankle plantarflexion      ?Ankle inversion      ?Ankle eversion      ? (Blank rows = not tested) ?  ?LUMBAR SPECIAL TESTS:  ?Straight leg raise test: Negative, Slump test: Negative, SI Compression/distraction test: Negative, and Thomas test: Positive ?  ?FUNCTIONAL TESTS:  ?5 times sit to stand: 19.55 ?6 minute walk test: TBA ?  ?Pt bears wt to left with sit to stand ?Unable to squat more than 25 % range and bear wt  to Left ?GAIT: ?Distance walked: 150 ?Assistive device utilized:  used no AD but did bring SPC ?Level of assistance: Modified independence ?Comments:  2.97 ft/sec gait velocity.  Pt with left trunk lean with antalgic gait ?  ?  ?  ?TODAY'S TREATMENT  ? ?[facility] Adult PT Treatment:                                                DATE: 07/23/21 ?Therapeutic Exercise: ?NuStep L6 UE and LE for  6 min  ?Sit to stand no UE x 10  ?STS with 10 lbs KB  ?Squat x 10 with 10 lb KB ?Hip hinge dowel for hands then cue for back ?Hinge with 10 lbs multiple reps, cues mostly for neck and upper back ?Supine pelvic tilt x 8 cues  ?Bridge x 8 cues for breathing ?Knee to chest x 2  ?Lower trunk rotation x 10 cues  ?Therapeutic Activity: ?Lifting from floor, body mechanics ?Hip hinge and squat ?Leg length , scoliosis ?Golfers lift ? ?[facility] Adult PT Treatment:                                                DATE: 07/19/2021 ?Therapeutic Exercise: ?Nustep level 6 x 5 mins while gathering subjective ?Seated rows RTB 2x10 ?Seated ER with scapular retraction 2x10 ?Supine PPT 5" hold 2x10 ?Supine hip adduction ball squeeze 5" hold 2x10 ?Supine SKTC x30" BIL ?Supine DKTC on red Pball 2x10 (at end, began to have muscle cramp on L inner thigh) ?Bridges 2x10 ? ? ?[facility] Adult PT Treatment:                                                 DATE: 07-10-21 ?6MWT 1444 ft ( normal for age 60) ?Therapeutic Exercise: ? ?Supine Pelvic Tilt  - 3 x 10  moderate VC necessary ?Supine Single Knee to Chest Stretch  2 x 30 sec hol

## 2021-07-23 ENCOUNTER — Ambulatory Visit: Payer: Medicare Other | Admitting: Physical Therapy

## 2021-07-23 ENCOUNTER — Encounter: Payer: Medicare Other | Admitting: Physical Therapy

## 2021-07-23 ENCOUNTER — Encounter: Payer: Self-pay | Admitting: Physical Therapy

## 2021-07-23 DIAGNOSIS — M546 Pain in thoracic spine: Secondary | ICD-10-CM

## 2021-07-23 DIAGNOSIS — M62830 Muscle spasm of back: Secondary | ICD-10-CM

## 2021-07-23 DIAGNOSIS — G8929 Other chronic pain: Secondary | ICD-10-CM

## 2021-07-23 DIAGNOSIS — M545 Low back pain, unspecified: Secondary | ICD-10-CM | POA: Diagnosis not present

## 2021-07-23 DIAGNOSIS — R262 Difficulty in walking, not elsewhere classified: Secondary | ICD-10-CM | POA: Diagnosis not present

## 2021-07-23 DIAGNOSIS — M6281 Muscle weakness (generalized): Secondary | ICD-10-CM | POA: Diagnosis not present

## 2021-07-23 DIAGNOSIS — M79642 Pain in left hand: Secondary | ICD-10-CM | POA: Diagnosis not present

## 2021-07-30 ENCOUNTER — Other Ambulatory Visit: Payer: Self-pay | Admitting: Student

## 2021-07-30 ENCOUNTER — Other Ambulatory Visit: Payer: Self-pay | Admitting: Family Medicine

## 2021-07-30 DIAGNOSIS — E785 Hyperlipidemia, unspecified: Secondary | ICD-10-CM

## 2021-07-30 DIAGNOSIS — F32A Depression, unspecified: Secondary | ICD-10-CM

## 2021-07-30 DIAGNOSIS — E089 Diabetes mellitus due to underlying condition without complications: Secondary | ICD-10-CM

## 2021-08-01 ENCOUNTER — Ambulatory Visit: Payer: Medicare Other

## 2021-08-01 NOTE — Therapy (Signed)
?OUTPATIENT PHYSICAL THERAPY TREATMENT NOTE ? ? ?Patient Name: Lori Jones ?MRN: [id number] ?DOB:1960/07/17, 61 y.o., female ?Today's Date: 08/02/2021 ? ?PCP: [name], DO ?REFERRING PROVIDER: [name], DO ? ?END OF SESSION:  ? PT End of Session - 08/02/21 0917   ? ? Visit Number 5   ? Number of Visits 9   ? Date for PT Re-Evaluation 08/27/21   ? Authorization Type UHC/MCR/MCD, 6th visit and 10th FOTO, 10th visit progress note   ? Progress Note Due on Visit 10   ? PT Start Time 3419   ? PT Stop Time 0955   ? PT Time Calculation (min) 40 min   ? Activity Tolerance Patient tolerated treatment well   ? Behavior During Therapy [facility] for tasks assessed/performed   ? ?  ?  ? ?  ? ? ? ? ? ?Past Medical History:  ?Diagnosis Date  ? Allergy   ? Anemia   ? Anginal pain ([place])   ? hx of CPs  ? Anxiety   ? Arthritis   ? Ascending aorta dilatation (HCC)   ? 98m by echo 08/2020  ? Asthma   ? hx of in childhood   ? Back pain, chronic   ? Bursitis   ? Depression   ? Diabetes mellitus   ? Foot fracture, right   ? Heart murmur   ? Herniated disc   ? Hypercholesteremia   ? Hypertension   ? no meds  ? Hypothyroid   ? Kidney disease   ? "pelvic kidney" bilat   ? Scoliosis   ? Seizures ([place]   ? hx of in childhood and again in college   ? Stroke ([facility]   ? hx of sun stroke while in college   ? ?Past Surgical History:  ?Procedure Laterality Date  ? ABDOMINAL HYSTERECTOMY    ? CESAREAN SECTION    ? CHOLECYSTECTOMY N/A 09/08/2014  ? Procedure: LAPAROSCOPIC CHOLECYSTECTOMY;  Surgeon: DCoralie Keens MD;  Location: [facility];  Service: General;  Laterality: N/A;  ? HAND SURGERY    ? dog bite  ? INCISIONAL HERNIA REPAIR N/A 04/24/2016  ? Procedure: HERNIA REPAIR INCISIONAL;  Surgeon: DCoralie Keens MD;  [facility]  Service: General;  Laterality: N/A;  ? INSERTION OF MESH N/A 04/24/2016  ? Procedure: INSERTION OF MESH;  Surgeon: DCoralie Keens MD;  [facility]  Service: General;  Laterality: N/A;  ? Thumb surgery    ?  VENTRAL HERNIA REPAIR N/A 09/08/2014  ? Procedure: VENTRAL HERNIA REPAIR;  Surgeon: DCoralie Keens MD;  Location: [facility];  Service: General;  Laterality: N/A;  ? ?Patient Active Problem List  ? Diagnosis Date Noted  ? Pelvic kidney 11/29/2020  ? Right hip pain 11/29/2020  ? Unsteadiness 09/27/2020  ? Ascending aorta dilatation ([place] 09/22/2020  ? Idiopathic scoliosis 04/05/2020  ? Costochondritis 09/14/2019  ? Anxiety and depression 03/20/2018  ? Gastroesophageal reflux disease 03/20/2018  ? Atypical chest pain 10/04/2016  ? Hair loss 03/06/2016  ? Chronic back pain 02/08/2015  ? Left wrist pain 12/17/2013  ? HLD (hyperlipidemia) 10/06/2013  ? Hypothyroidism 08/06/2013  ? Schizoaffective disorder, unspecified type ([place] 07/10/2013  ? Diabetes mellitus without complication ([place] 062/22/9798 ? Heart murmur 07/10/2013  ? ? ?REFERRING DIAG: Radiculopathy, lumbar region ? ?THERAPY DIAG:  ?Chronic low back pain without sciatica, unspecified back pain laterality ? ?Difficulty in walking, not elsewhere classified ? ?Muscle spasm of back ? ?Pain in thoracic spine ? ?Muscle weakness (generalized) ? ?PERTINENT  HISTORY: Allergy, anemia, Hx of Chest pain, anxiety, OA, Asthma, Chronic back pain, depression , anginal pain, DM, heart murmur, foot (5th metatarsal fx, herniated disc, HTN, hypothryroiid, Scoliosis, seizures, Stroke,schizoaffective disorder ? ?PRECAUTIONS: none ? ? ?SUBJECTIVE:  ?Patient reports she is feeling good, she has been feeling good the past 2 weeks. She is having surgery on her left hand on Monday May 8th. ? ?PAIN:  ?Are you having pain? Yes:  ?NPRS scale: 0/10 ?Pain location: Low back  ?Pain description: Tingling numb in bil inner thighs and back and  ?Aggravating factors: standing and walking too long. When I dont have back support.  Cant drive past 1 hour, washing dishes , household chores and making beds one sheet at a time,   ?Relieving factors: gabapentin ? ? ? ?OBJECTIVE:  ?PATIENT SURVEYS:  ?FOTO 37%   predicted 48% ?  ?MUSCLE LENGTH: ?Hamstrings: EFL ?Thomas test: Right 40 deg; Left 43 deg ?  ?POSTURE:  ?Pt leans to left in gait and standing.  Pelvic level elevated on R side. Levoscoliosis,  ?  ?PALPATION: ?TTP over R >L QL and Lumbar paraspinals R >L pain ?  ?LUMBAR ROM:  ?  ?Active  A/PROM  ?07/02/2021  ?Flexion Fingertips to knees bil  ?Extension 50%  ?Right lateral flexion Knee jt pain  ?Left lateral flexion Knee jt no pain  ?Right rotation 60%  ?Left rotation 60%  ? (Blank rows = not tested) ?  ?LE ROM:  Pt with tightness and pulling with flexion and R side lateral  and extension ?  ?Active  Right ?07/02/2021 Left ?07/02/2021  ?Hip flexion 100 100  ?Hip extension      ?Hip abduction      ?Hip adduction      ?Hip internal rotation 23 25  ?Hip external rotation 25 26  ?Knee flexion      ?Knee extension      ?Ankle dorsiflexion      ?Ankle plantarflexion      ?Ankle inversion      ?Ankle eversion      ? (Blank rows = not tested) ?  ?LE MMT:  grossly 4/5 in bil LE    5 X STS 19.55 sec ?  ?MMT Right ?07/02/2021 Left ?07/02/2021  ?Hip flexion 4 4  ?Hip extension 4- 4-  ?Hip abduction 3- 4-  ?Hip adduction      ?Hip internal rotation      ?Hip external rotation      ?Knee flexion      ?Knee extension      ?Ankle dorsiflexion      ?Ankle plantarflexion      ?Ankle inversion      ?Ankle eversion      ? (Blank rows = not tested) ?  ?LUMBAR SPECIAL TESTS:  ?Straight leg raise test: Negative, Slump test: Negative, SI Compression/distraction test: Negative, and Thomas test: Positive ?  ?FUNCTIONAL TESTS:  ?5 times sit to stand: 19.55 ?6 minute walk test: TBA ?  ?Pt bears wt to left with sit to stand ?Unable to squat more than 25 % range and bear wt  to Left ? ?GAIT: ?Distance walked: 150 ?Assistive device utilized:  used no AD but did bring SPC ?Level of assistance: Modified independence ?Comments:  2.97 ft/sec gait velocity.  Pt with left trunk lean with antalgic gait ?  ?  ?  ?TODAY'S TREATMENT  ?[facility] Adult PT Treatment:  DATE: 08/02/21 ?Therapeutic Exercise: ?NuStep L6 x 6 min with UE/LE while taking subjective ?LTR 10 x 5 sec each - limitation moving LE to left ?Piriformis stretch 2 x 20 sec each ?PPT 10 x 5 sec hold - cued for breathing ?SLR with focus on abdominal engagement x 10 each ?Bridge 2 x 5 - partial range, cues for gluteal engagement ?Sidelying hip abduction 2 x 10 each ?Sit to stand without UE assist 2 x 10 - cues for forward trunk lean and hip hinge ? ? ?[facility] Adult PT Treatment:                                                DATE: 07/23/21 ?Therapeutic Exercise: ?NuStep L6 UE and LE for  6 min  ?Sit to stand no UE x 10  ?STS with 10 lbs KB  ?Squat x 10 with 10 lb KB ?Hip hinge dowel for hands then cue for back ?Hinge with 10 lbs multiple reps, cues mostly for neck and upper back ?Supine pelvic tilt x 8 cues  ?Bridge x 8 cues for breathing ?Knee to chest x 2  ?Lower trunk rotation x 10 cues  ?Therapeutic Activity: ?Lifting from floor, body mechanics ?Hip hinge and squat ?Leg length , scoliosis ?Golfers lift ? ?[facility] Adult PT Treatment:                                                DATE: 07/19/2021 ?Therapeutic Exercise: ?Nustep level 6 x 5 mins while gathering subjective ?Seated rows RTB 2x10 ?Seated ER with scapular retraction 2x10 ?Supine PPT 5" hold 2x10 ?Supine hip adduction ball squeeze 5" hold 2x10 ?Supine SKTC x30" BIL ?Supine DKTC on red Pball 2x10 (at end, began to have muscle cramp on L inner thigh) ?Bridges 2x10 ?  ?PATIENT EDUCATION:  ?Education details: HEP update ?Person educated: Patient ?Education method: Explanation, Demonstration, Tactile cues, Verbal cues, and Handouts ?Education comprehension: verbalized understanding, returned demonstration, verbal cues required, tactile cues required, and needs further education ?  ?HOME EXERCISE PROGRAM: ?Access Code: [email or web address] ?  ? ?ASSESSMENT: ?CLINICAL IMPRESSION: ?Patient tolerated therapy well with no adverse effects.  Therapy focused on progression of mobility and core/hip strengthening. Patient required consistent cueing for proper technique and core engagement with mat based exercises to avoid excessive lumbar compensati

## 2021-08-02 ENCOUNTER — Ambulatory Visit: Payer: Medicare Other | Attending: Neurosurgery | Admitting: Physical Therapy

## 2021-08-02 ENCOUNTER — Other Ambulatory Visit: Payer: Self-pay

## 2021-08-02 ENCOUNTER — Encounter: Payer: Self-pay | Admitting: Physical Therapy

## 2021-08-02 DIAGNOSIS — R262 Difficulty in walking, not elsewhere classified: Secondary | ICD-10-CM | POA: Insufficient documentation

## 2021-08-02 DIAGNOSIS — M6281 Muscle weakness (generalized): Secondary | ICD-10-CM | POA: Insufficient documentation

## 2021-08-02 DIAGNOSIS — M546 Pain in thoracic spine: Secondary | ICD-10-CM | POA: Insufficient documentation

## 2021-08-02 DIAGNOSIS — M62830 Muscle spasm of back: Secondary | ICD-10-CM | POA: Insufficient documentation

## 2021-08-02 DIAGNOSIS — G8929 Other chronic pain: Secondary | ICD-10-CM | POA: Diagnosis not present

## 2021-08-02 DIAGNOSIS — M545 Low back pain, unspecified: Secondary | ICD-10-CM | POA: Insufficient documentation

## 2021-08-02 NOTE — Patient Instructions (Signed)
Access Code: [email or web address] ?URL: [email or web address] ?Date: 08/02/2021 ?Prepared by: Hilda Blades ? ?Exercises ?- Supine Pelvic Tilt  - 1 x daily - 2 sets - 10 reps - 5 seconds hold ?- Straight Leg Raise  - 1 x daily - 2 sets - 10 reps ?- Bridge  - 1 x daily - 3 sets - 5 reps ?- Supine Lower Trunk Rotation  - 1-2 x daily - 5 reps - 20 seconds hold ?- Supine Single Knee to Chest Stretch  - 1-2 x daily - 3 reps - 20 seconds hold ?- Supine Piriformis Stretch with Foot on Ground  - 1-2 x daily - 3 reps - 20 seconds hold ?- Sidelying Hip Abduction  - 1 x daily - 2 sets - 10 reps ?- Sit to Stand Without Arm Support  - 1 x daily - 3 sets - 10 reps ?

## 2021-08-09 DIAGNOSIS — L668 Other cicatricial alopecia: Secondary | ICD-10-CM | POA: Diagnosis not present

## 2021-08-10 ENCOUNTER — Encounter: Payer: Medicare Other | Admitting: Physical Therapy

## 2021-08-14 ENCOUNTER — Ambulatory Visit (INDEPENDENT_AMBULATORY_CARE_PROVIDER_SITE_OTHER): Payer: Medicare Other | Admitting: Family Medicine

## 2021-08-14 ENCOUNTER — Encounter: Payer: Self-pay | Admitting: Family Medicine

## 2021-08-14 VITALS — BP 134/70 | HR 64 | Ht 68.0 in | Wt 185.2 lb

## 2021-08-14 DIAGNOSIS — E119 Type 2 diabetes mellitus without complications: Secondary | ICD-10-CM | POA: Diagnosis not present

## 2021-08-14 LAB — GLUCOSE, POCT (MANUAL RESULT ENTRY): POC Glucose: 179 mg/dl — AB (ref 70–99)

## 2021-08-14 LAB — POCT GLYCOSYLATED HEMOGLOBIN (HGB A1C): HbA1c, POC (controlled diabetic range): 8.4 % — AB (ref 0.0–7.0)

## 2021-08-14 MED ORDER — SITAGLIPTIN PHOSPHATE 25 MG PO TABS: 25.0000 mg | ORAL_TABLET | Freq: Every day | ORAL | 0 refills | Status: DC

## 2021-08-14 NOTE — Progress Notes (Signed)
   SUBJECTIVE:   CHIEF COMPLAINT / HPI:   Chief Complaint  Patient presents with   Follow-up    a1c     Lori Jones is a 61 y.o. female here for follow up:    Diabetes Mellitus  Eats 3 meals a day and occasionally snacks. Has not been as active as she typically does because of back pain.  Denies missing any doses of DM medications.  Has paperwork from her home visit with the nurse that states that her A1c was 7.0 prior to her corticosteroid injections her back.  Home blood sugars range from 105-180.  She is eager to get her blood sugars down for her upcoming surgery.  PERTINENT  PMH / PSH: reviewed and updated as appropriate   OBJECTIVE:   BP 140/62   Pulse 64   Ht '5\' 8"'$  (1.727 m)   Wt 185 lb 4 oz (84 kg)   SpO2 99%   BMI 28.17 kg/m    GEN: well appearing female in no acute distress  CVS: well perfused  RESP: speaking in full sentences without pause, no respiratory distress     ASSESSMENT/PLAN:   Diabetes mellitus without complication (HCC) Uncontrolled.  A1c today 8.4 from 9.2.  Prior to her corticosteroid injections for her back her A1c was at goal with metformin.  She is motivated to bring her blood sugars down.  She does have an upcoming corticosteroid injection which may cause her blood sugars to go back up.  Patient went to check her glucometer today.  Her glucometer read 172 and the office check was 172.  Her glucometer is accurate.  Continue metformin 1000 mg twice daily.  Start Januvia 25 mg daily.  Follow-up as scheduled. Pt agrees with plan.      [name], DO PGY-3, [facility] 08/14/2021

## 2021-08-14 NOTE — Patient Instructions (Signed)
?  It was great seeing you today! ? ?Visit Remembers: ?- Stop by the pharmacy to pick up your prescriptions  ?- Continue to work on your healthy eating habits and incorporating exercise into your daily life. (see below) ?- Your goal is to have an A1c < 7.  Today it was 8.4!  ?- Medicine Changes: Start Sitagliptin 25 mg. Take this medication with food.  ? ? ?Diet Recommendations for Diabetes  ?Carbohydrate includes starch, sugar, and fiber.  Of these, only sugar and starch raise blood glucose.  (Fiber is found in fruits, vegetables [especially skin, seeds, and stalks] and whole grains.)   ?Starchy (carb) foods: Bread, rice, pasta, potatoes, corn, cereal, grits, crackers, bagels, muffins, all baked goods.  (Fruit, milk, and yogurt also have carbohydrate, but most of these foods will not spike your blood sugar as most starchy foods will.)  A few fruits do cause high blood sugars; use small portions of bananas (limit to 1/2 at a time), grapes, watermelon, oranges, and most tropical fruits.   ?Protein foods: Meat, fish, poultry, eggs, dairy foods, and beans such as pinto and kidney beans (beans also provide carbohydrate).  ? ?1. Eat at least REAL 3 meals and 1-2 snacks per day. Never go more than 4-5 hours while awake without eating. Eat breakfast within the first hour of getting up.   ?2. Limit starchy foods to TWO per meal and ONE per snack. ONE portion of a starchy food is equal to the following:  ? - ONE slice of bread (or its equivalent, such as half of a hamburger bun).  ? - 1/2 cup of a "scoopable" starchy food such as potatoes or rice.  ? - 15 grams of Total Carbohydrate as shown on food label.  ? - Every 4 ounces of a sweet drink (including fruit juice). ?3. Include at every meal: a protein food, a carb food, and vegetables and/or fruit.  ? - Obtain twice the volume of veg's as protein or carbohydrate foods for both lunch and dinner.  ? - Fresh or frozen veg's are best.  ? - Keep frozen veg's on hand for a quick  vegetable serving.    ?  ? ?Please bring all of your medications with you to each visit.  ? ?Feel free to call with any questions or concerns at any time, at [phone]. ?  ?Take care,  ?Dr. Susa Simmonds ?[place]  ?

## 2021-08-15 ENCOUNTER — Ambulatory Visit: Payer: Medicare Other

## 2021-08-15 ENCOUNTER — Telehealth: Payer: Medicare Other

## 2021-08-15 DIAGNOSIS — G8918 Other acute postprocedural pain: Secondary | ICD-10-CM | POA: Diagnosis not present

## 2021-08-15 DIAGNOSIS — M25532 Pain in left wrist: Secondary | ICD-10-CM | POA: Diagnosis not present

## 2021-08-15 DIAGNOSIS — M958 Other specified acquired deformities of musculoskeletal system: Secondary | ICD-10-CM | POA: Diagnosis not present

## 2021-08-15 DIAGNOSIS — G5602 Carpal tunnel syndrome, left upper limb: Secondary | ICD-10-CM | POA: Diagnosis not present

## 2021-08-16 NOTE — Assessment & Plan Note (Signed)
Uncontrolled.  A1c today 8.4 from 9.2.  Prior to her corticosteroid injections for her back her A1c was at goal with metformin.  She is motivated to bring her blood sugars down.  She does have an upcoming corticosteroid injection which may cause her blood sugars to go back up.  Patient went to check her glucometer today.  Her glucometer read 172 and the office check was 172.  Her glucometer is accurate.  Continue metformin 1000 mg twice daily.  Start Januvia 25 mg daily.  Follow-up as scheduled.

## 2021-08-20 ENCOUNTER — Ambulatory Visit: Payer: Medicare Other | Admitting: Physical Therapy

## 2021-08-20 DIAGNOSIS — G8929 Other chronic pain: Secondary | ICD-10-CM | POA: Diagnosis not present

## 2021-08-20 DIAGNOSIS — M6281 Muscle weakness (generalized): Secondary | ICD-10-CM | POA: Diagnosis not present

## 2021-08-20 DIAGNOSIS — R262 Difficulty in walking, not elsewhere classified: Secondary | ICD-10-CM

## 2021-08-20 DIAGNOSIS — M62830 Muscle spasm of back: Secondary | ICD-10-CM

## 2021-08-20 DIAGNOSIS — M545 Low back pain, unspecified: Secondary | ICD-10-CM | POA: Diagnosis not present

## 2021-08-20 DIAGNOSIS — M546 Pain in thoracic spine: Secondary | ICD-10-CM | POA: Diagnosis not present

## 2021-08-20 NOTE — Therapy (Signed)
OUTPATIENT PHYSICAL THERAPY TREATMENT NOTE   Patient Name: Lori Jones MRN: [id number] DOB:[birth year], 61 y.o., female Today's Date: 08/20/2021  PCP: [name], DO REFERRING PROVIDER: Earnie Larsson, MD  END OF SESSION:   PT End of Session - 08/20/21 1110     Visit Number 6    Number of Visits 9    Date for PT Re-Evaluation 08/27/21    Authorization Type UHC/MCR/MCD, 6th visit and 10th FOTO, 10th visit progress note    PT Start Time 1107    PT Stop Time 1138    PT Time Calculation (min) 31 min    Activity Tolerance Patient tolerated treatment well    Behavior During Therapy WFL for tasks assessed/performed                 Past Medical History:  Diagnosis Date   Allergy    Anemia    Anginal pain (HCC)    hx of CPs   Anxiety    Arthritis    Ascending aorta dilatation (HCC)    6mm by echo 08/2020   Asthma    hx of in childhood    Back pain, chronic    Bursitis    Depression    Diabetes mellitus    Foot fracture, right    Heart murmur    Herniated disc    Hypercholesteremia    Hypertension    no meds   Hypothyroid    Kidney disease    "pelvic kidney" bilat    Scoliosis    Seizures ([place])    hx of in childhood and again in college    Stroke [facility])    hx of sun stroke while in college    Past Surgical History:  Procedure Laterality Date   ABDOMINAL HYSTERECTOMY     CESAREAN SECTION     CHOLECYSTECTOMY N/A 09/08/2014   Procedure: LAPAROSCOPIC CHOLECYSTECTOMY;  Surgeon: Coralie Keens, MD;  Location: [facility];  Service: General;  Laterality: N/A;   HAND SURGERY     dog bite   INCISIONAL HERNIA REPAIR N/A 04/24/2016   Procedure: HERNIA REPAIR INCISIONAL;  Surgeon: Coralie Keens, MD;  Location: [place];  Service: General;  Laterality: N/A;   INSERTION OF MESH N/A 04/24/2016   Procedure: INSERTION OF MESH;  Surgeon: Coralie Keens, MD;  Location: [place];  Service: General;  Laterality: N/A;   Thumb surgery     VENTRAL HERNIA REPAIR N/A 09/08/2014    Procedure: VENTRAL HERNIA REPAIR;  Surgeon: Coralie Keens, MD;  Location: [facility];  Service: General;  Laterality: N/A;   Patient Active Problem List   Diagnosis Date Noted   Pelvic kidney 11/29/2020   Right hip pain 11/29/2020   Unsteadiness 09/27/2020   Ascending aorta dilatation ([place]) 09/22/2020   Idiopathic scoliosis 04/05/2020   Costochondritis 09/14/2019   Anxiety and depression 03/20/2018   Gastroesophageal reflux disease 03/20/2018   Atypical chest pain 10/04/2016   Hair loss 03/06/2016   Chronic back pain 02/08/2015   Left wrist pain 12/17/2013   HLD (hyperlipidemia) 10/06/2013   Hypothyroidism 08/06/2013   Schizoaffective disorder, unspecified type ([place]) 07/10/2013   Diabetes mellitus without complication ([place]) 21/01/5519   Heart murmur 07/10/2013    REFERRING DIAG: Radiculopathy, lumbar region  THERAPY DIAG:  Chronic low back pain without sciatica, unspecified back pain laterality  Difficulty in walking, not elsewhere classified  Muscle spasm of back  Pain in thoracic spine  Muscle weakness (generalized)  PERTINENT HISTORY: Allergy, anemia, Hx of Chest pain, anxiety,  OA, Asthma, Chronic back pain, depression , anginal pain, DM, heart murmur, foot (5th metatarsal fx, herniated disc, HTN, hypothryroiid, Scoliosis, seizures, Stroke,schizoaffective disorder  PRECAUTIONS: none   SUBJECTIVE:  Patient had surgery 08/06/21 on L UE.  Reports unavailable, states she had a pinched nerve, carpal tunnel. In a hard cast and sling.  Back has been OK since the arm has been hurting so much.    PAIN:  Are you having pain? Yes:  NPRS scale: 0/10 Pain location: Low back  Pain description: Tingling numb in bil inner thighs and back and  Aggravating factors: standing and walking too long. When I dont have back support.  Cant drive past 1 hour, washing dishes , household chores and making beds one sheet at a time,   Relieving factors: gabapentin  5/10 in L arm   OBJECTIVE:   PATIENT SURVEYS:  FOTO 37%  predicted 48%  FOTO 52% on discharge 08/20/21   MUSCLE LENGTH: Hamstrings: EFL Thomas test: Right 40 deg; Left 43 deg   POSTURE:  Pt leans to left in gait and standing.  Pelvic level elevated on R side. Levoscoliosis,    PALPATION: TTP over R >L QL and Lumbar paraspinals R >L pain   LUMBAR ROM:    Active  A/PROM  07/02/2021 AROM 08/20/21  Flexion Fingertips to knees bil Fingertips to distal shin   Extension 50% WFL no pain   Right lateral flexion Knee jt pain WNL pain on R   Left lateral flexion Knee jt no pain WNL pain on R   Right rotation 60% 25%  Left rotation 60% 25%   (Blank rows = not tested)   LE ROM:  Pt with tightness and pulling with flexion and R side lateral  and extension   Active  Right 07/02/2021 Left 07/02/2021  Hip flexion 100 100  Hip extension      Hip abduction      Hip adduction      Hip internal rotation 23 25  Hip external rotation 25 26  Knee flexion      Knee extension      Ankle dorsiflexion      Ankle plantarflexion      Ankle inversion      Ankle eversion       (Blank rows = not tested)   LE MMT:  grossly 4/5 in bil LE    5 X STS 19.55 sec   MMT Right 07/02/2021 Left 07/02/2021 Rt  08/20/21 L 08/20/21  Hip flexion 4 4 4+ 4+  Hip extension 4- 4-    Hip abduction 3- 4-    Hip adduction        Hip internal rotation        Hip external rotation        Knee flexion     5 5  Knee extension     5 5  Ankle dorsiflexion        Ankle plantarflexion        Ankle inversion        Ankle eversion         (Blank rows = not tested)   LUMBAR SPECIAL TESTS:  Straight leg raise test: Negative, Slump test: Negative, SI Compression/distraction test: Negative, and Thomas test: Positive   FUNCTIONAL TESTS:  5 times sit to stand: 19.55 08/20/21 12.3 sec  6 minute walk test: TBA   Pt bears wt to left with sit to stand Unable to squat more than 25 % range and  bear wt  to Left  GAIT: Distance walked: 150 Assistive  device utilized:  used no AD but did bring SPC Level of assistance: Modified independence Comments:  2.97 ft/sec gait velocity.  Pt with left trunk lean with antalgic gait       TODAY'S TREATMENT   [facility] Adult PT Treatment:                                                DATE: 08/20/21 Seated breathing, core activation  Self Care: HEP verbal review, discussion of POC and recent surgery.  Goals FOTO Trunk ROM (much improved) Body mechanics while standing to do dishes, ADLs     [facility] Adult PT Treatment:                                                DATE: 08/02/21 Therapeutic Exercise: NuStep L6 x 6 min with UE/LE while taking subjective LTR 10 x 5 sec each - limitation moving LE to left Piriformis stretch 2 x 20 sec each PPT 10 x 5 sec hold - cued for breathing SLR with focus on abdominal engagement x 10 each Bridge 2 x 5 - partial range, cues for gluteal engagement Sidelying hip abduction 2 x 10 each Sit to stand without UE assist 2 x 10 - cues for forward trunk lean and hip hinge   [facility] Adult PT Treatment:                                                DATE: 07/23/21 Therapeutic Exercise: NuStep L6 UE and LE for  6 min  Sit to stand no UE x 10  STS with 10 lbs KB  Squat x 10 with 10 lb KB Hip hinge dowel for hands then cue for back Hinge with 10 lbs multiple reps, cues mostly for neck and upper back Supine pelvic tilt x 8 cues  Bridge x 8 cues for breathing Knee to chest x 2  Lower trunk rotation x 10 cues  Therapeutic Activity: Lifting from floor, body mechanics Hip hinge and squat Leg length , scoliosis Golfers lift  [facility] Adult PT Treatment:                                                DATE: 07/19/2021 Therapeutic Exercise: Nustep level 6 x 5 mins while gathering subjective Seated rows RTB 2x10 Seated ER with scapular retraction 2x10 Supine PPT 5" hold 2x10 Supine hip adduction ball squeeze 5" hold 2x10 Supine SKTC x30" BIL Supine DKTC on red Pball 2x10 (at end,  began to have muscle cramp on L inner thigh) Bridges 2x10   PATIENT EDUCATION:  Education details: HEP update Person educated: Patient Education method: Explanation, Demonstration, Tactile cues, Verbal cues, and Handouts Education comprehension: verbalized understanding, returned demonstration, verbal cues required, tactile cues required, and needs further education   HOME EXERCISE PROGRAM: Access Code: [email or web address]    ASSESSMENT: CLINICAL IMPRESSION: Patient will be discharged  from PT at this time.  She was showing consistent improvement with respet to her mobility, back and function at home.  Since the surgery her focus has been off the back and now in her L UE. She may return if PT warranted for LUE and possibly low back if she is able.    OBJECTIVE IMPAIRMENTS Abnormal gait, decreased activity tolerance, decreased ROM, decreased strength, hypomobility, increased fascial restrictions, impaired perceived functional ability, impaired flexibility, impaired UE functional use, improper body mechanics, postural dysfunction, obesity, and pain.    ACTIVITY LIMITATIONS cleaning, driving, meal prep, laundry, and driving .    PERSONAL FACTORS Allergy, anemia, Hx of Chest pain, anxiety, OA, Asthma, Chronic back pain, depression , anginal pain, DM, heart murmur, foot (5th metatarsal fx, herniated disc, HTN, hypothryroiid, Scoliosis, seizures, Stroke,schizoaffective disorder are also affecting patient's functional outcome.      GOALS: Goals reviewed with patient? Yes   SHORT TERM GOALS: Target date: 07/30/2021   Pt will be independent with intial HEP Baseline: needs cues Goal status: partially met    2.  Demonstrate understanding of neutral posture and be more conscious of position and posture throughout the day.  Baseline: Pt with moderate scoliosis to left from L-2 R elevated pelvic level Goal status: needs cues, partially met    3.  Sit and stand with RT=LT wt bearing to reduce lumbar strain  and allow for increased tolerance for these positions for home tasks Baseline:  Pt bears wt to Lt with sit to stand Goal status: MET         LONG TERM GOALS: Target date: 08/27/2021   Pt will be independent with advanced HEP.  Baseline: No routine exercise Goal status: was met up to date of UE surgery    2.  Pt will be consistent about walking program for at least 1 mile 3-4 times a week Baseline: was walking at least 8, 000-10, 00 prior to recent surgery Goal status: met, now unable due to recent surgery   3.  Pain will be 2/10 or less when performing household chores for 30 minutes Baseline: Pt unable to perform household chores due to fear of injuring back and pain 6/10  Dishes is difficult  Goal status: was met, now cannot tell due to new surgery    4. Demonstrate 4+/5 RT LE strength to improve stability, safety and endurance of community level function Baseline: 4/5  Goal status: partially met, NT on DC    5.  FOTO will improve from 37 %   to  48%   indicating improved functional mobility.  Baseline: 53% Goal status: MET    6.  Pt will be able to sit and drive for 1 hour with lumbar roll and pain management strategies Baseline: unable to drive for more than 30 minutes without pain 5/10  Has a lift chair with heat and vibration, can drive now Goal status: MET      PLAN: PT FREQUENCY: 1x/week   PT DURATION: 8 weeks   PLANNED INTERVENTIONS: Therapeutic exercises, Therapeutic activity, Neuromuscular re-education, Balance training, Gait training, Patient/Family education, Joint mobilization, Stair training, DME instructions, Dry Needling, Electrical stimulation, Spinal mobilization, Cryotherapy, Moist heat, Taping, Ionotophoresis 4mg /ml Dexamethasone, and Manual therapy.   PLAN FOR NEXT SESSION:  NA, DC   Raeford Razor, PT 08/20/21 11:44 AM Phone: [phone] Fax: [phone]      PHYSICAL THERAPY DISCHARGE SUMMARY  Visits from Start of Care: 6  Current  functional level related to goals / functional outcomes:  See above    Remaining deficits: Pain, posture, functional mobility at home , L UE    Education / Equipment: HEP , body mechanics   Patient agrees to discharge. Patient goals were partially met. Patient is being discharged due to a change in medical status.  Raeford Razor, PT 08/20/21 11:53 AM Phone: [phone] Fax: [phone]

## 2021-08-31 DIAGNOSIS — G5602 Carpal tunnel syndrome, left upper limb: Secondary | ICD-10-CM | POA: Diagnosis not present

## 2021-09-04 ENCOUNTER — Encounter: Payer: Self-pay | Admitting: *Deleted

## 2021-09-06 DIAGNOSIS — Z1231 Encounter for screening mammogram for malignant neoplasm of breast: Secondary | ICD-10-CM | POA: Diagnosis not present

## 2021-09-06 DIAGNOSIS — M25532 Pain in left wrist: Secondary | ICD-10-CM | POA: Diagnosis not present

## 2021-09-10 DIAGNOSIS — M25532 Pain in left wrist: Secondary | ICD-10-CM | POA: Diagnosis not present

## 2021-09-12 ENCOUNTER — Ambulatory Visit (INDEPENDENT_AMBULATORY_CARE_PROVIDER_SITE_OTHER): Payer: Medicare Other | Admitting: Family Medicine

## 2021-09-12 VITALS — BP 133/82 | HR 98

## 2021-09-12 DIAGNOSIS — T678XXS Other effects of heat and light, sequela: Secondary | ICD-10-CM

## 2021-09-12 DIAGNOSIS — M25512 Pain in left shoulder: Secondary | ICD-10-CM

## 2021-09-12 NOTE — Progress Notes (Signed)
   SUBJECTIVE:   CHIEF COMPLAINT / HPI:   Lori Jones is a 61 y.o. female who I was asked to see by the RN team. Pt was outside for in the heat with her jacket zipped up with long pants on. She did not want bugs to get on her. She reached to take her jacket off felt left arm pain.  Notes she felt lightheaded like she was about to pass out.  She drove to the clinic.  Says she had a "heat stroke" in the past. Denies vomiting, nausea, chest pain/discomfort and shortness of breath. No confusion, memory difficulty, jaw, intrascapular or neck pain.    PERTINENT  PMH / PSH: reviewed and updated as appropriate   OBJECTIVE:   BP 133/82   Pulse 98   SpO2 98%    GEN:  female, in no acute distress  CV: regular rate and rhythm RESP: no increased work of breathing, clear to ascultation bilaterally MSK: left shoulder: decreased ROM, strength 5/5 in bilateral upper extremities, lateral trapezius tender to palpation  SKIN: clammy and warm NEURO: alert, oriented x4, normal speech, no facial droop, moves all extremities appropriately, CN 2-12 grossly intact, normal sensation     ASSESSMENT/PLAN:    Heat stress related illness  Pt outside for ~1hr in June summer heat with long pants and shoulders in the yard. Pt placed in cool room with fan. Provided cool drink. VSS. Neuro exam unremarkable. After about 15-20 minutes patient much improved.  Left shoulder pain likely MSK related.  Doubt acute CVA. ED precautions provided.   [name], DO PGY-3, [facility] 09/14/2021

## 2021-09-12 NOTE — Progress Notes (Signed)
Patient presents to office regarding left sided shoulder pain. Patient states that shooting pain began in left shoulder approx. 1 hour ago (around 1530). States that she has been experiencing weakness since onset.   Denies headache, blurry vision or facial drooping. Patient is able to speak in complete sentences. Denies chest pain or SHOB. Patient was able to ambulate with no difficulties.   Vital signs obtained. See below. BP: 133/82 HR: 98 SpO2: 98%  Spoke with Dr. Susa Simmonds regarding patient. Dr. Susa Simmonds went to evaluate patient due to above symptoms.   See eval note from Dr. Susa Simmonds. No further nursing interventions required.  Patient was walked out of office after provider evaluation.   Talbot Grumbling, RN

## 2021-09-14 ENCOUNTER — Encounter: Payer: Self-pay | Admitting: Family Medicine

## 2021-09-14 DIAGNOSIS — M25532 Pain in left wrist: Secondary | ICD-10-CM | POA: Diagnosis not present

## 2021-09-17 ENCOUNTER — Encounter: Payer: Self-pay | Admitting: Family Medicine

## 2021-09-17 ENCOUNTER — Ambulatory Visit (INDEPENDENT_AMBULATORY_CARE_PROVIDER_SITE_OTHER): Payer: Medicare Other | Admitting: Family Medicine

## 2021-09-17 VITALS — BP 117/80 | HR 83 | Ht 68.5 in | Wt 190.4 lb

## 2021-09-17 DIAGNOSIS — E089 Diabetes mellitus due to underlying condition without complications: Secondary | ICD-10-CM | POA: Diagnosis not present

## 2021-09-17 DIAGNOSIS — E785 Hyperlipidemia, unspecified: Secondary | ICD-10-CM

## 2021-09-17 DIAGNOSIS — E119 Type 2 diabetes mellitus without complications: Secondary | ICD-10-CM

## 2021-09-17 DIAGNOSIS — M25532 Pain in left wrist: Secondary | ICD-10-CM | POA: Diagnosis not present

## 2021-09-17 DIAGNOSIS — E039 Hypothyroidism, unspecified: Secondary | ICD-10-CM

## 2021-09-17 LAB — POCT GLYCOSYLATED HEMOGLOBIN (HGB A1C): HbA1c, POC (controlled diabetic range): 8.1 % — AB (ref 0.0–7.0)

## 2021-09-17 MED ORDER — LEVOTHYROXINE SODIUM 25 MCG PO TABS: ORAL_TABLET | ORAL | 2 refills | Status: DC

## 2021-09-17 MED ORDER — ATORVASTATIN CALCIUM 40 MG PO TABS: ORAL_TABLET | ORAL | 3 refills | Status: DC

## 2021-09-17 MED ORDER — METFORMIN HCL 1000 MG PO TABS: 1000.0000 mg | ORAL_TABLET | Freq: Two times a day (BID) | ORAL | 3 refills | Status: DC

## 2021-09-17 NOTE — Assessment & Plan Note (Signed)
Recent LDL at goal.  Continue atorvastatin 40 mg daily.

## 2021-09-17 NOTE — Assessment & Plan Note (Signed)
Stable.  Recent TSH/T4 reviewed.  Refilled levothyroxine 25 mcg daily.

## 2021-09-17 NOTE — Progress Notes (Signed)
   SUBJECTIVE:   CHIEF COMPLAINT / HPI:   Chief Complaint  Patient presents with   Diabetes     Lori Jones is a 61 y.o. female here for diabetes follow up:   Diabetes Mellitus  She is worried that her diabetes is uncontrolled.  She is due to get more steroid injection soon back.  Has been exercising and trying to watch what she eats.  Is taking her medications and has not missed any doses.     PERTINENT  PMH / PSH: reviewed and updated as appropriate   OBJECTIVE:   BP 117/80   Pulse 83   Ht 5' 8.5" (1.74 m)   Wt 190 lb 6.4 oz (86.4 kg)   SpO2 98%   BMI 28.53 kg/m    GEN: pleasant female, in no acute distress  CV: regular rate and rhythm RESP: no increased work of breathing, clear to ascultation bilaterally MSK: Left wrist tear and posterior surgical incisions healing well, decreased range of motion in flexion and extension as well as abduction and abduction, wearing her left wrist brace status post surgery  SKIN: warm, dry, see MSK above    ASSESSMENT/PLAN:   Diabetes mellitus without complication ([place]) Uncontrolled.  A1c today 8.1 from 8.4.  She is continuing to get corticosteroid injections.  Continue metformin at 1000 mg twice daily with Januvia 25 mg.  Would like to increase this today but she declined.  Recheck A1c in 3 months.  Hypothyroidism Stable.  Recent TSH/T4 reviewed.  Refilled levothyroxine 25 mcg daily.  HLD (hyperlipidemia) Recent LDL at goal.  Continue atorvastatin 40 mg daily.     [name], DO PGY-3, [facility] 09/17/2021

## 2021-09-17 NOTE — Patient Instructions (Addendum)
  It was great seeing you today!  Please check-out at the front desk before leaving the clinic. I'd like to see you back in 3 months but if you need to be seen earlier than that for any new issues we're happy to fit you in, just give [place] a call!  Visit Remembers: - Stop by the pharmacy to pick up your prescriptions  - Continue to work on your healthy eating habits and incorporating exercise into your daily life. (see below) - Your goal is to have an A1c < 7; today it was 8.1  - Medicine Changes: Consider increasing your sitagliptin at your next visit    Diet Recommendations for Diabetes  Carbohydrate includes starch, sugar, and fiber.  Of these, only sugar and starch raise blood glucose.  (Fiber is found in fruits, vegetables [especially skin, seeds, and stalks] and whole grains.)   Starchy (carb) foods: Bread, rice, pasta, potatoes, corn, cereal, grits, crackers, bagels, muffins, all baked goods.  (Fruit, milk, and yogurt also have carbohydrate, but most of these foods will not spike your blood sugar as most starchy foods will.)  A few fruits do cause high blood sugars; use small portions of bananas (limit to 1/2 at a time), grapes, watermelon, oranges, and most tropical fruits.   Protein foods: Meat, fish, poultry, eggs, dairy foods, and beans such as pinto and kidney beans (beans also provide carbohydrate).   1. Eat at least REAL 3 meals and 1-2 snacks per day. Never go more than 4-5 hours while awake without eating. Eat breakfast within the first hour of getting up.   2. Limit starchy foods to TWO per meal and ONE per snack. ONE portion of a starchy food is equal to the following:   - ONE slice of bread (or its equivalent, such as half of a hamburger bun).   - 1/2 cup of a "scoopable" starchy food such as potatoes or rice.   - 15 grams of Total Carbohydrate as shown on food label.   - Every 4 ounces of a sweet drink (including fruit juice). 3. Include at every meal: a protein food, a carb  food, and vegetables and/or fruit.   - Obtain twice the volume of veg's as protein or carbohydrate foods for both lunch and dinner.   - Fresh or frozen veg's are best.   - Keep frozen veg's on hand for a quick vegetable serving.       Regarding lab work today:  Due to recent changes in healthcare laws, you may see the results of your imaging and laboratory studies on MyChart before your doctor has had a chance to review them.  We understand that in some cases there may be results that are confusing or concerning to you. Not all laboratory results come back in the same time frame and your doctor may be waiting for multiple results in order to interpret others.  Please give [place] 72 hours in order for your doctor to thoroughly review all the results before contacting the office for clarification of your results. If everything is normal, you will get a letter in the mail or a message in My Chart. Please give [place] a call if you do not hear from [place] after 2 weeks.  Please bring all of your medications with you to each visit.   Feel free to call with any questions or concerns at any time, at [phone].   Take care,  Dr. Rushie [facility]

## 2021-09-17 NOTE — Assessment & Plan Note (Signed)
Uncontrolled.  A1c today 8.1 from 8.4.  She is continuing to get corticosteroid injections.  Continue metformin at 1000 mg twice daily with Januvia 25 mg.  Would like to increase this today but she declined.  Recheck A1c in 3 months.

## 2021-09-20 DIAGNOSIS — M25532 Pain in left wrist: Secondary | ICD-10-CM | POA: Diagnosis not present

## 2021-09-24 DIAGNOSIS — M25532 Pain in left wrist: Secondary | ICD-10-CM | POA: Diagnosis not present

## 2021-09-26 ENCOUNTER — Other Ambulatory Visit: Payer: Self-pay | Admitting: Family Medicine

## 2021-09-26 DIAGNOSIS — M25532 Pain in left wrist: Secondary | ICD-10-CM

## 2021-09-27 ENCOUNTER — Other Ambulatory Visit: Payer: Self-pay | Admitting: Family Medicine

## 2021-09-27 DIAGNOSIS — M25532 Pain in left wrist: Secondary | ICD-10-CM | POA: Diagnosis not present

## 2021-10-01 DIAGNOSIS — M25532 Pain in left wrist: Secondary | ICD-10-CM | POA: Diagnosis not present

## 2021-10-03 DIAGNOSIS — M25532 Pain in left wrist: Secondary | ICD-10-CM | POA: Diagnosis not present

## 2021-10-08 DIAGNOSIS — M25532 Pain in left wrist: Secondary | ICD-10-CM | POA: Diagnosis not present

## 2021-10-09 DIAGNOSIS — H401112 Primary open-angle glaucoma, right eye, moderate stage: Secondary | ICD-10-CM | POA: Diagnosis not present

## 2021-10-10 DIAGNOSIS — M25532 Pain in left wrist: Secondary | ICD-10-CM | POA: Diagnosis not present

## 2021-10-17 DIAGNOSIS — M25532 Pain in left wrist: Secondary | ICD-10-CM | POA: Diagnosis not present

## 2021-10-19 DIAGNOSIS — M25532 Pain in left wrist: Secondary | ICD-10-CM | POA: Diagnosis not present

## 2021-10-24 DIAGNOSIS — M25532 Pain in left wrist: Secondary | ICD-10-CM | POA: Diagnosis not present

## 2021-10-26 DIAGNOSIS — M25532 Pain in left wrist: Secondary | ICD-10-CM | POA: Diagnosis not present

## 2021-10-29 ENCOUNTER — Other Ambulatory Visit: Payer: Self-pay | Admitting: Family Medicine

## 2021-10-31 DIAGNOSIS — M25532 Pain in left wrist: Secondary | ICD-10-CM | POA: Diagnosis not present

## 2021-11-02 DIAGNOSIS — M25532 Pain in left wrist: Secondary | ICD-10-CM | POA: Diagnosis not present

## 2021-11-08 ENCOUNTER — Ambulatory Visit: Payer: Self-pay

## 2021-11-08 NOTE — Patient Outreach (Signed)
  Care Coordination   Initial Visit Note   11/08/2021 Name: Lori Jones MRN: [id number] DOB: 1960/09/12  Lori Jones is a 61 y.o. year old female who sees Lowry Ram, MD for primary care. I spoke with  [name] by phone today  What matters to the patients health and wellness today? Discussed patients recent hand surgery and her progress with her pain management.  Patient is due for Annual Wellness Visit.   Goals Addressed   None     SDOH assessments and interventions completed:  Yes     Care Coordination Interventions Activated:  Yes-Notified [facility] admin patient is in need of AWV.  Care Coordination Interventions:  Yes, provided   Follow up plan: No further intervention required.   Encounter Outcome:  Pt. Visit Completed

## 2021-11-09 DIAGNOSIS — M25532 Pain in left wrist: Secondary | ICD-10-CM | POA: Diagnosis not present

## 2021-11-12 DIAGNOSIS — M25532 Pain in left wrist: Secondary | ICD-10-CM | POA: Diagnosis not present

## 2021-11-13 ENCOUNTER — Ambulatory Visit (INDEPENDENT_AMBULATORY_CARE_PROVIDER_SITE_OTHER): Payer: Medicare Other

## 2021-11-13 VITALS — Ht 68.0 in | Wt 189.0 lb

## 2021-11-13 DIAGNOSIS — Z0000 Encounter for general adult medical examination without abnormal findings: Secondary | ICD-10-CM | POA: Diagnosis not present

## 2021-11-13 NOTE — Progress Notes (Unsigned)
Subjective:   Lori Jones is a 61 y.o. female who presents for Medicare Annual (Subsequent) preventive examination.  Patient consented to have virtual visit and was identified by name and date of birth. Method of visit: {TELEPHONE VS OFBPZ:02585}  Encounter participants: Patient: Lori Jones - located at *** Nurse/Provider: Dorna Bloom - located at *** Others (if applicable): ***    Review of Systems:  ***       Objective:     Vitals: Ht _0  (1.727 m)   Wt 189 lb (85.7 kg)   BMI 28.74 kg/m   Body mass index is 28.74 kg/m.     11/13/2021    2:13 PM 09/17/2021   12:15 PM 08/14/2021    9:08 AM 07/12/2021    2:44 PM 07/02/2021   11:02 AM 05/24/2021   10:05 AM 05/04/2021   11:00 AM  Advanced Directives  Does Patient Have a Medical Advance Directive? _1  No No  Would patient like information on creating a medical advance directive? No - Patient declined No - Patient declined No - Patient declined No - Patient declined No - Patient declined No - Patient declined     Tobacco Social History   Tobacco Use  Smoking Status Never   Passive exposure: Never  Smokeless Tobacco Never     Counseling given: Not Answered   Clinical Intake:  Pre-visit preparation completed: Yes           How often do you need to have someone help you when you read instructions, pamphlets, or other written materials from your doctor or pharmacy?: 1 - Never What is the last grade level you completed in school?: College-Bachelors SW        Past Medical History:  Diagnosis Date   Allergy    Anemia    Anginal pain ([place])    hx of CPs   Anxiety    Arthritis    Ascending aorta dilatation ([place])    37m by echo 08/2020   Asthma    hx of in childhood    Back pain, chronic    Bursitis    Depression    Diabetes mellitus    Foot fracture, right    Heart murmur    Herniated disc    Hypercholesteremia    Hypertension    no meds   Hypothyroid    Kidney disease     "pelvic kidney" bilat    Scoliosis    Seizures ([facility]    hx of in childhood and again in college    Stroke ([facility]    hx of sun stroke while in college    Past Surgical History:  Procedure Laterality Date   ABDOMINAL HYSTERECTOMY     CESAREAN SECTION     CHOLECYSTECTOMY N/A 09/08/2014   Procedure: LAPAROSCOPIC CHOLECYSTECTOMY;  Surgeon: DCoralie Keens MD;  Location: [facility];  Service: General;  Laterality: N/A;   HAND SURGERY     dog bite   INCISIONAL HERNIA REPAIR N/A 04/24/2016   Procedure: HERNIA REPAIR INCISIONAL;  Surgeon: DCoralie Keens MD;  [facility]  Service: General;  Laterality: N/A;   INSERTION OF MESH N/A 04/24/2016   Procedure: INSERTION OF MESH;  Surgeon: DCoralie Keens MD;  [facility]  Service: General;  Laterality: N/A;   Thumb surgery     VENTRAL HERNIA REPAIR N/A 09/08/2014   Procedure: VENTRAL HERNIA REPAIR;  Surgeon: DCoralie Keens MD;  Location: [facility];  Service: General;  Laterality: N/A;   Family History  Problem Relation Age of Onset   Cancer Mother    Depression Mother    Diabetes Mother    Heart disease Mother 51       Stents   Hypertension Mother    Hyperlipidemia Mother    Thyroid disease Mother    Cancer Father    Sickle cell anemia Brother    Diabetes Sister    Social History   Socioeconomic History   Marital status: Single    Spouse name: Not on file   Number of children: 1   Years of education: 16   Highest education level: Bachelor's degree (e.g., BA, AB, BS)  Occupational History   Occupation: Retired    Comment: Social Work  Tobacco Use   Smoking status: Never    Passive exposure: Never   Smokeless tobacco: Never  Vaping Use   Vaping Use: Never used  Substance and Sexual Activity   Alcohol use: No    Alcohol/week: 0.0 standard drinks of alcohol   Drug use: No   Sexual activity: Not Currently  Other Topics Concern   Not on file  Social History Narrative   Patient lives alone in [place].    Patient  has bachelors in social work.    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 Does not work full time.   Patient has one daughter who lives in [place], no grandchildren.    Patient enjoys reading, watching westerns, and fellowship.   Patient walks for exercise 4x per week.    Social Determinants of Health   Financial Resource Strain: Low Risk  (11/13/2021)   Overall Financial Resource Strain (CARDIA)    Difficulty of Paying Living Expenses: Not hard at all  Food Insecurity: No Food Insecurity (11/13/2021)   Hunger Vital Sign    Worried About Running Out of Food in the Last Year: Never true    Ran Out of Food in the Last Year: Never true  Transportation Needs: No Transportation Needs (11/13/2021)   PRAPARE - Hydrologist (Medical): No    Lack of Transportation (Non-Medical): No  Physical Activity: Sufficiently Active (11/13/2021)   Exercise Vital Sign    Days of Exercise per Week: 4 days    Minutes of Exercise per Session: 40 min  Stress: No Stress Concern Present (11/13/2021)   [place]    Feeling of Stress : Only a little  Social Connections: Moderately Integrated (11/13/2021)   Social Connection and Isolation Panel [NHANES]    Frequency of Communication with Friends and Family: More than three times a week    Frequency of Social Gatherings with Friends and Family: Three times a week    Attends Religious Services: More than 4 times per year    Active Member of Clubs or Organizations: Yes    Attends Archivist Meetings: More than 4 times per year    Marital Status: Never married    Outpatient Encounter Medications as of 11/13/2021  Medication Sig   ACCU-CHEK GUIDE test strip USE UP TO FOUR TIMES DAILY AS DIRECTED   Accu-Chek Softclix Lancets lancets USE UP TO FOUR TIMES DAILY AS DIRECTED   aspirin EC 81 MG tablet Take 1 tablet (81 mg total) by mouth daily.   atorvastatin (LIPITOR) 40 MG tablet  TAKE 1 TABLET(40 MG) BY MOUTH DAILY   baclofen (LIORESAL) 10 MG tablet Take 1 tablet (10 mg total) by mouth 3 (  three) times daily.   blood glucose meter kit and supplies KIT Dispense based on patient and insurance preference. Use up to four times daily as directed.   escitalopram (LEXAPRO) 10 MG tablet TAKE 1 TABLET(10 MG) BY MOUTH DAILY   gabapentin (NEURONTIN) 100 MG capsule TAKE 1 CAPSULE(100 MG) BY MOUTH THREE TIMES DAILY   JANUVIA 25 MG tablet TAKE 1 TABLET(25 MG) BY MOUTH DAILY AFTER BREAKFAST   levothyroxine (SYNTHROID) 25 MCG tablet Take daily 30 minutes before breakfast   metFORMIN (GLUCOPHAGE) 1000 MG tablet Take 1 tablet (1,000 mg total) by mouth 2 (two) times daily with a meal.   naproxen (NAPROSYN) 500 MG tablet TAKE 1 TABLET(500 MG) BY MOUTH TWICE DAILY WITH A MEAL   No facility-administered encounter medications on file as of 11/13/2021.    Activities of Daily Living    11/13/2021    2:14 PM  In your present state of health, do you have any difficulty performing the following activities:  Hearing? 0  Vision? 0  Difficulty concentrating or making decisions? 0  Walking or climbing stairs? 0  Dressing or bathing? 0  Doing errands, shopping? 0  Preparing Food and eating ? N  Using the Toilet? N  In the past six months, have you accidently leaked urine? N  Do you have problems with loss of bowel control? N  Managing your Medications? N  Managing your Finances? N  Housekeeping or managing your Housekeeping? N    Patient Care Team: Lowry Ram, MD as PCP - General (Family Medicine) Sueanne Margarita, MD as PCP - Cardiology (Cardiology) Kennith Center, RD as Dietitian (Family Medicine) Mariana Kaufman, RN as Case Manager Clent Jacks, MD as Consulting Physician (Ophthalmology) Juanita Craver, MD as Consulting Physician (Gastroenterology) Iran Planas, MD as Consulting Physician (Orthopedic Surgery) Coralie Keens, MD as Consulting Physician (General Surgery) Linda Hedges, DO as Consulting Physician (Obstetrics and Gynecology) Lazaro Arms, RN as Case Manager    Assessment:   This is a routine wellness examination for Evalette.  Exercise Activities and Dietary recommendations Current Exercise Habits: Home exercise routine, Type of exercise: walking, Time (Minutes): 40, Frequency (Times/Week): 4, Weekly Exercise (Minutes/Week): 160, Exercise limited by: cardiac condition(s);orthopedic condition(s)   Goals        Acknowledge receipt of Advanced Directive package      Packet given today and counseled on completing and return.       Eat more fruits and vegetables (pt-stated)      Exercise 150 minutes per week (moderate activity) (pt-stated)      Weight (lb) < 165 lb (74.8 kg) (pt-stated)      6% weight loss        Fall Risk    11/13/2021    2:13 PM 06/04/2021    9:51 AM 03/07/2021   10:27 AM 09/13/2020    9:21 AM 06/01/2019    2:23 PM  Fall Risk   Falls in the past year? 0 0 1 1 0  Number falls in past yr: 0  0 0 0  Injury with Fall? 0  1 1 0  Risk for fall due to : Impaired mobility   History of fall(s)   Follow up Falls prevention discussed   Falls prevention discussed    Is the patient's home free of loose throw rugs in walkways, pet beds, electrical cords, etc?   {Blank single:19197::"yes","no"}      Grab bars in the bathroom? {Blank single:19197::"yes","no"}      Handrails on  the stairs?   {Blank single:19197::"yes","no"}      Adequate lighting?   {Blank single:19197::"yes","no"}  Patient rating of health (0-10) scale: 7   Depression Screen    11/13/2021    2:13 PM 11/13/2021    2:09 PM 08/14/2021    9:09 AM 07/12/2021    2:45 PM  PHQ 2/9 Scores  PHQ - 2 Score 0 0 0 0  PHQ- 9 Score   0 0     Cognitive Function    03/17/2018    9:36 AM  MMSE - Mini Mental State Exam  Orientation to time 5  Orientation to Place 5  Registration 3  Attention/ Calculation 5  Recall 3  Language- name 2 objects 2  Language- repeat 1  Language-  follow 3 step command 3  Language- read & follow direction 1  Write a sentence 1  Copy design 1  Total score 30        11/13/2021    2:15 PM 09/13/2020    9:23 AM 03/17/2018    9:36 AM  6CIT Screen  What Year? 0 points 0 points 0 points  What month? 0 points 0 points 0 points  What time? 0 points 0 points 0 points  Count back from 20 0 points 0 points 0 points  Months in reverse 0 points 0 points 0 points  Repeat phrase 0 points 0 points 0 points  Total Score 0 points 0 points 0 points    Immunization History  Administered Date(s) Administered   PFIZER Comirnaty(Gray Top)Covid-19 Tri-Sucrose Vaccine 09/13/2020   PFIZER(Purple Top)SARS-COV-2 Vaccination 06/24/2019, 07/19/2019, 02/14/2020   Pfizer Covid-19 Vaccine Bivalent Booster 46yr & up 04/30/2021   Tdap 06/22/2015     Screening Tests Health Maintenance  Topic Date Due   Zoster Vaccines- Shingrix (1 of 2) Never done   URINE MICROALBUMIN  09/01/2021   INFLUENZA VACCINE  10/30/2021   OPHTHALMOLOGY EXAM  01/31/2022   FOOT EXAM  02/08/2022   HEMOGLOBIN A1C  03/19/2022   COLONOSCOPY (Pts 45-463yrInsurance coverage will need to be confirmed)  12/18/2022   MAMMOGRAM  09/15/2023   PAP SMEAR-Modifier  02/19/2024   TETANUS/TDAP  06/21/2025   COVID-19 Vaccine  Completed   Hepatitis C Screening  Completed   HIV Screening  Completed   HPV VACCINES  Aged Out    Cancer Screenings: Lung: Low Dose CT Chest recommended if Age 62-80ears, 20 pack-year currently smoking OR have quit w/in 15years. Patient {DOES NOT does:27190::"does not"} qualify. Breast:  Up to date on Mammogram? {Yes/No:30480221}   Up to date of Bone Density/Dexa? {Yes/No:30480221} Colorectal: ***  Additional Screenings: ***: Hepatitis C Screening:      Plan:   ***   I have personally reviewed and noted the following in the patient's chart:   Medical and social history Use of alcohol, tobacco or illicit drugs  Current medications and  supplements Functional ability and status Nutritional status Physical activity Advanced directives List of other physicians Hospitalizations, surgeries, and ER visits in previous 12 months Vitals Screenings to include cognitive, depression, and falls Referrals and appointments  In addition, I have reviewed and discussed with patient certain preventive protocols, quality metrics, and best practice recommendations. A written personalized care plan for preventive services as well as general preventive health recommendations were provided to patient.    This visit was conducted virtually in the setting of the [place].    EmDorna BloomCMAurora8/15/2023

## 2021-11-14 DIAGNOSIS — M5416 Radiculopathy, lumbar region: Secondary | ICD-10-CM | POA: Diagnosis not present

## 2021-11-14 DIAGNOSIS — M25532 Pain in left wrist: Secondary | ICD-10-CM | POA: Diagnosis not present

## 2021-11-14 NOTE — Progress Notes (Signed)
I have reviewed this visit and agree with the documentation.   

## 2021-11-14 NOTE — Patient Instructions (Signed)
Lori Jones Thank you for taking time to come for your Medicare Wellness Visit. I appreciate your ongoing commitment to your health goals. Please review the following plan we discussed and let me know if I can assist you in the future.    These are the goals we discussed:   Goals        Acknowledge receipt of Financial controller given today and counseled on completing and return.       Eat more fruits and vegetables (pt-stated)      HEMOGLOBIN A1C < 8      Last A1c-8.1 on 09/17/2021.       Weight (lb) < 165 lb (74.8 kg) (pt-stated)      6% weight loss       We also discussed recommended health maintenance. As discussed, you are due for: Health Maintenance  Topic Date Due   Zoster Vaccines- Shingrix (1 of 2) Never done   URINE MICROALBUMIN  09/01/2021   INFLUENZA VACCINE  10/30/2021   OPHTHALMOLOGY EXAM  01/31/2022   FOOT EXAM  02/08/2022   HEMOGLOBIN A1C  03/19/2022   COLONOSCOPY (Pts 45-42yrs Insurance coverage will need to be confirmed)  12/18/2022   MAMMOGRAM  09/15/2023   PAP SMEAR-Modifier  02/19/2024   TETANUS/TDAP  06/21/2025   COVID-19 Vaccine  Completed   Hepatitis C Screening  Completed   HIV Screening  Completed   HPV VACCINES  Aged Out   PCP apt 12/24/2021. Consider Shingles Vaccine. Keep up the great work walking!  Preventive Care 57-71 Years Old, Female Preventive care refers to lifestyle choices and visits with your health care provider that can promote health and wellness. Preventive care visits are also called wellness exams. What can I expect for my preventive care visit? Counseling Your health care provider may ask you questions about your: Medical history, including: Past medical problems. Family medical history. Pregnancy history. Current health, including: Menstrual cycle. Method of birth control. Emotional well-being. Home life and relationship well-being. Sexual activity and sexual health. Lifestyle, including: Alcohol,  nicotine or tobacco, and drug use. Access to firearms. Diet, exercise, and sleep habits. Work and work Statistician. Sunscreen use. Safety issues such as seatbelt and bike helmet use. Physical exam Your health care provider will check your: Height and weight. These may be used to calculate your BMI (body mass index). BMI is a measurement that tells if you are at a healthy weight. Waist circumference. This measures the distance around your waistline. This measurement also tells if you are at a healthy weight and may help predict your risk of certain diseases, such as type 2 diabetes and high blood pressure. Heart rate and blood pressure. Body temperature. Skin for abnormal spots. What immunizations do I need?  Vaccines are usually given at various ages, according to a schedule. Your health care provider will recommend vaccines for you based on your age, medical history, and lifestyle or other factors, such as travel or where you work. What tests do I need? Screening Your health care provider may recommend screening tests for certain conditions. This may include: Lipid and cholesterol levels. Diabetes screening. This is done by checking your blood sugar (glucose) after you have not eaten for a while (fasting). Pelvic exam and Pap test. Hepatitis B test. Hepatitis C test. HIV (human immunodeficiency virus) test. STI (sexually transmitted infection) testing, if you are at risk. Lung cancer screening. Colorectal cancer screening. Mammogram. Talk with your health care provider about when you  should start having regular mammograms. This may depend on whether you have a family history of breast cancer. BRCA-related cancer screening. This may be done if you have a family history of breast, ovarian, tubal, or peritoneal cancers. Bone density scan. This is done to screen for osteoporosis. Talk with your health care provider about your test results, treatment options, and if necessary, the need for  more tests. Follow these instructions at home: Eating and drinking  Eat a diet that includes fresh fruits and vegetables, whole grains, lean protein, and low-fat dairy products. Take vitamin and mineral supplements as recommended by your health care provider. Do not drink alcohol if: Your health care provider tells you not to drink. You are pregnant, may be pregnant, or are planning to become pregnant. If you drink alcohol: Limit how much you have to 0-1 drink a day. Know how much alcohol is in your drink. In the [place] one drink equals one 12 oz bottle of beer (355 mL), one 5 oz glass of wine (148 mL), or one 1 oz glass of hard liquor (44 mL). Lifestyle Brush your teeth every morning and night with fluoride toothpaste. Floss one time each day. Exercise for at least 30 minutes 5 or more days each week. Do not use any products that contain nicotine or tobacco. These products include cigarettes, chewing tobacco, and vaping devices, such as e-cigarettes. If you need help quitting, ask your health care provider. Do not use drugs. If you are sexually active, practice safe sex. Use a condom or other form of protection to prevent STIs. If you do not wish to become pregnant, use a form of birth control. If you plan to become pregnant, see your health care provider for a prepregnancy visit. Take aspirin only as told by your health care provider. Make sure that you understand how much to take and what form to take. Work with your health care provider to find out whether it is safe and beneficial for you to take aspirin daily. Find healthy ways to manage stress, such as: Meditation, yoga, or listening to music. Journaling. Talking to a trusted person. Spending time with friends and family. Minimize exposure to UV radiation to reduce your risk of skin cancer. Safety Always wear your seat belt while driving or riding in a vehicle. Do not drive: If you have been drinking alcohol. Do not ride with  someone who has been drinking. When you are tired or distracted. While texting. If you have been using any mind-altering substances or drugs. Wear a helmet and other protective equipment during sports activities. If you have firearms in your house, make sure you follow all gun safety procedures. Seek help if you have been physically or sexually abused. What's next? Visit your health care provider once a year for an annual wellness visit. Ask your health care provider how often you should have your eyes and teeth checked. Stay up to date on all vaccines. This information is not intended to replace advice given to you by your health care provider. Make sure you discuss any questions you have with your health care provider. Document Revised: 09/13/2020 Document Reviewed: 09/13/2020 Elsevier Patient Education  [facility]'s number is [phone]. Please call with questions or concerns about what we discussed today.

## 2021-11-19 DIAGNOSIS — M25532 Pain in left wrist: Secondary | ICD-10-CM | POA: Diagnosis not present

## 2021-11-21 DIAGNOSIS — M25532 Pain in left wrist: Secondary | ICD-10-CM | POA: Diagnosis not present

## 2021-11-28 DIAGNOSIS — M25532 Pain in left wrist: Secondary | ICD-10-CM | POA: Diagnosis not present

## 2021-11-29 ENCOUNTER — Other Ambulatory Visit: Payer: Self-pay | Admitting: Family Medicine

## 2021-11-30 DIAGNOSIS — M25532 Pain in left wrist: Secondary | ICD-10-CM | POA: Diagnosis not present

## 2021-12-04 DIAGNOSIS — G5602 Carpal tunnel syndrome, left upper limb: Secondary | ICD-10-CM | POA: Diagnosis not present

## 2021-12-04 DIAGNOSIS — M25532 Pain in left wrist: Secondary | ICD-10-CM | POA: Diagnosis not present

## 2021-12-14 ENCOUNTER — Ambulatory Visit (INDEPENDENT_AMBULATORY_CARE_PROVIDER_SITE_OTHER): Payer: Medicare Other | Admitting: Family Medicine

## 2021-12-14 VITALS — BP 141/79 | HR 78 | Ht 68.0 in | Wt 192.0 lb

## 2021-12-14 DIAGNOSIS — R42 Dizziness and giddiness: Secondary | ICD-10-CM

## 2021-12-14 NOTE — Assessment & Plan Note (Addendum)
Acute, episodic/intermittent. No identified triggers or pattern. Last occurrence several years ago. No red flags on exam today, VSS. Ruled out orthostatic hypotension and BPPV. No arrhythmia present on exam today. Last TSH in spring was normal, no med changes since. Likely psychosomatic related to severe stressors regarding her housing, similar to the last occurrence reported years ago. Encouraged hydration and follow up if it does not resolve or if vision changes, neuro deficits, etc. See AVS for more. In future, could consider thyroid check or Zio patch.

## 2021-12-14 NOTE — Progress Notes (Signed)
    SUBJECTIVE:   CHIEF COMPLAINT / HPI:   Dizziness Dx vertigo a couple years ago, brought on by stress, 7 episodes one day No dizziness since Wednesday morning, tried to get out of bed but felt very dizzy Laid back down on her side - vision got blurry  Today when tried to do orthostatics, as soon as she laid down she got dizzy  Not taking baclofen or any muscle relaxer, not taking hydrocodone (since the epidural injection) Taking gabapentin less than prescribed, 100 mg BID (instead of TID) Still taking metformin and januvia Januvia is new as of maybe 3 months ago  Has not checked blood sugars recently, has not checked them while dizzy  No difficulty walking, feeling unsteady on feet, difficulty using arms and legs, double vision  PERTINENT  PMH / PSH: Scoliosis, dilated ascending aorta, GERD, HLD, hypothyroid, T2DM, schizoaffective disorder, anxious mood, depressed mood, costochondritis, pelvic kidney  OBJECTIVE:   BP (!) 141/79 Comment: Laying  Pulse 78   Ht '5\' 8"'$  (1.727 m)   Wt 192 lb (87.1 kg)   SpO2 100%   BMI 29.19 kg/m    Orthostatic vitals signs:  Laying 141/79, 78 Sitting 138-70, 74 Standing 142/73, 81  PHQ-9:     12/14/2021   11:04 AM 11/13/2021    2:13 PM 11/13/2021    2:09 PM  Depression screen PHQ 2/9  Decreased Interest 0 0 0  Down, Depressed, Hopeless 0 0 0  PHQ - 2 Score 0 0 0  Altered sleeping 0    Tired, decreased energy 0 0   Change in appetite 0 0   Feeling bad or failure about yourself  0 0   Trouble concentrating 0 0   Moving slowly or fidgety/restless 0 0   Suicidal thoughts 0 0   PHQ-9 Score 0    Difficult doing work/chores Not difficult at all      Physical Exam General: Awake, alert, oriented HEENT: PERRL, EOM intact, fundus exam unremarkable, bilateral TM pearly pink and flat, bilateral external auditory canals with minimal cerumen burden, no lesions, nasal mucosa slightly edematous, oral mucosa pink, moist, without lesion, intact  dentition without obvious cavity Lymph: No palpable lymphedema of head or neck Cardiovascular: Regular rate and rhythm, S1 and S2 present, no murmurs auscultated Respiratory: Lung fields clear to auscultation bilaterally Neuro: Cranial nerves II through X grossly intact, able to move all extremities spontaneously, smile even, uvula midline, no tongue deviation, BUE and BLE strength 5/5 and equal bilaterally, sensation intact and equal bilaterally  Dix-Hallpike negative  ASSESSMENT/PLAN:   Dizziness Acute, episodic/intermittent. No identified triggers or pattern. Last occurrence several years ago. No red flags on exam today, VSS. Ruled out orthostatic hypotension and BPPV. No arrhythmia present on exam today. Last TSH in spring was normal, no med changes since. Likely psychosomatic related to severe stressors regarding her housing, similar to the last occurrence reported years ago. Encouraged hydration and follow up if it does not resolve or if vision changes, neuro deficits, etc. See AVS for more. In future, could consider thyroid check or Zio patch.     Ezequiel Essex, MD [place]

## 2021-12-14 NOTE — Patient Instructions (Signed)
It was wonderful to meet you today. Thank you for allowing me to be a part of your care. Below is a short summary of what we discussed at your visit today:  Dizziness We have ruled out big causes of dizziness, including orthostatic hypotension, cardiac arrhythmia (abnormal heart rhythm), and benign positional vertigo.   Stay very well hydrated. Come back if any thing changes, such as worsening vision changes, heart pounding out of your chest, difficulty using your arms and legs.    Please bring all of your medications to every appointment!  If you have any questions or concerns, please do not hesitate to contact us via phone or MyChart message.   Ezequiel Essex, MD

## 2021-12-18 DIAGNOSIS — M25532 Pain in left wrist: Secondary | ICD-10-CM | POA: Diagnosis not present

## 2021-12-24 ENCOUNTER — Encounter: Payer: Self-pay | Admitting: Family Medicine

## 2021-12-24 ENCOUNTER — Ambulatory Visit (INDEPENDENT_AMBULATORY_CARE_PROVIDER_SITE_OTHER): Payer: Medicare Other | Admitting: Family Medicine

## 2021-12-24 VITALS — BP 135/77 | HR 72 | Ht 68.0 in | Wt 192.2 lb

## 2021-12-24 DIAGNOSIS — E119 Type 2 diabetes mellitus without complications: Secondary | ICD-10-CM

## 2021-12-24 DIAGNOSIS — R42 Dizziness and giddiness: Secondary | ICD-10-CM

## 2021-12-24 LAB — POCT GLYCOSYLATED HEMOGLOBIN (HGB A1C): Hemoglobin A1C: 8.1 % — AB (ref 4.0–5.6)

## 2021-12-24 NOTE — Assessment & Plan Note (Signed)
A1c today 8.1. Unchanged from 3 months prior. Patient is controlled; however, with room for improvement. She has recently started Januvia 4 months prior. She is also increasing her exercise and optimizing her nutrition.  - SMART goal for walking and movement daily  - counseling regarding snacking with protein  - F/u in 2 months and will consider Jardiance at that time.

## 2021-12-24 NOTE — Progress Notes (Signed)
    SUBJECTIVE:   CHIEF COMPLAINT / HPI:   Dizziness  Gets dizzy when she lays down on her left side and then gets up. Only on the left side. She says she had vertigo a couple years ago. It went away on it's own. She feels like she's been having the same stress that happened a couple years ago.   DM A1c today 8.1  Starting to exercise. Used to do 3 miles, stopped for a while after a back injury. She has been doing 2 miles. Has not walked in 3 weeks.  Just got an apple watch. Did 8000 steps today.  She meal plans since February. She's been eating out for the past 2 weeks.  She says that she was on 2 medications 3 years ago when her A1c came down to 6 and then was taken off the medication other than metformin. She is not in favor of starting another medication if needed.   PERTINENT  PMH / PSH: T2DM   OBJECTIVE:   BP 135/77   Pulse 72   Ht '5\' 8"'$  (1.727 m)   Wt 87.2 kg   SpO2 100%   BMI 29.22 kg/m   Generally well appearing  CV: RRR, pulses palpable and equal, well perfused  Resp: No increased work of breathing on room air  Abd: Soft, protuberant, non tender, non distended  Diabetic foot exam: large toe nails thickened and yellowed. Toe nails clipped. No obvious dryness, ulcers or lesions. Slight erythema and thickening of medial surface of the 1st MTP joints.   ASSESSMENT/PLAN:   Diabetes mellitus without complication (HCC) F6O today 8.1. Unchanged from 3 months prior. Patient is controlled; however, with room for improvement. She has recently started Januvia 4 months prior. She is also increasing her exercise and optimizing her nutrition.  - SMART goal for walking and movement daily  - counseling regarding snacking with protein  - F/u in 2 months and will consider Jardiance at that time.   Dizziness Patient continues to have dizziness. Most likely orthostatic. Though she has history of vertigo, unlikely vertigo. Dix hallpike negative. Unlikely caused by hypoglycemia as she is  not on a sulfonyrea or insulin.  - Will check BG everytime she feels dizzy and will keep a symptom journal  - F/u in 2 months  - Consider vestibular physical therapy at next visit.      Lowry Ram, MD [place]

## 2021-12-24 NOTE — Assessment & Plan Note (Signed)
Patient continues to have dizziness. Most likely orthostatic. Though she has history of vertigo, unlikely vertigo. Dix hallpike negative. Unlikely caused by hypoglycemia as she is not on a sulfonyrea or insulin.  - Will check BG everytime she feels dizzy and will keep a symptom journal  - F/u in 2 months  - Consider vestibular physical therapy at next visit.

## 2021-12-24 NOTE — Patient Instructions (Addendum)
It was great to see you today! Thank you for choosing [facility] for your primary care. Lori Jones was seen for Diabetes F/u.  Today we addressed: Dizziness - Please check your blood sugar and write it on your phone whenever you feel dizzy.  Diabetes - work on your walking and snacking on more protein. We will talk about possibly starting Jardiance at your next visit.   If you haven't already, sign up for My Chart to have easy access to your labs results, and communication with your primary care physician.  We are checking some labs today. If they are abnormal, I will call you. If they are normal, I will send you a MyChart message (if it is active) or a letter in the mail. If you do not hear about your labs in the next 2 weeks, please call the office.   You should return to our clinic Return in 8 weeks (on 02/17/2022).  I recommend that you always bring your medications to each appointment as this makes it easy to ensure you are on the correct medications and helps [place] not miss refills when you need them.  Please arrive 15 minutes before your appointment to ensure smooth check in process.  We appreciate your efforts in making this happen.  Please call the clinic at [phone] if your symptoms worsen or you have any concerns.  Thank you for allowing me to participate in your care, Lowry Ram, MD 12/24/2021, 4:12 PM PGY-1, [place]

## 2022-01-24 ENCOUNTER — Other Ambulatory Visit: Payer: Self-pay

## 2022-01-24 ENCOUNTER — Encounter (HOSPITAL_BASED_OUTPATIENT_CLINIC_OR_DEPARTMENT_OTHER): Payer: Self-pay

## 2022-01-24 ENCOUNTER — Emergency Department (HOSPITAL_BASED_OUTPATIENT_CLINIC_OR_DEPARTMENT_OTHER)
Admission: EM | Admit: 2022-01-24 | Discharge: 2022-01-24 | Discharge status: Against Medical Advice | Payer: Medicare Other | Attending: Emergency Medicine | Admitting: Emergency Medicine

## 2022-01-24 DIAGNOSIS — Y92410 Unspecified street and highway as the place of occurrence of the external cause: Secondary | ICD-10-CM | POA: Diagnosis not present

## 2022-01-24 DIAGNOSIS — R519 Headache, unspecified: Secondary | ICD-10-CM | POA: Diagnosis not present

## 2022-01-24 DIAGNOSIS — M542 Cervicalgia: Secondary | ICD-10-CM | POA: Insufficient documentation

## 2022-01-24 DIAGNOSIS — M25532 Pain in left wrist: Secondary | ICD-10-CM | POA: Diagnosis not present

## 2022-01-24 DIAGNOSIS — Z5321 Procedure and treatment not carried out due to patient leaving prior to being seen by health care provider: Secondary | ICD-10-CM | POA: Insufficient documentation

## 2022-01-24 DIAGNOSIS — G5602 Carpal tunnel syndrome, left upper limb: Secondary | ICD-10-CM | POA: Diagnosis not present
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
## 2022-01-25 ENCOUNTER — Ambulatory Visit (INDEPENDENT_AMBULATORY_CARE_PROVIDER_SITE_OTHER): Payer: Medicare Other | Admitting: Student

## 2022-01-25 ENCOUNTER — Telehealth: Payer: Self-pay

## 2022-01-25 ENCOUNTER — Telehealth: Payer: Self-pay | Admitting: Student

## 2022-01-25 ENCOUNTER — Ambulatory Visit (HOSPITAL_COMMUNITY)
Admission: RE | Admit: 2022-01-25 | Discharge: 2022-01-25 | Disposition: A | Discharge status: Home / Self Care | Payer: Medicare Other | Source: Ambulatory Visit | Attending: Family Medicine | Admitting: Family Medicine

## 2022-01-25 DIAGNOSIS — Z041 Encounter for examination and observation following transport accident: Secondary | ICD-10-CM | POA: Diagnosis not present

## 2022-01-25 DIAGNOSIS — S0232XA Fracture of orbital floor, left side, initial encounter for closed fracture: Secondary | ICD-10-CM | POA: Diagnosis not present

## 2022-01-25 DIAGNOSIS — S0990XA Unspecified injury of head, initial encounter: Secondary | ICD-10-CM | POA: Diagnosis not present

## 2022-01-25 DIAGNOSIS — S0231XA Fracture of orbital floor, right side, initial encounter for closed fracture: Secondary | ICD-10-CM | POA: Diagnosis not present

## 2022-01-25 NOTE — Patient Instructions (Signed)
It was great seeing you today.  You may get more sore in your neck/back- take tylenol and ibuprofen as needed. You may also use lidocaine patches.  We will get a CT of your head as well   If you have any questions or concerns, please feel free to call the clinic.    Be well,  Dr. Orvis Brill [facility] [phone]
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
## 2022-01-28 ENCOUNTER — Telehealth: Payer: Self-pay | Admitting: Student

## 2022-01-28 ENCOUNTER — Encounter (HOSPITAL_COMMUNITY): Payer: Self-pay

## 2022-01-28 ENCOUNTER — Emergency Department (HOSPITAL_COMMUNITY)
Admission: EM | Admit: 2022-01-28 | Discharge: 2022-01-29 | Disposition: A | Discharge status: Home / Self Care | Payer: Medicare Other | Attending: Student | Admitting: Student

## 2022-01-28 ENCOUNTER — Other Ambulatory Visit: Payer: Self-pay

## 2022-01-28 DIAGNOSIS — Y92410 Unspecified street and highway as the place of occurrence of the external cause: Secondary | ICD-10-CM | POA: Insufficient documentation

## 2022-01-28 DIAGNOSIS — J45909 Unspecified asthma, uncomplicated: Secondary | ICD-10-CM | POA: Diagnosis not present

## 2022-01-28 DIAGNOSIS — R9389 Abnormal findings on diagnostic imaging of other specified body structures: Secondary | ICD-10-CM | POA: Diagnosis not present

## 2022-01-28 DIAGNOSIS — I10 Essential (primary) hypertension: Secondary | ICD-10-CM | POA: Insufficient documentation

## 2022-01-28 DIAGNOSIS — E119 Type 2 diabetes mellitus without complications: Secondary | ICD-10-CM | POA: Diagnosis not present

## 2022-01-28 DIAGNOSIS — Z7984 Long term (current) use of oral hypoglycemic drugs: Secondary | ICD-10-CM | POA: Insufficient documentation

## 2022-01-28 DIAGNOSIS — R519 Headache, unspecified: Secondary | ICD-10-CM | POA: Diagnosis not present

## 2022-01-28 DIAGNOSIS — Z79899 Other long term (current) drug therapy: Secondary | ICD-10-CM | POA: Diagnosis not present

## 2022-01-28 DIAGNOSIS — Z7982 Long term (current) use of aspirin: Secondary | ICD-10-CM | POA: Insufficient documentation

## 2022-01-28 DIAGNOSIS — E039 Hypothyroidism, unspecified: Secondary | ICD-10-CM | POA: Insufficient documentation
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
## 2022-01-28 NOTE — Telephone Encounter (Signed)
Patient returns call to nurse line.   Patient requests to speak with Dameron only in regards to findings.   Patient advised to keep her phone on her today.
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
## 2022-02-05 ENCOUNTER — Ambulatory Visit (INDEPENDENT_AMBULATORY_CARE_PROVIDER_SITE_OTHER): Payer: Medicare Other | Admitting: Family Medicine

## 2022-02-05 VITALS — BP 138/79 | HR 78 | Wt 193.8 lb

## 2022-02-05 DIAGNOSIS — G8929 Other chronic pain: Secondary | ICD-10-CM | POA: Diagnosis not present

## 2022-02-05 DIAGNOSIS — M5412 Radiculopathy, cervical region: Secondary | ICD-10-CM

## 2022-02-05 DIAGNOSIS — S060X0D Concussion without loss of consciousness, subsequent encounter: Secondary | ICD-10-CM | POA: Diagnosis not present

## 2022-02-05 DIAGNOSIS — M549 Dorsalgia, unspecified: Secondary | ICD-10-CM

## 2022-02-05 DIAGNOSIS — H25813 Combined forms of age-related cataract, bilateral: Secondary | ICD-10-CM | POA: Diagnosis not present

## 2022-02-05 DIAGNOSIS — H401131 Primary open-angle glaucoma, bilateral, mild stage: Secondary | ICD-10-CM | POA: Diagnosis not present

## 2022-02-05 DIAGNOSIS — S098XXA Other specified injuries of head, initial encounter: Secondary | ICD-10-CM | POA: Diagnosis not present

## 2022-02-05 DIAGNOSIS — E119 Type 2 diabetes mellitus without complications: Secondary | ICD-10-CM | POA: Diagnosis not present

## 2022-02-05 MED ORDER — BACLOFEN 10 MG PO TABS: 10.0000 mg | ORAL_TABLET | Freq: Three times a day (TID) | ORAL | 0 refills | Status: DC

## 2022-02-05 MED ORDER — NORTRIPTYLINE HCL 10 MG PO CAPS: 10.0000 mg | ORAL_CAPSULE | Freq: Every day | ORAL | 1 refills | Status: DC
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
## 2022-02-05 NOTE — Patient Instructions (Signed)
It was wonderful to see you today.  Please bring ALL of your medications with you to every visit.   Today we talked about:   -You can take Tylenol 1,000 mg three times a day for pain - Start Pamelor every night to prevent headaches - You will be called about your neck imaging (CAT scan)  I have referred you to Physical Therapy and Neurology to further evaluate your concern. If you do not received a phone call about this appointment within 2 weeks, please call our office back at [phone]. Jazmin Hartsell coordinates our referrals and can assist you in this.  **  Please follow up in 2 months   Thank you for choosing [facility].   Please call [phone] with any questions about today's appointment.  Please be sure to schedule follow up at the front  desk before you leave today.   Dorris Singh, MD  Family Medicine

## 2022-02-06 ENCOUNTER — Ambulatory Visit
Admission: RE | Admit: 2022-02-06 | Discharge: 2022-02-06 | Disposition: A | Discharge status: Home / Self Care | Payer: Medicare Other | Source: Ambulatory Visit | Attending: Family Medicine | Admitting: Family Medicine

## 2022-02-06 DIAGNOSIS — M542 Cervicalgia: Secondary | ICD-10-CM | POA: Diagnosis not present

## 2022-02-06 DIAGNOSIS — M5412 Radiculopathy, cervical region: Secondary | ICD-10-CM

## 2022-02-06 DIAGNOSIS — M47812 Spondylosis without myelopathy or radiculopathy, cervical region: Secondary | ICD-10-CM | POA: Diagnosis not present

## 2022-02-07 ENCOUNTER — Encounter: Payer: Self-pay | Admitting: Neurology

## 2022-02-11 ENCOUNTER — Telehealth: Payer: Self-pay | Admitting: Family Medicine
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
## 2022-02-11 NOTE — Telephone Encounter (Signed)
Called with results of CT. No acute findings. Mild stenosis and mild foraminal stenosis--asymptomatic in terms of spinal stenosis.  She reports she is improving, had massage and feeling improved.  All questions answered Dorris Singh, MD  [facility] Medicine Teaching Service

## 2022-02-12 ENCOUNTER — Other Ambulatory Visit: Payer: Self-pay

## 2022-02-12 ENCOUNTER — Ambulatory Visit: Payer: Medicare Other | Attending: Family Medicine | Admitting: Physical Therapy

## 2022-02-12 ENCOUNTER — Encounter: Payer: Self-pay | Admitting: Neurology

## 2022-02-12 ENCOUNTER — Telehealth: Payer: Self-pay | Admitting: Family Medicine

## 2022-02-12 ENCOUNTER — Ambulatory Visit (INDEPENDENT_AMBULATORY_CARE_PROVIDER_SITE_OTHER): Payer: Medicare Other | Admitting: Neurology

## 2022-02-12 ENCOUNTER — Encounter: Payer: Self-pay | Admitting: Physical Therapy

## 2022-02-12 VITALS — BP 159/84 | HR 83 | Resp 20 | Ht 68.0 in | Wt 192.0 lb

## 2022-02-12 DIAGNOSIS — R293 Abnormal posture: Secondary | ICD-10-CM | POA: Insufficient documentation

## 2022-02-12 DIAGNOSIS — M5412 Radiculopathy, cervical region: Secondary | ICD-10-CM | POA: Diagnosis not present

## 2022-02-12 DIAGNOSIS — G4486 Cervicogenic headache: Secondary | ICD-10-CM

## 2022-02-12 DIAGNOSIS — M25612 Stiffness of left shoulder, not elsewhere classified: Secondary | ICD-10-CM | POA: Diagnosis not present

## 2022-02-12 DIAGNOSIS — M542 Cervicalgia: Secondary | ICD-10-CM | POA: Diagnosis not present

## 2022-02-12 DIAGNOSIS — G8929 Other chronic pain: Secondary | ICD-10-CM | POA: Insufficient documentation

## 2022-02-12 DIAGNOSIS — M47812 Spondylosis without myelopathy or radiculopathy, cervical region: Secondary | ICD-10-CM

## 2022-02-12 DIAGNOSIS — M549 Dorsalgia, unspecified: Secondary | ICD-10-CM | POA: Insufficient documentation

## 2022-02-12 MED ORDER — GABAPENTIN 300 MG PO CAPS: 300.0000 mg | ORAL_CAPSULE | Freq: Two times a day (BID) | ORAL | 5 refills | Status: DC

## 2022-02-12 NOTE — Telephone Encounter (Signed)
I spoke with patient on 11/13.Marland KitchenPlease call patient and ask if she has additional questions/concerns. Can follow up with PCP.   Dorris Singh, MD  Family Medicine Teaching Service

## 2022-02-12 NOTE — Telephone Encounter (Signed)
Patient walked in stating she missed  a phone call.  Requesting Dr. Owens Shark call her [phone]  Note found on 02/11/22

## 2022-02-12 NOTE — Telephone Encounter (Signed)
Called patient and she is just trying to figure out who called her.  She was in PT and missed a call.  She has no concerns or questions at this time.  Lori Jones, [place]
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
## 2022-02-12 NOTE — Patient Instructions (Signed)
Increase gabapentin '100mg'$  pill to 2 pills twice daily for one week, the STOP At that point, start gabapentin '300mg'$  1 pill twice daily.  We can increase dose in 4 weeks if needed (contact me) Stop nortriptyline Use baclofen as needed for muscle spasms Continue physical therapy Follow up 4-5 months.

## 2022-02-13 ENCOUNTER — Other Ambulatory Visit: Payer: Self-pay | Admitting: Family Medicine

## 2022-02-14 DIAGNOSIS — S0230XA Fracture of orbital floor, unspecified side, initial encounter for closed fracture: Secondary | ICD-10-CM | POA: Diagnosis not present

## 2022-02-15 ENCOUNTER — Ambulatory Visit (INDEPENDENT_AMBULATORY_CARE_PROVIDER_SITE_OTHER): Payer: Medicare Other | Admitting: Family Medicine

## 2022-02-15 ENCOUNTER — Other Ambulatory Visit: Payer: Self-pay | Admitting: Family Medicine

## 2022-02-15 VITALS — BP 135/75 | HR 65 | Ht 68.0 in | Wt 191.4 lb

## 2022-02-15 DIAGNOSIS — G4486 Cervicogenic headache: Secondary | ICD-10-CM | POA: Insufficient documentation

## 2022-02-15 DIAGNOSIS — E119 Type 2 diabetes mellitus without complications: Secondary | ICD-10-CM | POA: Diagnosis not present

## 2022-02-15 DIAGNOSIS — S134XXS Sprain of ligaments of cervical spine, sequela: Secondary | ICD-10-CM | POA: Diagnosis not present

## 2022-02-15 DIAGNOSIS — S134XXA Sprain of ligaments of cervical spine, initial encounter: Secondary | ICD-10-CM | POA: Diagnosis not present

## 2022-02-15 LAB — POCT GLYCOSYLATED HEMOGLOBIN (HGB A1C): HbA1c, POC (controlled diabetic range): 7.6 % — AB (ref 0.0–7.0)
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
## 2022-02-15 NOTE — Assessment & Plan Note (Addendum)
A1c 8.1 on 9/25. Home meds include Januvia, and metformin. SGLT2 caused yeast infections previously. Stay on Januvia as A1c is decreasing and patient likes it. Patient had Optho exam this year and takes good care of her feet.  - Walking SMART goal increase walking to 3x per week  - F/u in 6 months
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
## 2022-02-15 NOTE — Assessment & Plan Note (Signed)
Neuro recs:  To minimize polypharmacy, I will discontinue nortriptyline for now and titrate up on gabapentin to '300mg'$  twice daily.  We can continue increasing dose to '600mg'$  twice daily in 4 weeks if needed Baclofen as needed for muscle spasms Continue physical therapy on neck Follow up in 4 to 5 months.

## 2022-02-15 NOTE — Addendum Note (Signed)
Addended by: Lowry Ram on: 02/15/2022 03:29 PM   Modules accepted: Level of Service

## 2022-02-17 LAB — BASIC METABOLIC PANEL WITH GFR
BUN/Creatinine Ratio: 13 (ref 12–28)
BUN: 9 mg/dL (ref 8–27)
CO2: 23 mmol/L (ref 20–29)
Calcium: 9.7 mg/dL (ref 8.7–10.3)
Chloride: 104 mmol/L (ref 96–106)
Creatinine, Ser: 0.7 mg/dL (ref 0.57–1.00)
Glucose: 119 mg/dL — ABNORMAL HIGH (ref 70–99)
Potassium: 3.9 mmol/L (ref 3.5–5.2)
Sodium: 141 mmol/L (ref 134–144)
eGFR: 98 mL/min/1.73

## 2022-02-17 LAB — MICROALBUMIN / CREATININE URINE RATIO
Creatinine, Urine: 121.3 mg/dL
Microalb/Creat Ratio: 31 mg/g{creat} — ABNORMAL HIGH (ref 0–29)
Microalbumin, Urine: 37.5 ug/mL
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
## 2022-02-27 ENCOUNTER — Ambulatory Visit: Payer: Medicare Other

## 2022-02-27 DIAGNOSIS — R293 Abnormal posture: Secondary | ICD-10-CM

## 2022-02-27 DIAGNOSIS — M25612 Stiffness of left shoulder, not elsewhere classified: Secondary | ICD-10-CM | POA: Diagnosis not present

## 2022-02-27 DIAGNOSIS — M542 Cervicalgia: Secondary | ICD-10-CM

## 2022-02-27 DIAGNOSIS — M549 Dorsalgia, unspecified: Secondary | ICD-10-CM | POA: Diagnosis not present

## 2022-02-27 DIAGNOSIS — G8929 Other chronic pain: Secondary | ICD-10-CM | POA: Diagnosis not present

## 2022-03-01 ENCOUNTER — Ambulatory Visit: Payer: Medicare Other | Attending: Family Medicine

## 2022-03-01 DIAGNOSIS — M25551 Pain in right hip: Secondary | ICD-10-CM | POA: Insufficient documentation

## 2022-03-01 DIAGNOSIS — M6281 Muscle weakness (generalized): Secondary | ICD-10-CM | POA: Diagnosis not present

## 2022-03-01 DIAGNOSIS — M542 Cervicalgia: Secondary | ICD-10-CM | POA: Insufficient documentation

## 2022-03-01 DIAGNOSIS — M25612 Stiffness of left shoulder, not elsewhere classified: Secondary | ICD-10-CM | POA: Diagnosis not present

## 2022-03-01 DIAGNOSIS — M5459 Other low back pain: Secondary | ICD-10-CM | POA: Insufficient documentation

## 2022-03-01 DIAGNOSIS — R293 Abnormal posture: Secondary | ICD-10-CM | POA: Diagnosis not present
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
## 2022-03-06 ENCOUNTER — Ambulatory Visit: Payer: Medicare Other

## 2022-03-06 DIAGNOSIS — M542 Cervicalgia: Secondary | ICD-10-CM | POA: Diagnosis not present

## 2022-03-06 DIAGNOSIS — M25612 Stiffness of left shoulder, not elsewhere classified: Secondary | ICD-10-CM

## 2022-03-06 DIAGNOSIS — M5459 Other low back pain: Secondary | ICD-10-CM | POA: Diagnosis not present

## 2022-03-06 DIAGNOSIS — R293 Abnormal posture: Secondary | ICD-10-CM | POA: Diagnosis not present

## 2022-03-06 DIAGNOSIS — M6281 Muscle weakness (generalized): Secondary | ICD-10-CM | POA: Diagnosis not present

## 2022-03-06 DIAGNOSIS — M25551 Pain in right hip: Secondary | ICD-10-CM | POA: Diagnosis not present
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
## 2022-03-07 ENCOUNTER — Ambulatory Visit: Payer: Medicare Other

## 2022-03-07 ENCOUNTER — Ambulatory Visit: Payer: Medicare Other | Admitting: Physical Therapy

## 2022-03-07 DIAGNOSIS — M65312 Trigger thumb, left thumb: Secondary | ICD-10-CM | POA: Diagnosis not present

## 2022-03-08 LAB — HM DIABETES EYE EXAM
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
## 2022-03-13 ENCOUNTER — Ambulatory Visit: Payer: Medicare Other

## 2022-03-13 DIAGNOSIS — M542 Cervicalgia: Secondary | ICD-10-CM

## 2022-03-13 DIAGNOSIS — M25551 Pain in right hip: Secondary | ICD-10-CM | POA: Diagnosis not present

## 2022-03-13 DIAGNOSIS — R293 Abnormal posture: Secondary | ICD-10-CM | POA: Diagnosis not present

## 2022-03-13 DIAGNOSIS — M6281 Muscle weakness (generalized): Secondary | ICD-10-CM | POA: Diagnosis not present

## 2022-03-13 DIAGNOSIS — M25612 Stiffness of left shoulder, not elsewhere classified: Secondary | ICD-10-CM | POA: Diagnosis not present

## 2022-03-13 DIAGNOSIS — M5459 Other low back pain: Secondary | ICD-10-CM | POA: Diagnosis not present

## 2022-03-15 ENCOUNTER — Ambulatory Visit: Payer: Medicare Other

## 2022-03-15 ENCOUNTER — Ambulatory Visit (INDEPENDENT_AMBULATORY_CARE_PROVIDER_SITE_OTHER): Payer: Medicare Other | Admitting: Family Medicine

## 2022-03-15 VITALS — BP 120/60 | HR 76 | Ht 68.0 in | Wt 192.0 lb

## 2022-03-15 DIAGNOSIS — R6889 Other general symptoms and signs: Secondary | ICD-10-CM

## 2022-03-15 DIAGNOSIS — M5459 Other low back pain: Secondary | ICD-10-CM | POA: Diagnosis not present

## 2022-03-15 DIAGNOSIS — R293 Abnormal posture: Secondary | ICD-10-CM

## 2022-03-15 DIAGNOSIS — M25551 Pain in right hip: Secondary | ICD-10-CM | POA: Diagnosis not present

## 2022-03-15 DIAGNOSIS — M549 Dorsalgia, unspecified: Secondary | ICD-10-CM | POA: Diagnosis not present

## 2022-03-15 DIAGNOSIS — M542 Cervicalgia: Secondary | ICD-10-CM

## 2022-03-15 DIAGNOSIS — M25552 Pain in left hip: Secondary | ICD-10-CM | POA: Diagnosis not present

## 2022-03-15 DIAGNOSIS — M6281 Muscle weakness (generalized): Secondary | ICD-10-CM | POA: Diagnosis not present

## 2022-03-15 DIAGNOSIS — M25612 Stiffness of left shoulder, not elsewhere classified: Secondary | ICD-10-CM

## 2022-03-15 DIAGNOSIS — G8929 Other chronic pain: Secondary | ICD-10-CM
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
## 2022-03-15 NOTE — Patient Instructions (Signed)
I am not seeing anything very concerning with your head when looking at it and I don't think that we need further imaging. I would continue to take the medications you have and would recommend follow-up with the neurologist if these symptoms are persisting because I don't know what exact resources would be helpful in this scenario.   For the physical therapist, I have put in a referral to help with your lower back and left hip as well.

## 2022-03-20 ENCOUNTER — Ambulatory Visit: Payer: Medicare Other

## 2022-03-20 DIAGNOSIS — M25551 Pain in right hip: Secondary | ICD-10-CM | POA: Diagnosis not present

## 2022-03-20 DIAGNOSIS — M5459 Other low back pain: Secondary | ICD-10-CM | POA: Diagnosis not present

## 2022-03-20 DIAGNOSIS — R293 Abnormal posture: Secondary | ICD-10-CM | POA: Diagnosis not present

## 2022-03-20 DIAGNOSIS — M25612 Stiffness of left shoulder, not elsewhere classified: Secondary | ICD-10-CM | POA: Diagnosis not present

## 2022-03-20 DIAGNOSIS — M6281 Muscle weakness (generalized): Secondary | ICD-10-CM | POA: Diagnosis not present

## 2022-03-20 DIAGNOSIS — M542 Cervicalgia: Secondary | ICD-10-CM | POA: Diagnosis not present
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
## 2022-03-22 ENCOUNTER — Ambulatory Visit: Payer: Medicare Other

## 2022-03-22 DIAGNOSIS — M25551 Pain in right hip: Secondary | ICD-10-CM | POA: Diagnosis not present

## 2022-03-22 DIAGNOSIS — M25612 Stiffness of left shoulder, not elsewhere classified: Secondary | ICD-10-CM | POA: Diagnosis not present

## 2022-03-22 DIAGNOSIS — R293 Abnormal posture: Secondary | ICD-10-CM

## 2022-03-22 DIAGNOSIS — M5459 Other low back pain: Secondary | ICD-10-CM | POA: Diagnosis not present

## 2022-03-22 DIAGNOSIS — M6281 Muscle weakness (generalized): Secondary | ICD-10-CM | POA: Diagnosis not present

## 2022-03-22 DIAGNOSIS — M542 Cervicalgia: Secondary | ICD-10-CM | POA: Diagnosis not present

## 2022-03-24 ENCOUNTER — Other Ambulatory Visit: Payer: Self-pay | Admitting: Student

## 2022-03-24 DIAGNOSIS — E089 Diabetes mellitus due to underlying condition without complications: Secondary | ICD-10-CM

## 2022-03-27 ENCOUNTER — Ambulatory Visit: Payer: Medicare Other

## 2022-03-28 ENCOUNTER — Other Ambulatory Visit: Payer: Self-pay | Admitting: *Deleted

## 2022-03-28 DIAGNOSIS — E089 Diabetes mellitus due to underlying condition without complications: Secondary | ICD-10-CM

## 2022-03-28 MED ORDER — METFORMIN HCL 1000 MG PO TABS: 1000.0000 mg | ORAL_TABLET | Freq: Two times a day (BID) | ORAL | 3 refills | Status: DC

## 2022-03-29 ENCOUNTER — Ambulatory Visit: Payer: Medicare Other

## 2022-03-29 DIAGNOSIS — M25612 Stiffness of left shoulder, not elsewhere classified: Secondary | ICD-10-CM | POA: Diagnosis not present

## 2022-03-29 DIAGNOSIS — M542 Cervicalgia: Secondary | ICD-10-CM

## 2022-03-29 DIAGNOSIS — M5459 Other low back pain: Secondary | ICD-10-CM

## 2022-03-29 DIAGNOSIS — M25551 Pain in right hip: Secondary | ICD-10-CM | POA: Diagnosis not present

## 2022-03-29 DIAGNOSIS — R293 Abnormal posture: Secondary | ICD-10-CM | POA: Diagnosis not present

## 2022-03-29 DIAGNOSIS — M6281 Muscle weakness (generalized): Secondary | ICD-10-CM | POA: Diagnosis not present
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
## 2022-04-02 ENCOUNTER — Ambulatory Visit: Payer: No Typology Code available for payment source | Attending: Family Medicine

## 2022-04-02 DIAGNOSIS — M6281 Muscle weakness (generalized): Secondary | ICD-10-CM | POA: Diagnosis not present

## 2022-04-02 DIAGNOSIS — M5459 Other low back pain: Secondary | ICD-10-CM | POA: Insufficient documentation

## 2022-04-02 DIAGNOSIS — M25551 Pain in right hip: Secondary | ICD-10-CM | POA: Insufficient documentation
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
## 2022-04-05 ENCOUNTER — Encounter: Payer: Self-pay | Admitting: Physical Therapy

## 2022-04-05 ENCOUNTER — Ambulatory Visit: Payer: No Typology Code available for payment source

## 2022-04-05 ENCOUNTER — Ambulatory Visit: Payer: No Typology Code available for payment source | Admitting: Physical Therapy

## 2022-04-05 DIAGNOSIS — M5459 Other low back pain: Secondary | ICD-10-CM | POA: Diagnosis not present

## 2022-04-05 DIAGNOSIS — M25551 Pain in right hip: Secondary | ICD-10-CM | POA: Diagnosis not present

## 2022-04-05 DIAGNOSIS — M6281 Muscle weakness (generalized): Secondary | ICD-10-CM | POA: Diagnosis not present
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
## 2022-04-10 ENCOUNTER — Ambulatory Visit: Payer: No Typology Code available for payment source

## 2022-04-10 DIAGNOSIS — M6281 Muscle weakness (generalized): Secondary | ICD-10-CM | POA: Diagnosis not present

## 2022-04-10 DIAGNOSIS — M5459 Other low back pain: Secondary | ICD-10-CM

## 2022-04-10 DIAGNOSIS — M25551 Pain in right hip: Secondary | ICD-10-CM | POA: Diagnosis not present
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
## 2022-04-12 ENCOUNTER — Ambulatory Visit: Payer: No Typology Code available for payment source

## 2022-04-12 DIAGNOSIS — M5459 Other low back pain: Secondary | ICD-10-CM | POA: Diagnosis not present

## 2022-04-12 DIAGNOSIS — M25551 Pain in right hip: Secondary | ICD-10-CM

## 2022-04-12 DIAGNOSIS — M6281 Muscle weakness (generalized): Secondary | ICD-10-CM | POA: Diagnosis not present
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
## 2022-04-17 ENCOUNTER — Ambulatory Visit: Payer: No Typology Code available for payment source

## 2022-04-17 DIAGNOSIS — M5459 Other low back pain: Secondary | ICD-10-CM

## 2022-04-17 DIAGNOSIS — M25551 Pain in right hip: Secondary | ICD-10-CM | POA: Diagnosis not present

## 2022-04-17 DIAGNOSIS — M6281 Muscle weakness (generalized): Secondary | ICD-10-CM
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
## 2022-04-19 ENCOUNTER — Ambulatory Visit: Payer: No Typology Code available for payment source

## 2022-04-19 DIAGNOSIS — M6281 Muscle weakness (generalized): Secondary | ICD-10-CM | POA: Diagnosis not present

## 2022-04-19 DIAGNOSIS — M5459 Other low back pain: Secondary | ICD-10-CM | POA: Diagnosis not present

## 2022-04-19 DIAGNOSIS — M25551 Pain in right hip: Secondary | ICD-10-CM
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
## 2022-04-23 NOTE — Progress Notes (Unsigned)
    SUBJECTIVE:   CHIEF COMPLAINT / HPI:   Sneezing, throat-she notes last Wednesday she was serving the homeless on the streets and did not wear a mask.  And then on Friday started to have a sore throat that then got better.  On Monday she started sneezing about twice per day and has some pressure in the sinuses over her eyes when she sneezes.  She notes that she sometimes has a headache with this but when she takes Tylenol it goes away.  She denies any headache now.  She denies any vision changes.  She denies any chest pain, shortness of breath, fevers, chills, cough, sputum production, nausea, vomiting.  She can negative home COVID test 2 days ago.  Overall she notes she is feeling better.  She has been using honey and lemon and Tylenol at home. Denies any leg swelling.  Having rhinorrhea as well  PERTINENT  PMH / PSH: T2DM, HLD, schizoaffective d/o  OBJECTIVE:   BP 118/62   Pulse 91   Ht '5\' 8"'$  (1.727 m)   Wt 193 lb 9.6 oz (87.8 kg)   SpO2 98%   BMI 29.44 kg/m   General: A&O, NAD HEENT: No sign of trauma, EOM grossly intact, nares clear without mucous bilaterally Cardiac: RRR, no m/r/g Respiratory: CTAB, normal WOB, no w/c/r GI: Soft, NTTP, non-distended  Extremities: NTTP, no peripheral edema. Neuro: Normal gait, moves all four extremities appropriately. Psych: Appropriate mood and affect   ASSESSMENT/PLAN:   Viral URI Overall exam reassuring, not hypoxic, without systemic symptoms, no signs of bacterial sinusitis Discussed supportive care including nasal saline and Flonase, given return precautions in AVS     Lenoria Chime, MD [place]

## 2022-04-24 ENCOUNTER — Ambulatory Visit (INDEPENDENT_AMBULATORY_CARE_PROVIDER_SITE_OTHER): Payer: 59 | Admitting: Family Medicine

## 2022-04-24 ENCOUNTER — Ambulatory Visit: Payer: No Typology Code available for payment source

## 2022-04-24 VITALS — BP 118/62 | HR 91 | Ht 68.0 in | Wt 193.6 lb

## 2022-04-24 DIAGNOSIS — J069 Acute upper respiratory infection, unspecified: Secondary | ICD-10-CM | POA: Insufficient documentation

## 2022-04-24 DIAGNOSIS — E039 Hypothyroidism, unspecified: Secondary | ICD-10-CM | POA: Diagnosis not present

## 2022-04-24 MED ORDER — SITAGLIPTIN PHOSPHATE 25 MG PO TABS: ORAL_TABLET | ORAL | 0 refills | Status: DC

## 2022-04-24 MED ORDER — LEVOTHYROXINE SODIUM 25 MCG PO TABS: ORAL_TABLET | ORAL | 2 refills | Status: DC

## 2022-04-24 NOTE — Patient Instructions (Addendum)
It was wonderful to see you today.  Please bring ALL of your medications with you to every visit.   Today we talked about:  I think you have a virus! Your exam is reassuring, you can continue to take tylenol as needed for headaches, and call if you have worsening headaches that dont go away with Tylenol, chest pain, trouble breathing, leg swelling, or high fevers.  Thank you for choosing [facility].   Please call [phone] with any questions about today's appointment.  Please arrive at least 15 minutes prior to your scheduled appointments.   If you had blood work today, I will send you a MyChart message or a letter if results are normal. Otherwise, I will give you a call.   If you had a referral placed, they will call you to set up an appointment. Please give [place] a call if you don't hear back in the next 2 weeks.   If you need additional refills before your next appointment, please call your pharmacy first.   Yehuda Savannah, MD  Family Medicine

## 2022-04-24 NOTE — Assessment & Plan Note (Signed)
Overall exam reassuring, not hypoxic, without systemic symptoms, no signs of bacterial sinusitis Discussed supportive care including nasal saline and Flonase, given return precautions in AVS

## 2022-04-26 ENCOUNTER — Ambulatory Visit: Payer: No Typology Code available for payment source

## 2022-05-01 ENCOUNTER — Ambulatory Visit: Payer: No Typology Code available for payment source

## 2022-05-01 DIAGNOSIS — M25551 Pain in right hip: Secondary | ICD-10-CM

## 2022-05-01 DIAGNOSIS — M5459 Other low back pain: Secondary | ICD-10-CM | POA: Diagnosis not present

## 2022-05-01 DIAGNOSIS — M6281 Muscle weakness (generalized): Secondary | ICD-10-CM | POA: Diagnosis not present

## 2022-05-01 NOTE — Therapy (Signed)
OUTPATIENT PHYSICAL THERAPY TREATMENT NOTE    PHYSICAL THERAPY DISCHARGE SUMMARY  Visits from Start of Care: 16  Current functional level related to goals / functional outcomes: All goals met   Remaining deficits: N/a   Education / Equipment: See education below   Patient agrees to discharge. Patient goals were met. Patient is being discharged due to meeting the stated rehab goals.  Patient Name: Lori Jones MRN: [id number] DOB:06-02-1960, 62 y.o., female Today's Date: 05/01/2022  PCP: Lowry Ram, MD  REFERRING PROVIDER: Martyn Malay, MD (neck); Lilland, Alana, DO (back and hip)   END OF SESSION:   PT End of Session - 05/01/22 0847     Visit Number 16    Number of Visits 21    Date for PT Re-Evaluation 05/11/22    Authorization Type [employer] Medicare    PT Start Time 0847    PT Stop Time 0929    PT Time Calculation (min) 42 min    Activity Tolerance Patient tolerated treatment well    Behavior During Therapy WFL for tasks assessed/performed                      Past Medical History:  Diagnosis Date   Allergy    Anemia    Anginal pain ([place])    hx of CPs   Anxiety    Arthritis    Ascending aorta dilatation (HCC)    55m by echo 08/2020   Asthma    hx of in childhood    Back pain, chronic    Bursitis    Depression    Diabetes mellitus    Foot fracture, right    Heart murmur    Herniated disc    Hypercholesteremia    Hypertension    no meds   Hypothyroid    Kidney disease    "pelvic kidney" bilat    Scoliosis    [place]    hx of in childhood and again in college    Stroke ([facility]    hx of sun stroke while in college    Past Surgical History:  Procedure Laterality Date   ABDOMINAL HYSTERECTOMY     CESAREAN SECTION     CHOLECYSTECTOMY N/A 09/08/2014   Procedure: LAPAROSCOPIC CHOLECYSTECTOMY;  Surgeon: DCoralie Keens MD;  Location: [facility];  Service: General;  Laterality: N/A;   HAND SURGERY     dog bite   INCISIONAL  HERNIA REPAIR N/A 04/24/2016   Procedure: HERNIA REPAIR INCISIONAL;  Surgeon: DCoralie Keens MD;  [place]  Service: General;  Laterality: N/A;   INSERTION OF MESH N/A 04/24/2016   Procedure: INSERTION OF MESH;  Surgeon: DCoralie Keens MD;  [place]  Service: General;  Laterality: N/A;   Thumb surgery     VENTRAL HERNIA REPAIR N/A 09/08/2014   Procedure: VENTRAL HERNIA REPAIR;  Surgeon: DCoralie Keens MD;  Location: [facility];  Service: General;  Laterality: N/A;   Patient Active Problem List   Diagnosis Date Noted   Viral URI 04/24/2022   Cervicogenic headache 02/15/2022   Pelvic kidney 11/29/2020   [place]ending [place] dil[place] ([place] 09/22/2020   Idiopathic scoliosis 04/05/2020   Costochondritis 09/14/2019   Dizziness 04/30/2018   Anxiety and depression 03/20/2018   Gastroesophageal reflux disease 03/20/2018   Chronic back pain 02/08/2015   HLD (hyperlipidemia) 10/06/2013   Hypothyroidism 08/06/2013   Schizoaffective disorder, unspecified type ([place] 07/10/2013   Diabetes mellitus without complication ([place] 060/63/0160  Heart murmur  07/10/2013    REFERRING DIAG: M54.9,G89.29 (ICD-10-CM) - Chronic back pain, unspecified back location, unspecified back pain laterality   M25.552 (ICD-10-CM) - Left hip pain   THERAPY DIAG:  Other low back pain  Muscle weakness (generalized)  Pain in right hip  Rationale for Evaluation and Treatment Rehabilitation  PERTINENT HISTORY: DM, GERD, anxiety/depression, ascending aorta dilatation, orbital fractures; L carpal tunnel surgery May 2023; scoliosis   PRECAUTIONS: none   SUBJECTIVE:                                                                                                                                                                                      SUBJECTIVE STATEMENT:   Patient reports she is continuing to feel great without reports of pain.   PAIN:  Are you having pain? No   OBJECTIVE: (objective  measures completed at initial evaluation unless otherwise dated)  DIAGNOSTIC FINDINGS:  [facility] 01/25/22 impression:  IMPRESSION: 1. No acute intracranial abnormality. 2. Bilateral remote orbital blowout fractures.   CT Cervical Spine 02/08/22 impression: IMPRESSION: No recent fracture is seen. Cervical spondylosis with encroachment of neural foramina from C2-T1 levels. There is mild spinal stenosis at C5-C6 level.   PATIENT SURVEYS:  FOTO 44%, 59% predicted 03/20/22: 51% function  03/29/22: 83% function    COGNITION: Overall cognitive status: Within functional limits for tasks assessed   SENSATION: Light touch intact BUE aside from mildly diminished C4-5 on L compared to R   POSTURE: mild forward head/rounded posture, B UT elevation more notable on R than L   PALPATION: Concordant tenderness L LS and UT, improves with brief/gentle STM           03/29/22: TTP Rt greater trochanter and gluteal musculature    CERVICAL ROM:    Active ROM A/PROM (deg) eval 02/27/22 03/06/22 03/15/22 03/20/22 03/29/22  Flexion ~100% painless     FULL  Extension 50% p!    50 WNL  Right lateral flexion         Left lateral flexion         Right rotation 23 deg 35 38 49  55  Left rotation 25 deg muscle discomfort 35 41 41  52   (Blank rows = not tested)   UPPER EXTREMITY ROM:   Active ROM Right eval Left eval 03/29/22   Shoulder flexion [facility] WFL painful Bilateral WNL and pain free   Shoulder abduction       Shoulder internal rotation       Shoulder external rotation       Elbow flexion       Elbow extension       Wrist flexion  Wrist extension        (Blank rows = not tested) Comments:LUE grossly symmetrical elevation compared to R but provokes concordant neck pain and referral into lateral UE UPPER EXTREMITY MMT: 03/29/22: Gross shoulder strength 4+/5 without pain   LUMBAR ROM:   Active  A/PROM  eval 05/01/22  Flexion 25% limited  WNL  Extension 25% limited  WNL  Right  lateral flexion WNL   Left lateral flexion WNL   Right rotation WNL   Left rotation WNL    (Blank rows = not tested) LOWER EXTREMITY MMT:    MMT Right eval Left eval 05/01/22  Hip flexion 4 pn 4+ 5/5 bilateral   Hip extension 4- pn 4 4+/5 Rt; 5/5 Lt  Hip abduction 3+ pn 4 4+/5 bilateral  Hip adduction     Hip internal rotation     Hip external rotation     Knee flexion     Knee extension     Ankle dorsiflexion     Ankle plantarflexion     Ankle inversion     Ankle eversion      (Blank rows = not tested)     FUNCTIONAL TESTS:  Functional reaching - WNL LUE but painful, reproduces neck/UE pain  Bending to pick up object on floor: excessive trunk flexion, limited knee flexion/hip flexion   04/02/22: 6 MWT: 1584 ft Rt hip pain after 5 minutes rated as 4/10 at conclusion ; 04/05/22:1853 ft no pain   05/01/22: 6 MWT 1855, no pain; functional lifting of 15 lbs no reports of pain  SPECIAL TESTS: SLR (-) FABER and FADIR (+ RLE)  [facility] Adult PT Treatment:                                                DATE: 05/01/22 Therapeutic Exercise: 6 MWT Reviewed and updated hip/back and neck/shoulder HEP demonstrating and returning demo as needed discussing frequency, sets, reps and ways to progress independently.  Resisted hip abduction green band 2 x 10  Resisted hip extension green band 2 x 10   Therapeutic Activity: Re-assessment to determine overall progress, educating patient on progress towards goals.    [facility] Adult PT Treatment:                                                DATE: 04/19/22 Therapeutic Exercise: Elliptical R: 4, L: 2 5 min LE only Feet elevated on flat side of bosu for posterior pelvic tilts x20 with exhale Feet elevated on flat side of bosu for bridging x10 Forearm plank on high table with thoracic rotation x10 - max verbal cues for form Plank with hands on flat side of bosu on low table (DC for wrist pain) Lateral stepping with green band around ankles and Pilates  ball squeeze between hands x4 laps - verbal cues for hip/knee/ankle alignment   [facility] Adult PT Treatment:                                                DATE: 04/17/22 Therapeutic Exercise: Treadmill: incline 3%, speed 2.0-2.5  IT band  stretch 2 x 30 sec Figure 4 2 x 30 sec Supine pelvic tilts multiple reps  Seated pelvic tilts multiple reps Hip bridge with posterior pelvic tilt partial range 2 x 10    [facility] Adult PT Treatment:                                                DATE: 04/12/22 Therapeutic Exercise: Treadmill: incline 3%, Speed 2.3-5 mph x 5 min LTR with figure 4, slowly holding each direction 10 seconds x 6 Sidelying clam slow x8 B with green band above knees - tactile and verbal cues for pelvic neutral and core activation Partial bridge with green band above knees x10 Partial squat in front of mat with green band above knees x10 - mirror feedback and tactile feedback for hip hinge Side stepping with green band above knees 2 laps x15' - mod to max verbal cues for form      PATIENT EDUCATION:  Education details: see treatment; d/c education Person educated: patient Education method: instruction, demo, cues, handout Education comprehension: verbalized understanding, returned demo    HOME EXERCISE PROGRAM: Access Code: [email or web address] (neck)  Access Code: [email or web address] (back) URL: [email or web address] Date: 03/29/2022 Prepared by: Chauncey with Resistance  - 1 x daily - 7 x weekly - 2 sets - 10 reps - Supine Active Straight Leg Raise  - 1 x daily - 7 x weekly - 2 sets - 10 reps  ASSESSMENT:   CLINICAL IMPRESSION: Samuel has made excellent functional progress in regards to her back, hip, and neck pain. She reports she has been feeling great without reports of pain. She has met all established functional goals and demonstrates independence with advanced home program. She is therefore appropriate for discharge at this time with  patient in agreement with this plan.   OBJECTIVE IMPAIRMENTS: decreased activity tolerance, decreased ROM, decreased strength, hypomobility, impaired UE functional use, postural dysfunction, and pain.    ACTIVITY LIMITATIONS: carrying, lifting, dressing, and reach over head, bending   PARTICIPATION LIMITATIONS: cleaning, laundry, and driving, shopping   PERSONAL FACTORS: Past/current experiences and 3+ comorbidities: GERD, anxiety/depression, cardiovascular  are also affecting patient's functional outcome.    REHAB POTENTIAL: Fair given comorbidities and past experience   CLINICAL DECISION MAKING: Evolving/moderate complexity   EVALUATION COMPLEXITY: Moderate     GOALS: Goals reviewed with patient? No   SHORT TERM GOALS: Target date: 04/19/2022     Pt will demonstrate appropriate understanding and performance of initially prescribed HEP in order to facilitate improved independence with management of symptoms.  Baseline: HEP deferred given time constraints Goal status: met   2. Pt will score greater than or equal to 53% on FOTO in order to demonstrate improved perception of function due to symptoms.            Baseline: 44%            Goal status: met   3. Therapist will perform 6 minute walk test and set appropriate LTG.   Baseline: no time to capture  Goal status: met    LONG TERM GOALS: Target date: 05/10/2022     Pt will score 59% on FOTO in order to demonstrate improved perception of function due to symptoms. Baseline: 44% Goal status: met   Pt will demonstrate at least 50 degrees of active cervical rotation  ROM B in order to demonstrate improved environmental awareness and safety with driving.  Baseline: see ROM chart above Goal status: met   Pt will demonstrate at least 4+/5 shoulder MMT for improved symmetry of UE strength and improved tolerance to functional movements.  Baseline: deferred on eval given symptom irritability Goal status: met    Pt will  report/demonstrate ability to reach overhead with less than 2 point increase on NPS in order to demonstrate improved tolerance to functional activities such as reaching into cabinets. Baseline: symmetrical but painful Goal status: met  5. Patient will be able to lift at least 15 lbs with proper form without onset of back pain.    Baseline: aberrant mechanics   Goal status: met   6. Patient will demonstrate at least 4+/5 bilateral hip abductor and extensor strength to improve stability about the chain with prolonged walking activity.    Baseline: see MMT above   Goal status: met  7. Patient will be independent with advanced home program to assist in management of her chronic condition.    Baseline: initial HEP issued   Goal status: met  8. Patient will walk at least 1700 ft with </= 2/10 hip pain during 6 MWT to improve her tolerance to community ambulation.    Baseline: see above   Goal status: MET 04/05/22      PLAN:   PT FREQUENCY: n/a   PT DURATION: n/a   PLANNED INTERVENTIONS: Therapeutic exercises, Therapeutic activity, Neuromuscular re-education, Balance training, Gait training, Patient/Family education, Self Care, Joint mobilization, Vestibular training, Dry Needling, Electrical stimulation, Spinal mobilization, Cryotherapy, Moist heat, Taping, Manual therapy, and Re-evaluation   PLAN FOR NEXT SESSION: n/a  Gwendolyn Grant, PT, DPT, ATC 05/01/22 9:30 AM
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
## 2022-05-03 ENCOUNTER — Encounter: Payer: Self-pay | Admitting: Neurology

## 2022-05-03 ENCOUNTER — Ambulatory Visit (INDEPENDENT_AMBULATORY_CARE_PROVIDER_SITE_OTHER): Payer: 59 | Admitting: Neurology

## 2022-05-03 VITALS — BP 151/88 | HR 91 | Resp 18 | Ht 67.5 in | Wt 194.0 lb

## 2022-05-03 DIAGNOSIS — G4486 Cervicogenic headache: Secondary | ICD-10-CM | POA: Diagnosis not present

## 2022-05-03 DIAGNOSIS — M47812 Spondylosis without myelopathy or radiculopathy, cervical region: Secondary | ICD-10-CM | POA: Diagnosis not present

## 2022-05-03 MED ORDER — GABAPENTIN 100 MG PO CAPS: 100.0000 mg | ORAL_CAPSULE | Freq: Three times a day (TID) | ORAL | 3 refills | Status: AC

## 2022-06-06 ENCOUNTER — Ambulatory Visit (INDEPENDENT_AMBULATORY_CARE_PROVIDER_SITE_OTHER): Payer: 59 | Admitting: Family Medicine

## 2022-06-06 ENCOUNTER — Ambulatory Visit
Admission: RE | Admit: 2022-06-06 | Discharge: 2022-06-06 | Disposition: A | Discharge status: Home / Self Care | Payer: 59 | Source: Ambulatory Visit | Attending: Family Medicine | Admitting: Family Medicine

## 2022-06-06 VITALS — BP 124/79 | HR 77 | Temp 98.4°F | Ht 67.5 in | Wt 194.0 lb

## 2022-06-06 DIAGNOSIS — S99922A Unspecified injury of left foot, initial encounter: Secondary | ICD-10-CM

## 2022-06-06 DIAGNOSIS — S92425A Nondisplaced fracture of distal phalanx of left great toe, initial encounter for closed fracture: Secondary | ICD-10-CM | POA: Diagnosis not present

## 2022-06-06 NOTE — Assessment & Plan Note (Signed)
S/p trip and fall 2 days ago. She has swelling, bruising, and tenderness to palpation of proximal phalanx raising suspicion for possible fracture. Will obtain x-ray of left great toe. Patient given post-op shoe and advised Tylenol/NSAIDs, rest, and ice for now.

## 2022-06-06 NOTE — Patient Instructions (Addendum)
It was great to see you!  I have ordered an x-ray of your toe. You can go to [facility] ([place]) any time between 7am-7pm to have this done. No appointment needed. I will send you a MyChart message with the results.  In the meantime, you can use a postop shoe for comfort.  You can also apply ice and take Tylenol or Ibuprofen as needed for pain.  Take care and seek immediate care sooner if you develop any concerns.  Dr. [facility]

## 2022-06-06 NOTE — Progress Notes (Signed)
    SUBJECTIVE:   CHIEF COMPLAINT / HPI:   Left big toe pain Got her foot caught on a bag handle while walking into her closet. Incident occurred 2 days ago. Didn't have the closet light on because it seemed bright enough from the room. Fortunately fell onto a package of paper towels so did not strike head or sustain other significant injury. No LOC. Had left great toe pain immediately afterward and since that time. Some swelling and bruising of the big toe as well. Has been walking but with a limp. Hasn't taken any meds for the pain. Hasn't tried ice or anything else for relief.  PERTINENT  PMH / PSH: schizoaffective disorder, anxiety, depression, diabetes, hypothyroidism  OBJECTIVE:   BP 124/79   Pulse 77   Temp 98.4 F (36.9 C)   Ht 5' 7.5" (1.715 m)   Wt 194 lb (88 kg)   SpO2 99%   BMI 29.94 kg/m   General: NAD, pleasant, able to participate in exam Respiratory: No respiratory distress Skin: warm and dry, no rashes noted Psych: Normal affect and mood Neuro: grossly intact MSK: L foot- inspection reveals bruising and swelling over dorsal great toe. There is moderate tenderness to palpation of proximal phalanx of left great toe. No tenderness elsewhere in the foot. FROM of toes, foot and ankle. Antalgic gait secondary to pain.  ASSESSMENT/PLAN:   Injury of left great toe S/p trip and fall 2 days ago. She has swelling, bruising, and tenderness to palpation of proximal phalanx raising suspicion for possible fracture. Will obtain x-ray of left great toe. Patient given post-op shoe and advised Tylenol/NSAIDs, rest, and ice for now.    Alcus Dad, MD [place]

## 2022-06-10 ENCOUNTER — Telehealth: Payer: Self-pay

## 2022-06-10 DIAGNOSIS — S99922A Unspecified injury of left foot, initial encounter: Secondary | ICD-10-CM

## 2022-06-10 NOTE — Telephone Encounter (Signed)
Patient calls nurse line regarding results from toe X-ray.   She is requesting referral to [facility] for fractured toe.   Forwarding to Dr. Rock Nephew.   Talbot Grumbling, RN

## 2022-06-10 NOTE — Telephone Encounter (Signed)
Returned patient's call. Discussed x-ray findings showing non-displaced fracture of L great toe. Referral placed to [facility] per her request.   Alcus Dad, MD PGY-3, [place]

## 2022-06-11 DIAGNOSIS — M79672 Pain in left foot: Secondary | ICD-10-CM | POA: Diagnosis not present

## 2022-06-21 ENCOUNTER — Ambulatory Visit: Payer: Medicare Other | Admitting: Neurology

## 2022-06-25 DIAGNOSIS — M79672 Pain in left foot: Secondary | ICD-10-CM | POA: Diagnosis not present

## 2022-07-01 ENCOUNTER — Ambulatory Visit: Payer: Medicare Other | Admitting: Neurology

## 2022-07-05 DIAGNOSIS — M79672 Pain in left foot: Secondary | ICD-10-CM | POA: Diagnosis not present

## 2022-07-19 DIAGNOSIS — M79672 Pain in left foot: Secondary | ICD-10-CM | POA: Diagnosis not present

## 2022-08-12 ENCOUNTER — Other Ambulatory Visit: Payer: Self-pay | Admitting: Family Medicine

## 2022-09-10 DIAGNOSIS — Z1231 Encounter for screening mammogram for malignant neoplasm of breast: Secondary | ICD-10-CM | POA: Diagnosis not present

## 2022-09-21 ENCOUNTER — Other Ambulatory Visit: Payer: Self-pay | Admitting: Family Medicine

## 2022-09-21 DIAGNOSIS — E089 Diabetes mellitus due to underlying condition without complications: Secondary | ICD-10-CM

## 2022-09-24 DIAGNOSIS — M65312 Trigger thumb, left thumb: Secondary | ICD-10-CM | POA: Diagnosis not present

## 2022-10-01 ENCOUNTER — Emergency Department (HOSPITAL_COMMUNITY)
Admission: EM | Admit: 2022-10-01 | Discharge: 2022-10-01 | Disposition: A | Discharge status: Home / Self Care | Payer: 59 | Attending: Emergency Medicine | Admitting: Emergency Medicine

## 2022-10-01 DIAGNOSIS — Z79899 Other long term (current) drug therapy: Secondary | ICD-10-CM | POA: Insufficient documentation

## 2022-10-01 DIAGNOSIS — I10 Essential (primary) hypertension: Secondary | ICD-10-CM | POA: Diagnosis not present

## 2022-10-01 DIAGNOSIS — Z7982 Long term (current) use of aspirin: Secondary | ICD-10-CM | POA: Diagnosis not present

## 2022-10-01 DIAGNOSIS — Z8673 Personal history of transient ischemic attack (TIA), and cerebral infarction without residual deficits: Secondary | ICD-10-CM | POA: Insufficient documentation

## 2022-10-01 DIAGNOSIS — E119 Type 2 diabetes mellitus without complications: Secondary | ICD-10-CM | POA: Insufficient documentation

## 2022-10-01 DIAGNOSIS — Z7984 Long term (current) use of oral hypoglycemic drugs: Secondary | ICD-10-CM | POA: Insufficient documentation

## 2022-10-01 DIAGNOSIS — J45909 Unspecified asthma, uncomplicated: Secondary | ICD-10-CM | POA: Diagnosis not present

## 2022-10-01 DIAGNOSIS — E039 Hypothyroidism, unspecified: Secondary | ICD-10-CM | POA: Diagnosis not present

## 2022-10-01 DIAGNOSIS — T594X1A Toxic effect of chlorine gas, accidental (unintentional), initial encounter: Secondary | ICD-10-CM | POA: Diagnosis not present

## 2022-10-01 DIAGNOSIS — T5991XA Toxic effect of unspecified gases, fumes and vapors, accidental (unintentional), initial encounter: Secondary | ICD-10-CM

## 2022-10-01 DIAGNOSIS — R059 Cough, unspecified: Secondary | ICD-10-CM | POA: Diagnosis present

## 2022-10-01 NOTE — ED Provider Notes (Signed)
[place] EMERGENCY DEPARTMENT AT [facility] Provider Note  CSN: [id number] Arrival date & time: 10/01/22 1628  Chief Complaint(s) Cough  HPI Lori Jones is a 62 y.o. female with history of hypertension, hyperlipidemia presenting to the emergency department with cough.  Patient reports cough for the past 4 hours.  Reports that this began after she was mixing bleach and ammonia to clean her floors.  She noticed it smelled bad and she would have to go outside due to the fumes.  She reports that later she went to the bank and was coughing a lot, she told her friend about mixing bleach and ammonia and her friend was worried and told her she should come to the emergency department.  She denies any shortness of breath.  No chest pain.  She reports mild sore throat.  No difficulty breathing or difficulty swallowing.   Past Medical History Past Medical History:  Diagnosis Date   Allergy    Anemia    Anginal pain (HCC)    hx of CPs   Anxiety    Arthritis    Ascending aorta dilatation (HCC)    38mm by echo 08/2020   Asthma    hx of in childhood    Back pain, chronic    Bursitis    Depression    Diabetes mellitus    Foot fracture, right    Heart murmur    Herniated disc    Hypercholesteremia    Hypertension    no meds   Hypothyroid    Kidney disease    "pelvic kidney" bilat    Scoliosis    Seizures (HCC)    hx of in childhood and again in college    Stroke [facility])    hx of sun stroke while in college    Patient Active Problem List   Diagnosis Date Noted   Injury of left great toe 06/06/2022   Cervicogenic headache 02/15/2022   Pelvic kidney 11/29/2020   Ascending aorta dilatation (HCC) 09/22/2020   Idiopathic scoliosis 04/05/2020   Costochondritis 09/14/2019   Dizziness 04/30/2018   Anxiety and depression 03/20/2018   Gastroesophageal reflux disease 03/20/2018   Chronic back pain 02/08/2015   HLD (hyperlipidemia) 10/06/2013   Hypothyroidism 08/06/2013    Schizoaffective disorder, unspecified type (HCC) 07/10/2013   Diabetes mellitus without complication (HCC) 07/10/2013   Heart murmur 07/10/2013   Home Medication(s) Prior to Admission medications   Medication Sig Start Date End Date Taking? Authorizing Provider  ACCU-CHEK GUIDE test strip USE UP TO 4 TIMES DAILY AS DIRECTED 11/29/21   Lockie Mola, MD  Accu-Chek Softclix Lancets lancets USE UP TO FOUR TIMES DAILY AS DIRECTED 09/28/21   Brimage, Seward Meth, DO  aspirin EC 81 MG tablet Take 1 tablet (81 mg total) by mouth daily. 05/09/17   Beaulah Dinning, MD  atorvastatin (LIPITOR) 40 MG tablet TAKE 1 TABLET(40 MG) BY MOUTH DAILY 09/17/21   Katha Cabal, DO  b complex vitamins capsule Take 1 capsule by mouth daily.    [provider]  baclofen (LIORESAL) 10 MG tablet Take 1 tablet (10 mg total) by mouth 3 (three) times daily. 02/05/22   Westley Chandler, MD  blood glucose meter kit and supplies KIT Dispense based on patient and insurance preference. Use up to four times daily as directed. Patient not taking: Reported on 05/03/2022 07/12/21   Katha Cabal, DO  ELDERBERRY PO Take by mouth.    [provider]  escitalopram (LEXAPRO) 10 MG tablet  TAKE 1 TABLET(10 MG) BY MOUTH DAILY 07/30/21   Brimage, Seward Meth, DO  gabapentin (NEURONTIN) 100 MG capsule Take 1 capsule (100 mg total) by mouth 3 (three) times daily. 05/03/22   Drema Dallas, DO  levothyroxine (SYNTHROID) 25 MCG tablet Take daily 30 minutes before breakfast 04/24/22   Billey Co, MD  metFORMIN (GLUCOPHAGE) 1000 MG tablet TAKE 1 TABLET(1000 MG) BY MOUTH TWICE DAILY WITH A MEAL 09/23/22   Lockie Mola, MD  Multiple Vitamins-Minerals (ZINC PO) Take by mouth.    [provider]  sitaGLIPtin (JANUVIA) 25 MG tablet TAKE 1 TABLET(25 MG) BY MOUTH DAILY AFTER BREAKFAST 08/13/22   Lockie Mola, MD  Turmeric (QC TUMERIC COMPLEX PO) Take by mouth.    [provider]  zinc sulfate 220 (50 Zn) MG capsule Take 220  mg by mouth daily.    [provider]                                                                                                                                    Past Surgical History Past Surgical History:  Procedure Laterality Date   ABDOMINAL HYSTERECTOMY     CESAREAN SECTION     CHOLECYSTECTOMY N/A 09/08/2014   Procedure: LAPAROSCOPIC CHOLECYSTECTOMY;  Surgeon: Abigail Miyamoto, MD;  Location: [facility] ORS;  Service: General;  Laterality: N/A;   HAND SURGERY     dog bite   INCISIONAL HERNIA REPAIR N/A 04/24/2016   Procedure: HERNIA REPAIR INCISIONAL;  Surgeon: Abigail Miyamoto, MD;  Location: [facility] OR;  Service: General;  Laterality: N/A;   INSERTION OF MESH N/A 04/24/2016   Procedure: INSERTION OF MESH;  Surgeon: Abigail Miyamoto, MD;  Location: [facility] OR;  Service: General;  Laterality: N/A;   Thumb surgery     VENTRAL HERNIA REPAIR N/A 09/08/2014   Procedure: VENTRAL HERNIA REPAIR;  Surgeon: Abigail Miyamoto, MD;  Location: [facility] ORS;  Service: General;  Laterality: N/A;   Family History Family History  Problem Relation Age of Onset   Cancer Mother    Depression Mother    Diabetes Mother    Heart disease Mother 37       Stents   Hypertension Mother    Hyperlipidemia Mother    Thyroid disease Mother    Cancer Father    Sickle cell anemia Brother    Diabetes Sister     Social History Social History   Tobacco Use   Smoking status: Never    Passive exposure: Never   Smokeless tobacco: Never  Vaping Use   Vaping Use: Never used  Substance Use Topics   Alcohol use: No    Alcohol/week: 0.0 standard drinks of alcohol   Drug use: No   Allergies Clarithromycin, Sumatriptan, Canagliflozin, Canagliflozin-metformin hcl, Chlorhexidine, and Sumatriptan succinate  Review of Systems Review of Systems  All other systems reviewed and are negative.   Physical Exam Vital Signs  I have reviewed the triage  vital signs BP (!) 153/83 (BP Location: Right Arm)   Pulse 81   Temp  98.4 F (36.9 C) (Oral)   Resp 18   Ht 5\' 8"  (1.727 m)   Wt 87.1 kg   SpO2 98%   BMI 29.19 kg/m  Physical Exam Vitals and nursing note reviewed.  Constitutional:      General: She is not in acute distress.    Appearance: She is well-developed.  HENT:     Head: Normocephalic and atraumatic.     Mouth/Throat:     Mouth: Mucous membranes are moist.     Pharynx: Posterior oropharyngeal erythema (very mild) present.     Comments: No oropharyngeal swelling, no stridor Eyes:     Pupils: Pupils are equal, round, and reactive to light.  Cardiovascular:     Rate and Rhythm: Normal rate and regular rhythm.     Heart sounds: No murmur heard. Pulmonary:     Effort: Pulmonary effort is normal. No respiratory distress.     Breath sounds: Normal breath sounds.  Abdominal:     General: Abdomen is flat.     Palpations: Abdomen is soft.     Tenderness: There is no abdominal tenderness.  Musculoskeletal:        General: No tenderness.     Right lower leg: No edema.     Left lower leg: No edema.  Skin:    General: Skin is warm and dry.  Neurological:     General: No focal deficit present.     Mental Status: She is alert. Mental status is at baseline.  Psychiatric:        Mood and Affect: Mood normal.        Behavior: Behavior normal.     ED Results and Treatments Labs (all labs ordered are listed, but only abnormal results are displayed) Labs Reviewed - No data to display                                                                                                                        Radiology No results found.  Pertinent labs & imaging results that were available during my care of the patient were reviewed by me and considered in my medical decision making (see MDM for details).  Medications Ordered in ED Medications - No data to display  Procedures Procedures  (including critical care time)  Medical Decision Making / ED Course   MDM:  62 year old female presenting to the emergency department after mixing ammonia and bleach and developing a cough.  Patient likely had exposure to chloramine gas given combination of bleach and ammonia.  It is now over 4 hours after her exposure, and her lungs are clear, she is not hypoxic, and she is not tachypneic.  She has no orofacial swelling or stridor.  I think the risk of any further progression of her symptoms is extremely low and she should be safe for discharge.  Counseled the patient thoroughly on never to combine bleach or ammonia.  Discussed that although the risk of progression of her symptoms is very low given the time since exposure, if she does experience any worsening symptoms she should immediately return to the emergency department.      Additional history obtained: -Additional history obtained from spouse and friend    Medicines ordered and prescription drug management: No orders of the defined types were placed in this encounter.   -I have reviewed the patients home medicines and have made adjustments as needed  Co morbidities that complicate the patient evaluation  Past Medical History:  Diagnosis Date   Allergy    Anemia    Anginal pain (HCC)    hx of CPs   Anxiety    Arthritis    Ascending aorta dilatation (HCC)    38mm by echo 08/2020   Asthma    hx of in childhood    Back pain, chronic    Bursitis    Depression    Diabetes mellitus    Foot fracture, right    Heart murmur    Herniated disc    Hypercholesteremia    Hypertension    no meds   Hypothyroid    Kidney disease    "pelvic kidney" bilat    Scoliosis    Seizures (HCC)    hx of in childhood and again in college    Stroke [facility]    hx of sun stroke while in college       Dispostion: Disposition decision including need for hospitalization was considered, and patient discharged  from emergency department.    Final Clinical Impression(s) / ED Diagnoses Final diagnoses:  Toxic effect of gas exposure, accidental or unintentional, initial encounter     This chart was dictated using voice recognition software.  Despite best efforts to proofread,  errors can occur which can change the documentation meaning.    Lonell Grandchild, MD 10/01/22 [phone]

## 2022-10-01 NOTE — ED Triage Notes (Addendum)
Pt to ED c/o cough x 3 hours, reports was mopping the floor with bleach when symptoms occurred. Concerned exposed to fumes when mopping. No s/s of acute distress noted in triage.

## 2022-10-01 NOTE — Discharge Instructions (Addendum)
We evaluated you for your cough after you combined bleach and ammonia.   Please combine bleach and ammonia.  This can cause dangerous fumes to be released which can irritate your throat and lungs.  Since this accident occurred 4 hours ago, I think it is safe to go home.  Your oxygen level is normal and your lungs sound normal as well.  For your sore throat, please drink hot tea with lemon and honey.  Your cough is due to irritation of your lungs.  I think the chance that your symptoms will get worse as very low, but if you do experience any worsening symptoms such as difficulty breathing, lightheadedness or dizziness, fainting, swelling or any other concerning symptoms, please return for repeat evaluation.

## 2022-10-04 ENCOUNTER — Telehealth: Payer: Self-pay | Admitting: *Deleted

## 2022-10-04 NOTE — Transitions of Care (Post Inpatient/ED Visit) (Signed)
10/04/2022  Name: Lori Jones MRN: [id number] DOB: 04/14/1960  Today's TOC FU Call Status: Today's TOC FU Call Status:: Successful TOC FU Call Competed TOC FU Call Complete Date: 10/04/22  Transition Care Management Follow-up Telephone Call Date of Discharge: 10/02/22 Discharge Facility: [facility]) Type of Discharge: Emergency Department Reason for ED Visit: Other: (toxic effect of gas exposure, accidental or unintentional) How have you been since you were released from the hospital?: Better Any questions or concerns?: No  Items Reviewed: Did you receive and understand the discharge instructions provided?: Yes Medications obtained,verified, and reconciled?: Yes (Medications Reviewed) Any new allergies since your discharge?: No Dietary orders reviewed?: No Do you have support at home?: Yes People in Home: alone Name of Support/Comfort Primary Source: Miranda  Medications Reviewed Today: Medications Reviewed Today     Reviewed by Luella Cook, RN (Case Manager) on 10/04/22 at 1628  Med List Status: <None>   Medication Order Taking? Sig Documenting Provider Last Dose Status Informant  ACCU-CHEK GUIDE test strip [id number]  USE UP TO 4 TIMES DAILY AS DIRECTED Lockie Mola, MD  Active   Accu-Chek Softclix Lancets lancets [id number]  USE UP TO FOUR TIMES DAILY AS DIRECTED Brimage, Vondra, DO  Active   aspirin EC 81 MG tablet [id number] Yes Take 1 tablet (81 mg total) by mouth daily. Beaulah Dinning, MD Taking Active Self  atorvastatin (LIPITOR) 40 MG tablet [id number] Yes TAKE 1 TABLET(40 MG) BY MOUTH DAILY Brimage, Vondra, DO Taking Active   b complex vitamins capsule [id number] Yes Take 1 capsule by mouth daily. [provider] Taking Active   baclofen (LIORESAL) 10 MG tablet [id number] Yes Take 1 tablet (10 mg total) by mouth 3 (three) times daily. Westley Chandler, MD Taking Active            Med Note Anner Crete, [facility]   Thu Jun 06, 2022  9:43 AM) prn  blood  glucose meter kit and supplies KIT [id number]  Dispense based on patient and insurance preference. Use up to four times daily as directed.  Patient not taking: Reported on 05/03/2022   Katha Cabal, DO  Active   ELDERBERRY PO [id number] Yes Take by mouth. [provider] Taking Active   escitalopram (LEXAPRO) 10 MG tablet [id number] Yes TAKE 1 TABLET(10 MG) BY MOUTH DAILY Brimage, Vondra, DO Taking Active   gabapentin (NEURONTIN) 100 MG capsule [id number] Yes Take 1 capsule (100 mg total) by mouth 3 (three) times daily. Drema Dallas, DO Taking Active   levothyroxine (SYNTHROID) 25 MCG tablet [id number] Yes Take daily 30 minutes before breakfast Billey Co, MD Taking Active   metFORMIN (GLUCOPHAGE) 1000 MG tablet [id number] Yes TAKE 1 TABLET(1000 MG) BY MOUTH TWICE DAILY WITH A MEAL Lockie Mola, MD Taking Active   Multiple Vitamins-Minerals (ZINC PO) [id number] Yes Take by mouth. [provider] Taking Active   sitaGLIPtin (JANUVIA) 25 MG tablet [id number] Yes TAKE 1 TABLET(25 MG) BY MOUTH DAILY AFTER Retta Mac, MD Taking Active   Turmeric (QC TUMERIC COMPLEX PO) [id number] Yes Take by mouth. [provider] Taking Active   zinc sulfate 220 (50 Zn) MG capsule [id number] Yes Take 220 mg by mouth daily. [provider] Taking Active             Home Care and Equipment/Supplies: Were Home Health Services Ordered?: NA Any new equipment or medical supplies ordered?: NA  Functional Questionnaire: Do you need assistance with bathing/showering or dressing?: No Do you need  assistance with meal preparation?: No Do you need assistance with eating?: No Do you have difficulty maintaining continence: No Do you need assistance with getting out of bed/getting out of a chair/moving?: No Do you have difficulty managing or taking your medications?: No  Follow up appointments reviewed: PCP Follow-up appointment confirmed?: NA (As needed) Specialist  Hospital Follow-up appointment confirmed?: NA Do you need transportation to your follow-up appointment?: No Do you understand care options if your condition(s) worsen?: Yes-patient verbalized understanding  SDOH Interventions Today    Flowsheet Row Most Recent Value  SDOH Interventions   Food Insecurity Interventions Intervention Not Indicated  Housing Interventions Intervention Not Indicated  Transportation Interventions Intervention Not Indicated      Interventions Today    Flowsheet Row Most Recent Value  General Interventions   General Interventions Discussed/Reviewed General Interventions Discussed, General Interventions Reviewed, Doctor Visits  [Can make visit with PCP as needed]      TOC Interventions Today    Flowsheet Row Most Recent Value  TOC Interventions   TOC Interventions Discussed/Reviewed TOC Interventions Discussed, TOC Interventions Reviewed  [Being careful with mixing household chemicals]       Gean Maidens BSN RN [facility] Care Management [phone]

## 2022-10-10 ENCOUNTER — Other Ambulatory Visit: Payer: Self-pay | Admitting: Family Medicine

## 2022-10-10 DIAGNOSIS — E785 Hyperlipidemia, unspecified: Secondary | ICD-10-CM

## 2022-10-10 DIAGNOSIS — E039 Hypothyroidism, unspecified: Secondary | ICD-10-CM

## 2022-10-10 DIAGNOSIS — E089 Diabetes mellitus due to underlying condition without complications: Secondary | ICD-10-CM

## 2022-10-11 ENCOUNTER — Ambulatory Visit (INDEPENDENT_AMBULATORY_CARE_PROVIDER_SITE_OTHER): Payer: 59

## 2022-10-11 VITALS — Ht 68.0 in | Wt 192.0 lb

## 2022-10-11 DIAGNOSIS — Z0000 Encounter for general adult medical examination without abnormal findings: Secondary | ICD-10-CM

## 2022-10-11 NOTE — Patient Instructions (Signed)
Ms. Degarmo , Thank you for taking time to come for your Medicare Wellness Visit. I appreciate your ongoing commitment to your health goals. Please review the following plan we discussed and let me know if I can assist you in the future.   These are the goals we discussed:  Goals       Eat more fruits and vegetables (pt-stated)      Remain active and independent      Weight (lb) < 165 lb (74.8 kg) (pt-stated)      6% weight loss        This is a list of the screening recommended for you and due dates:  Health Maintenance  Topic Date Due   Zoster (Shingles) Vaccine (1 of 2) Never done   COVID-19 Vaccine (7 - 2023-24 season) 11/30/2021   Complete foot exam   02/08/2022   Hemoglobin A1C  08/16/2022   Colon Cancer Screening  12/18/2022   Flu Shot  10/31/2022   Yearly kidney function blood test for diabetes  02/16/2023   Yearly kidney health urinalysis for diabetes  02/16/2023   Eye exam for diabetics  03/09/2023   Medicare Annual Wellness Visit  10/11/2023   Pap Smear  02/19/2024   Mammogram  10/08/2024   DTaP/Tdap/Td vaccine (2 - Td or Tdap) 06/21/2025   Hepatitis C Screening  Completed   HIV Screening  Completed   HPV Vaccine  Aged Out    Advanced directives: Information on Advanced Care Planning can be found at [place] [facility] Advance Health Care Directives Advance Health Care Directives ([email or web address] Please bring a copy of your health care power of attorney and living will to the office to be added to your chart at your convenience.  Conditions/risks identified: Aim for 30 minutes of exercise or brisk walking, 6-8 glasses of water, and 5 servings of fruits and vegetables each day.  Next appointment: Follow up in one year for your annual wellness visit.   Preventive Care 40-64 Years, Female Preventive care refers to lifestyle choices and visits with your health care provider that can promote health and wellness. What does preventive care include? A yearly  physical exam. This is also called an annual well check. Dental exams once or twice a year. Routine eye exams. Ask your health care provider how often you should have your eyes checked. Personal lifestyle choices, including: Daily care of your teeth and gums. Regular physical activity. Eating a healthy diet. Avoiding tobacco and drug use. Limiting alcohol use. Practicing safe sex. Taking low-dose aspirin daily starting at age 60. Taking vitamin and mineral supplements as recommended by your health care provider. What happens during an annual well check? The services and screenings done by your health care provider during your annual well check will depend on your age, overall health, lifestyle risk factors, and family history of disease. Counseling  Your health care provider may ask you questions about your: Alcohol use. Tobacco use. Drug use. Emotional well-being. Home and relationship well-being. Sexual activity. Eating habits. Work and work Astron[employer]. Method of birth control. Menstrual cycle. Pregnancy history. Screening  You may have the following tests or measurements: Height, weight, and BMI. Blood pressure. Lipid and cholesterol levels. These may be checked every 5 years, or more frequently if you are over 62 years old. Skin check. Lung cancer screening. You may have this screening every year starting at age 59 if you have a 30-pack-year history of smoking and currently smoke or have quit within the  past 15 years. Fecal occult blood test (FOBT) of the stool. You may have this test every year starting at age 67. Flexible sigmoidoscopy or colonoscopy. You may have a sigmoidoscopy every 5 years or a colonoscopy every 10 years starting at age 67. Hepatitis C blood test. Hepatitis B blood test. Sexually transmitted disease (STD) testing. Diabetes screening. This is done by checking your blood sugar (glucose) after you have not eaten for a while (fasting). You may have this  done every 1-3 years. Mammogram. This may be done every 1-2 years. Talk to your health care provider about when you should start having regular mammograms. This may depend on whether you have a family history of breast cancer. BRCA-related cancer screening. This may be done if you have a family history of breast, ovarian, tubal, or peritoneal cancers. Pelvic exam and Pap test. This may be done every 3 years starting at age 26. Starting at age 68, this may be done every 5 years if you have a Pap test in combination with an HPV test. Bone density scan. This is done to screen for osteoporosis. You may have this scan if you are at high risk for osteoporosis. Discuss your test results, treatment options, and if necessary, the need for more tests with your health care provider. Vaccines  Your health care provider may recommend certain vaccines, such as: Influenza vaccine. This is recommended every year. Tetanus, diphtheria, and acellular pertussis (Tdap, Td) vaccine. You may need a Td booster every 10 years. Zoster vaccine. You may need this after age 55. Pneumococcal 13-valent conjugate (PCV13) vaccine. You may need this if you have certain conditions and were not previously vaccinated. Pneumococcal polysaccharide (PPSV23) vaccine. You may need one or two doses if you smoke cigarettes or if you have certain conditions. Talk to your health care provider about which screenings and vaccines you need and how often you need them. This information is not intended to replace advice given to you by your health care provider. Make sure you discuss any questions you have with your health care provider. Document Released: 04/14/2015 Document Revised: 12/06/2015 Document Reviewed: 01/17/2015 Elsevier Interactive Patient Education  2017 [employer].    Fall Prevention in the Home Falls can cause injuries. They can happen to people of all ages. There are many things you can do to make your home safe and to help  prevent falls. What can I do on the outside of my home? Regularly fix the edges of walkways and driveways and fix any cracks. Remove anything that might make you trip as you walk through a door, such as a raised step or threshold. Trim any bushes or trees on the path to your home. Use bright outdoor lighting. Clear any walking paths of anything that might make someone trip, such as rocks or tools. Regularly check to see if handrails are loose or broken. Make sure that both sides of any steps have handrails. Any raised decks and porches should have guardrails on the edges. Have any leaves, snow, or ice cleared regularly. Use sand or salt on walking paths during winter. Clean up any spills in your garage right away. This includes oil or grease spills. What can I do in the bathroom? Use night lights. Install grab bars by the toilet and in the tub and shower. Do not use towel bars as grab bars. Use non-skid mats or decals in the tub or shower. If you need to sit down in the shower, use a plastic, non-slip stool.  Keep the floor dry. Clean up any water that spills on the floor as soon as it happens. Remove soap buildup in the tub or shower regularly. Attach bath mats securely with double-sided non-slip rug tape. Do not have throw rugs and other things on the floor that can make you trip. What can I do in the bedroom? Use night lights. Make sure that you have a light by your bed that is easy to reach. Do not use any sheets or blankets that are too big for your bed. They should not hang down onto the floor. Have a firm chair that has side arms. You can use this for support while you get dressed. Do not have throw rugs and other things on the floor that can make you trip. What can I do in the kitchen? Clean up any spills right away. Avoid walking on wet floors. Keep items that you use a lot in easy-to-reach places. If you need to reach something above you, use a strong step stool that has a  grab bar. Keep electrical cords out of the way. Do not use floor polish or wax that makes floors slippery. If you must use wax, use non-skid floor wax. Do not have throw rugs and other things on the floor that can make you trip. What can I do with my stairs? Do not leave any items on the stairs. Make sure that there are handrails on both sides of the stairs and use them. Fix handrails that are broken or loose. Make sure that handrails are as long as the stairways. Check any carpeting to make sure that it is firmly attached to the stairs. Fix any carpet that is loose or worn. Avoid having throw rugs at the top or bottom of the stairs. If you do have throw rugs, attach them to the floor with carpet tape. Make sure that you have a light switch at the top of the stairs and the bottom of the stairs. If you do not have them, ask someone to add them for you. What else can I do to help prevent falls? Wear shoes that: Do not have high heels. Have rubber bottoms. Are comfortable and fit you well. Are closed at the toe. Do not wear sandals. If you use a stepladder: Make sure that it is fully opened. Do not climb a closed stepladder. Make sure that both sides of the stepladder are locked into place. Ask someone to hold it for you, if possible. Clearly mark and make sure that you can see: Any grab bars or handrails. First and last steps. Where the edge of each step is. Use tools that help you move around (mobility aids) if they are needed. These include: Canes. Walkers. Scooters. Crutches. Turn on the lights when you go into a dark area. Replace any light bulbs as soon as they burn out. Set up your furniture so you have a clear path. Avoid moving your furniture around. If any of your floors are uneven, fix them. If there are any pets around you, be aware of where they are. Review your medicines with your doctor. Some medicines can make you feel dizzy. This can increase your chance of  falling. Ask your doctor what other things that you can do to help prevent falls. This information is not intended to replace advice given to you by your health care provider. Make sure you discuss any questions you have with your health care provider. Document Released: 01/12/2009 Document Revised: 08/24/2015 Document Reviewed: 04/22/2014  Risk analyst Patient Education  [employer].

## 2022-10-11 NOTE — Progress Notes (Cosign Needed)
Subjective:   Lori Jones is a 62 y.o. female who presents for Medicare Annual (Subsequent) preventive examination.  Visit Complete: Virtual  I connected with  Devoria Glassing on 10/11/22 by a audio enabled telemedicine application and verified that I am speaking with the correct person using two identifiers.  Patient Location: Home  Provider Location: Home Office  I discussed the limitations of evaluation and management by telemedicine. The patient expressed understanding and agreed to proceed.  Review of Systems     Cardiac Risk Factors include: diabetes mellitus;dyslipidemia  Per patient no change in vitals since last visit, unable to obtain new vitals due to telehealth visit     Objective:    Today's Vitals   10/11/22 1801  Weight: 192 lb (87.1 kg)  Height: 5\' 8"  (1.727 m)   Body mass index is 29.19 kg/m.     10/11/2022    6:08 PM 10/01/2022    4:35 PM 06/06/2022    9:35 AM 05/03/2022   10:33 AM 04/24/2022    2:43 PM 03/15/2022    3:03 PM 02/15/2022    1:45 PM  Advanced Directives  Does Patient Have a Medical Advance Directive? No No No No No No No  Would patient like information on creating a medical advance directive? Yes (MAU/Ambulatory/Procedural Areas - Information given) No - Patient declined No - Patient declined  No - Patient declined No - Patient declined No - Patient declined    Current Medications (verified) Outpatient Encounter Medications as of 10/11/2022  Medication Sig   ACCU-CHEK GUIDE test strip USE UP TO 4 TIMES DAILY AS DIRECTED   Accu-Chek Softclix Lancets lancets USE UP TO FOUR TIMES DAILY AS DIRECTED   aspirin EC 81 MG tablet Take 1 tablet (81 mg total) by mouth daily.   atorvastatin (LIPITOR) 40 MG tablet TAKE 1 TABLET(40 MG) BY MOUTH DAILY   b complex vitamins capsule Take 1 capsule by mouth daily.   baclofen (LIORESAL) 10 MG tablet Take 1 tablet (10 mg total) by mouth 3 (three) times daily.   blood glucose meter kit and supplies KIT  Dispense based on patient and insurance preference. Use up to four times daily as directed.   ELDERBERRY PO Take by mouth.   escitalopram (LEXAPRO) 10 MG tablet TAKE 1 TABLET(10 MG) BY MOUTH DAILY   gabapentin (NEURONTIN) 100 MG capsule Take 1 capsule (100 mg total) by mouth 3 (three) times daily.   levothyroxine (SYNTHROID) 25 MCG tablet TAKE 1 TABLET BY MOUTH DAILY 30 MINUTES BEFORE BREAKFAST   metFORMIN (GLUCOPHAGE) 1000 MG tablet TAKE 1 TABLET(1000 MG) BY MOUTH TWICE DAILY WITH A MEAL   Multiple Vitamins-Minerals (ZINC PO) Take by mouth.   sitaGLIPtin (JANUVIA) 25 MG tablet TAKE 1 TABLET(25 MG) BY MOUTH DAILY AFTER BREAKFAST   Turmeric (QC TUMERIC COMPLEX PO) Take by mouth.   zinc sulfate 220 (50 Zn) MG capsule Take 220 mg by mouth daily.   No facility-administered encounter medications on file as of 10/11/2022.    Allergies (verified) Clarithromycin, Sumatriptan, Canagliflozin, Canagliflozin-metformin hcl, Chlorhexidine, and Sumatriptan succinate   History: Past Medical History:  Diagnosis Date   Allergy    Anemia    Anginal pain (HCC)    hx of CPs   Anxiety    Arthritis    Ascending aorta dilatation (HCC)    38mm by echo 08/2020   Asthma    hx of in childhood    Back pain, chronic    Bursitis  Depression    Diabetes mellitus    Foot fracture, right    Heart murmur    Herniated disc    Hypercholesteremia    Hypertension    no meds   Hypothyroid    Kidney disease    "pelvic kidney" bilat    Scoliosis    Seizures (HCC)    hx of in childhood and again in college    Stroke [facility])    hx of sun stroke while in college    Past Surgical History:  Procedure Laterality Date   ABDOMINAL HYSTERECTOMY     CESAREAN SECTION     CHOLECYSTECTOMY N/A 09/08/2014   Procedure: LAPAROSCOPIC CHOLECYSTECTOMY;  Surgeon: Abigail Miyamoto, MD;  Location: [facility] ORS;  Service: General;  Laterality: N/A;   HAND SURGERY     dog bite   INCISIONAL HERNIA REPAIR N/A 04/24/2016   Procedure:  HERNIA REPAIR INCISIONAL;  Surgeon: Abigail Miyamoto, MD;  Location: [facility] OR;  Service: General;  Laterality: N/A;   INSERTION OF MESH N/A 04/24/2016   Procedure: INSERTION OF MESH;  Surgeon: Abigail Miyamoto, MD;  Location: [facility] OR;  Service: General;  Laterality: N/A;   Thumb surgery     VENTRAL HERNIA REPAIR N/A 09/08/2014   Procedure: VENTRAL HERNIA REPAIR;  Surgeon: Abigail Miyamoto, MD;  Location: [facility] OR[facility];  Service: General;  Laterality: N/A;   Family History  Problem Relation Age of Onset   Cancer Mother    Depression Mother    Diabetes Mother    Heart disease Mother 74       Stents   Hypertension Mother    Hyperlipidemia Mother    Thyroid disease Mother    Cancer Father    Sickle cell anemia Brother    Diabetes Sister    Social History   Socioeconomic History   Marital status: Single    Spouse name: Not on file   Number of children: 1   Years of education: 16   Highest education level: Bachelor's degree (e.g., BA, AB, BS)  Occupational History   Occupation: Retired    Comment: Social Work  Tobacco Use   Smoking status: Never    Passive exposure: Never   Smokeless tobacco: Never  Vaping Use   Vaping status: Never Used  Substance and Sexual Activity   Alcohol use: No    Alcohol/week: 0.0 standard drinks of alcohol   Drug use: No   Sexual activity: Not Currently  Other Topics Concern   Not on file  Social History Narrative   Patient lives alone in [place].    Patient has bachelors in social work. 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 Does not work full time.   Patient has one daughter who lives in [place], no grandchildren.    Patient enjoys reading, watching westerns, and fellowship.   Patient walks for exercise 4x per week.    Social Determinants of Health   Financial Resource Strain: Low Risk  (10/11/2022)   Overall Financial Resource Strain (CARDIA)    Difficulty of Paying Living Expenses: Not hard at all  Food  Insecurity: No Food Insecurity (10/11/2022)   Hunger Vital Sign    Worried About Running Out of Food in the Last Year: Never true    Ran Out of Food in the Last Year: Never true  Transportation Needs: No Transportation Needs (10/11/2022)   PRAPARE - Administrator, Civil Service (Medical): No  Lack of Transportation (Non-Medical): No  Physical Activity: Insufficiently Active (10/11/2022)   Exercise Vital Sign    Days of Exercise per Week: 4 days    Minutes of Exercise per Session: 30 min  Stress: No Stress Concern Present (10/11/2022)   [employer] of Occupational Health - Occupational Stress Questionnaire    Feeling of Stress : Not at all  Social Connections: Moderately Integrated (10/11/2022)   Social Connection and Isolation Panel [NHANES]    Frequency of Communication with Friends and Family: More than three times a week    Frequency of Social Gatherings with Friends and Family: Three times a week    Attends Religious Services: More than 4 times per year    Active Member of Clubs or Organizations: Yes    Attends Engineer, structural: More than 4 times per year    Marital Status: Never married    Tobacco Counseling Counseling given: Not Answered   Clinical Intake:  Pre-visit preparation completed: Yes  Pain : No/denies pain     Diabetes: Yes CBG done?: No Did pt. bring in CBG monitor from home?: No  How often do you need to have someone help you when you read instructions, pamphlets, or other written materials from your doctor or pharmacy?: 1 - Never  Interpreter Needed?: No  Information entered by :: Kandis Fantasia LPN   Activities of Daily Living    10/11/2022    6:07 PM 11/13/2021    2:14 PM  In your present state of health, do you have any difficulty performing the following activities:  Hearing? 0 0  Vision? 0 0  Difficulty concentrating or making decisions? 0 0  Walking or climbing stairs? 0 0  Dressing or bathing? 0 0  Doing  errands, shopping? 0 0  Preparing Food and eating ? N N  Using the Toilet? N N  In the past six months, have you accidently leaked urine? N N  Do you have problems with loss of bowel control? N N  Managing your Medications? N N  Managing your Finances? N N  Housekeeping or managing your Housekeeping? N N    Patient Care Team: Lockie Mola, MD as PCP - General (Family Medicine) Quintella Reichert, MD as PCP - Cardiology (Cardiology) Ernesto Rutherford, MD as Consulting Physician (Ophthalmology) Charna Elizabeth, MD as Consulting Physician (Gastroenterology) Bradly Bienenstock, MD as Consulting Physician (Orthopedic Surgery) Abigail Miyamoto, MD as Consulting Physician (General Surgery) Mitchel Honour, DO as Consulting Physician (Obstetrics and Gynecology) Juanell Fairly, RN as Case Manager  Indicate any recent Medical Services you may have received from other than [facility] providers in the past year (date may be approximate).     Assessment:   This is a routine wellness examination for Lori Jones.  Hearing/Vision screen Hearing Screening - Comments:: Denies hearing difficulties   Vision Screening - Comments:: Wears rx glasses - up to date with routine eye exams with [facility]    Dietary issues and exercise activities discussed:     Goals Addressed             This Visit's Progress    COMPLETED:  Acknowledge receipt of Advanced Directive package       Packet given today and counseled on completing and return.      COMPLETED: HEMOGLOBIN A1C < 8       Last A1c-8.1 on 09/17/2021.      Remain active and independent        Depression Screen  10/11/2022    6:06 PM 06/06/2022    9:33 AM 04/24/2022    2:47 PM 03/15/2022    3:03 PM 02/15/2022    1:45 PM 01/25/2022    2:29 PM 12/24/2021    3:21 PM  PHQ 2/9 Scores  PHQ - 2 Score 0 0 0 0 0 0 0  PHQ- 9 Score  0 0 0 0 0 0    Fall Risk    10/11/2022    6:07 PM 05/03/2022   10:32 AM 02/12/2022   12:43 PM 02/05/2022    3:18 PM 01/25/2022     2:29 PM  Fall Risk   Falls in the past year? 0 0 1 0 0  Number falls in past yr: 0 0 0 0   Injury with Fall? 0 0 1 0   Risk for fall due to : No Fall Risks      Follow up Falls prevention discussed;Education provided;Falls evaluation completed Falls evaluation completed Falls evaluation completed      MEDICARE RISK AT HOME:  Medicare Risk at Home - 10/11/22 1807     Any stairs in or around the home? No    If so, are there any without handrails? No    Home free of loose throw rugs in walkways, pet beds, electrical cords, etc? Yes    Adequate lighting in your home to reduce risk of falls? Yes    Life alert? No    Use of a cane, walker or w/c? No    Grab bars in the bathroom? Yes    Shower chair or bench in shower? No    Elevated toilet seat or a handicapped toilet? No             TIMED UP AND GO:  Was the test performed?  No    Cognitive Function:    03/17/2018    9:36 AM  MMSE - Mini Mental State Exam  Orientation to time 5  Orientation to Place 5  Registration 3  Attention/ Calculation 5  Recall 3  Language- name 2 objects 2  Language- repeat 1  Language- follow 3 step command 3  Language- read & follow direction 1  Write a sentence 1  Copy design 1  Total score 30        10/11/2022    6:08 PM 11/13/2021    2:15 PM 09/13/2020    9:23 AM 03/17/2018    9:36 AM  6CIT Screen  What Year? 0 points 0 points 0 points 0 points  What month? 0 points 0 points 0 points 0 points  What time? 0 points 0 points 0 points 0 points  Count back from 20 0 points 0 points 0 points 0 points  Months in reverse 0 points 0 points 0 points 0 points  Repeat phrase 0 points 0 points 0 points 0 points  Total Score 0 points 0 points 0 points 0 points    Immunizations Immunization History  Administered Date(s) Administered   PFIZER Comirnaty(Gray Top)Covid-19 Tri-Sucrose Vaccine 09/13/2020   PFIZER(Purple Top)SARS-COV-2 Vaccination 06/24/2019, 07/19/2019, 02/14/2020   Pfizer  Covid-19 Vaccine Bivalent Booster 70yrs & up 04/30/2021   Tdap 06/22/2015    TDAP status: Up to date  Pneumococcal vaccine status: Up to date  Covid-19 vaccine status: Information provided on how to obtain vaccines.   Qualifies for Shingles Vaccine? Yes   Zostavax completed No   Shingrix Completed?: No.    Education has been provided regarding the importance of this  vaccine. Patient has been advised to call insurance company to determine out of pocket expense if they have not yet received this vaccine. Advised may also receive vaccine at local pharmacy or Health Dept. Verbalized acceptance and understanding.  Screening Tests Health Maintenance  Topic Date Due   Zoster Vaccines- Shingrix (1 of 2) Never done   COVID-19 Vaccine (7 - 2023-24 season) 11/30/2021   FOOT EXAM  02/08/2022   HEMOGLOBIN A1C  08/16/2022   Colonoscopy  12/18/2022   INFLUENZA VACCINE  10/31/2022   Diabetic kidney evaluation - eGFR measurement  02/16/2023   Diabetic kidney evaluation - Urine ACR  02/16/2023   OPHTHALMOLOGY EXAM  03/09/2023   Medicare Annual Wellness (AWV)  10/11/2023   PAP SMEAR-Modifier  02/19/2024   MAMMOGRAM  10/08/2024   DTaP/Tdap/Td (2 - Td or Tdap) 06/21/2025   Hepatitis C Screening  Completed   HIV Screening  Completed   HPV VACCINES  Aged Out    Health Maintenance  Health Maintenance Due  Topic Date Due   Zoster Vaccines- Shingrix (1 of 2) Never done   COVID-19 Vaccine (7 - 2023-24 season) 11/30/2021   FOOT EXAM  02/08/2022   HEMOGLOBIN A1C  08/16/2022   Colonoscopy  12/18/2022    Colorectal cancer screening: Type of screening: Colonoscopy. Completed 12/17/17. Repeat every 5 years  Mammogram status: Completed 10/09/22. Repeat every year  Lung Cancer Screening: (Low Dose CT Chest recommended if Age 31-80 years, 20 pack-year currently smoking OR have quit w/in 15years.) does not qualify.   Lung Cancer Screening Referral: n/a  Additional Screening:  Hepatitis C Screening:  does qualify; Completed 02/21/16  Vision Screening: Recommended annual ophthalmology exams for early detection of glaucoma and other disorders of the eye. Is the patient up to date with their annual eye exam?  Yes  Who is the provider or what is the name of the office in which the patient attends annual eye exams? [facility] If pt is not established with a provider, would they like to be referred to a provider to establish care? No .   Dental Screening: Recommended annual dental exams for proper oral hygiene  Diabetic Foot Exam: Diabetic Foot Exam: Overdue, Pt has been advised about the importance in completing this exam. Pt is scheduled for diabetic foot exam on at next office visit.  Community Resource Referral / Chronic Care Management: CRR required this visit?  No   CCM required this visit?  No     Plan:     I have personally reviewed and noted the following in the patient's chart:   Medical and social history Use of alcohol, tobacco or illicit drugs  Current medications and supplements including opioid prescriptions. Patient is not currently taking opioid prescriptions. Functional ability and status Nutritional status Physical activity Advanced directives List of other physicians Hospitalizations, surgeries, and ER visits in previous 12 months Vitals Screenings to include cognitive, depression, and falls Referrals and appointments  In addition, I have reviewed and discussed with patient certain preventive protocols, quality metrics, and best practice recommendations. A written personalized care plan for preventive services as well as general preventive health recommendations were provided to patient.     Kandis Fantasia [place], [place]   1/61/0960   After Visit Summary: (MyChart) Due to this being a telephonic visit, the after visit summary with patients personalized plan was offered to patient via MyChart   Nurse Notes: No concerns

## 2022-10-15 DIAGNOSIS — M5416 Radiculopathy, lumbar region: Secondary | ICD-10-CM | POA: Diagnosis not present

## 2022-10-21 DIAGNOSIS — M65312 Trigger thumb, left thumb: Secondary | ICD-10-CM | POA: Diagnosis not present

## 2022-11-02 DIAGNOSIS — M5416 Radiculopathy, lumbar region: Secondary | ICD-10-CM | POA: Diagnosis not present

## 2022-11-05 ENCOUNTER — Other Ambulatory Visit: Payer: Self-pay

## 2022-11-05 ENCOUNTER — Emergency Department (HOSPITAL_COMMUNITY)
Admission: EM | Admit: 2022-11-05 | Discharge: 2022-11-05 | Disposition: A | Discharge status: Home / Self Care | Payer: 59 | Attending: Emergency Medicine | Admitting: Emergency Medicine

## 2022-11-05 ENCOUNTER — Encounter (HOSPITAL_COMMUNITY): Payer: Self-pay | Admitting: Emergency Medicine

## 2022-11-05 DIAGNOSIS — Z7982 Long term (current) use of aspirin: Secondary | ICD-10-CM | POA: Insufficient documentation

## 2022-11-05 DIAGNOSIS — W228XXA Striking against or struck by other objects, initial encounter: Secondary | ICD-10-CM | POA: Diagnosis not present

## 2022-11-05 DIAGNOSIS — S0181XA Laceration without foreign body of other part of head, initial encounter: Secondary | ICD-10-CM | POA: Diagnosis not present

## 2022-11-05 DIAGNOSIS — S01412A Laceration without foreign body of left cheek and temporomandibular area, initial encounter: Secondary | ICD-10-CM | POA: Diagnosis not present

## 2022-11-05 DIAGNOSIS — Z23 Encounter for immunization: Secondary | ICD-10-CM | POA: Insufficient documentation

## 2022-11-05 MED ORDER — TETANUS-DIPHTH-ACELL PERTUSSIS 5-2.5-18.5 LF-MCG/0.5 IM SUSY
0.5000 mL | PREFILLED_SYRINGE | Freq: Once | INTRAMUSCULAR | Status: AC
  Administered 2022-11-05: 0.5 mL via INTRAMUSCULAR
  Filled 2022-11-05: qty 0.5

## 2022-11-05 NOTE — Discharge Instructions (Addendum)
Please use Tylenol or ibuprofen for pain.  You may use 600 mg ibuprofen every 6 hours or 1000 mg of Tylenol every 6 hours.  You may choose to alternate between the 2.  This would be most effective.  Not to exceed 4 g of Tylenol within 24 hours.  Not to exceed 3200 mg ibuprofen 24 hours.  

## 2022-11-05 NOTE — ED Provider Notes (Signed)
[place] EMERGENCY DEPARTMENT AT [facility] Provider Note   CSN: [id number] Arrival date & time: 11/05/22  1655     History  Chief Complaint  Patient presents with   Facial Laceration    Lori Jones is a 62 y.o. female with past medical history significant for schizoaffective disorder who reports her last tetanus shot was 2016 who presents for concern for laceration to the left cheek.  She reports that she was spitting up some close and the close line swung down and hit her in the face.  She was concerned because of the amount of bleeding that there was at the time.  She ended up bandaging and placing some Neosporin though.  She "just wanted to get checked out".  Denies loss of consciousness, confusion, vision changes.  HPI     Home Medications Prior to Admission medications   Medication Sig Start Date End Date Taking? Authorizing Provider  ACCU-CHEK GUIDE test strip USE UP TO 4 TIMES DAILY AS DIRECTED 11/29/21   Lockie Mola, MD  Accu-Chek Softclix Lancets lancets USE UP TO FOUR TIMES DAILY AS DIRECTED 09/28/21   Brimage, Seward Meth, DO  aspirin EC 81 MG tablet Take 1 tablet (81 mg total) by mouth daily. 05/09/17   Beaulah Dinning, MD  atorvastatin (LIPITOR) 40 MG tablet TAKE 1 TABLET(40 MG) BY MOUTH DAILY 10/10/22   Lockie Mola, MD  b complex vitamins capsule Take 1 capsule by mouth daily.    [provider]  baclofen (LIORESAL) 10 MG tablet Take 1 tablet (10 mg total) by mouth 3 (three) times daily. 02/05/22   Westley Chandler, MD  blood glucose meter kit and supplies KIT Dispense based on patient and insurance preference. Use up to four times daily as directed. 07/12/21   Brimage, Vondra, DO  ELDERBERRY PO Take by mouth.    [provider]  escitalopram (LEXAPRO) 10 MG tablet TAKE 1 TABLET(10 MG) BY MOUTH DAILY 07/30/21   Brimage, Vondra, DO  gabapentin (NEURONTIN) 100 MG capsule Take 1 capsule (100 mg total) by mouth 3 (three) times daily. 05/03/22    Drema Dallas, DO  levothyroxine (SYNTHROID) 25 MCG tablet TAKE 1 TABLET BY MOUTH DAILY 30 MINUTES BEFORE BREAKFAST 10/10/22   Lockie Mola, MD  metFORMIN (GLUCOPHAGE) 1000 MG tablet TAKE 1 TABLET(1000 MG) BY MOUTH TWICE DAILY WITH A MEAL 09/23/22   Lockie Mola, MD  Multiple Vitamins-Minerals (ZINC PO) Take by mouth.    [provider]  sitaGLIPtin (JANUVIA) 25 MG tablet TAKE 1 TABLET(25 MG) BY MOUTH DAILY AFTER BREAKFAST 08/13/22   Lockie Mola, MD  Turmeric (QC TUMERIC COMPLEX PO) Take by mouth.    [provider]  zinc sulfate 220 (50 Zn) MG capsule Take 220 mg by mouth daily.    [provider]      Allergies    Clarithromycin, Sumatriptan, Canagliflozin, Canagliflozin-metformin hcl, Chlorhexidine, and Sumatriptan succinate    Review of Systems   Review of Systems  All other systems reviewed and are negative.   Physical Exam Updated Vital Signs BP (!) 156/73 (BP Location: Right Arm)   Pulse 87   Temp 98.5 F (36.9 C)   Resp 18   SpO2 96%  Physical Exam Vitals and nursing note reviewed.  Constitutional:      General: She is not in acute distress.    Appearance: Normal appearance.  HENT:     Head: Normocephalic and atraumatic.  Eyes:     General:  Right eye: No discharge.        Left eye: No discharge.  Cardiovascular:     Rate and Rhythm: Normal rate and regular rhythm.  Pulmonary:     Effort: Pulmonary effort is normal. No respiratory distress.  Musculoskeletal:        General: No deformity.  Skin:    General: Skin is warm and dry.     Capillary Refill: Capillary refill takes less than 2 seconds.     Comments: Patient with approximately 1-1.5 centimeter laceration overlying the left cheek.  No evidence of secondary infection.  There is no significant dehiscence, foreign body noted.  The laceration is very shallow.  Neurological:     Mental Status: She is alert and oriented to person, place, and time.  Psychiatric:        Mood  and Affect: Mood normal.        Behavior: Behavior normal.     ED Results / Procedures / Treatments   Labs (all labs ordered are listed, but only abnormal results are displayed) Labs Reviewed - No data to display  EKG None  Radiology No results found.  Procedures Procedures    Medications Ordered in ED Medications  Tdap (BOOSTRIX) injection 0.5 mL (has no administration in time range)    ED Course/ Medical Decision Making/ A&P                                 Medical Decision Making  This is an overall well-appearing 61 year old female who presents with concern for laceration to left cheek.  Patient is with tetanus shot last in 2016, she denies any loss of consciousness.  On assessment patient with very shallow laceration overlying the left cheek.  She appears to be taking appropriate care, applying Neosporin, there is no dehiscence, appropriate developing granulation tissue noted.  No evidence of secondary infection.  Discussed with patient that I do not recommend any secondary closure.  We will update her tetanus.  Encouraged ongoing wound care and discussed return precautions for signs of developing infection. Final Clinical Impression(s) / ED Diagnoses Final diagnoses:  Facial laceration, initial encounter    Rx / DC Orders ED Discharge Orders     None         Olene Floss, PA-C 11/05/22 1816    Charlynne Pander, MD 11/05/22 2251

## 2022-11-05 NOTE — ED Triage Notes (Signed)
Pt reports holding the line of a clothes line down for her mother yesterday and the post fell over and hit her in her left check. Laceration noted.  Pt did not loss consciousness.  States she just wants to have it looked at.  Last tetanus 2016

## 2022-11-12 ENCOUNTER — Telehealth: Payer: Self-pay

## 2022-11-12 NOTE — Telephone Encounter (Signed)
Transition Care Management Follow-up Telephone Call Date of discharge and from where: Lori Jones 8/6 How have you been since you were released from the hospital? Doing real good Any questions or concerns? No  Items Reviewed: Did the pt receive and understand the discharge instructions provided? Yes Medications obtained and verified? No  Other? No  Any new allergies since your discharge? No  Dietary orders reviewed? No Do you have support at home? Yes   Follow up appointments reviewed:  PCP Hospital f/u appt confirmed? No  Scheduled to see  on  @ . Specialist Hospital f/u appt confirmed? No  Scheduled to see  on  @ . Are transportation arrangements needed? No  If their condition worsens, is the pt aware to call PCP or go to the Emergency Dept.? Yes Was the patient provided with contact information for the PCP's office or ED? Yes Was to pt encouraged to call back with questions or concerns? Yes

## 2022-11-13 ENCOUNTER — Other Ambulatory Visit: Payer: Self-pay | Admitting: Family Medicine

## 2022-11-26 DIAGNOSIS — M79672 Pain in left foot: Secondary | ICD-10-CM | POA: Diagnosis not present

## 2022-11-27 DIAGNOSIS — M67962 Unspecified disorder of synovium and tendon, left lower leg: Secondary | ICD-10-CM | POA: Diagnosis not present

## 2022-12-02 NOTE — Progress Notes (Unsigned)
  SUBJECTIVE:   CHIEF COMPLAINT / HPI:   Foot pain, falling Not on any blood thinners ***  PERTINENT  PMH / PSH: ***  Past Medical History:  Diagnosis Date   Allergy    Anemia    Anginal pain (HCC)    hx of CPs   Anxiety    Arthritis    Ascending aorta dilatation (HCC)    38mm by echo 08/2020   Asthma    hx of in childhood    Back pain, chronic    Bursitis    Depression    Diabetes mellitus    Foot fracture, right    Heart murmur    Herniated disc    Hypercholesteremia    Hypertension    no meds   Hypothyroid    Kidney disease    "pelvic kidney" bilat    Scoliosis    Seizures (HCC)    hx of in childhood and again in college    Stroke [facility])    hx of sun stroke while in college     OBJECTIVE:  There were no vitals taken for this visit.  General: NAD, pleasant, able to participate in exam Cardiac: RRR, no murmurs auscultated Respiratory: CTAB, normal WOB Abdomen: soft, non-tender, non-distended, normoactive bowel sounds Extremities: warm and well perfused, no edema or cyanosis Skin: warm and dry, no rashes noted Neuro: alert, no obvious focal deficits, speech normal Psych: Normal affect and mood  ASSESSMENT/PLAN:   There are no diagnoses linked to this encounter. No orders of the defined types were placed in this encounter.  No follow-ups on file.  Vonna Drafts, MD [facility]

## 2022-12-03 ENCOUNTER — Ambulatory Visit: Payer: 59 | Admitting: Family Medicine

## 2022-12-03 ENCOUNTER — Encounter: Payer: Self-pay | Admitting: Family Medicine

## 2022-12-03 DIAGNOSIS — E119 Type 2 diabetes mellitus without complications: Secondary | ICD-10-CM | POA: Diagnosis not present

## 2022-12-03 DIAGNOSIS — E782 Mixed hyperlipidemia: Secondary | ICD-10-CM | POA: Diagnosis not present

## 2022-12-03 DIAGNOSIS — K219 Gastro-esophageal reflux disease without esophagitis: Secondary | ICD-10-CM | POA: Diagnosis not present

## 2022-12-03 DIAGNOSIS — Z1211 Encounter for screening for malignant neoplasm of colon: Secondary | ICD-10-CM | POA: Diagnosis not present

## 2022-12-03 DIAGNOSIS — E039 Hypothyroidism, unspecified: Secondary | ICD-10-CM | POA: Diagnosis not present

## 2022-12-03 LAB — POCT GLYCOSYLATED HEMOGLOBIN (HGB A1C): HbA1c, POC (controlled diabetic range): 10.1 % — AB (ref 0.0–7.0)

## 2022-12-03 NOTE — Patient Instructions (Addendum)
Please come to see Dr. Raymondo Band this Friday at 11am  Please continue current medications until then

## 2022-12-04 LAB — BASIC METABOLIC PANEL
BUN/Creatinine Ratio: 9 — ABNORMAL LOW (ref 12–28)
BUN: 7 mg/dL — ABNORMAL LOW (ref 8–27)
CO2: 21 mmol/L (ref 20–29)
Calcium: 9.7 mg/dL (ref 8.7–10.3)
Chloride: 106 mmol/L (ref 96–106)
Creatinine, Ser: 0.81 mg/dL (ref 0.57–1.00)
Glucose: 163 mg/dL — ABNORMAL HIGH (ref 70–99)
Potassium: 4.1 mmol/L (ref 3.5–5.2)
Sodium: 144 mmol/L (ref 134–144)
eGFR: 82 mL/min/{1.73_m2} (ref >59)

## 2022-12-05 ENCOUNTER — Encounter: Payer: Self-pay | Admitting: Family Medicine

## 2022-12-05 DIAGNOSIS — R300 Dysuria: Secondary | ICD-10-CM | POA: Diagnosis not present

## 2022-12-05 DIAGNOSIS — R030 Elevated blood-pressure reading, without diagnosis of hypertension: Secondary | ICD-10-CM | POA: Diagnosis not present

## 2022-12-06 ENCOUNTER — Encounter: Payer: Self-pay | Admitting: Pharmacist

## 2022-12-06 ENCOUNTER — Ambulatory Visit (INDEPENDENT_AMBULATORY_CARE_PROVIDER_SITE_OTHER): Payer: 59 | Admitting: Pharmacist

## 2022-12-06 VITALS — BP 136/78 | HR 75 | Ht 68.5 in | Wt 190.8 lb

## 2022-12-06 DIAGNOSIS — F419 Anxiety disorder, unspecified: Secondary | ICD-10-CM | POA: Diagnosis not present

## 2022-12-06 DIAGNOSIS — F32A Depression, unspecified: Secondary | ICD-10-CM | POA: Diagnosis not present

## 2022-12-06 DIAGNOSIS — E119 Type 2 diabetes mellitus without complications: Secondary | ICD-10-CM | POA: Diagnosis not present

## 2022-12-06 MED ORDER — ESCITALOPRAM OXALATE 10 MG PO TABS: 10.0000 mg | ORAL_TABLET | Freq: Every day | ORAL | 3 refills | Status: DC

## 2022-12-06 MED ORDER — SITAGLIPTIN PHOSPHATE 100 MG PO TABS: 100.0000 mg | ORAL_TABLET | Freq: Every day | ORAL | 3 refills | Status: DC

## 2022-12-06 NOTE — Progress Notes (Signed)
Reviewed and agree with Dr Koval's plan.   

## 2022-12-06 NOTE — Assessment & Plan Note (Signed)
Diabetes longstanding currently uncontrolled with last A1c of 10.1 in the context of patient receiving steroid injections in the back, and additional stressor related to foot, and hand issues. Patient initially  apprehensive to changes with medication regimen and reports reluctance to starting insulin. Patient is able to verbalize appropriate hypoglycemia management plan. Medication adherence appears good.  - Continued metformin 1000 mg twice daily.   - Increased Januvia (sitagliptin) from 25 mg to 100 mg once daily.  eGFR = 82 - Instructed patient to check glucose readings a couple times a week at home and bring them to the next visit. -Patient educated on purpose, proper use, and potential adverse effects.  -Extensively discussed pathophysiology of diabetes, recommended lifestyle interventions, dietary effects on blood sugar control.  -Patient interested in "dietary/nutrition visit" - agreed to forward request to Dr. Gerilyn Pilgrim.

## 2022-12-06 NOTE — Assessment & Plan Note (Signed)
History of Lexapro (escitalopram) use with history of anxiety/depression.  Refill requested by patient.  -Refill provided and planned follow-up visit with Dr. Marsh Dolly (PCP) in 6 weeks.

## 2022-12-06 NOTE — Patient Instructions (Addendum)
It was nice to see you today!  Your goal blood sugar is 80-130 before eating and less than 180 after eating.  Medication Changes:  Increase Januvia (sitagliptin) to 25 mg 4 tablets once daily. Once supply on hand is finished, pick up Januvia (sitagliptin) 100 mg once daily from pharmacy.  We have sent a script for Lexapro (escitalopram) to your pharmacy.  Monitor blood sugars at home and keep a log (glucometer or piece of paper) to bring with you to your next visit.  Continue all other medication the same.   Keep up the good work with diet and exercise. Aim for a diet full of vegetables, fruit and lean meats (chicken, [place], fish). Try to limit salt intake by eating fresh or frozen vegetables (instead of canned), rinse canned vegetables prior to cooking and do not add any additional salt to meals.

## 2022-12-06 NOTE — Progress Notes (Signed)
S:     Chief Complaint  Patient presents with   Medication Management    Diabetes   62 y.o. female who presents for diabetes evaluation, education, and management. Patient arrives in good spirits and presents without any assistance. Patient has a "walking boot" on lower left leg.   Patient was referred and last seen by Primary Care Provider, Dr. Barb Merino, on 12/03/22. She has seen Dr. Marsh Dolly in 2023.   PMH is significant for T2DM, HDL, and HTN.   At last visit with Dr. Barb Merino, no changes made to medication regimen. Patient reports she was unhappy with her last visit and states that she "wasn't listened to". Due to this, she remains apprehensive concerning the need for any adjustment to her diabetes medication regimen.  Patient reports Diabetes was diagnosed longstanding ~17 years.  Family History: mother passed from pancreatic cancer in 2007 when patient was diagnosed with diabetes  Current diabetes medications include: metformin 1000 mg twice daily, Januvia (sitagliptin) 25 mg once daily Current hyperlipidemia medications include: atorvastatin 40 mg daily  Patient reports adherence to taking almost all medications as prescribed. Requests refill of previously prescribed Lexapro (escitalopram) which she has not take recently.   Do you feel that your medications are working for you? yes Have you been experiencing any side effects to the medications prescribed? no Insurance coverage: [employer] Medicare (Dual Complete)  Reported home blood sugars: Patient reports back/foot doctor told her not to check blood sugar until the inflammation in her foot has subsided long after receiving steroid epidural shots  Patient reported dietary habits: Eats 2-3 meals/day Snacks: candy  Patient-reported exercise habits: minimal exercise recently as activity is limited by the boot.  Reports walking 3 miles a day before the boot was placed   O:   Review of Systems  All other systems reviewed and are  negative.   Physical Exam Constitutional:      Appearance: Normal appearance.  Neurological:     Mental Status: She is alert.  Psychiatric:        Mood and Affect: Mood normal.        Behavior: Behavior normal.        Thought Content: Thought content normal.        Judgment: Judgment normal.     Lab Results  Component Value Date   HGBA1C 10.1 (A) 12/03/2022   Vitals:   12/06/22 1151  BP: 136/78  Pulse: 75  SpO2: 100%    Lipid Panel     Component Value Date/Time   CHOL 117 07/12/2021 1620   TRIG 107 07/12/2021 1620   HDL 36 (L) 07/12/2021 1620   CHOLHDL 3.3 07/12/2021 1620   CHOLHDL 2.9 10/05/2016 0233   VLDL 10 10/05/2016 0233   LDLCALC 61 07/12/2021 1620    Clinical Atherosclerotic Cardiovascular Disease (ASCVD): Yes  Known ascending aorta dilatation  A/P: Diabetes longstanding currently uncontrolled with last A1c of 10.1 in the context of patient receiving steroid injections in the back, and additional stressor related to foot, and hand issues. Patient initially  apprehensive to changes with medication regimen and reports reluctance to starting insulin. Patient is able to verbalize appropriate hypoglycemia management plan. Medication adherence appears good.  - Continued metformin 1000 mg twice daily.   - Increased Januvia (sitagliptin) from 25 mg to 100 mg once daily.  eGFR = 82 - Instructed patient to check glucose readings a couple times a week at home and bring them to the next visit. -Patient educated on  purpose, proper use, and potential adverse effects.  -Extensively discussed pathophysiology of diabetes, recommended lifestyle interventions, dietary effects on blood sugar control.  -Patient interested in "dietary/nutrition visit" - agreed to forward request to Dr. Gerilyn Pilgrim.  ASCVD risk - secondary prevention in patient with diabetes. Last LDL: 61mg /dL.  -Continued atorvastatin 40 mg once daily.  Hypertension longstanding close to controlled with today's  office reading of 136/78 mm Hg. Blood pressure goal of <130/80 mmHg.  - Currently not on any BP medications.  History of Lexapro (escitalopram) use with history of anxiety/depression.  Refill requested by patient.  -Refill provided and planned follow-up visit with Dr. Marsh Dolly (PCP) in 6 weeks.   Written patient instructions provided. Patient verbalized understanding of treatment plan.  Total time in face to face counseling 48 minutes.    Follow-up:  Pharmacist visit with Dr. Raymondo Band on 02/04/23 PCP clinic visit with Dr. Marsh Dolly on 01/20/23 Patient seen with Alesia Banda, PharmD Candidate and Andee Poles, PharmD Candidate.

## 2022-12-25 DIAGNOSIS — M67962 Unspecified disorder of synovium and tendon, left lower leg: Secondary | ICD-10-CM | POA: Diagnosis not present

## 2022-12-31 ENCOUNTER — Encounter: Payer: Self-pay | Admitting: Family Medicine

## 2022-12-31 ENCOUNTER — Ambulatory Visit (INDEPENDENT_AMBULATORY_CARE_PROVIDER_SITE_OTHER): Payer: 59 | Admitting: Family Medicine

## 2022-12-31 VITALS — Ht 68.5 in | Wt 190.4 lb

## 2022-12-31 DIAGNOSIS — E119 Type 2 diabetes mellitus without complications: Secondary | ICD-10-CM

## 2022-12-31 DIAGNOSIS — E663 Overweight: Secondary | ICD-10-CM | POA: Diagnosis not present

## 2022-12-31 NOTE — Patient Instructions (Addendum)
Check your blood glucose first thing in the morning (fasting) at least 5 days a week.  Record this on the form provided today.    Continue to use your pil organizer for your Januvia and other med's.  If you need to take it with you, use a pill container, which could be as simple as the prescription drug bottle.  (No more storing in a Kleenex!)   Diet Recommendations for Diabetes  Carbohydrate includes starch, sugar, and fiber.  Of these, only sugar and starch raise blood glucose.  (Fiber is found in fruits, vegetables [especially skin, seeds, and stalks], whole grains, and beans.)   Starchy (carb) foods: Bread, rice, pasta, potatoes, sweet potatoes, corn, cereal, grits, crackers, bagels, muffins, all baked goods.  (Fruit, milk, and yogurt also have carbohydrate, but most of these foods will not spike your blood sugar as most starchy or sweet foods will.)  A few fruits do cause high blood sugars; use small portions of bananas (limit to 1/2 at a time), grapes, watermelon, and oranges.   Protein foods: Meat, fish, poultry, eggs, dairy foods, and beans such as pinto and kidney beans (beans also provide carbohydrate).   1. Eat at least 3 REAL meals and 1-2 snacks per day.  Eat breakfast within one hour of getting up.  Have something to eat at least every 5 hours while awake.  - A REAL breakfast needs to include both starch and protein foods.  (Fruit is optional.) - A REAL meal for lunch or dinner includes at least some protein, some starch, and vegetables and/or fruit.     2. Limit starchy foods to TWO portions per meal and ONE per snack. ONE portion of a starchy food is equal to the following:  - ONE slice of bread (or its equivalent, such as half of a hamburger bun).  - 1/2 cup of a "scoopable" starchy food such as potatoes or rice.  - 15 grams of Total Carbohydrate as shown on food label.  - 4 ounces of a sweet drink (including fruit juice).  (Juice is generally NOT recommended; it is a concentrated  source of calories and carbohydrate.)  For example, for breakfast, have oatmeal, but instead of adding bread to that, have 2 eggs to get less overall carb and more protein.   3. Include vegetables in at least 8 meals per week.  - Fresh or frozen vegetables are best.  - Keep frozen vegetables on hand for a quick option.       Follow-up appt on Monday, Oct 28 at 11 AM.

## 2022-12-31 NOTE — Progress Notes (Signed)
Medical Nutrition Therapy Appt start time: 1100 end time: 1200 (1 hour) Primary concerns today: Weight management and Blood sugar control.   Relevant history/background: Lori Jones  was referred by Paulino Rily, PharmD for MNT related to diabetes.  Other diagnoses include HLD, obesity, hypothyroidism, anxiety, and depression.  She was seen by me for MNT in 2017.  (Goals then included [1] Veg with both lunch and dinner; [2] <2 starches/meal; and [3] walk 60 min 5 X wk and cycle 30 min 2 X wk.)  She had achieved an A1c of 5.8 in April 2017, but A1c on 12/03/22 was 10.1.  DM meds include metformin 1000 mg twice daily and Januvia (sitagliptin) 100 mg once daily.     Assessment:  Bentli admits that her eating (snacking especially) has been excessive in carbohydrates, and she has experienced a lot of stress related to medical issues and living situation, which she feels has also affected BG levels.  In addition, she has sometimes lost her small Jenuvia pills, so has missed dosages.    Learning Readiness: Ready   Weight: 190.4 lb; (wt on 12/06/22 was 190.8 lb; ht is 68.5".)   Recent FBGs: 150's to 160's 2-3 X wk; ~100 2-3 X wk.   Usual eating pattern: 3 meals and no snacks per day, although eating was erratic when travelling a lot during June - August. Frequent foods and beverages: water, 1% milk daily; instant flavored oatmeal, PB&J sandwich, eggs, bread, brown rice, beans, Jiffyy corn bread.   Avoided foods: soda, beef, pork.   Usual physical activity: 40-60 min walking 6 X wk; usually averages 09-7998 steps/day. Sleep: Estimates ~9 hours/night.  Bedtime ~8 AM, and up at 6:30 AM.  Food security -  Within the past 12 months, did you run out or worry that you would run out of food, and not have money to get more?  No.    24-hr recall: (Up at 6:30 AM; drank water) B (6:45 AM)-   1 pkt  instant flavored oatmeal, 4 oz 1% milk, 1 boiled egg, 1 toast, <1 tsp jelly, water Snk ( AM)-   --- L ( PM)-  --- Snk (  PM)-  --- D (8:30 PM)-  1 c brown rice, 1 c red beans, 1 small bbq chx leg, 1/2 chx neck bone, 1 pc (1/8 recipe) Jiffy corn bread, water Snk ( PM)-  --- Typical day? No.  Missed mid-day meal; was out doing errands.  Nutritional Diagnosis:  NI-5.8.2 Excessive carbohydrate intake As related to snacks.  As evidenced by self-report of usual intake of daily carb-based snacks prior to recently.  Handouts given during visit include: After-Visit Summary (AVS) FBG log  Vegetables A to Z handout  Demonstrated degree of understanding via:  Teach Back  Barriers to learning/adherence to lifestyle change: Lives alone; has longstanding food habits not conducive to glycemic control.    For behavioral goals and recommendations, see Patient Instructions.    Monitoring/Evaluation:  Dietary intake, exercise, FBG, and body weight in 4 week(s).

## 2023-01-08 IMAGING — CT CT ANGIO CHEST
2 of 7 series · 19 of 46 positions shown · IV contrast (APPLIED)
Comparison: None

CLINICAL DATA: Elevated D-dimer, history asthma, hypertension,
stroke

EXAM:
CT ANGIOGRAPHY CHEST WITH CONTRAST
TECHNIQUE: Multidetector CT imaging of the chest was performed using the
standard protocol during bolus administration of intravenous
contrast. Multiplanar CT image reconstructions and MIPs were
obtained to evaluate the vascular anatomy.
CONTRAST:  80mL OMNIPAQUE IOHEXOL 350 MG/ML SOLN IV

[Series 9: thins · axial · 0.82mm/px · z∈[+1318,+1567]mm · 16 of 401 slices shown]
[im 23/401  lung]
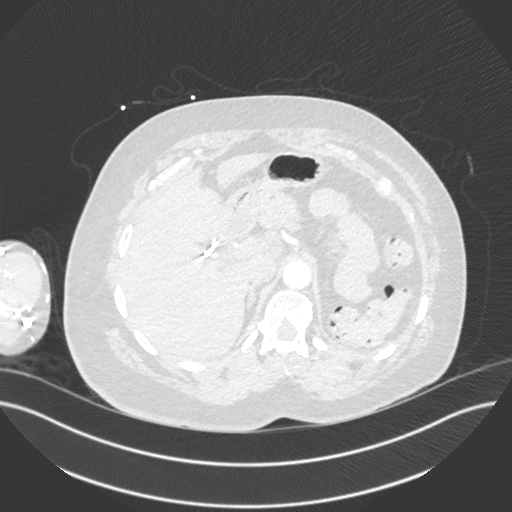
[im 45/401  soft-tissue]
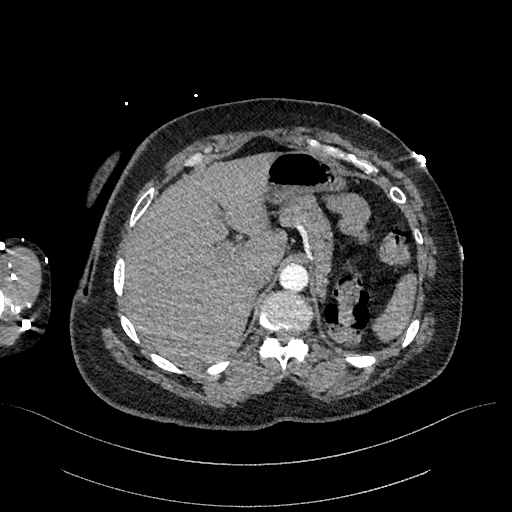
[im 67/401  lung]
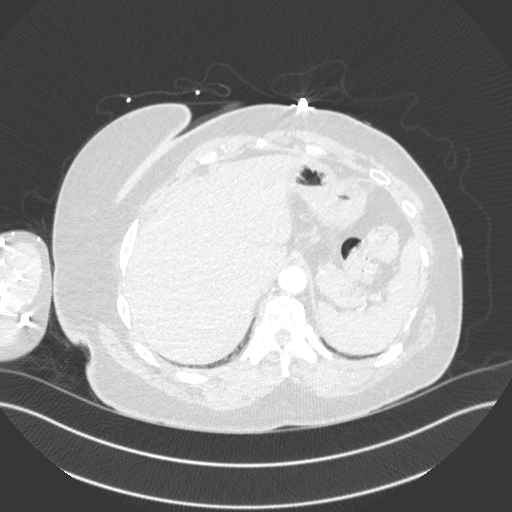
[im 89/401  soft-tissue]
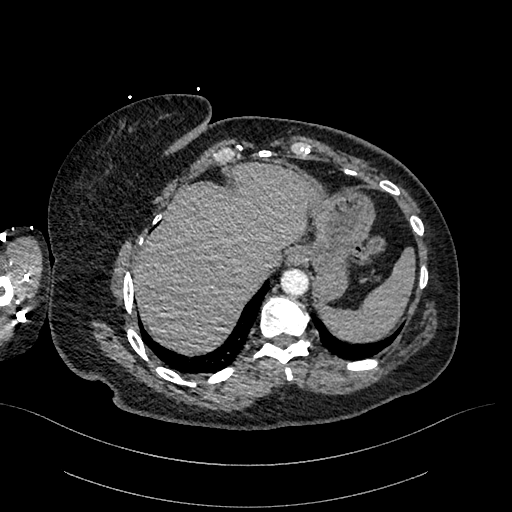
[im 112/401  lung]
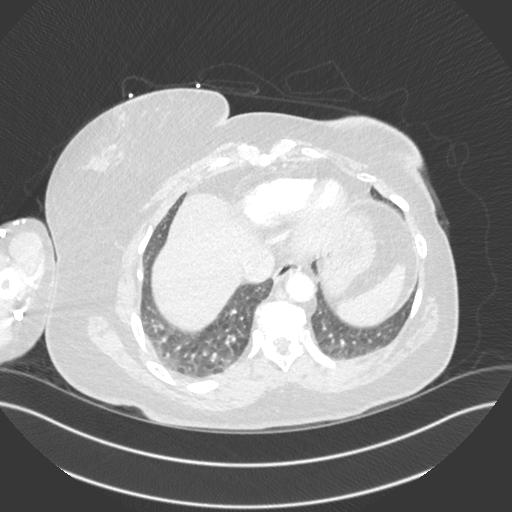
[im 134/401  soft-tissue]
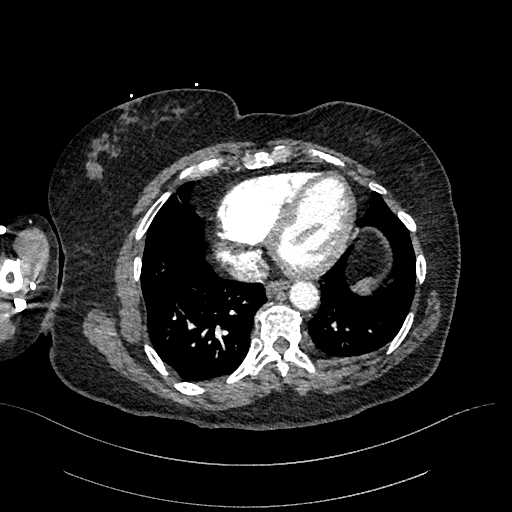
[im 156/401  lung]
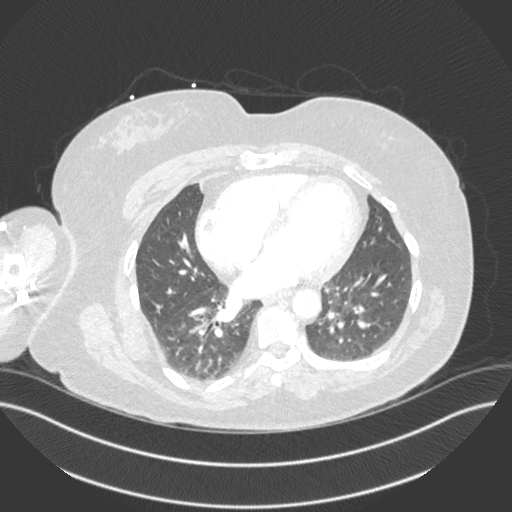
[im 178/401  soft-tissue]
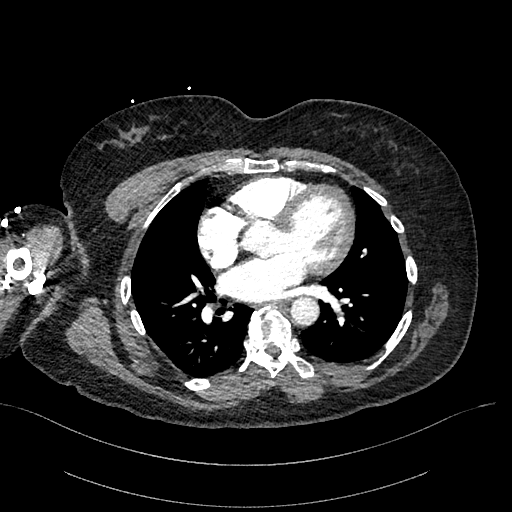
[im 223/401  lung]
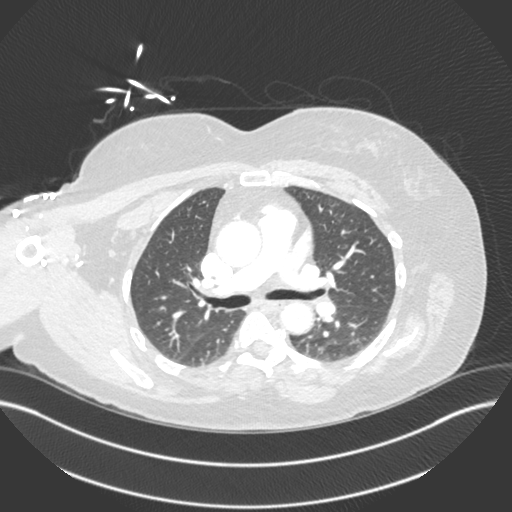
[im 245/401  soft-tissue]
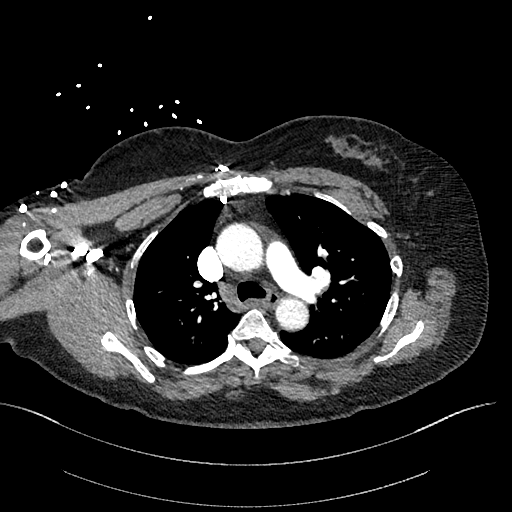
[im 267/401  lung]
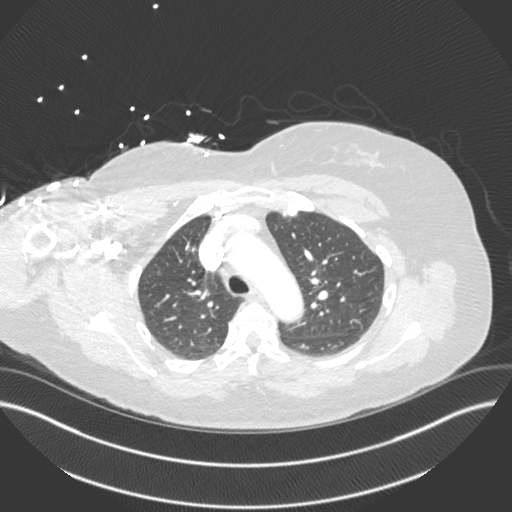
[im 289/401  soft-tissue]
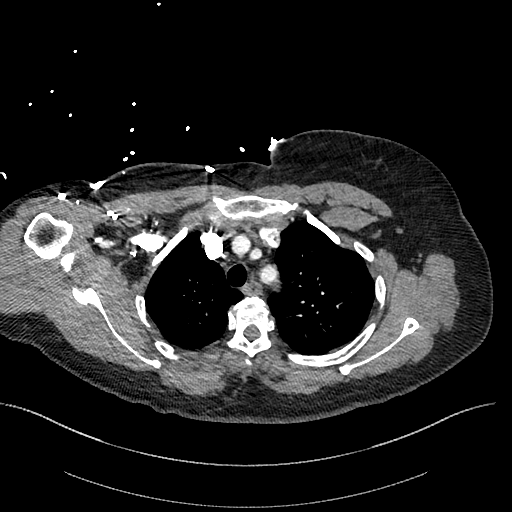
[im 312/401  lung]
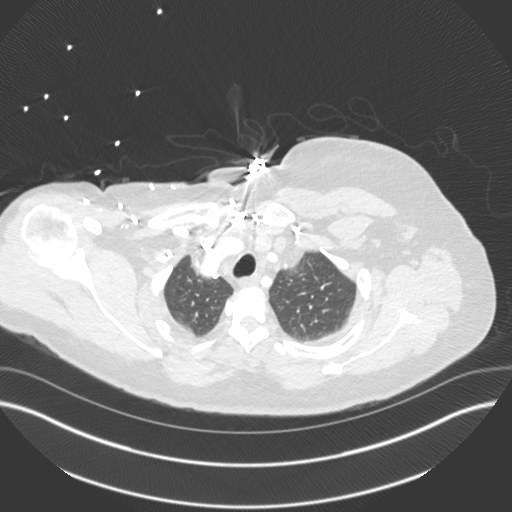
[im 334/401  soft-tissue]
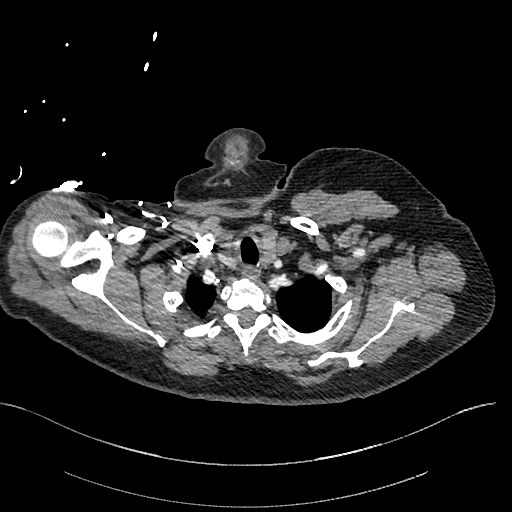
[im 356/401  lung]
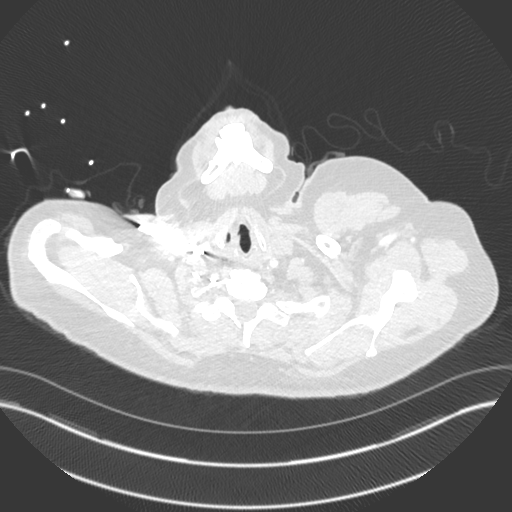
[im 378/401  soft-tissue]
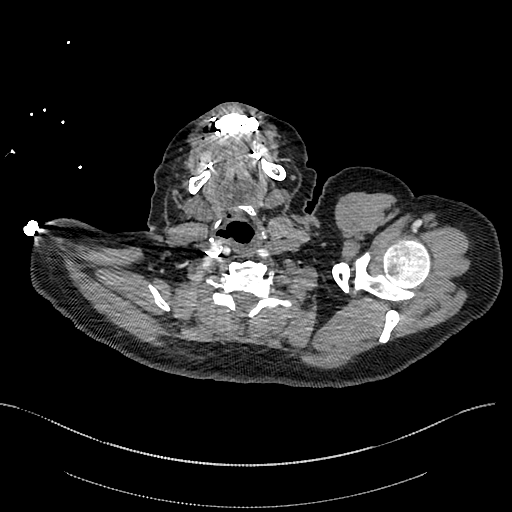

[Series 10: cor · coronal · 0.56mm/px · 3 of 143 slices shown]
[im 36/143  soft-tissue]
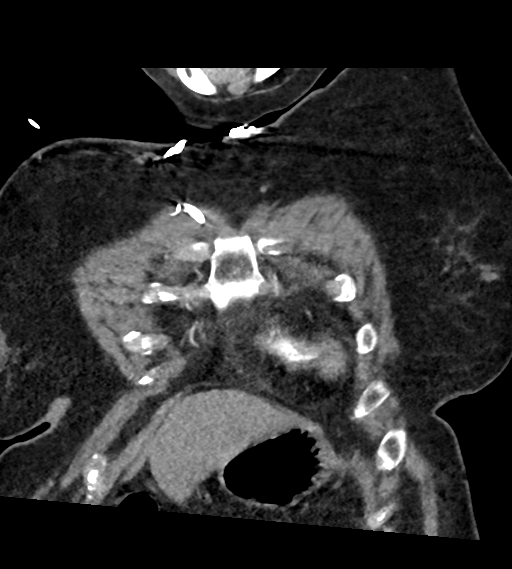
[im 72/143  soft-tissue]
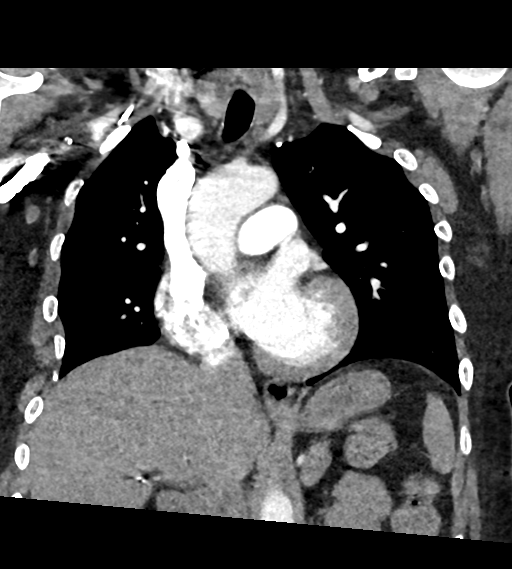
[im 107/143  soft-tissue]
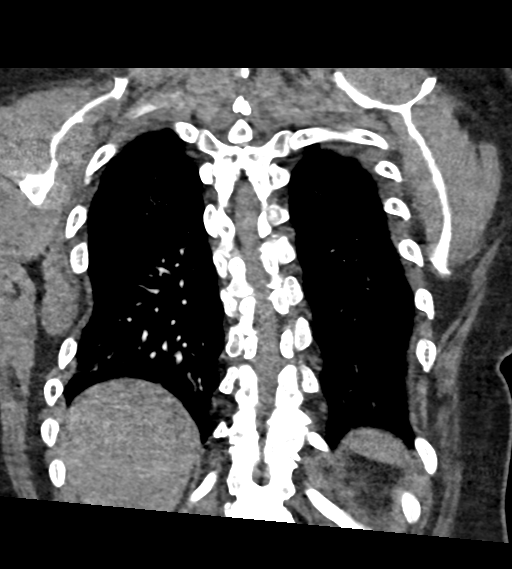

[19 of 46 positions shown; findings below may reference images not displayed]

FINDINGS: Cardiovascular: Ascending thoracic aorta upper normal caliber. No
aneurysm or dissection. Scattered coronary arterial calcifications.
Mild dilatation of cardiac chambers. Pulmonary arteries adequately
opacified and patent. No evidence of pulmonary embolism.

Mediastinum/Nodes: Esophagus unremarkable. Base of cervical region
normal appearance. No thoracic adenopathy.

Lungs/Pleura: Minimal dependent atelectasis in the lower lobes.

Upper Abdomen: Post cholecystectomy. Remaining visualized upper
abdomen unremarkable

Musculoskeletal: No acute osseous findings. Thoracolumbar scoliosis,
dominant levoconvex.

Review of the MIP images confirms the above findings.
IMPRESSION: No evidence of pulmonary embolism.

Scattered coronary arterial calcifications.

Minimal dependent atelectasis in the lower lobes.

## 2023-01-13 ENCOUNTER — Emergency Department (HOSPITAL_COMMUNITY)
Admission: EM | Admit: 2023-01-13 | Discharge: 2023-01-13 | Discharge status: Against Medical Advice | Payer: 59 | Attending: Emergency Medicine | Admitting: Emergency Medicine

## 2023-01-13 ENCOUNTER — Other Ambulatory Visit: Payer: Self-pay

## 2023-01-13 ENCOUNTER — Telehealth: Payer: Self-pay | Admitting: Pharmacist

## 2023-01-13 DIAGNOSIS — X501XXA Overexertion from prolonged static or awkward postures, initial encounter: Secondary | ICD-10-CM | POA: Diagnosis not present

## 2023-01-13 DIAGNOSIS — R0789 Other chest pain: Secondary | ICD-10-CM | POA: Diagnosis not present

## 2023-01-13 DIAGNOSIS — Z5321 Procedure and treatment not carried out due to patient leaving prior to being seen by health care provider: Secondary | ICD-10-CM | POA: Diagnosis not present

## 2023-01-13 LAB — CBC WITH DIFFERENTIAL/PLATELET
Abs Immature Granulocytes: 0 10*3/uL (ref 0.00–0.07)
Basophils Absolute: 0 10*3/uL (ref 0.0–0.1)
Basophils Relative: 0 %
Eosinophils Absolute: 0.1 10*3/uL (ref 0.0–0.5)
Eosinophils Relative: 2 %
HCT: 38.8 % (ref 36.0–46.0)
Hemoglobin: 12.1 g/dL (ref 12.0–15.0)
Immature Granulocytes: 0 %
Lymphocytes Relative: 54 %
Lymphs Abs: 3.1 10*3/uL (ref 0.7–4.0)
MCH: 26.4 pg (ref 26.0–34.0)
MCHC: 31.2 g/dL (ref 30.0–36.0)
MCV: 84.5 fL (ref 80.0–100.0)
Monocytes Absolute: 0.4 10*3/uL (ref 0.1–1.0)
Monocytes Relative: 7 %
Neutro Abs: 2.1 10*3/uL (ref 1.7–7.7)
Neutrophils Relative %: 37 %
Platelets: 229 10*3/uL (ref 150–400)
RBC: 4.59 MIL/uL (ref 3.87–5.11)
RDW: 12.7 % (ref 11.5–15.5)
WBC: 5.7 10*3/uL (ref 4.0–10.5)
nRBC: 0 % (ref 0.0–0.2)

## 2023-01-13 LAB — BASIC METABOLIC PANEL
Anion gap: 10 (ref 5–15)
BUN: 12 mg/dL (ref 8–23)
CO2: 22 mmol/L (ref 22–32)
Calcium: 9.2 mg/dL (ref 8.9–10.3)
Chloride: 108 mmol/L (ref 98–111)
Creatinine, Ser: 0.83 mg/dL (ref 0.44–1.00)
GFR, Estimated: >60 mL/min (ref >60)
Glucose, Bld: 140 mg/dL — ABNORMAL HIGH (ref 70–99)
Potassium: 4.2 mmol/L (ref 3.5–5.1)
Sodium: 140 mmol/L (ref 135–145)

## 2023-01-13 LAB — TROPONIN I (HIGH SENSITIVITY): Troponin I (High Sensitivity): 4 ng/L (ref <18)

## 2023-01-13 NOTE — ED Provider Triage Note (Signed)
Emergency Medicine Provider Triage Evaluation Note  AMARAH BROSSMAN , a 62 y.o. female  was evaluated in triage.  Pt complains of intermittent left-sided and central chest pain.  Patient states her symptoms began on Saturday after bending down to pick something up off the floor.  She reports that the chest pain was sudden and "scared her".  Denies shortness of breath, nausea, vomiting.  Denies any known cardiac history.  She is currently asymptomatic.  Review of Systems  Positive: As above Negative: As above  Physical Exam  BP (!) 143/74 (BP Location: Right Arm)   Pulse 71   Temp 98.3 F (36.8 C) (Oral)   Resp 16   SpO2 100%  Gen:   Awake, no distress   Resp:  Normal effort  MSK:   Moves extremities without difficulty  Other:    Medical Decision Making  Medically screening exam initiated at 5:19 PM.  Appropriate orders placed.  CANNIE MUCKLE was informed that the remainder of the evaluation will be completed by another provider, this initial triage assessment does not replace that evaluation, and the importance of remaining in the ED until their evaluation is complete.     Melton Alar R, PA-C 01/13/23 1720

## 2023-01-13 NOTE — ED Triage Notes (Signed)
Pt to ED POV from home. Pt c/o sharp central / left sided CP intermittently since Saturday after bending down to pick something up off the floor. Pt denies SOB. Pt denies nausea. Pt denies any cardiac hx.

## 2023-01-13 NOTE — ED Notes (Signed)
Pt left the ED

## 2023-01-13 NOTE — Telephone Encounter (Signed)
Patient contacted for possible enrollment in [employer] study. Has appointment on 11/05 and will have discussion then.   Total time with patient call and documentation of interaction: 7 minutes.

## 2023-01-14 NOTE — Telephone Encounter (Signed)
Reviewed and agree with Dr Koval's plan.   

## 2023-01-20 ENCOUNTER — Ambulatory Visit: Payer: 59 | Admitting: Family Medicine

## 2023-01-20 VITALS — BP 122/66 | HR 72 | Ht 68.0 in | Wt 190.1 lb

## 2023-01-20 DIAGNOSIS — M79672 Pain in left foot: Secondary | ICD-10-CM | POA: Diagnosis not present

## 2023-01-20 DIAGNOSIS — E119 Type 2 diabetes mellitus without complications: Secondary | ICD-10-CM | POA: Diagnosis not present

## 2023-01-20 DIAGNOSIS — M7672 Peroneal tendinitis, left leg: Secondary | ICD-10-CM | POA: Diagnosis not present

## 2023-01-20 NOTE — Progress Notes (Signed)
    SUBJECTIVE:   CHIEF COMPLAINT / HPI:   Diabetes  Patient saw Dr. Raymondo Band on 12/06/2022. At that visit increased Januvia from 25 mg to 100 mg once daily, continued metformin 1000 mg BID. Patient said she has not had any side effects with elevated januvia dose. Patient says that she has been on a regular schedule for meal timings and that this has helped her.  She says she thinks her A1c is higher now because she has been getting epidural steroid injections for her back pain and scoliosis. Additionally she has not been as active because she has had a boot on.  Her BG logs have been 94-137 post prandially 1-3 hours.  Has been more interested in the CGM sensor.   PERTINENT  PMH / PSH: T2DM, Hypothyroidism, Scoliosis, GERD, Schizoaffective   OBJECTIVE:   BP 122/66   Pulse 72   Ht 5\' 8"  (1.727 m)   Wt 190 lb 2 oz (86.2 kg)   SpO2 97%   BMI 28.91 kg/m   General: well appearing, in no acute distress CV: RRR, radial pulses equal and palpable, no BLE edema  Resp: Normal work of breathing on room air, CTAB Abd: Soft, non tender, non distended  Neuro: Alert & Oriented x 4  Foot:   - L ankle with brace on.   -   ASSESSMENT/PLAN:   Assessment & Plan Diabetes mellitus without complication (HCC) Glucose readings have improved with Januvia. Patient had many readings, but unorganized by time and how long after meals.  - Continue januvia, metformin  - Patient declined GLP-1 at this time, can consider that in the future.  - Get CGM placed during appt with Dr. Raymondo Band on 11/5  - Patient is going to start water aerobics.   Due for Pap smear (scheduled with her gynecologist)  colonoscopy (last in 2019 and recommended in another 5 years) (Has appointment) Last echo showed aortic dilatation of 38 mm in 2022, due for another echocardiogram next year  Recommended shingles, covid, and flu vaccines - declined at this visit, will get soon when she has had time to eat before visit.    Lockie Mola,  MD [facility]

## 2023-01-20 NOTE — Assessment & Plan Note (Signed)
Glucose readings have improved with Januvia. Patient had many readings, but unorganized by time and how long after meals.  - Continue januvia, metformin  - Patient declined GLP-1 at this time, can consider that in the future.  - Get CGM placed during appt with Dr. Raymondo Band on 11/5  - Patient is going to start water aerobics.

## 2023-01-20 NOTE — Patient Instructions (Addendum)
It was wonderful to see you today.  Please bring ALL of your medications with you to every visit.   Today we talked about:  Diabetes -I am glad that your blood glucose levels have been looking a lot better.  I think that you would really benefit from the CGM.  That is the device that goes on your arm to help track your blood glucose.  This way we can also keep track of them and see how we can better help you. I do really recommend the weekly injection one of them goes by the name of Ozempic.  This would help to avoid having to start insulin.  2 improved your blood glucose I also think daily exercise would help I am glad that you have joined the gym.  Water aerobics would work well as it would be easier on your foot/ankle and also allow you to be active.  These make sure that you get your Pap smear completed and go to the appointment for your colonoscopy.  Additionally please get your shingles, flu and COVID vaccines.  Please follow up in 2 months   Thank you for choosing [facility].   Please call [phone] with any questions about today's appointment.  Please be sure to schedule follow up at the front desk before you leave today.   Lockie Mola, MD  Family Medicine

## 2023-01-23 ENCOUNTER — Other Ambulatory Visit (HOSPITAL_COMMUNITY): Payer: Self-pay | Admitting: Student

## 2023-01-23 DIAGNOSIS — M5416 Radiculopathy, lumbar region: Secondary | ICD-10-CM

## 2023-01-24 DIAGNOSIS — M67962 Unspecified disorder of synovium and tendon, left lower leg: Secondary | ICD-10-CM | POA: Diagnosis not present

## 2023-01-27 ENCOUNTER — Ambulatory Visit: Payer: 59 | Admitting: Family Medicine

## 2023-01-27 VITALS — Ht 68.5 in | Wt 188.4 lb

## 2023-01-27 DIAGNOSIS — M79672 Pain in left foot: Secondary | ICD-10-CM | POA: Diagnosis not present

## 2023-01-27 DIAGNOSIS — E663 Overweight: Secondary | ICD-10-CM | POA: Diagnosis not present

## 2023-01-27 DIAGNOSIS — E119 Type 2 diabetes mellitus without complications: Secondary | ICD-10-CM

## 2023-01-27 DIAGNOSIS — Z7984 Long term (current) use of oral hypoglycemic drugs: Secondary | ICD-10-CM

## 2023-01-27 DIAGNOSIS — M7672 Peroneal tendinitis, left leg: Secondary | ICD-10-CM | POA: Diagnosis not present

## 2023-01-27 NOTE — Progress Notes (Signed)
Medical Nutrition Therapy Appt start time: 1100 end time: 1200 (1 hour) Primary concerns today: Weight management and Blood sugar control.   Relevant history/background: Lori Jones  was referred by Paulino Rily, PharmD for MNT related to diabetes.  Other diagnoses include HLD, obesity, hypothyroidism, anxiety, and depression.  She was seen by me for MNT in 2017.  (Goals then included [1] Veg with both lunch and dinner; [2] <2 starches/meal; and [3] walk 60 min 5 X wk and cycle 30 min 2 X wk.)  She had achieved an A1c of 5.8 in April 2017, but A1c on 12/03/22 was 10.1.  DM meds include metformin 1000 mg twice daily and Januvia (sitagliptin) 100 mg once daily.     Assessment:  [name] has been dealing with landlord problems, which has been very stressful.  She has misplaced her blood glucose log as well as her glucometer.  Has consistently eaten 3 meals/day, but not usually actually tracking carb intake.      Learning Readiness: Ready   Weight: 188.4 lb; (wt on 12/31/22 190.4 lb; ht is 68.5".)   Recent FBGs: 150's to 170's.  (Could not find her meter today.) Usual eating pattern: 3 meals and no snacks per day.  Recent physical activity: 40-60 min walking 6 X wk; usually averages 09-7998(+) steps/day (fewer on Mondays). Sleep: Estimates ~9 hours/night.  Bedtime ~8:30 PM, and up at 5:45 AM.  Food security -  Within the past 12 months, did you run out or worry that you would run out of food, and not have money to get more?  No.    24-hr recall:  (Up at 5 AM; drank water) B (8:45 AM)-  1 pkt instant flavored oatmeal, 1/4 c 1% milk, 1 slc toast, 1 tsp jelly  Snk ( AM)-  water L (12 PM)-  3" chx sub (let, pickles, chs, mayo), 8 oz regular Gatorade Snk (1 PM)-  ~2 c popcorn with walnuts (like Cracker Jacks), 8 oz regular Gatorade D (6:30 PM)-  1 PB&J (2 tbsp+ PB, 1 1/2 tsp jelly), water Snk ( PM)-  water Typical day? No.   Usually has sandwich for lunch, and more for dinner.    Nutritional Diagnosis:  Limited progress on NI-5.8.2 Excessive carbohydrate intake As related to snacks.  As evidenced by reduced snacking in general, but choices often still carb-heavy.     Handouts given during visit include: After-Visit Summary (AVS) FBG log  Goals Sheet  Demonstrated degree of understanding via:  Teach Back  Barriers to learning/adherence to lifestyle change: Lives alone; has longstanding food habits not conducive to glycemic control.    For behavioral goals and recommendations, see Patient Instructions.    Monitoring/Evaluation:  Dietary intake, exercise, FBG, and body weight in 4 week(s).

## 2023-01-27 NOTE — Patient Instructions (Signed)
Timing of eating:  For most of [place], our insulin works best earlier in the day, and becomes less effective (sensitive) as we get closer to bedtime.  For this reason, it makes good sense to eat most of your carbohydrate early in the day instead of near bedtime, and it is probably best to eat larger meals earlier in the day.  Your decision about what to eat when needs to also consider what works in your schedule and what you find satisfying for meals.  NOTE:  What we find satisfying is changeable as we get accustomed to certain types of food and meals and to certain eating schedules.    Controlling you blood sugars:   - Liquid calories (sweet drinks) most effectively raise blood sugars.  (Eat your fruit; don't drink it.)  If you MUST drink something sweet, mix it half-and-half with seltzer or club soda.   - Beans are a great source of protein, including for people who need to control their blood sugar.    Diet Recommendations for Diabetes  Carbohydrate includes starch, sugar, and fiber.  Of these, only sugar and starch raise blood glucose.  (Fiber is found in fruits, vegetables [especially skin, seeds, and stalks], whole grains, and beans.)   Starchy (carb) foods: Bread, rice, pasta, potatoes, sweet potatoes, corn, cereal, grits, crackers, bagels, muffins, all baked goods.  (Fruit, milk, and yogurt also have carbohydrate, but most of these foods will not spike your blood sugar as most starchy or sweet foods will.)  A few fruits do cause high blood sugars; use small portions of bananas (limit to 1/2 at a time), grapes, watermelon, and oranges.   Protein foods: Meat, fish, poultry, eggs, dairy foods, and beans such as pinto and kidney beans (beans also provide carbohydrate).   1. Eat at least 3 REAL meals at least 1 snack per day.  Eat breakfast within one hour of getting up.  Have something to eat at least every 5 hours while awake.  - A REAL breakfast needs to include both starch and protein foods.  (Fruit  is optional.) - A REAL meal for lunch or dinner includes at least some protein, some starch, and vegetables and/or fruit.   - Good snacks include unsalted nuts, up to 1 ounce of cheese, whole-grain crackers, any fresh fruit, most yogurts (look for least amount of sugar), 1/2 sandwich, 1 cheese toast, peanut butter on celery or on crackers.    2. Limit starchy foods to TWO portions per meal and ONE per snack. ONE portion of a starchy food is equal to the following:  - ONE slice of bread (or its equivalent, such as half of a hamburger bun).  - 1/2 cup of a "scoopable" starchy food such as potatoes or rice.  - 15 grams of Total Carbohydrate as shown on food label.  - 4 ounces of a sweet drink (including fruit juice).  (Juice is generally NOT recommended; it is a concentrated source of calories and carbohydrate.)  3. Include vegetables in at least 8 meals per week.  - Fresh or frozen vegetables are best.  - Keep frozen vegetables on hand for a quick option.   Document progress on the above goals using the Goals Tracker provided today.   Also write down your fasting blood glucose levels on the Fasting Blood Glucose provided today.    Bring your Goals Tracker and your fasting blood sugar log to your follow-up appt on Monday, Nov 25 at 11 AM.

## 2023-01-29 ENCOUNTER — Ambulatory Visit (HOSPITAL_COMMUNITY)
Admission: RE | Admit: 2023-01-29 | Discharge: 2023-01-29 | Disposition: A | Discharge status: Home / Self Care | Payer: 59 | Source: Ambulatory Visit | Attending: Student | Admitting: Student

## 2023-01-29 DIAGNOSIS — M4187 Other forms of scoliosis, lumbosacral region: Secondary | ICD-10-CM | POA: Diagnosis not present

## 2023-01-29 DIAGNOSIS — M51379 Other intervertebral disc degeneration, lumbosacral region without mention of lumbar back pain or lower extremity pain: Secondary | ICD-10-CM | POA: Diagnosis not present

## 2023-01-29 DIAGNOSIS — M5416 Radiculopathy, lumbar region: Secondary | ICD-10-CM | POA: Diagnosis not present

## 2023-01-29 DIAGNOSIS — M48061 Spinal stenosis, lumbar region without neurogenic claudication: Secondary | ICD-10-CM | POA: Diagnosis not present

## 2023-01-29 DIAGNOSIS — M47816 Spondylosis without myelopathy or radiculopathy, lumbar region: Secondary | ICD-10-CM | POA: Diagnosis not present

## 2023-02-04 ENCOUNTER — Encounter: Payer: Self-pay | Admitting: Pharmacist

## 2023-02-04 ENCOUNTER — Ambulatory Visit (INDEPENDENT_AMBULATORY_CARE_PROVIDER_SITE_OTHER): Payer: 59 | Admitting: Pharmacist

## 2023-02-04 VITALS — BP 135/75 | HR 70 | Wt 189.0 lb

## 2023-02-04 DIAGNOSIS — E119 Type 2 diabetes mellitus without complications: Secondary | ICD-10-CM

## 2023-02-04 DIAGNOSIS — M79672 Pain in left foot: Secondary | ICD-10-CM | POA: Diagnosis not present

## 2023-02-04 DIAGNOSIS — E7800 Pure hypercholesterolemia, unspecified: Secondary | ICD-10-CM

## 2023-02-04 DIAGNOSIS — M7672 Peroneal tendinitis, left leg: Secondary | ICD-10-CM | POA: Diagnosis not present

## 2023-02-04 LAB — POCT GLYCOSYLATED HEMOGLOBIN (HGB A1C): HbA1c, POC (controlled diabetic range): 7.9 % — AB (ref 0.0–7.0)

## 2023-02-04 NOTE — Assessment & Plan Note (Signed)
Diabetes longstanding currently uncontrolled, but trending towards better control. Patient is able to verbalize appropriate hypoglycemia management plan. Medication adherence appears good, increased adherence with new pill box. Control is suboptimal due to medication adherence issues. POC A1c excluded Patient from enrollment in LIBERATE study as current A1c was 7.9  -Continued metformin 1000 mg BID -Continued sitagliptin (Januvia) 100 mg once daily -Patient educated on purpose, proper use, and potential adverse effects. -Extensively discussed pathophysiology of diabetes, recommended lifestyle interventions, dietary effects on blood sugar control.  -Counseled on s/sx of and management of hypoglycemia.

## 2023-02-04 NOTE — Assessment & Plan Note (Signed)
ASCVD risk - patient with diabetes. Last LDL is at goal of <70  mg/dL.  - Continued atorvastatin 40 mg once daily

## 2023-02-04 NOTE — Patient Instructions (Addendum)
It was nice to see you today!  Your goal blood sugar is 80-130 before eating and less than 180 after eating.  Medication Changes: CONTINUE metformin 1000 mg twice daily, sitagliptin (Januvia) 100 mg once daily  Continue all other medication the same.   Monitor blood sugars at home and keep a log (glucometer or piece of paper) to bring with you to your next visit.  Keep up the good work with diet and exercise. Aim for a diet full of vegetables, fruit and lean meats (chicken, [place], fish). Try to limit salt intake by eating fresh or frozen vegetables (instead of canned), rinse canned vegetables prior to cooking and do not add any additional salt to meals.

## 2023-02-04 NOTE — Progress Notes (Signed)
S:     Chief Complaint  Patient presents with   Medication Management    Diabetes -    62 y.o. female who presents for diabetes evaluation, education, and management in the context of the LIBERATE Study potential enrollment.   Patient was last seen by Primary Care Provider, Dr. Marsh Dolly, on 01/20/2023.   PMH is significant for T2DM, HLD.  At last visit, patient declined GLP-1 RA, discussed starting CGM.   Patient reports Diabetes was diagnosed in 2015.   Family/Social History: no family hx of Dialysis  Current diabetes medications include: metformin 1000 mg BID, sitagliptin (Januvia) 100 mg once daily Current hypertension medications include: None Current hyperlipidemia medications include: atorvastatin 40 mg once daily  Patient reports adherence to taking all medications as prescribed. Reports that she is much better about taking her sitagliptin (Januvia) now that she has a small pillbox.   Insurance coverage: [employer], [place] Medicaid  Reported home fasting blood sugars: 166 this AM Reported 2 hour post-meal/random blood sugars: ~high 90s/low 100s, reports numbers after dinner (largest meal) are higher.  Patient reported dietary habits: Working with Therisa Doyne Nutritionist at [facility] Lunch: Trying to switch lunch to her largest meal, rather than dinner  O:   Review of Systems  All other systems reviewed and are negative.   Physical Exam Vitals reviewed.  Constitutional:      Appearance: Normal appearance.  Pulmonary:     Effort: Pulmonary effort is normal.  Neurological:     Mental Status: She is alert.  Psychiatric:        Mood and Affect: Mood normal.        Behavior: Behavior normal.        Thought Content: Thought content normal.        Judgment: Judgment normal.    Lab Results  Component Value Date   HGBA1C 7.9 (A) 02/04/2023   Vitals:   02/04/23 1045 02/04/23 1057  BP: 136/72 135/75  Pulse: 70   SpO2: 99%     Lipid Panel      Component Value Date/Time   CHOL 117 07/12/2021 1620   TRIG 107 07/12/2021 1620   HDL 36 (L) 07/12/2021 1620   CHOLHDL 3.3 07/12/2021 1620   CHOLHDL 2.9 10/05/2016 0233   VLDL 10 10/05/2016 0233   LDLCALC 61 07/12/2021 1620    Clinical Atherosclerotic Cardiovascular Disease (ASCVD): Yes  The ASCVD Risk score (Arnett DK, et al., 2019) failed to calculate for the following reasons:   The patient has a prior MI or stroke diagnosis   Patient is participating in a Managed Medicaid Plan:  Yes   A/P: Diabetes longstanding currently uncontrolled, but trending towards better control. Patient is able to verbalize appropriate hypoglycemia management plan. Medication adherence appears good, increased adherence with new pill box. Control is suboptimal due to medication adherence issues. POC A1c excluded Patient from enrollment in LIBERATE study as current A1c was 7.9  -Continued metformin 1000 mg BID -Continued sitagliptin (Januvia) 100 mg once daily -Patient educated on purpose, proper use, and potential adverse effects. -Extensively discussed pathophysiology of diabetes, recommended lifestyle interventions, dietary effects on blood sugar control.  -Counseled on s/sx of and management of hypoglycemia.   ASCVD risk - secondary prevention in patient with diabetes. Last LDL is at goal of <70  mg/dL.  - Continued atorvastatin 40 mg once daily  Elevated BP in office with two SBP ~135  Blood pressure goal of <130/80 mmHg. No BP medications currently. Blood  pressure control is suboptimal due to in office BP and no BP medications. - Discussed potential start of ARB, patient not willing to start yet, due to being nervous in office and believes out of office readings are lower - Discussed future AMB BP monitor - patient verbalized understanding of potential use in the future.   Written patient instructions provided. Patient verbalized understanding of treatment plan.  Total time in face to face  counseling 27 minutes.    Follow-up:  Pharmacist PRN PCP clinic visit in 03/19/2023 w/ Dr. Marsh Dolly Patient seen with Lendon Ka, PharmD Candidate and Shona Simpson, PharmD Candidate.

## 2023-02-05 NOTE — Progress Notes (Signed)
Reviewed and agree with Dr Koval's plan.   

## 2023-02-10 DIAGNOSIS — M7672 Peroneal tendinitis, left leg: Secondary | ICD-10-CM | POA: Diagnosis not present

## 2023-02-10 DIAGNOSIS — M79672 Pain in left foot: Secondary | ICD-10-CM | POA: Diagnosis not present

## 2023-02-11 ENCOUNTER — Other Ambulatory Visit: Payer: Self-pay

## 2023-02-11 ENCOUNTER — Emergency Department (HOSPITAL_COMMUNITY)
Admission: EM | Admit: 2023-02-11 | Discharge: 2023-02-11 | Disposition: A | Discharge status: Home / Self Care | Payer: 59 | Attending: Emergency Medicine | Admitting: Emergency Medicine

## 2023-02-11 ENCOUNTER — Emergency Department (HOSPITAL_COMMUNITY): Payer: 59

## 2023-02-11 ENCOUNTER — Encounter (HOSPITAL_COMMUNITY): Payer: Self-pay | Admitting: Emergency Medicine

## 2023-02-11 DIAGNOSIS — R079 Chest pain, unspecified: Secondary | ICD-10-CM | POA: Diagnosis not present

## 2023-02-11 DIAGNOSIS — Z7984 Long term (current) use of oral hypoglycemic drugs: Secondary | ICD-10-CM | POA: Insufficient documentation

## 2023-02-11 DIAGNOSIS — Z7982 Long term (current) use of aspirin: Secondary | ICD-10-CM | POA: Insufficient documentation

## 2023-02-11 DIAGNOSIS — E119 Type 2 diabetes mellitus without complications: Secondary | ICD-10-CM | POA: Diagnosis not present

## 2023-02-11 DIAGNOSIS — R0789 Other chest pain: Secondary | ICD-10-CM | POA: Diagnosis not present

## 2023-02-11 LAB — CBC
HCT: 41.1 % (ref 36.0–46.0)
Hemoglobin: 13.1 g/dL (ref 12.0–15.0)
MCH: 26.8 pg (ref 26.0–34.0)
MCHC: 31.9 g/dL (ref 30.0–36.0)
MCV: 84 fL (ref 80.0–100.0)
Platelets: 238 10*3/uL (ref 150–400)
RBC: 4.89 MIL/uL (ref 3.87–5.11)
RDW: 12.7 % (ref 11.5–15.5)
WBC: 4.5 10*3/uL (ref 4.0–10.5)
nRBC: 0 % (ref 0.0–0.2)

## 2023-02-11 LAB — BASIC METABOLIC PANEL
Anion gap: 10 (ref 5–15)
BUN: 9 mg/dL (ref 8–23)
CO2: 22 mmol/L (ref 22–32)
Calcium: 9.1 mg/dL (ref 8.9–10.3)
Chloride: 106 mmol/L (ref 98–111)
Creatinine, Ser: 0.78 mg/dL (ref 0.44–1.00)
GFR, Estimated: >60 mL/min (ref >60)
Glucose, Bld: 235 mg/dL — ABNORMAL HIGH (ref 70–99)
Potassium: 4.1 mmol/L (ref 3.5–5.1)
Sodium: 138 mmol/L (ref 135–145)

## 2023-02-11 LAB — TROPONIN I (HIGH SENSITIVITY)
Troponin I (High Sensitivity): 3 ng/L (ref <18)
Troponin I (High Sensitivity): 3 ng/L (ref <18)

## 2023-02-11 MED ORDER — ALUM & MAG HYDROXIDE-SIMETH 200-200-20 MG/5ML PO SUSP
15.0000 mL | Freq: Once | ORAL | Status: AC
  Administered 2023-02-11: 15 mL via ORAL
  Filled 2023-02-11: qty 30

## 2023-02-11 MED ORDER — KETOROLAC TROMETHAMINE 15 MG/ML IJ SOLN
15.0000 mg | Freq: Once | INTRAMUSCULAR | Status: AC
  Administered 2023-02-11: 15 mg via INTRAVENOUS
  Filled 2023-02-11: qty 1

## 2023-02-11 MED ORDER — ACETAMINOPHEN 500 MG PO TABS
1000.0000 mg | ORAL_TABLET | Freq: Once | ORAL | Status: AC
  Administered 2023-02-11: 1000 mg via ORAL
  Filled 2023-02-11: qty 2

## 2023-02-11 MED ORDER — FAMOTIDINE 20 MG PO TABS
20.0000 mg | ORAL_TABLET | Freq: Once | ORAL | Status: AC
  Administered 2023-02-11: 20 mg via ORAL
  Filled 2023-02-11: qty 1

## 2023-02-11 NOTE — ED Notes (Signed)
Patient transported to CT 

## 2023-02-11 NOTE — ED Notes (Signed)
Pt ambulated to restroom. 

## 2023-02-11 NOTE — ED Provider Notes (Signed)
EMERGENCY DEPARTMENT AT [facility] Provider Note   CSN: [id number] Arrival date & time: 02/11/23  0845     History  Chief Complaint  Patient presents with   Chest Pain   HPI Lori Jones is a 62 y.o. female with history of diabetes, hyperlipidemia and ascending aortic dilation presenting for chest pain started on Tuesday of last week.  Located in the left chest radiating to the back intermittently.  Feels like a dull pain.  States that it is worse with movement and is reproducible.  It is not exertional.  No associated shortness of breath.  No cough and fever.  States it is worse when she lays on her right side while sleeping.  Denies calf tenderness, recent immobilization and OCP use.   Chest Pain      Home Medications Prior to Admission medications   Medication Sig Start Date End Date Taking? Authorizing Provider  aspirin EC 81 MG tablet Take 1 tablet (81 mg total) by mouth daily. 05/09/17  Yes Beaulah Dinning, MD  atorvastatin (LIPITOR) 40 MG tablet TAKE 1 TABLET(40 MG) BY MOUTH DAILY Patient taking differently: Take 40 mg by mouth daily. 10/10/22  Yes Lockie Mola, MD  b complex vitamins capsule Take 1 capsule by mouth daily.   Yes [provider]  ELDERBERRY PO Take by mouth.   Yes [provider]  escitalopram (LEXAPRO) 10 MG tablet Take 1 tablet (10 mg total) by mouth daily. 12/06/22  Yes McDiarmid, Leighton Roach, MD  gabapentin (NEURONTIN) 100 MG capsule Take 1 capsule (100 mg total) by mouth 3 (three) times daily. 05/03/22  Yes Jaffe, Adam R, DO  Ginger, Zingiber officinalis, (GINGER ROOT) 500 MG CAPS Take 500 mg by mouth daily.   Yes [provider]  levothyroxine (SYNTHROID) 25 MCG tablet TAKE 1 TABLET BY MOUTH DAILY 30 MINUTES BEFORE BREAKFAST 10/10/22  Yes Lockie Mola, MD  metFORMIN (GLUCOPHAGE) 1000 MG tablet TAKE 1 TABLET(1000 MG) BY MOUTH TWICE DAILY WITH A MEAL Patient taking differently: Take 1,000 mg by mouth 2  (two) times daily with a meal. 09/23/22  Yes Lockie Mola, MD  Multiple Vitamins-Minerals (ZINC PO) Take by mouth.   Yes [provider]  sitaGLIPtin (JANUVIA) 100 MG tablet Take 1 tablet (100 mg total) by mouth daily. 12/06/22  Yes McDiarmid, Leighton Roach, MD  Turmeric (QC TUMERIC COMPLEX PO) Take by mouth.   Yes [provider]  zinc sulfate 220 (50 Zn) MG capsule Take 220 mg by mouth daily.   Yes [provider]  ACCU-CHEK GUIDE test strip USE UP TO 4 TIMES DAILY AS DIRECTED 11/29/21   Lockie Mola, MD  Accu-Chek Softclix Lancets lancets USE UP TO FOUR TIMES DAILY AS DIRECTED 09/28/21   Brimage, Seward Meth, DO  blood glucose meter kit and supplies KIT Dispense based on patient and insurance preference. Use up to four times daily as directed. 07/12/21   Katha Cabal, DO      Allergies    Clarithromycin, Sumatriptan, Canagliflozin, Canagliflozin-metformin hcl, Chlorhexidine, and Sumatriptan succinate    Review of Systems   Review of Systems  Cardiovascular:  Positive for chest pain.    Physical Exam   Vitals:   02/11/23 1230 02/11/23 1300  BP: (!) 165/108 (!) 164/81  Pulse: 76 70  Resp: (!) 23   Temp:    SpO2: 92% 97%    CONSTITUTIONAL:  Well-appearing, NAD NEURO:  Alert and oriented x 3, CN 3-12 grossly intact EYES:  eyes  equal and reactive ENT/NECK:  Supple, no stridor  Chest: Upper left chest wall tenderness with palpation CARDIO:  regular rate and rhythm, appears well-perfused  PULM:  No respiratory distress, CTAB GI/GU:  non-distended, soft MSK/SPINE:  No gross deformities, no edema, moves all extremities  SKIN:  no rash, atraumatic  *Additional and/or pertinent findings included in MDM below   ED Results / Procedures / Treatments   Labs (all labs ordered are listed, but only abnormal results are displayed) Labs Reviewed  BASIC METABOLIC PANEL - Abnormal; Notable for the following components:      Result Value   Glucose, Bld 235 (*)    All  other components within normal limits  CBC  TROPONIN I (HIGH SENSITIVITY)  TROPONIN I (HIGH SENSITIVITY)    EKG EKG Interpretation Date/Time:  Tuesday February 11 2023 08:56:37 EST Ventricular Rate:  74 PR Interval:  127 QRS Duration:  94 QT Interval:  380 QTC Calculation: 422 R Axis:   20  Text Interpretation: Sinus rhythm Low voltage, precordial leads Abnormal R-wave progression, early transition LVH by voltage Confirmed by Tanda Rockers ([id number]) on 02/11/2023 9:16:25 AM  Radiology DG Chest Portable 1 View  Result Date: 02/11/2023 CLINICAL DATA:  Chest pain. EXAM: PORTABLE CHEST 1 VIEW COMPARISON:  08/07/2020. FINDINGS: Bilateral lung fields are clear. Bilateral costophrenic angles are clear. Normal cardio-mediastinal silhouette. No acute osseous abnormalities. The soft tissues are within normal limits. IMPRESSION: *No active disease. Electronically Signed   By: Jules Schick M.D.   On: 02/11/2023 13:16    Procedures Procedures    Medications Ordered in ED Medications  acetaminophen (TYLENOL) tablet 1,000 mg (1,000 mg Oral Given 02/11/23 0941)  famotidine (PEPCID) tablet 20 mg (20 mg Oral Given 02/11/23 0941)  alum & mag hydroxide-simeth (MAALOX/MYLANTA) 200-200-20 MG/5ML suspension 15 mL (15 mLs Oral Given 02/11/23 1323)  ketorolac (TORADOL) 15 MG/ML injection 15 mg (15 mg Intravenous Given 02/11/23 1323)    ED Course/ Medical Decision Making/ A&P                                 Medical Decision Making Amount and/or Complexity of Data Reviewed Labs: ordered. Radiology: ordered.  Risk OTC drugs. Prescription drug management.   Initial Impression and Ddx 62 yo well appearing female present for chest pain.  Exam notable for left chest wall tenderness.  DDx includes ACS, PE, pneumonia, MSK, reflux, aortic dissection, other. Patient PMH that increases complexity of ED encounter:  history of diabetes, hyperlipidemia and ascending aortic dilation   Interpretation of  Diagnostics - I independent reviewed and interpreted the labs as followed: hyperglycemia  - I independently visualized the following imaging with scope of interpretation limited to determining acute life threatening conditions related to emergency care: cxr, which revealed no acute findings  -I personally reviewed and interpreted EKG which revealed sinus rhythm  Patient Reassessment and Ultimate Disposition/Management On serial reassessments patient remained well and stated chest pain improved with treatment.  Workup was overall reassuring and does not suggest ACS or PE.  Also considered dissection given her history of aortic root dilation but unlikely given nature of the chest pain, the fact this been going on since Tuesday and improved with treatment here, also blood pressure and heart rate reassuring.  Suspect overall this is MSK pain.  Advised supportive treatment at home.  Advised her to follow-up with her PCP.  Patient management required discussion with the following services or  consulting groups:  None  Complexity of Problems Addressed Acute complicated illness or Injury  Additional Data Reviewed and Analyzed Further history obtained from: Past medical history and medications listed in the EMR and Prior ED visit notes  Patient Encounter Risk Assessment None         Final Clinical Impression(s) / ED Diagnoses Final diagnoses:  Atypical chest pain    Rx / DC Orders ED Discharge Orders     None         Gareth Eagle, PA-C 02/11/23 1342    Sloan Leiter, DO 02/12/23 [id number]

## 2023-02-11 NOTE — Discharge Instructions (Signed)
Evaluation for chest pain was overall reassuring.  Recommend you follow-up with your PCP.  If your chest pain returns, you have shortness of breath, calf tenderness, or any other concerning symptom please return emergency department further evaluation.

## 2023-02-11 NOTE — ED Triage Notes (Signed)
Pt presents with CP, symptoms started last Tuesday.

## 2023-02-14 ENCOUNTER — Ambulatory Visit: Payer: 59 | Admitting: Student

## 2023-02-14 VITALS — BP 132/79 | HR 83 | Ht 67.5 in | Wt 190.6 lb

## 2023-02-14 DIAGNOSIS — R0789 Other chest pain: Secondary | ICD-10-CM

## 2023-02-14 MED ORDER — DICLOFENAC SODIUM 1 % EX GEL: 2.0000 g | Freq: Four times a day (QID) | CUTANEOUS | 1 refills | Status: DC | PRN

## 2023-02-14 NOTE — Progress Notes (Signed)
    SUBJECTIVE:   CHIEF COMPLAINT / HPI:   ED Follow Up: --Seen on 02/11/2023 for chest pain - Reassured by EKG at that time, was given Toradol for pain control -CBC, BMP, trops overall unremarkable  -Chest x-ray negative at that visit as well  Patient reports that she was praying and crying a couple of weeks ago and felt the sudden onset of her central chest pain.  The patient reports that the pain worsens with movement and is worse at night when she is changing sides to sleep.  She also notes that it is painful to palpation and pleuritic in nature.  Patient feels it is related to her muscles.  Patient denies shortness of breath, fever, chills.  Patient denies unilateral swelling of the lower extremity or pain.  PERTINENT  PMH / PSH: Ascending aortic dilation, GERD, diabetes, hypothyroidism, pelvic kidney, anxiety and depression, hyperlipidemia, chronic back pain, schizoaffective disorder  OBJECTIVE:   BP 132/79   Pulse 83   Ht 5' 7.5" (1.715 m)   Wt 190 lb 9.6 oz (86.5 kg)   SpO2 99%   BMI 29.41 kg/m   General: Alert and oriented in no apparent distress Heart: Regular rate and rhythm with no murmurs appreciated, tenderness to palpation along the chest wall Lungs: CTA bilaterally, no wheezing Abdomen: Bowel sounds present, no abdominal pain Skin: Warm and dry Extremities: No lower extremity edema   ASSESSMENT/PLAN:   Assessment & Plan Chest wall pain Patient has had extensive workup previously in the emergency room with an EKG, chest x-ray, trops that were all negative.  Patient is hemodynamically stable and it does appear that she is suffering from chest wall strain given presentation and history.  Will order Voltaren gel for the patient as well as send her to physical therapy for further assistance.  Patient does not appear to have evidence of myocardial infarction as she has been previously worked up or evidence of PE, pneumothorax given normal oxygen saturation on room air  and normal heart rate.       Alfredo Martinez, MD [facility]

## 2023-02-14 NOTE — Patient Instructions (Signed)
It was great to see you today! Thank you for choosing [facility] for your primary care.  Today we addressed: Use the voltaren gel four times a day on chest wall  I have sent a referral to PT    If you haven't already, sign up for My Chart to have easy access to your labs results, and communication with your primary care physician. I recommend that you always bring your medications to each appointment as this makes it easy to ensure you are on the correct medications and helps [place] not miss refills when you need them. Call the clinic at [phone] if your symptoms worsen or you have any concerns.  Please arrive 15 minutes before your appointment to ensure smooth check in process.  We appreciate your efforts in making this happen.  Thank you for allowing me to participate in your care, Alfredo Martinez, MD 02/14/2023, 3:58 PM PGY-3, [facility]

## 2023-02-18 DIAGNOSIS — M5416 Radiculopathy, lumbar region: Secondary | ICD-10-CM | POA: Diagnosis not present

## 2023-02-20 ENCOUNTER — Other Ambulatory Visit: Payer: Self-pay | Admitting: Family Medicine

## 2023-02-24 ENCOUNTER — Ambulatory Visit (INDEPENDENT_AMBULATORY_CARE_PROVIDER_SITE_OTHER): Payer: 59 | Admitting: Family Medicine

## 2023-02-24 VITALS — Ht 68.5 in | Wt 192.8 lb

## 2023-02-24 DIAGNOSIS — E119 Type 2 diabetes mellitus without complications: Secondary | ICD-10-CM

## 2023-02-24 DIAGNOSIS — E663 Overweight: Secondary | ICD-10-CM | POA: Diagnosis not present

## 2023-02-24 NOTE — Progress Notes (Signed)
Medical Nutrition Therapy Appt start time: 1115 end time: 1200 (45 minutes) Primary concerns today: Weight management and Blood sugar control.   Relevant history/background: Lori Jones  was referred by Lori Jones, PharmD for MNT related to diabetes.  Other diagnoses include HLD, obesity, hypothyroidism, anxiety, and depression.  She was seen by me for MNT in 2017.  (Goals then included [1] Veg with both lunch and dinner; [2] <2 starches/meal; and [3] walk 60 min 5 X wk and cycle 30 min 2 X wk.)  She had achieved an A1c of 5.8 in April 2017, but A1c on 12/03/22 was 10.1.  DM meds include metformin 1000 mg twice daily and Januvia (sitagliptin) 100 mg once daily.     Assessment:  Lori Jones went to the ER on 02/11/23 with chest pain, which was determined to be muscular; a couple weeks rest seemed to resolve her pain.  She continues to have significant stress from her housing problems.  A1c on 12/25/22 was 8.1%.  She plans to eliminate all meat from her diet for 21 days after Thanksgiving.  [name] brought her FBG record, but could not find her Goals Sheet.  She was not able to recall exactly what her behavioral goals were, and is unaware of the number of carb portions she has at each meal.  She did, however, resolve to be diligent about this going forward.    Learning Readiness: Ready   Weight: 192.8 lb; (wt on 01/27/23 188.4 lb; ht is 68.5".)   Recent FBGs: 140 to 200.  Usual eating pattern: 3 meals and 0-1 snack per day.  Recent physical activity: No exercise the past 3 weeks b/c of chest pain, which has resolved.  Plans to start back exercising today.   Sleep: Estimates was 6-7 hours/night while experiencing chest pain; back to 8-9 hrs now.  Bedtime ~8:30 PM, and up at 5:45 AM.  Food security -  Within the past 12 months, did you run out or worry that you would run out of food, and not have money to get more?  No.    24-hr recall:  (Up at 5 AM) B (7:30 AM)-  1 pkt inst flavored oatmeal, 1 egg, 1 toast,  1/2 tsp jelly, water Snk ( AM)-  --- L (2 PM)-  2 hotdogs, buns, 1 tsp mayo, ketchup, relish, 1 oz chips, 1 pkt choc&vanilla mini Oreos (21 g CHO), water Snk ( PM)-  --- D (7:30 PM)-  1 c collard greens, 1/2 c rice, 1/2 c frzn corn, 1 slc bread, water Snk ( PM)-  --- Typical day? No.   Rarely eats lunch so late; cookies and hotdogs were abnormal, but she was over-hungry.    Nutritional Diagnosis: No progress on NI-5.8.2 Excessive carbohydrate intake As related to snacks.  As evidenced by exceeding carb recommendations at every meal yesterday.    Handouts given during visit include: After-Visit Summary (AVS) Goals Sheet, modified to include a space to record FBG  Barriers to learning/adherence to lifestyle change: Lives alone; has longstanding food habits not conducive to glycemic control.    For behavioral goals and recommendations, see Patient Instructions.    Monitoring/Evaluation:  Dietary intake, exercise, FBG, and body weight  in Lori Jones on Dec 12 .

## 2023-02-24 NOTE — Patient Instructions (Addendum)
Reiterated from last appt:  Timing of eating:  For most of [place], our insulin works best earlier in the day, and becomes less effective (sensitive) as we get closer to bedtime.  For this reason, it makes good sense to eat most of your carbohydrate early in the day instead of near bedtime, and it is probably best to eat larger meals earlier in the day.  Your decision about what to eat when needs to also consider what works in your schedule and what you find satisfying for meals.  NOTE:  What we find satisfying is changeable as we get accustomed to certain types of food and meals and to certain eating schedules.   Controlling your blood sugars:   - Liquid calories (sweet drinks) most effectively raise blood sugars.  (Eat your fruit; don't drink it.)  If you MUST drink something sweet, mix it half-and-half with seltzer or club soda.   - Beans are a great source of protein, including for people who need to control their blood sugar.    Diet Recommendations for Diabetes  Carbohydrate includes starch, sugar, and fiber.  Of these, only sugar and starch raise blood glucose.  (Fiber is found in fruits, vegetables [especially skin, seeds, and stalks], whole grains, and beans.)   Starchy (carb) foods: Bread, rice, pasta, potatoes, sweet potatoes, corn, cereal, grits, crackers, bagels, muffins, all baked goods.  (Fruit, milk, and yogurt also have carbohydrate, but most of these foods will not spike your blood sugar as most starchy or sweet foods will.)  A few fruits do cause high blood sugars; use small portions of bananas (limit to 1/2 at a time), grapes, watermelon, and oranges.   Protein foods: Meat, fish, poultry, eggs, dairy foods (yogurt, milk, cheese), and beans such as pinto and kidney beans (beans also provide carbohydrate).   Goals remain the same:  1. Eat at least 3 REAL meals and at least 1 snack per day.  Have something to eat at least every 5 hours while awake.  - A REAL breakfast needs to include both  starch and protein foods.  (Fruit is optional.)  - A REAL meal for lunch or dinner includes at least some protein, some starch, and vegetables and/or fruit.   - Good snacks include unsalted nuts, up to 1 ounce of cheese, whole-grain crackers, any fresh fruit, most yogurts (look for least amount of sugar), 1/2 sandwich, 1 cheese toast, peanut butter on celery or on crackers.    2. Limit starchy foods to TWO portions per meal and ONE per snack. ONE portion of a starchy food is equal to the following:  - ONE slice of bread (or its equivalent, such as half of a hamburger bun).  - 1/2 cup of a "scoopable" starchy food such as potatoes, pasta, or rice.  - 15 grams of Total Carbohydrate as shown on food label.  - 4 ounces of a sweet drink (including fruit juice).  (Juice is generally NOT recommended; it is a concentrated source of calories and carbohydrate.)  3. Include vegetables in at least 8 meals per week.  - Fresh or frozen vegetables are best.  - Keep frozen vegetables on hand for a quick option.   Also write down fasting blood glucose levels on your Goals Sheet.    Bring your Goals Sheet provided today to your follow-up visit on Thursday, Dec 12 at 3 PM.

## 2023-03-03 ENCOUNTER — Telehealth: Payer: Self-pay

## 2023-03-03 NOTE — Telephone Encounter (Signed)
Transition Care Management Follow-up Telephone Call Date of discharge and from where: 02/11/2023 [facility] How have you been since you were released from the hospital? Patient states she is feeling fine and is very grateful for the excellent care she received. Any questions or concerns? No  Items Reviewed: Did the pt receive and understand the discharge instructions provided? Yes  Medications obtained and verified?  No medication prescribed this visit. Patient is able to afford medications. Other? No  Any new allergies since your discharge? No  Dietary orders reviewed? Yes Do you have support at home? Yes   Follow up appointments reviewed:  PCP Hospital f/u appt confirmed? Yes  Scheduled to see Alfredo Martinez, MD on 02/14/2023 @ [facility]. Specialist Hospital f/u appt confirmed? No  Scheduled to see  on  @ . Are transportation arrangements needed? No  If their condition worsens, is the pt aware to call PCP or go to the Emergency Dept.? Yes Was the patient provided with contact information for the PCP's office or ED? Yes Was to pt encouraged to call back with questions or concerns? Yes   Natan Hartog [facility]  [facility] Direct Dial: [phone]  Website: [email or web address]

## 2023-03-13 ENCOUNTER — Ambulatory Visit: Payer: 59 | Admitting: Family Medicine

## 2023-03-13 VITALS — Ht 68.5 in | Wt 190.2 lb

## 2023-03-13 DIAGNOSIS — E119 Type 2 diabetes mellitus without complications: Secondary | ICD-10-CM

## 2023-03-13 DIAGNOSIS — E663 Overweight: Secondary | ICD-10-CM | POA: Diagnosis not present
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
## 2023-03-13 NOTE — Patient Instructions (Addendum)
You have been doing so well on your previous goals!   We will start some new goals:  Exercise 5 times a week for at least 30 minutes - walking, or exercise video at home  One serving of protein per meal   Sources of protein:  1 egg or 1 ounce of cheese provides about 7 grams of protein.  8 oz of milk or 2 tbsp peanut butter provides 8 grams of protein. 1 cup (8 oz) of most beans provides 15 grams of protein.    Yogurts vary greatly, but [place] yogurt is highest in protein.  NOTE: The size of a deck of cards is equal to about 3-4 ounces of meat, fish, or poultry.  Make a follow up appointment with Dr. Marsh Dolly in 2-4 weeks.

## 2023-03-19 ENCOUNTER — Ambulatory Visit (INDEPENDENT_AMBULATORY_CARE_PROVIDER_SITE_OTHER): Payer: 59 | Admitting: Family Medicine

## 2023-03-19 ENCOUNTER — Encounter: Payer: Self-pay | Admitting: Family Medicine

## 2023-03-19 VITALS — BP 124/74 | HR 69 | Ht 68.0 in | Wt 191.8 lb

## 2023-03-19 DIAGNOSIS — Z124 Encounter for screening for malignant neoplasm of cervix: Secondary | ICD-10-CM

## 2023-03-19 DIAGNOSIS — E119 Type 2 diabetes mellitus without complications: Secondary | ICD-10-CM

## 2023-03-19 MED ORDER — BLOOD GLUCOSE TEST VI STRP: 1.0000 | ORAL_STRIP | Freq: Three times a day (TID) | 0 refills | Status: AC

## 2023-03-19 NOTE — Patient Instructions (Addendum)
Your blood pressure is great!   Keep working on reflecting on what you eat and how it affects your blood sugars. Try to walk after you meal instead of before. This can change how much your blood sugar is raised by the foods you eat.   We will check your A1c in 2 months and can discuss if any changes need to be made at that time.   Work on slow deep breathing to manage stress as well.  I have sent test strips to the pharmacy    You are due for a diabetic eye exam. Please follow up with your ophthalmologist
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
## 2023-03-19 NOTE — Assessment & Plan Note (Signed)
Patient has good insight and motivation for controlling her diet and sugars. Now will need to help with stress.  - Medical legal referral placed for legal aid  - Goal of walking after breakfast each morning  - Continue to check blood sugars  - Continue metformin and januvia  - Follow up with A1c in 2 months  - Go to ophthalmology visit in January.

## 2023-04-09 DIAGNOSIS — E119 Type 2 diabetes mellitus without complications: Secondary | ICD-10-CM | POA: Diagnosis not present

## 2023-04-09 DIAGNOSIS — H401131 Primary open-angle glaucoma, bilateral, mild stage: Secondary | ICD-10-CM | POA: Diagnosis not present

## 2023-04-09 DIAGNOSIS — H25813 Combined forms of age-related cataract, bilateral: Secondary | ICD-10-CM | POA: Diagnosis not present

## 2023-04-09 LAB — HM DIABETES EYE EXAM

## 2023-05-21 DIAGNOSIS — M5416 Radiculopathy, lumbar region: Secondary | ICD-10-CM | POA: Diagnosis not present

## 2023-05-27 ENCOUNTER — Ambulatory Visit: Payer: Self-pay | Admitting: Family Medicine

## 2023-05-27 ENCOUNTER — Ambulatory Visit: Payer: 59 | Admitting: Student

## 2023-05-28 DIAGNOSIS — M5416 Radiculopathy, lumbar region: Secondary | ICD-10-CM | POA: Diagnosis not present

## 2023-06-09 ENCOUNTER — Telehealth: Payer: Self-pay | Admitting: Family Medicine

## 2023-06-09 NOTE — Telephone Encounter (Signed)
 Patient came in stating that she needs a letter from her doctor stating that due to her disability she needs special accommodations for parking close to her apartment and that she needs to be notified by phone before anyone comes to work on her apartment.

## 2023-06-11 NOTE — Telephone Encounter (Signed)
 Patient did not answer, LVM.Will try again later. Penni Bombard CMA
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
## 2023-07-02 DIAGNOSIS — M5416 Radiculopathy, lumbar region: Secondary | ICD-10-CM | POA: Diagnosis not present

## 2023-07-10 ENCOUNTER — Other Ambulatory Visit: Payer: Self-pay | Admitting: Family Medicine

## 2023-07-10 DIAGNOSIS — E039 Hypothyroidism, unspecified: Secondary | ICD-10-CM

## 2023-07-11 ENCOUNTER — Ambulatory Visit (INDEPENDENT_AMBULATORY_CARE_PROVIDER_SITE_OTHER): Admitting: Student

## 2023-07-11 VITALS — BP 129/63 | HR 81 | Ht 68.0 in | Wt 191.6 lb

## 2023-07-11 DIAGNOSIS — J3489 Other specified disorders of nose and nasal sinuses: Secondary | ICD-10-CM

## 2023-07-11 MED ORDER — DOXYCYCLINE HYCLATE 100 MG PO TABS: 100.0000 mg | ORAL_TABLET | Freq: Two times a day (BID) | ORAL | 0 refills | Status: AC

## 2023-07-11 MED ORDER — MUPIROCIN 2 % EX KIT: PACK | CUTANEOUS | 0 refills | Status: DC

## 2023-07-11 NOTE — Patient Instructions (Addendum)
 It was great to see you today! Thank you for choosing [facility] for your primary care.  Today we addressed: Doxycycline 100 mg twice daily for 7 days  Mupiricin ointment twice a day for 5 days  Follow up in 1 week if not improving  See healthcare provider if getting worse  If you haven't already, sign up for My Chart to have easy access to your labs results, and communication with your primary care physician.   Please arrive 15 minutes before your appointment to ensure smooth check in process.  We appreciate your efforts in making this happen.  Thank you for allowing me to participate in your care, Alfredo Martinez, MD 07/11/2023, 11:00 AM PGY-3, [facility]

## 2023-07-11 NOTE — Progress Notes (Signed)
    SUBJECTIVE:   CHIEF COMPLAINT / HPI:   Right Nasal Congestion and Irritation  The patient, with a history of diabetes and chronic back pain, presents with nasal congestion and swelling. They report that these symptoms began after moving houses in February, during which they were exposed to dust and heat. The patient describes the heat as causing discomfort in their nose, leading to a feeling of being "stopped up." They have been using a Q-tip to alleviate the congestion, which has resulted in irritation and swelling of the nasal area. The patient also notes that they have been experiencing drainage from the affected nostril, which they describe as being brown or green in color.  In addition to the nasal symptoms, the patient mentions having an epidural for chronic back pain. They also report having diabetes, which they suggest may not be well-controlled due to recent medical procedures.  PERTINENT  PMH / PSH: Reviewed   OBJECTIVE:   BP 129/63   Pulse 81   Ht 5\' 8"  (1.727 m)   Wt 191 lb 9.6 oz (86.9 kg)   SpO2 98%   BMI 29.13 kg/m   General: Alert and oriented in no apparent distress Head: [place]/AT.   Eyes:  EOMI Ears:  External ears WNL Nose:  Septum midline, erythema and swelling around nares on right with crusting locally     Mouth:  MMM, tonsils non-erythematous, non-edematous.   Heart: Regular rate and rhythm with no murmurs appreciated Abdomen:  no abdominal pain Skin: Warm and dry  ASSESSMENT/PLAN:   Assessment & Plan Nostril infection No evidence of movement of infection into the nose, superficial location in the nose. Treat for overlying bacterial infection after irritation. No Q tips. Mupirocin topically and doxy systemically. Strict return precautions. Ongoing for several weeks, so rare fungal infection highly unlikely.      Alfredo Martinez, MD [facility]

## 2023-08-12 DIAGNOSIS — R8290 Unspecified abnormal findings in urine: Secondary | ICD-10-CM | POA: Diagnosis not present

## 2023-08-12 DIAGNOSIS — N390 Urinary tract infection, site not specified: Secondary | ICD-10-CM | POA: Diagnosis not present

## 2023-08-12 DIAGNOSIS — E119 Type 2 diabetes mellitus without complications: Secondary | ICD-10-CM | POA: Diagnosis not present

## 2023-09-21 ENCOUNTER — Other Ambulatory Visit: Payer: Self-pay | Admitting: Family Medicine

## 2023-09-21 DIAGNOSIS — E785 Hyperlipidemia, unspecified: Secondary | ICD-10-CM

## 2023-09-21 DIAGNOSIS — E089 Diabetes mellitus due to underlying condition without complications: Secondary | ICD-10-CM

## 2023-09-23 DIAGNOSIS — R92323 Mammographic fibroglandular density, bilateral breasts: Secondary | ICD-10-CM | POA: Diagnosis not present

## 2023-09-23 DIAGNOSIS — N6321 Unspecified lump in the left breast, upper outer quadrant: Secondary | ICD-10-CM | POA: Diagnosis not present

## 2023-09-25 ENCOUNTER — Ambulatory Visit (INDEPENDENT_AMBULATORY_CARE_PROVIDER_SITE_OTHER): Admitting: Family Medicine

## 2023-09-25 VITALS — BP 126/84 | HR 99 | Ht 68.0 in | Wt 191.4 lb

## 2023-09-25 DIAGNOSIS — Z0000 Encounter for general adult medical examination without abnormal findings: Secondary | ICD-10-CM

## 2023-09-25 DIAGNOSIS — E119 Type 2 diabetes mellitus without complications: Secondary | ICD-10-CM | POA: Diagnosis not present

## 2023-09-25 DIAGNOSIS — R319 Hematuria, unspecified: Secondary | ICD-10-CM | POA: Diagnosis not present

## 2023-09-25 DIAGNOSIS — E089 Diabetes mellitus due to underlying condition without complications: Secondary | ICD-10-CM

## 2023-09-25 DIAGNOSIS — R300 Dysuria: Secondary | ICD-10-CM | POA: Diagnosis not present

## 2023-09-25 LAB — POCT GLYCOSYLATED HEMOGLOBIN (HGB A1C): HbA1c, POC (controlled diabetic range): 10.8 % — AB (ref 0.0–7.0)

## 2023-09-25 MED ORDER — LANCETS MISC. MISC: 1.0000 | Freq: Three times a day (TID) | 0 refills | Status: AC

## 2023-09-25 MED ORDER — BLOOD GLUCOSE TEST VI STRP
1.0000 | ORAL_STRIP | Freq: Three times a day (TID) | 0 refills | Status: DC
Start: 2023-09-25 — End: 2023-10-28

## 2023-09-25 MED ORDER — ZOSTER VAC RECOMB ADJUVANTED 50 MCG/0.5ML IM SUSR: 0.5000 mL | Freq: Once | INTRAMUSCULAR | 0 refills | Status: AC

## 2023-09-25 MED ORDER — BLOOD GLUCOSE MONITORING SUPPL DEVI: 1.0000 | Freq: Three times a day (TID) | 0 refills | Status: AC

## 2023-09-25 MED ORDER — SITAGLIPTIN PHOSPHATE 100 MG PO TABS: 100.0000 mg | ORAL_TABLET | Freq: Every day | ORAL | 3 refills | Status: DC

## 2023-09-25 MED ORDER — LANCET DEVICE MISC: 1.0000 | Freq: Three times a day (TID) | 0 refills | Status: AC

## 2023-09-25 MED ORDER — METFORMIN HCL 1000 MG PO TABS: 1000.0000 mg | ORAL_TABLET | Freq: Two times a day (BID) | ORAL | 3 refills | Status: DC

## 2023-09-25 MED ORDER — ZOSTER VAC RECOMB ADJUVANTED 50 MCG/0.5ML IM SUSR
0.5000 mL | Freq: Once | INTRAMUSCULAR | 0 refills | Status: DC
Start: 2023-09-25 — End: 2023-09-25

## 2023-09-25 NOTE — Progress Notes (Signed)
    SUBJECTIVE:   CHIEF COMPLAINT / HPI:   T2DM A1c previously 7.9. Has been taking januvia  and metformin  without issue. She has not been able to meet with the nutritionist. She has been eating candy and cookies more than usual.  PERTINENT  PMH / PSH: Ascending aorta dilation, GERD, hypothyroidism, scoliosis, schizoaffective disorder, anxiety and depression  OBJECTIVE:   BP 126/84   Pulse 99   Ht 5' 8 (1.727 m)   Wt 191 lb 6.4 oz (86.8 kg)   SpO2 98%   BMI 29.10 kg/m   General: Alert and oriented, in NAD Skin: Warm, dry, and intact without lesions HEENT: NCAT, EOM grossly normal, midline nasal septum Cardiac: RRR, no m/r/g appreciated Respiratory: CTAB, breathing and speaking comfortably on RA Abdominal: Soft, nontender, nondistended, normoactive bowel sounds Extremities: Moves all extremities grossly equally Neurological: No gross focal deficit Psychiatric: Appropriate mood and affect   ASSESSMENT/PLAN:   Assessment & Plan Diabetes mellitus without complication (HCC) A1c today 10.8, up from 7.9 seven months ago.  In the setting of dietary indiscretions.  Refilled her current medications.  She is hesitant to medication changes today and would rather discuss further with PCP after initiating lifestyle changes.  Brief nutritional counseling provided.  Will need formal follow-up with nutritionist.  She will make an appointment soon with PCP for further discussion. Healthcare maintenance Shingles vaccine printed and provided today.  She politely declined others for now.  She has her Medicare annual wellness visit scheduled in July.  Urine albumin creatinine ratio obtained from outside source and was 57 mg/dL on 4/81/7974; this has been added to her chart.   Stuart Redo, MD [facility]

## 2023-09-25 NOTE — Patient Instructions (Addendum)
 Continue your medications and improving your diet and exercise! Schedule a follow up with Dr. Nicholas.  I have given you a printed prescription for the shingles vaccine to get at your pharmacy.  I have sent in refills of the diabetes medications as well as a glucose meter.

## 2023-09-26 NOTE — Assessment & Plan Note (Signed)
 A1c today 10.8, up from 7.9 seven months ago.  In the setting of dietary indiscretions.  Refilled her current medications.  She is hesitant to medication changes today and would rather discuss further with PCP after initiating lifestyle changes.  Brief nutritional counseling provided.  Will need formal follow-up with nutritionist.  She will make an appointment soon with PCP for further discussion.

## 2023-10-06 ENCOUNTER — Telehealth: Payer: Self-pay | Admitting: Pharmacist

## 2023-10-06 NOTE — Telephone Encounter (Signed)
 Patient contacted for follow-up of recent POOR A1c Control of 10.8  Since last contact patient reports she is trying to work with diet and exercise planning.  She reports she has been trying to see her primary, Dr. Nicholas, however, she recognizes that Dr. Nicholas has been out of the office for several months.    I shared with her the Dr. Nicholas is available and back at work. I scheduled her an appointment with PCP on 7/22  Medication Plan: - Consider Liberate study  (she was NOT eligible at last assessment as A1C was 7.9 at that time).  - Consider additional medication for diabetes control at next visit - patient has been reluctant to change medication.   Total time with patient call and documentation of interaction: 12 minutes.

## 2023-10-06 NOTE — Telephone Encounter (Signed)
 Reviewed and agree with Dr Macky Lower plan.

## 2023-10-15 DIAGNOSIS — M5416 Radiculopathy, lumbar region: Secondary | ICD-10-CM | POA: Diagnosis not present

## 2023-10-16 ENCOUNTER — Telehealth: Payer: Self-pay | Admitting: Family Medicine

## 2023-10-16 ENCOUNTER — Ambulatory Visit: Admitting: Family Medicine

## 2023-10-16 ENCOUNTER — Ambulatory Visit: Payer: 59

## 2023-10-16 ENCOUNTER — Encounter: Payer: Self-pay | Admitting: Family Medicine

## 2023-10-16 VITALS — Ht 68.0 in | Wt 188.0 lb

## 2023-10-16 VITALS — BP 139/81 | HR 69 | Temp 98.2°F | Ht 68.0 in | Wt 191.6 lb

## 2023-10-16 DIAGNOSIS — Z0000 Encounter for general adult medical examination without abnormal findings: Secondary | ICD-10-CM | POA: Diagnosis not present

## 2023-10-16 DIAGNOSIS — R21 Rash and other nonspecific skin eruption: Secondary | ICD-10-CM

## 2023-10-16 NOTE — Patient Instructions (Signed)
 I am glad your rash is getting better on its own.  If it starts to get itchy at all, you can try over-the-counter hydrocortisone itch cream  Let us  know if the rash worsens or spreads to other parts of your body, starts to drain fluid or pus, or starts bleeding or becomes painful

## 2023-10-16 NOTE — Telephone Encounter (Signed)
 Patient was identified as falling into the [place] Measure - Diabetes.   Patient was: already seen on 09/25/23

## 2023-10-16 NOTE — Progress Notes (Signed)
 Because this visit was a virtual/telehealth visit,  certain criteria was not obtained, such a blood pressure, CBG if applicable, and timed get up and go. Any medications not marked as taking were not mentioned during the medication reconciliation part of the visit. Any vitals not documented were not able to be obtained due to this being a telehealth visit or patient was unable to self-report a recent blood pressure reading due to a lack of equipment at home via telehealth. Vitals that have been documented are verbally provided by the patient.   Subjective:   Lori Jones is a 63 y.o. who presents for a Medicare Wellness preventive visit.  As a reminder, Annual Wellness Visits don't include a physical exam, and some assessments may be limited, especially if this visit is performed virtually. We may recommend an in-person follow-up visit with your provider if needed.  Visit Complete: Virtual I connected with  Lori Jones on 10/16/23 by a audio enabled telemedicine application and verified that I am speaking with the correct person using two identifiers.  Patient Location: Home  Provider Location: Office/Clinic  I discussed the limitations of evaluation and management by telemedicine. The patient expressed understanding and agreed to proceed.  Vital Signs: Because this visit was a virtual/telehealth visit, some criteria may be missing or patient reported. Any vitals not documented were not able to be obtained and vitals that have been documented are patient reported.  VideoDeclined- This patient declined Librarian, academic. Therefore the visit was completed with audio only.  Persons Participating in Visit: Patient.  AWV Questionnaire: Yes: Patient Medicare AWV questionnaire was completed by the patient on 10/14/2023; I have confirmed that all information answered by patient is correct and no changes since this date.  Cardiac Risk Factors include: advanced  age (>66men, >65 women);diabetes mellitus;dyslipidemia;hypertension;family history of premature cardiovascular disease     Objective:    Today's Vitals   10/16/23 0955 10/16/23 1007  Weight: 188 lb (85.3 kg)   Height: 5' 8 (1.727 m)   PainSc: 0-No pain 0-No pain   Body mass index is 28.59 kg/m.     10/16/2023    9:56 AM 07/11/2023   10:35 AM 02/14/2023    3:12 PM 02/14/2023    3:11 PM 02/11/2023    9:01 AM 01/20/2023    1:44 PM 01/13/2023    5:11 PM  Advanced Directives  Does Patient Have a Medical Advance Directive? No No No No No No No  Would patient like information on creating a medical advance directive? No - Patient declined No - Patient declined No - Patient declined No - Patient declined  No - Patient declined No - Patient declined    Current Medications (verified) Outpatient Encounter Medications as of 10/16/2023  Medication Sig   Accu-Chek Softclix Lancets lancets USE UP TO FOUR TIMES DAILY AS DIRECTED   aspirin  EC 81 MG tablet Take 1 tablet (81 mg total) by mouth daily.   atorvastatin  (LIPITOR) 40 MG tablet TAKE 1 TABLET(40 MG) BY MOUTH DAILY   b complex vitamins capsule Take 1 capsule by mouth daily.   blood glucose meter kit and supplies KIT Dispense based on patient and insurance preference. Use up to four times daily as directed.   Blood Glucose Monitoring Suppl DEVI 1 each by Does not apply route in the morning, at noon, and at bedtime. May substitute to any manufacturer covered by patient's insurance.   diclofenac  Sodium (VOLTAREN ) 1 % GEL Apply 2 g topically  4 (four) times daily as needed (pain).   ELDERBERRY PO Take by mouth.   escitalopram  (LEXAPRO ) 10 MG tablet Take 1 tablet (10 mg total) by mouth daily.   gabapentin  (NEURONTIN ) 100 MG capsule Take 1 capsule (100 mg total) by mouth 3 (three) times daily.   Ginger, Zingiber officinalis, (GINGER ROOT) 500 MG CAPS Take 500 mg by mouth daily.   Glucose Blood (BLOOD GLUCOSE TEST STRIPS) STRP 1 each by In Vitro  route in the morning, at noon, and at bedtime. May substitute to any manufacturer covered by patient's insurance.   Lancet Device MISC 1 each by Does not apply route in the morning, at noon, and at bedtime. May substitute to any manufacturer covered by patient's insurance.   Lancets Misc. MISC 1 each by Does not apply route in the morning, at noon, and at bedtime. May substitute to any manufacturer covered by patient's insurance.   levothyroxine  (SYNTHROID ) 25 MCG tablet TAKE 1 TABLET BY MOUTH DAILY 30 MINUTES BEFORE BREAKFAST   metFORMIN  (GLUCOPHAGE ) 1000 MG tablet Take 1 tablet (1,000 mg total) by mouth 2 (two) times daily with a meal.   Multiple Vitamins-Minerals (ZINC  PO) Take by mouth.   Mupirocin  2 % KIT In nostril twice daily for 5 days   sitaGLIPtin  (JANUVIA ) 100 MG tablet Take 1 tablet (100 mg total) by mouth daily.   Turmeric (QC TUMERIC COMPLEX PO) Take by mouth.   zinc  sulfate 220 (50 Zn) MG capsule Take 220 mg by mouth daily.   No facility-administered encounter medications on file as of 10/16/2023.    Allergies (verified) Clarithromycin, Sumatriptan, Canagliflozin, Canagliflozin-metformin  hcl, Chlorhexidine , and Sumatriptan succinate   History: Past Medical History:  Diagnosis Date   Allergy    Anemia    Anginal pain (HCC)    hx of CPs   Anxiety    Arthritis    Ascending aorta dilatation (HCC)    38mm by echo 08/2020   Asthma    hx of in childhood    Back pain, chronic    Bursitis    Depression    Diabetes mellitus    Foot fracture, right    Heart murmur    Herniated disc    Hypercholesteremia    Hypertension    no meds   Hypothyroid    Kidney disease    pelvic kidney bilat    Scoliosis    Seizures (HCC)    hx of in childhood and again in college    Stroke [facility])    hx of sun stroke while in college    Past Surgical History:  Procedure Laterality Date   ABDOMINAL HYSTERECTOMY     CESAREAN SECTION     CHOLECYSTECTOMY N/A 09/08/2014   Procedure:  LAPAROSCOPIC CHOLECYSTECTOMY;  Surgeon: Vicenta Poli, MD;  Location: [facility];  Service: General;  Laterality: N/A;   HAND SURGERY     dog bite   INCISIONAL HERNIA REPAIR N/A 04/24/2016   Procedure: HERNIA REPAIR INCISIONAL;  Surgeon: Vicenta Poli, MD;  Location: [facility] OR;  Service: General;  Laterality: N/A;   INSERTION OF MESH N/A 04/24/2016   Procedure: INSERTION OF MESH;  Surgeon: Vicenta Poli, MD;  Location: [facility] OR;  Service: General;  Laterality: N/A;   Thumb surgery     VENTRAL HERNIA REPAIR N/A 09/08/2014   Procedure: VENTRAL HERNIA REPAIR;  Surgeon: Vicenta Poli, MD;  Location: [facility];  Service: General;  Laterality: N/A;   Family History  Problem Relation Age of Onset   Cancer  Mother    Depression Mother    Diabetes Mother    Heart disease Mother 89       Stents   Hypertension Mother    Hyperlipidemia Mother    Thyroid  disease Mother    Cancer Father    Sickle cell anemia Brother    Diabetes Sister    Social History   Socioeconomic History   Marital status: Single    Spouse name: Not on file   Number of children: 1   Years of education: 16   Highest education level: Bachelor's degree (e.g., BA, AB, BS)  Occupational History   Occupation: Retired    Comment: Social Work  Tobacco Use   Smoking status: Never    Passive exposure: Never   Smokeless tobacco: Never  Vaping Use   Vaping status: Never Used  Substance and Sexual Activity   Alcohol use: No    Alcohol/week: 0.0 standard drinks of alcohol   Drug use: No   Sexual activity: Not Currently  Other Topics Concern   Not on file  Social History Narrative   Patient lives alone in [place].    Patient has bachelors in social work. 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 Does not work full time.   Patient has one daughter who lives in [place], no grandchildren.    Patient enjoys reading, watching westerns, and fellowship.   Patient walks for exercise 4x per week.     Social Drivers of Corporate investment banker Strain: Low Risk  (10/16/2023)   Overall Financial Resource Strain (CARDIA)    Difficulty of Paying Living Expenses: Not hard at all  Food Insecurity: No Food Insecurity (10/16/2023)   Hunger Vital Sign    Worried About Running Out of Food in the Last Year: Never true    Ran Out of Food in the Last Year: Never true  Transportation Needs: No Transportation Needs (10/16/2023)   PRAPARE - Administrator, Civil Service (Medical): No    Lack of Transportation (Non-Medical): No  Physical Activity: Sufficiently Active (10/16/2023)   Exercise Vital Sign    Days of Exercise per Week: 5 days    Minutes of Exercise per Session: 30 min  Recent Concern: Physical Activity - Insufficiently Active (09/25/2023)   Exercise Vital Sign    Days of Exercise per Week: 3 days    Minutes of Exercise per Session: 30 min  Stress: No Stress Concern Present (10/16/2023)   [employer] of Occupational Health - Occupational Stress Questionnaire    Feeling of Stress: Not at all  Social Connections: Moderately Integrated (10/16/2023)   Social Connection and Isolation Panel    Frequency of Communication with Friends and Family: More than three times a week    Frequency of Social Gatherings with Friends and Family: More than three times a week    Attends Religious Services: More than 4 times per year    Active Member of [employer] or Organizations: Yes    Attends Engineer, structural: More than 4 times per year    Marital Status: Never married    Tobacco Counseling Counseling given: Not Answered    Clinical Intake:  Pre-visit preparation completed: Yes  Pain : No/denies pain Pain Score: 0-No pain     BMI - recorded: 28.59 Nutritional Status: BMI 25 -29 Overweight Nutritional Risks: None Diabetes: Yes CBG done?: No Did pt. bring in CBG monitor from home?: No  Lab Results  Component Value Date   HGBA1C 10.8 (A) 09/25/2023   HGBA1C  7.9 (A) 02/04/2023   HGBA1C 10.1 (A) 12/03/2022     How often do you need to have someone help you when you read instructions, pamphlets, or other written materials from your doctor or pharmacy?: 1 - Never What is the last grade level you completed in school?: College  Interpreter Needed?: No  Information entered by :: Luda Charbonneau N. Dakari Cregger, LPN.   Activities of Daily Living     10/16/2023   10:11 AM 10/14/2023    7:37 AM  In your present state of health, do you have any difficulty performing the following activities:  Hearing? 0 0  Vision? 0 0  Difficulty concentrating or making decisions? 0 0  Walking or climbing stairs? 0 0  Dressing or bathing? 0 0  Doing errands, shopping? 0 0  Preparing Food and eating ? N N  Using the Toilet? N N  In the past six months, have you accidently leaked urine? N N  Do you have problems with loss of bowel control? N N  Managing your Medications? N N  Managing your Finances? N N  Housekeeping or managing your Housekeeping? N N    Patient Care Team: Nicholas Bar, MD as PCP - General (Family Medicine) Shlomo Wilbert SAUNDERS, MD as PCP - Cardiology (Cardiology) Octavia Charleston, MD as Consulting Physician (Ophthalmology) Kristie Lamprey, MD as Consulting Physician (Gastroenterology) Shari Easter, MD as Consulting Physician (Orthopedic Surgery) Vernetta Berg, MD as Consulting Physician (General Surgery) Dannielle Bouchard, DO as Consulting Physician (Obstetrics and Gynecology) Weyman Wilbert, RN as Case Manager  I have updated your Care Teams any recent Medical Services you may have received from other providers in the past year.     Assessment:   This is a routine wellness examination for Lori Jones.  Hearing/Vision screen Hearing Screening - Comments:: Patient has hearing difficulties in left ear, no hearing aids.  Vision Screening - Comments:: Wears rx glasses - up to date with routine eye exams with [facility]    Goals Addressed              This Visit's Progress    10/16/23: I am more conscious of my health now and I want to maintain my diabetes by eating right, drinking more water and increasing my time at the [facility].         Depression Screen     10/16/2023    9:59 AM 07/11/2023   10:29 AM 03/19/2023   10:11 AM 02/14/2023    3:32 PM 02/14/2023    3:13 PM 01/20/2023    1:45 PM 12/03/2022    1:54 PM  PHQ 2/9 Scores  PHQ - 2 Score 0 0 0 0  0 0  PHQ- 9 Score 0 0 0 0  0 0  Exception Documentation     Patient refusal      Fall Risk     10/16/2023   10:09 AM 10/14/2023    7:37 AM 03/19/2023   10:11 AM 10/11/2022    6:07 PM 05/03/2022   10:32 AM  Fall Risk   Falls in the past year? 1 1 0 0 0  Number falls in past yr: 0 0 0 0 0  Injury with Fall? 1 1 0 0 0  Risk for fall due to :    No Fall Risks   Follow up Falls evaluation completed;Education provided   Falls prevention discussed;Education provided;Falls evaluation completed Falls  evaluation completed    MEDICARE RISK AT HOME:  Medicare Risk at Home Any stairs in or around the home?: No If so, are there any without handrails?: No Home free of loose throw rugs in walkways, pet beds, electrical cords, etc?: Yes Adequate lighting in your home to reduce risk of falls?: Yes Life alert?: No Use of a cane, walker or w/c?: No Grab bars in the bathroom?: Yes Shower chair or bench in shower?: No Elevated toilet seat or a handicapped toilet?: No  TIMED UP AND GO:  Was the test performed?  No  Cognitive Function: Declined/Normal: No cognitive concerns noted by patient or family. Patient alert, oriented, able to answer questions appropriately and recall recent events. No signs of memory loss or confusion.    10/16/2023    9:59 AM 03/17/2018    9:36 AM  MMSE - Mini Mental State Exam  Not completed: Unable to complete   Orientation to time  5  Orientation to Place  5  Registration  3  Attention/ Calculation  5  Recall  3  Language- name 2 objects  2  Language- repeat  1   Language- follow 3 step command  3  Language- read & follow direction  1  Write a sentence  1  Copy design  1  Total score  30        10/16/2023   10:08 AM 10/11/2022    6:08 PM 11/13/2021    2:15 PM 09/13/2020    9:23 AM 03/17/2018    9:36 AM  6CIT Screen  What Year? 0 points 0 points 0 points 0 points 0 points  What month? 0 points 0 points 0 points 0 points 0 points  What time? 0 points 0 points 0 points 0 points 0 points  Count back from 20 0 points 0 points 0 points 0 points 0 points  Months in reverse 0 points 0 points 0 points 0 points 0 points  Repeat phrase 0 points 0 points 0 points 0 points 0 points  Total Score 0 points 0 points 0 points 0 points 0 points    Immunizations Immunization History  Administered Date(s) Administered   PFIZER Comirnaty(Gray Top)Covid-19 Tri-Sucrose Vaccine 09/13/2020   PFIZER(Purple Top)SARS-COV-2 Vaccination 06/24/2019, 07/19/2019, 02/14/2020   Pfizer Covid-19 Vaccine Bivalent Booster 58yrs & up 04/30/2021   Tdap 06/22/2015, 11/05/2022   Unspecified SARS-COV-2 Vaccination 01/05/2021   Zoster Recombinant(Shingrix) 09/25/2023    Screening Tests Health Maintenance  Topic Date Due   COVID-19 Vaccine (7 - 2024-25 season) 12/01/2023 (Originally 12/01/2022)   Cervical Cancer Screening (HPV/Pap Cotest)  03/18/2024 (Originally 03/01/2018)   Pneumococcal Vaccine 28-57 Years old (1 of 2 - PCV) 11/30/2024 (Originally 09[birth year]03/1978)   INFLUENZA VACCINE  10/31/2023   Zoster Vaccines- Shingrix (2 of 2) 11/20/2023   Diabetic kidney evaluation - eGFR measurement  02/11/2024   HEMOGLOBIN A1C  03/26/2024   OPHTHALMOLOGY EXAM  04/08/2024   Diabetic kidney evaluation - Urine ACR  08/16/2024   FOOT EXAM  09/24/2024   MAMMOGRAM  10/08/2024   Medicare Annual Wellness (AWV)  10/15/2024   Colonoscopy  09/23/2027   DTaP/Tdap/Td (3 - Td or Tdap) 11/04/2032   Hepatitis C Screening  Completed   HIV Screening  Completed   Hepatitis B Vaccines  Aged Out   HPV  VACCINES  Aged Out   Meningococcal B Vaccine  Aged Out    Health Maintenance  There are no preventive care reminders to display for this patient.  Health Maintenance  Items Addressed: Yes Patient aware of current care gaps.  Immunization record was verified by [facility] and updated in patient's chart.  Additional Screening:  Vision Screening: Recommended annual ophthalmology exams for early detection of glaucoma and other disorders of the eye. Would you like a referral to an eye doctor? No    Dental Screening: Recommended annual dental exams for proper oral hygiene  Community Resource Referral / Chronic Care Management: CRR required this visit?  No   CCM required this visit?  No   Plan:    I have personally reviewed and noted the following in the patient's chart:   Medical and social history Use of alcohol, tobacco or illicit drugs  Current medications and supplements including opioid prescriptions. Patient is not currently taking opioid prescriptions. Functional ability and status Nutritional status Physical activity Advanced directives List of other physicians Hospitalizations, surgeries, and ER visits in previous 12 months Vitals Screenings to include cognitive, depression, and falls Referrals and appointments  In addition, I have reviewed and discussed with patient certain preventive protocols, quality metrics, and best practice recommendations. A written personalized care plan for preventive services as well as general preventive health recommendations were provided to patient.   Lori LOISE Fuller, LPN   [id number]   After Visit Summary: (MyChart) Due to this being a telephonic visit, the after visit summary with patients personalized plan was offered to patient via MyChart   Notes: Nothing significant to report at this time.

## 2023-10-16 NOTE — Patient Instructions (Signed)
 Lori Jones , Thank you for taking time out of your busy schedule to complete your Annual Wellness Visit with me. I enjoyed our conversation and look forward to speaking with you again next year. I, as well as your care team,  appreciate your ongoing commitment to your health goals. Please review the following plan we discussed and let me know if I can assist you in the future. Your Game plan/ To Do List    Referrals: If you haven't heard from the office you've been referred to, please reach out to them at the phone provided.   Follow up Visits: Next Medicare AWV with our clinical staff: 10/18/2024 at 9:50 a.m. phone visit with Nurse Health Advisor   Have you seen your provider in the last 6 months (3 months if uncontrolled diabetes)? Yes Next Office Visit with your provider: 10/21/2023 at 9:50 a.m. office visit with Dr. Nicholas  Clinician Recommendations:  Aim for 30 minutes of exercise or brisk walking, 6-8 glasses of water, and 5 servings of fruits and vegetables each day.       This is a list of the screening recommended for you and due dates:  Health Maintenance  Topic Date Due   COVID-19 Vaccine (7 - 2024-25 season) 12/01/2023*   Pap with HPV screening  03/18/2024*   Pneumococcal Vaccination (1 of 2 - PCV) 11/30/2024*   Flu Shot  10/31/2023   Zoster (Shingles) Vaccine (2 of 2) 11/20/2023   Yearly kidney function blood test for diabetes  02/11/2024   Hemoglobin A1C  03/26/2024   Eye exam for diabetics  04/08/2024   Yearly kidney health urinalysis for diabetes  08/16/2024   Complete foot exam   09/24/2024   Mammogram  10/08/2024   Medicare Annual Wellness Visit  10/15/2024   Colon Cancer Screening  09/23/2027   DTaP/Tdap/Td vaccine (3 - Td or Tdap) 11/04/2032   Hepatitis C Screening  Completed   HIV Screening  Completed   Hepatitis B Vaccine  Aged Out   HPV Vaccine  Aged Out   Meningitis B Vaccine  Aged Out  *Topic was postponed. The date shown is not the original due date.     Advanced directives: (Provided) Advance directive discussed with you today. I have provided a copy for you to complete at home and have notarized. Once this is complete, please bring a copy in to our office so we can scan it into your chart.  Advance Care Planning is important because it:  [x]  Makes sure you receive the medical care that is consistent with your values, goals, and preferences  [x]  It provides guidance to your family and loved ones and reduces their decisional burden about whether or not they are making the right decisions based on your wishes.  Follow the link provided in your after visit summary or read over the paperwork we have mailed to you to help you started getting your Advance Directives in place. If you need assistance in completing these, please reach out to us  so that we can help you!  See attachments for Preventive Care and Fall Prevention Tips.

## 2023-10-16 NOTE — Progress Notes (Signed)
    SUBJECTIVE:   CHIEF COMPLAINT / HPI:   Rash on L  back/side Present since Sunday 7/13 Was on a trip to [facility] for a conference and stayed in a hotel, reports pest control cleared the room - no bedbugs No pain, itching, bleeding, blistering, drainage, fevers Has improved significantly since onset  Thinks maybe it was the sheets from the hotel No bug or tick bites  Did recently get shingles shot  This is what the rash looked like when she first noticed it on 7/13:   PERTINENT  PMH / PSH: DM, HTN, hypothyroidism, schizoaffective disorder, anxiety/depression  OBJECTIVE:   BP 139/81   Pulse 69   Temp 98.2 F (36.8 C)   Ht 5' 8 (1.727 m)   Wt 191 lb 9.6 oz (86.9 kg)   SpO2 98%   BMI 29.13 kg/m   General: NAD, pleasant, able to participate in exam Respiratory: No respiratory distress Skin: Erythematous scattered maculopapular rash over L flank, as in image below. No obvious fluid collection, blistering, or excoriation/bleeding.    ASSESSMENT/PLAN:   Assessment & Plan Rash and nonspecific skin eruption ?contact dermatitis Reassured that it has been improving on its own and not causing any symptoms including pain or itching Low suspicion for shingles, does not seem to be related to bug bites Provided reassurance, advised she can use OTC hydrocortisone cream if needed, discussed return precautions   Payton Coward, MD [facility]

## 2023-10-21 ENCOUNTER — Encounter: Payer: Self-pay | Admitting: Family Medicine

## 2023-10-21 ENCOUNTER — Ambulatory Visit (INDEPENDENT_AMBULATORY_CARE_PROVIDER_SITE_OTHER): Admitting: Family Medicine

## 2023-10-21 VITALS — BP 129/76 | HR 73 | Ht 67.0 in | Wt 190.2 lb

## 2023-10-21 DIAGNOSIS — E089 Diabetes mellitus due to underlying condition without complications: Secondary | ICD-10-CM | POA: Diagnosis not present

## 2023-10-21 DIAGNOSIS — Z0000 Encounter for general adult medical examination without abnormal findings: Secondary | ICD-10-CM | POA: Diagnosis not present

## 2023-10-21 NOTE — Patient Instructions (Signed)
 It was wonderful to see you today.  Please bring ALL of your medications with you to every visit.   Today we talked about:  Diabetes - I recommend checking your fasting blood sugar every day. Continue to limit intake of simple carbohydrates and increase your activity level (which you have been doing a great job of!). I would recommend the continuous glucose monitor while you are doing these efforts to decrease your blood sugar. We will follow up in 2 months to check your A1c. I have placed a nutrition referral so look out for a call from them.   Supplements - Please send me a picture of the supplements you are on.   Blood pressure - Your blood pressure looks good today.   Please follow up in 2 months.   Thank you for choosing [facility].   Please call [phone] with any questions about today's appointment.  Areta Saliva, MD  Family Medicine

## 2023-10-21 NOTE — Progress Notes (Signed)
    SUBJECTIVE:   CHIEF COMPLAINT / HPI:   Diabetes  Patient recently saw Dr. Tharon on 6/26 for diabetes follow up. At that time patient's A1c had increased from 7.9 to 10.8. Patient had multiple stressors going on in her life the months prior to this visit including housing struggles. Patient is very adamant that she does not want to change any medications at this time and wants to address her diabetes naturally, in her words.  She has started taking ginger root, bitter melon, and one other supplement that she will send me a picture of.  She says that she has completely changed her eating habits and has increased her exercise. Has done almost 100k steps in the last week.  She is interested in a nutrition consult.  She is not interested in a CGM but is checking her blood sugars.  She has been consistent with januvia  and metformin .    PERTINENT  PMH / PSH: T2DM, Ascending aorta dilation, Hypothyroidism   OBJECTIVE:   BP 129/76   Pulse 73   Ht 5' 7 (1.702 m)   Wt 190 lb 3.2 oz (86.3 kg)   SpO2 100%   BMI 29.79 kg/m   General: Well appearing, in no acute distress  CV: well perfused  Resp: no increased work of breathing on room air  Psyc: pleasant, organized thought, judgement appropriate   ASSESSMENT/PLAN:   Assessment & Plan Diabetes mellitus due to underlying condition without complication, without long-term current use of insulin  (HCC) Diabetes uncontrolled at this time. Last A1c 3 weeks ago 10.8. No advanced complications of diabetes at this time. Would benefit from adjusting medication management. However, patient declines at this time. Patient counseled regarding risks of high A1c and long diabetes risks. Patient understands risks and declines medication adjustment. Declines CGM.  - Nutrition consult  - Follow up in 2 months to recheck A1c  - Continue januvia  and metformin   - Asked patient to send picture of supplements that she is taking  - Counseled regarding appropriate  diet and exercise to improve diabetes.       Areta Saliva, MD [facility]

## 2023-10-25 ENCOUNTER — Other Ambulatory Visit: Payer: Self-pay | Admitting: Family Medicine

## 2023-11-18 ENCOUNTER — Other Ambulatory Visit: Payer: Self-pay | Admitting: Family Medicine

## 2023-11-28 ENCOUNTER — Other Ambulatory Visit: Payer: Self-pay | Admitting: Family Medicine

## 2023-12-26 ENCOUNTER — Encounter: Payer: Self-pay | Admitting: Family Medicine

## 2024-01-15 ENCOUNTER — Encounter: Admitting: Family Medicine

## 2024-01-16 ENCOUNTER — Ambulatory Visit: Admitting: Dietician

## 2024-01-20 ENCOUNTER — Encounter: Payer: Self-pay | Admitting: Family Medicine

## 2024-01-20 ENCOUNTER — Ambulatory Visit: Admitting: Family Medicine

## 2024-01-20 VITALS — BP 118/70 | HR 67 | Ht 67.0 in | Wt 190.0 lb

## 2024-01-20 DIAGNOSIS — E538 Deficiency of other specified B group vitamins: Secondary | ICD-10-CM

## 2024-01-20 DIAGNOSIS — E039 Hypothyroidism, unspecified: Secondary | ICD-10-CM

## 2024-01-20 DIAGNOSIS — Z0000 Encounter for general adult medical examination without abnormal findings: Secondary | ICD-10-CM | POA: Diagnosis not present

## 2024-01-20 DIAGNOSIS — E089 Diabetes mellitus due to underlying condition without complications: Secondary | ICD-10-CM | POA: Diagnosis not present

## 2024-01-20 LAB — POCT GLYCOSYLATED HEMOGLOBIN (HGB A1C): HbA1c, POC (controlled diabetic range): 9.7 % — AB (ref 0.0–7.0)

## 2024-01-20 MED ORDER — LEVOTHYROXINE SODIUM 25 MCG PO TABS: ORAL_TABLET | ORAL | 3 refills | Status: AC

## 2024-01-20 MED ORDER — METFORMIN HCL 1000 MG PO TABS: 1000.0000 mg | ORAL_TABLET | Freq: Two times a day (BID) | ORAL | 3 refills | Status: AC

## 2024-01-20 MED ORDER — SEMAGLUTIDE(0.25 OR 0.5MG/DOS) 2 MG/1.5ML ~~LOC~~ SOPN: 0.2500 mg | PEN_INJECTOR | SUBCUTANEOUS | 0 refills | Status: DC

## 2024-01-20 NOTE — Progress Notes (Signed)
   SUBJECTIVE:   CHIEF COMPLAINT / HPI:  Discussed the use of AI scribe software for clinical note transcription with the patient, who gave verbal consent to proceed.  History of Present Illness Lori Jones is a 63 year old female who presents for an annual physical exam.  Diabetes mellitus management - Diabetes managed with metformin  and Januvia  - Supplements with ginger and turmeric - No mention of hypoglycemic episodes or complications  Thyroid  disorder - Takes Synthroid  for thyroid  management - No current symptoms of hypothyroidism or hyperthyroidism described  Mood disorder  Patient no longer takes SSRI and does not feel it is needed. Felt like her mood symptoms were due to her environmental situation previously.     PERTINENT  PMH / PSH: T2dM, Hypothyroidism   OBJECTIVE:  BP 138/80   Pulse 67   Ht 5' 7 (1.702 m)   Wt 190 lb (86.2 kg)   SpO2 98%   BMI 29.76 kg/m    General: well appearing, in no acute distress CV: RRR, no murmurs, rubs or gallops, radial pulses equal and palpable, no BLE edema  Resp: Normal work of breathing on room air, CTAB Abd: Soft, non tender, non distended  Neuro: Alert & Oriented x 4  Psyc: pleasant, conversant, good insight and judgement   ASSESSMENT/PLAN:   Assessment & Plan Diabetes mellitus due to underlying condition without complication, without long-term current use of insulin  (HCC) Poorly controlled with HbA1c of 9.7. Current management includes Januvia  and Metformin . Discussed Ozempic as an alternative to insulin  for better control. Explained Ozempic's long-term use and potential dose reduction with improved control. - Urine acr, BMP  - Continue metformin  and januvia   - Patient declined CGM appt with Dr. Koval  - Will order B12 due to long term metformin  use  - Initiate Ozempic at the lowest dose. - Follow up in one month to assess response to Ozempic. Hypothyroidism, unspecified type Managed with Synthroid . - TSH  PHQ  score 0, reviewed and discussed.  BP reviewed and at goal .   Considered the following items based upon USPSTF recommendations: Diabetes screening: ordered STI testing: low risk abstinent  Lipid panel (nonfasting or fasting) discussed based upon AHA recommendations and recently completed and repeat not yet indicated.  Consider repeat every 4-6 years.  Reviewed risk factors for latent tuberculosis and not indicated   Cancer Screening Discussion  Cervical cancer screening: prior Pap reviewed, repeat due in end of 2025, scheduled with gynecologist Breast cancer screening: due for mammogram. Gave patient information. Lung cancer screening:not indicated as does not meet criteria.  See documentation below regarding indications/risks/benefits.  Colorectal cancer screening: up to date on screening for CRC..  Vaccinations UTD other than shingles, recommended to get at the pharmacy .    MyChart Activation: Already signed up  Areta Saliva, MD [facility]

## 2024-01-20 NOTE — Patient Instructions (Addendum)
 It was wonderful to see you today.  Please bring ALL of your medications with you to every visit.   Today we talked about:  Annual physical - Please get your second shingles shot at the pharmacy.    For your diabetes your A1c today is 9.7. This is still high. Thus, I am starting you on ozempic. This will be a weekly injection.  I will also check your thyroid  and B12 levels today since you have been on metformin  long term.    Please follow up in 1 month  Thank you for choosing [facility].   Please call [phone] with any questions about today's appointment.  Please be sure to schedule follow up at the front desk before you leave today.   Areta Saliva, MD  Family Medicine

## 2024-01-20 NOTE — Assessment & Plan Note (Signed)
 Managed with Synthroid . - TSH

## 2024-01-21 ENCOUNTER — Ambulatory Visit: Payer: Self-pay | Admitting: Family Medicine

## 2024-01-21 LAB — MICROALBUMIN / CREATININE URINE RATIO
Creatinine, Urine: 98.7 mg/dL
Microalb/Creat Ratio: 52 mg/g{creat} — ABNORMAL HIGH (ref 0–29)
Microalbumin, Urine: 51.2 ug/mL

## 2024-01-21 LAB — BASIC METABOLIC PANEL WITH GFR
BUN/Creatinine Ratio: 13 (ref 12–28)
BUN: 9 mg/dL (ref 8–27)
CO2: 18 mmol/L — ABNORMAL LOW (ref 20–29)
Calcium: 9.3 mg/dL (ref 8.7–10.3)
Chloride: 102 mmol/L (ref 96–106)
Creatinine, Ser: 0.7 mg/dL (ref 0.57–1.00)
Glucose: 237 mg/dL — ABNORMAL HIGH (ref 70–99)
Potassium: 4.3 mmol/L (ref 3.5–5.2)
Sodium: 137 mmol/L (ref 134–144)
eGFR: 97 mL/min/1.73 (ref >59)

## 2024-01-21 LAB — VITAMIN B12: Vitamin B-12: 625 pg/mL (ref 232–1245)

## 2024-01-21 LAB — TSH RFX ON ABNORMAL TO FREE T4: TSH: 3.08 u[IU]/mL (ref 0.450–4.500)

## 2024-01-21 NOTE — Telephone Encounter (Signed)
 Attempted to call patient regarding labs. All labs mostly stable. Still has elevated glucose on CMP as expected by her A1c. She had elevated microalbumin/creatinine and more elevated compared to prior. Jardiance would be better compared to januvia  which she is on but patient has had yeast infections with this before. Continued treatment of diabetes (and recent initiation of ozempic) will hopefully bring this down.

## 2024-01-27 ENCOUNTER — Ambulatory Visit: Admitting: Family Medicine

## 2024-01-27 VITALS — BP 121/76 | HR 74 | Temp 98.4°F | Ht 67.0 in | Wt 190.2 lb

## 2024-01-27 DIAGNOSIS — R051 Acute cough: Secondary | ICD-10-CM | POA: Diagnosis not present

## 2024-01-27 MED ORDER — FLUTICASONE PROPIONATE 50 MCG/ACT NA SUSP: 2.0000 | Freq: Every day | NASAL | 6 refills | Status: AC

## 2024-01-27 MED ORDER — CETIRIZINE HCL 10 MG PO TABS: 10.0000 mg | ORAL_TABLET | Freq: Every day | ORAL | 11 refills | Status: AC

## 2024-01-27 NOTE — Patient Instructions (Addendum)
 It was great to see you! Thank you for allowing me to participate in your care!  Our plans for today:   VISIT SUMMARY: You visited us  today due to a dry cough and sore throat, likely caused by your seasonal allergies and recent exposure to dust.  YOUR PLAN: ALLERGIC RHINITIS WITH COUGH: Your dry cough and sore throat are likely due to your seasonal allergies and exposure to dust. -Use Flonase  nasal spray once daily in both nostrils. Tilt your head back after applying. -Take cetirizine (Zyrtec) daily to help with allergies and congestion. -Use cough drops as needed for relief. -Follow up if your cough persists beyond one week for further evaluation.   Please arrive 15 minutes PRIOR to your next scheduled appointment time! If you do not, this affects OTHER patients' care.  Take care and seek immediate care sooner if you develop any concerns.   Ozell Provencal, MD, PGY-3 [facility] 10:37 AM 01/27/2024  [facility]

## 2024-01-27 NOTE — Progress Notes (Signed)
    SUBJECTIVE:   CHIEF COMPLAINT / HPI: cough  Discussed the use of AI scribe software for clinical note transcription with the patient, who gave verbal consent to proceed.  History of Present Illness Lori Jones is a 63 year old female with seasonal allergies who presents with a dry cough.  Cough and upper respiratory symptoms - Dry cough began on Friday after exposure to dust and sand outdoors on Wednesday and Thursday without a mask - Sore throat present, consistent with prior episodes of illness - No fever, runny nose, nausea, or stomach pain - Minimal occasional nose blowing  Allergic rhinitis and environmental exposures - History of seasonal allergies triggered by dust and similar irritants - No regular use of allergy medications recently; previously used nasal sprays - Sleeps with air conditioning and fan on - Resided in [place]  for 26 years; previously experienced morning nasal congestion while living in [place]    PERTINENT  PMH / PSH: GERD, AAA, T2DM, Hypothyroidism, Bilateral pelvic kidneys  OBJECTIVE:   BP 121/76   Pulse 74   Temp 98.4 F (36.9 C)   Ht 5' 7 (1.702 m)   Wt 190 lb 3.2 oz (86.3 kg)   SpO2 100%   BMI 29.79 kg/m   Physical Exam General: NAD, well appearing Neuro: A&O HEENT: Normal oropharynx, bilateral nasal turbinates mild erythema, swollen CHEST: Clear to auscultation bilaterally, no wheezes, normal WOB on RA CARDIOVASCULAR: Normal heart rate and rhythm, S1 and S2 normal without murmurs. Extremities: Moving all 4 extremities equally   ASSESSMENT/PLAN:   Assessment & Plan Acute cough Allergic rhinitis with cough Dry cough likely due to allergic rhinitis with post-nasal drip, exacerbated by dust exposure. Viral infection less likely. - Prescribed Flonase  nasal spray, once daily in both nostrils, with head tilted back post-application. - Recommended cetirizine (Zyrtec) daily for allergies and congestion. - Advised use of  cough drops as needed for symptomatic relief. - Instructed to follow up if cough persists beyond one week for further evaluation.    Return if symptoms worsen or fail to improve.  Ozell Provencal, MD, PGY-3 [facility] 10:37 AM 01/27/2024  [facility]

## 2024-02-19 ENCOUNTER — Encounter: Payer: Self-pay | Admitting: Family Medicine

## 2024-02-19 ENCOUNTER — Ambulatory Visit: Admitting: Family Medicine

## 2024-02-19 VITALS — BP 125/77 | HR 71 | Ht 67.0 in | Wt 190.2 lb

## 2024-02-19 DIAGNOSIS — Z0000 Encounter for general adult medical examination without abnormal findings: Secondary | ICD-10-CM

## 2024-02-19 DIAGNOSIS — E089 Diabetes mellitus due to underlying condition without complications: Secondary | ICD-10-CM

## 2024-02-19 NOTE — Progress Notes (Signed)
 Asked by Dr. Nicholas to educate patient on use of Ozempic (semaglutide) 0.25mg  once weekly.  Patient educated on purpose, proper use and potential adverse effects of nausea. Following instruction patient verbalized understanding of treatment plan. Patient was able to demonstrate appropriate technique while administering first dose in office

## 2024-02-19 NOTE — Progress Notes (Signed)
   SUBJECTIVE:   CHIEF COMPLAINT / HPI:  Discussed the use of AI scribe software for clinical note transcription with the patient, who gave verbal consent to proceed.  History of Present Illness Lori Jones is a 63 year old female with diabetes who presents for follow-up on her medication regimen.  Diabetes mellitus management - Has not started new ozempic  due to concerns about potential ocular side effects and seeks guidance on administration Also did not start ozempic  due to concerns in how to administer the injection   Nutritional modification - Actively working on improving nutrition - Scheduled appointment with nutritionist for the following day    PERTINENT  PMH / PSH: Diabetes type 2   OBJECTIVE:  BP 125/77   Pulse 71   Ht 5' 7 (1.702 m)   Wt 190 lb 4 oz (86.3 kg)   SpO2 97%   BMI 29.80 kg/m   General: well appearing, in no acute distress Resp: Normal work of breathing on room air Neuro: Alert & Oriented   ASSESSMENT/PLAN:   Assessment & Plan Diabetes mellitus due to underlying condition without complication, without long-term current use of insulin  (HCC) Uncontrolled with A1c of 9.7. Currently prescribed ozempic , metformin  and januvia . Though has not yet started ozempic .  - Counseled regarding ocular side effects of ozempic  and counseled regarding risk and benefits of medications given possibility retinopathy with diabetes  - Patient following with eye doctor  - Pharmacy team came into the room and educated patient on administration of ozempic .  - Continue to meet with nutritionist - Follow up in 2-3 months     Areta Saliva, MD [facility]

## 2024-02-20 ENCOUNTER — Encounter: Attending: Family Medicine | Admitting: Dietician

## 2024-02-20 ENCOUNTER — Encounter: Payer: Self-pay | Admitting: Dietician

## 2024-02-20 VITALS — Wt 189.7 lb

## 2024-02-20 DIAGNOSIS — E119 Type 2 diabetes mellitus without complications: Secondary | ICD-10-CM | POA: Insufficient documentation

## 2024-02-20 NOTE — Progress Notes (Signed)
 Diabetes Self-Management Education  Visit Type: First/Initial  Appt. Start Time: 1000 Appt. End Time: 1100  02/20/2024  [name], identified by name and date of birth, is a 63 y.o. female with a diagnosis of Diabetes: Type 2.   ASSESSMENT  History includes: reviewed; type 2 diabetes, allergies, anemia, anxiety, arthritis, asthma, depression, HLD, HTN, kidney disease, stroke, thyroid  disease Labs noted: reviewed; 01/20/24: A1c 9.7% Medications include: reviewed; januvia , metformin , semaglutide , levothyroxine  Supplements: elderberry, ginger, turmeric, MVI  Pt reports she started ozempic  yesterday.  Pt states in the past her A1c was below 7 and she feels like she was walking more, had less stress, and was eating more consistently.   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   Pt reports she feels she waits too long to eat sometimes, and then over eats.   Pt reports she was attacked by a dog in 2012, which makes her extra cautious about walking. Pt reports she really enjoys walking but she does not feels safe walking in certain areas due to this. Pt reports she is currently walking 4 days per week but wants to increase.   Pt states she checks her blood glucose infrequently.   Pt reports since June she feels she has been eating more sweets. Pt reports she is going to be doing a fast from sweets in the month of December.   Weight 189 lb 11.2 oz (86 kg). Body mass index is 29.71 kg/m.   Diabetes Self-Management Education - 02/16/24 0934       Visit Information   Visit Type First/Initial      Initial Visit   Diabetes Type Type 2      Psychosocial Assessment   Patient Belief/Attitude about Diabetes Motivated to manage diabetes    What is the hardest part about your diabetes right now, causing you the most concern, or is the most worrisome to you about your diabetes?   Making healty food and beverage choices     Self-care barriers None    Self-management support Doctor's office    Other persons present Patient    Patient Concerns Nutrition/Meal planning    Special Needs None    Preferred Learning Style No preference indicated    Learning Readiness Ready    How often do you need to have someone help you when you read instructions, pamphlets, or other written materials from your doctor or pharmacy? 1 - Never      Pre-Education Assessment   Patient understands the diabetes disease and treatment process. Needs Instruction    Patient understands incorporating nutritional management into lifestyle. Needs Instruction    Patient undertands incorporating physical activity into lifestyle. Needs Instruction    Patient understands using medications safely. Needs Instruction    Patient understands monitoring blood glucose, interpreting and using results Needs Instruction    Patient understands prevention, detection, and treatment of acute complications. Needs Instruction    Patient understands prevention, detection, and treatment of chronic complications. Needs Instruction    Patient understands how to develop strategies to address psychosocial issues. Needs Instruction    Patient understands how to develop strategies to promote health/change behavior. Needs Instruction      Complications   Last HgB A1C per patient/outside source 9.7 %    How often do you check your blood sugar? 1-2 times/day    Are you checking your feet? N/A      Dietary Intake   Breakfast 2 frozen  waffles with syrup, 1 egg, and instant oatmeal    Snack (morning) none    Lunch peanut butter and jelly sandwich    Snack (afternoon) none    Dinner red beans and rice, greens, cornbread    Snack (evening) chocolate pudding    Beverage(s) 64 oz water,      Activity / Exercise   Activity / Exercise Type Light (walking / raking leaves)      Patient Education   Disease Pathophysiology Definition of diabetes, type 1 and 2, and the diagnosis  of diabetes;Explored patient's options for treatment of their diabetes;Factors that contribute to the development of diabetes    Healthy Eating Role of diet in the treatment of diabetes and the relationship between the three main macronutrients and blood glucose level;Plate Method;Reviewed blood glucose goals for pre and post meals and how to evaluate the patients' food intake on their blood glucose level.;Meal options for control of blood glucose level and chronic complications.;Information on hints to eating out and maintain blood glucose control.    Being Active Helped patient identify appropriate exercises in relation to his/her diabetes, diabetes complications and other health issue.;Role of exercise on diabetes management, blood pressure control and cardiac health.    Medications Reviewed patients medication for diabetes, action, purpose, timing of dose and side effects.    Monitoring Identified appropriate SMBG and/or A1C goals.    Chronic complications Relationship between chronic complications and blood glucose control;Identified and discussed with patient  current chronic complications    Diabetes Stress and Support Identified and addressed patients feelings and concerns about diabetes;Role of stress on diabetes    Lifestyle and Health Coping Lifestyle issues that need to be addressed for better diabetes care      Individualized Goals (developed by patient)   Nutrition General guidelines for healthy choices and portions discussed    Physical Activity Exercise 3-5 times per week;30 minutes per day    Medications take my medication as prescribed    Monitoring  Test my blood glucose as discussed    Problem Solving Eating Pattern    Reducing Risk examine blood glucose patterns;do foot checks daily;treat hypoglycemia with 15 grams of carbs if blood glucose less than 70mg /dL    Health Coping Ask for help with psychological, social, or emotional issues      Post-Education Assessment   Patient  understands the diabetes disease and treatment process. Comprehends key points    Patient understands incorporating nutritional management into lifestyle. Comprehends key points    Patient undertands incorporating physical activity into lifestyle. Comprehends key points    Patient understands using medications safely. Comphrehends key points    Patient understands monitoring blood glucose, interpreting and using results Comprehends key points    Patient understands prevention, detection, and treatment of acute complications. Comprehends key points    Patient understands prevention, detection, and treatment of chronic complications. Comprehends key points    Patient understands how to develop strategies to address psychosocial issues. Comprehends key points    Patient understands how to develop strategies to promote health/change behavior. Comprehends key points      Outcomes   Expected Outcomes Demonstrated interest in learning. Expect positive outcomes    Future DMSE 2 months    Program Status Not Completed          Individualized Plan for Diabetes Self-Management Training:   Learning Objective:  Patient will have a greater understanding of diabetes self-management. Patient education plan is to attend individual and/or group sessions per  assessed needs and concerns.   Plan:   Patient Instructions  Goals Established by Patient:   Goal 1: at breakfast: add splenda to your oatmeal instead of sugar. Buy lower sugar quaker packets. Add peanut butter or sugar free syrup to the waffles. Try kodiak waffles.   Goal 2: eat dinner by 6:30pm, aim to have more non-starchy vegetables than carbs.   Goal 3: go walking 5 days a week for at least 40 minutes.  Expected Outcomes:  Demonstrated interest in learning. Expect positive outcomes  Education material provided: ADA - How to Thrive: A Guide for Your Journey with Diabetes and My Plate  If problems or questions, patient to contact team via:   Phone  Future DSME appointment: 2 months

## 2024-02-20 NOTE — Patient Instructions (Signed)
 It was wonderful to see you today.  Please bring ALL of your medications with you to every visit.   Today we talked about:  Medications - Ozempic  use and instructions were explained with help of the pharmacy team.   Please follow up in 3 months   Thank you for choosing [facility].   Please call [phone] with any questions about today's appointment.  Areta Saliva, MD  Family Medicine

## 2024-02-20 NOTE — Patient Instructions (Signed)
 Goals Established by Patient:   Goal 1: at breakfast: add splenda to your oatmeal instead of sugar. Buy lower sugar quaker packets. Add peanut butter or sugar free syrup to the waffles. Try kodiak waffles.   Goal 2: eat dinner by 6:30pm, aim to have more non-starchy vegetables than carbs.   Goal 3: go walking 5 days a week for at least 40 minutes.

## 2024-03-17 NOTE — Progress Notes (Signed)
 Lori Jones                                          MRN: [id number]   03/17/2024   The [facility] Quality Team Specialist reviewed this patient medical record for the purposes of chart review for care gap closure. The following were reviewed: chart review for care gap closure-glycemic status assessment.    [facility] Quality Team

## 2024-04-08 ENCOUNTER — Other Ambulatory Visit: Payer: Self-pay | Admitting: Family Medicine

## 2024-04-08 DIAGNOSIS — E089 Diabetes mellitus due to underlying condition without complications: Secondary | ICD-10-CM

## 2024-04-09 ENCOUNTER — Ambulatory Visit: Payer: Self-pay | Admitting: Family Medicine

## 2024-04-09 ENCOUNTER — Encounter: Payer: Self-pay | Admitting: Family Medicine

## 2024-04-09 VITALS — BP 112/68 | HR 87 | Ht 67.0 in | Wt 188.2 lb

## 2024-04-09 DIAGNOSIS — M549 Dorsalgia, unspecified: Secondary | ICD-10-CM | POA: Diagnosis not present

## 2024-04-09 DIAGNOSIS — E119 Type 2 diabetes mellitus without complications: Secondary | ICD-10-CM | POA: Diagnosis not present

## 2024-04-09 DIAGNOSIS — E089 Diabetes mellitus due to underlying condition without complications: Secondary | ICD-10-CM

## 2024-04-09 LAB — POCT GLYCOSYLATED HEMOGLOBIN (HGB A1C): HbA1c, POC (controlled diabetic range): 8.7 % — AB (ref 0.0–7.0)

## 2024-04-09 NOTE — Patient Instructions (Addendum)
 It was wonderful to see you today.  Please bring ALL of your medications with you to every visit.   You are doing great! Make sure you eat enough protein and do weight bearing or resistance exercises to ensure you keep as much muscle as possible. Make sure you get your eyes examined.   Continue 0.25 weekly of your ozempic  and follow up in 3 months.  Thank you for choosing [facility].   Please call [phone] with any questions about today's appointment.  Please be sure to schedule follow up at the front desk before you leave today.   Areta Saliva, MD  Family Medicine

## 2024-04-09 NOTE — Progress Notes (Signed)
" ° °  SUBJECTIVE:   CHIEF COMPLAINT / HPI:  Discussed the use of AI scribe software for clinical note transcription with the patient, who gave verbal consent to proceed.  History of Present Illness Lori Jones is a 64 year old female with type 2 diabetes who presents for follow-up on her diabetes management.  Glycemic control and diabetes management - Type 2 diabetes mellitus. - Currently using Ozempic  0.25 mg weekly. - Last Ozempic  pen was empty when attempting to administer dose. - A1c improved by 1 point to 8.7. - Weight loss from 188 lbs to 183 lbs, with visible reduction around waist and face. - No adverse effects from Ozempic . - Prefers to remain at current Ozempic  dose.  Middle/ Lower back pain. -Started having back pain the beginning of this week after going to a conference where she was jumping a lot. - Has improved with heating pad patch - No weakness or symptoms going down her legs.     PERTINENT  PMH / PSH: T2DM  OBJECTIVE:  BP 112/68   Pulse 87   Ht 5' 7 (1.702 m)   Wt 188 lb 3.2 oz (85.4 kg)   SpO2 98%   BMI 29.48 kg/m   Physical Exam MEASUREMENTS: Weight- 188.  General: well appearing, in no acute distress CV: RRR, radial pulses equal and palpable, no BLE edema  Resp: Normal work of breathing on room air, CTAB MSK: Left mid lower back paravertebral musculature tense and contracted warm comparatively.  This area is tender to palpation. Neuro: Alert & Oriented x 4, strength equal in all extremities, sensation normal in all extremities  ASSESSMENT/PLAN:   Assessment & Plan Diabetes mellitus due to underlying condition without complication, without long-term current use of insulin  (HCC) Diabetes management with Ozempic  effective, showing weight loss and A1c reduction. Patient prefers current dose for gradual progress. - Continue Ozempic  at 0.25 mg dose. - Encouraged continued weight loss and dietary management. - Advised on increasing protein intake and  engaging in strength exercises to prevent muscle loss. - Recommended eye exam which patient has scheduled for next week. Mid back pain on left side Most likely musculoskeletal given evidence of contracted musculature. - Recommended heating pad to this area, lidocaine  patches as well. - Continue to monitor if worsens recommended follow-up.   Areta Saliva, MD [facility] "

## 2024-04-12 LAB — OPHTHALMOLOGY REPORT-SCANNED

## 2024-04-16 ENCOUNTER — Encounter: Payer: Self-pay | Admitting: Dietician

## 2024-04-16 ENCOUNTER — Encounter: Attending: Family Medicine | Admitting: Dietician

## 2024-04-16 VITALS — Wt 190.8 lb

## 2024-04-16 DIAGNOSIS — E119 Type 2 diabetes mellitus without complications: Secondary | ICD-10-CM | POA: Diagnosis present

## 2024-04-16 NOTE — Progress Notes (Signed)
 " Diabetes Self-Management Education  Visit Type: Follow-up  Appt. Start Time: 1138 Appt. End Time: 1318  04/16/2024  [name], identified by name and date of birth, is a 64 y.o. female with a diagnosis of Diabetes: Type 2.   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  Pt states she has been using stevia in her oatmeal instead of table sugar. Pt states she can tell when things are too sweet. Pt states she also tried toast and peanut butter for breakfast.   Pt states she is still on ozempic  and has felt that she lost weight loss in her face and midsection. She reports appetite changes and has been stopping eating when she feels full.  Pt reports she occasionally skips lunch and will drink protein shakes.   Pt reports she is going on a church fast at the end of January. Pt reports she will wake up earlier to take her medicine and eat, drink protein shakes during the day and then eat dinner.    Pt reports she has a dental exam soon.   Pt states she has not been walking because it has been too cold.   Pt reports stressors such as her living situation, her dental health, difficulties with medical visits.   At this visit pt disclosed an uncomfortable experience with a provider. This RD assisted in making a Complaint Report to the [place]  [employer].   Assessment of Previous Goals Established by Patient:   Goal 1: at breakfast: add splenda to your oatmeal instead of sugar. Buy lower sugar quaker packets. Add peanut butter or sugar free syrup to the waffles. Try kodiak waffles. Goal met: Continue!   Goal 2: eat dinner by 6:30pm,  aim to have more non-starchy vegetables than carbs. Goal not met: continue working   Goal 3: go walking 5 days a week for at least 40 minutes. - goal in progress, continue.   Weight 190.8 lb   Diabetes Self-Management Education - 04/16/24 1137       Visit Information   Visit Type Follow-up      Initial Visit   Diabetes Type Type 2    Are you currently following a meal plan? No      Psychosocial Assessment   Patient Belief/Attitude about Diabetes Motivated to manage diabetes    What is the hardest part about your diabetes right now, causing you the most concern, or is the most worrisome to you about your diabetes?   Making healty food and beverage choices;Getting support / problem solving    Self-care barriers None    Self-management support Doctor's office;Church;Friends    Other persons present Patient    Patient Concerns Nutrition/Meal planning;Healthy Lifestyle    Special Needs None    Preferred Learning Style No preference indicated    Learning Readiness Ready      Pre-Education Assessment   Patient understands the diabetes disease and treatment process. Needs Review    Patient understands incorporating nutritional management into lifestyle. Needs Review    Patient undertands incorporating physical activity into lifestyle. Needs Review    Patient understands using medications safely. Needs Review    Patient understands monitoring blood glucose, interpreting and using results Needs Review    Patient  understands prevention, detection, and treatment of acute complications. Needs Review    Patient understands prevention, detection, and treatment of chronic complications. Needs Review    Patient understands how to develop strategies to address psychosocial issues. Needs Review    Patient understands how to develop strategies to promote health/change behavior. Needs Review      Complications   Last HgB A1C per patient/outside source 8.7 %    How often do you check your blood  sugar? 1-2 times/day    Have you had a dilated eye exam in the past 12 months? Yes    Have you had a dental exam in the past 12 months? Yes      Dietary Intake   Breakfast oatmeal with splenda    Lunch peanut butter and jelly sandwich on wheat    Dinner red beans and rice with cornbread and green beans    Beverage(s) water      Activity / Exercise   Activity / Exercise Type Light (walking / raking leaves)      Patient Education   Previous Diabetes Education Yes    Disease Pathophysiology Explored patient's options for treatment of their diabetes;Factors that contribute to the development of diabetes    Healthy Eating Role of diet in the treatment of diabetes and the relationship between the three main macronutrients and blood glucose level;Plate Method;Meal options for control of blood glucose level and chronic complications.    Medications Reviewed patients medication for diabetes, action, purpose, timing of dose and side effects.    Monitoring Identified appropriate SMBG and/or A1C goals.    Chronic complications Relationship between chronic complications and blood glucose control;Identified and discussed with patient  current chronic complications    Diabetes Stress and Support Identified and addressed patients feelings and concerns about diabetes;Worked with patient to identify barriers to care and solutions;Brainstormed with patient on coping mechanisms for social situations, getting support from significant others, dealing with feelings about diabetes    Lifestyle and Health Coping Lifestyle issues that need to be addressed for better diabetes care      Individualized Goals (developed by patient)   Nutrition General guidelines for healthy choices and portions discussed    Physical Activity Exercise 3-5 times per week;30 minutes per day    Medications take my medication as prescribed    Monitoring  Test my blood glucose as discussed    Problem Solving Sleep Pattern    Reducing Risk  examine blood glucose patterns;do foot checks daily;treat hypoglycemia with 15 grams of carbs if blood glucose less than 70mg /dL    Health Coping Ask for help with psychological, social, or emotional issues      Patient Self-Evaluation of Goals - Patient rates self as meeting previously set goals (% of time)   Nutrition 50 - 75 % (half of the time)    Physical Activity 50 - 75 % (half of the time)    Medications 50 - 75 % (half of the time)    Monitoring 50 - 75 % (half of the time)    Problem Solving and behavior change strategies  50 - 75 % (half of the time)    Reducing Risk (treating acute and chronic complications) 50 - 75 % (half of the time)    Health Coping 50 - 75 % (half of the time)      Post-Education Assessment   Patient understands the diabetes disease and treatment process. Comprehends key points    Patient understands incorporating nutritional management  into lifestyle. Comprehends key points    Patient undertands incorporating physical activity into lifestyle. Comprehends key points    Patient understands using medications safely. Comphrehends key points    Patient understands monitoring blood glucose, interpreting and using results Comprehends key points    Patient understands prevention, detection, and treatment of acute complications. Comprehends key points    Patient understands prevention, detection, and treatment of chronic complications. Comprehends key points    Patient understands how to develop strategies to address psychosocial issues. Comprehends key points    Patient understands how to develop strategies to promote health/change behavior. Comprehends key points      Outcomes   Expected Outcomes Demonstrated interest in learning. Expect positive outcomes    Future DMSE 4-6 wks    Program Status Not Completed      Subsequent Visit   Since your last visit have you experienced any weight changes? Gain          Individualized Plan for Diabetes  Self-Management Training:   Learning Objective:  Patient will have a greater understanding of diabetes self-management. Patient education plan is to attend individual and/or group sessions per assessed needs and concerns.   Plan:   There are no Patient Instructions on file for this visit.  Expected Outcomes:  Demonstrated interest in learning. Expect positive outcomes  Education material provided: none  If problems or questions, patient to contact team via:  Phone  Future DSME appointment: 4-6 wks   "

## 2024-05-28 ENCOUNTER — Encounter: Admitting: Dietician

## 2024-10-18 ENCOUNTER — Encounter
